# Patient Record
Sex: Female | Born: 1955
Health system: Southern US, Community
[De-identification: ages and names within clinical notes are randomized; demographics above are authoritative.]

## PROBLEM LIST (undated history)

## (undated) DIAGNOSIS — F329 Major depressive disorder, single episode, unspecified: Secondary | ICD-10-CM

## (undated) DIAGNOSIS — J189 Pneumonia, unspecified organism: Secondary | ICD-10-CM

## (undated) DIAGNOSIS — G629 Polyneuropathy, unspecified: Secondary | ICD-10-CM

## (undated) DIAGNOSIS — K219 Gastro-esophageal reflux disease without esophagitis: Secondary | ICD-10-CM

## (undated) DIAGNOSIS — I10 Essential (primary) hypertension: Secondary | ICD-10-CM

## (undated) DIAGNOSIS — K859 Acute pancreatitis without necrosis or infection, unspecified: Secondary | ICD-10-CM

## (undated) DIAGNOSIS — G35D Multiple sclerosis, unspecified: Secondary | ICD-10-CM

## (undated) DIAGNOSIS — J324 Chronic pansinusitis: Secondary | ICD-10-CM

## (undated) DIAGNOSIS — M5126 Other intervertebral disc displacement, lumbar region: Secondary | ICD-10-CM

## (undated) DIAGNOSIS — R51 Headache: Secondary | ICD-10-CM

## (undated) DIAGNOSIS — G709 Myoneural disorder, unspecified: Secondary | ICD-10-CM

## (undated) DIAGNOSIS — H539 Unspecified visual disturbance: Secondary | ICD-10-CM

## (undated) DIAGNOSIS — D649 Anemia, unspecified: Secondary | ICD-10-CM

## (undated) DIAGNOSIS — M1990 Unspecified osteoarthritis, unspecified site: Secondary | ICD-10-CM

## (undated) DIAGNOSIS — F32A Depression, unspecified: Secondary | ICD-10-CM

## (undated) DIAGNOSIS — F419 Anxiety disorder, unspecified: Secondary | ICD-10-CM

## (undated) DIAGNOSIS — K635 Polyp of colon: Secondary | ICD-10-CM

## (undated) DIAGNOSIS — Z01419 Encounter for gynecological examination (general) (routine) without abnormal findings: Secondary | ICD-10-CM

## (undated) DIAGNOSIS — N319 Neuromuscular dysfunction of bladder, unspecified: Secondary | ICD-10-CM

## (undated) DIAGNOSIS — E042 Nontoxic multinodular goiter: Secondary | ICD-10-CM

## (undated) DIAGNOSIS — K5120 Ulcerative (chronic) proctitis without complications: Secondary | ICD-10-CM

## (undated) DIAGNOSIS — N3281 Overactive bladder: Secondary | ICD-10-CM

## (undated) DIAGNOSIS — G35 Multiple sclerosis: Secondary | ICD-10-CM

## (undated) HISTORY — DX: Encounter for gynecological examination (general) (routine) without abnormal findings: Z01.419

## (undated) HISTORY — DX: Other intervertebral disc displacement, lumbar region: M51.26

## (undated) HISTORY — DX: Unspecified visual disturbance: H53.9

## (undated) HISTORY — DX: Major depressive disorder, single episode, unspecified: F32.9

## (undated) HISTORY — PX: SPINAL FUSION: SHX223

## (undated) HISTORY — DX: Polyp of colon: K63.5

## (undated) HISTORY — DX: Nontoxic multinodular goiter: E04.2

## (undated) HISTORY — DX: Depression, unspecified: F32.A

## (undated) HISTORY — PX: LAPAROSCOPY: SHX197

## (undated) HISTORY — PX: TONSILLECTOMY: SUR1361

## (undated) HISTORY — PX: NASAL TURBINATE REDUCTION: SHX2072

## (undated) HISTORY — DX: Ulcerative (chronic) proctitis without complications: K51.20

## (undated) HISTORY — PX: APPENDECTOMY: SHX54

## (undated) HISTORY — DX: Overactive bladder: N32.81

## (undated) HISTORY — DX: Multiple sclerosis, unspecified: G35.D

## (undated) HISTORY — DX: Neuromuscular dysfunction of bladder, unspecified: N31.9

## (undated) HISTORY — PX: CARPAL TUNNEL RELEASE: SHX101

## (undated) HISTORY — PX: OTHER SURGICAL HISTORY: SHX169

## (undated) HISTORY — PX: CHOLECYSTECTOMY: SHX55

## (undated) HISTORY — DX: Multiple sclerosis: G35

## (undated) HISTORY — DX: Headache: R51

## (undated) HISTORY — DX: Unspecified osteoarthritis, unspecified site: M19.90

## (undated) HISTORY — DX: Gastro-esophageal reflux disease without esophagitis: K21.9

## (undated) HISTORY — DX: Polyneuropathy, unspecified: G62.9

## (undated) HISTORY — DX: Acute pancreatitis without necrosis or infection, unspecified: K85.90

## (undated) HISTORY — PX: EYE SURGERY: SHX253

## (undated) HISTORY — DX: Essential (primary) hypertension: I10

## (undated) HISTORY — PX: DILATION AND CURETTAGE OF UTERUS: SHX78

---

## 1975-03-09 DIAGNOSIS — K859 Acute pancreatitis without necrosis or infection, unspecified: Secondary | ICD-10-CM

## 1975-03-09 HISTORY — DX: Acute pancreatitis without necrosis or infection, unspecified: K85.90

## 1976-03-08 HISTORY — PX: OTHER SURGICAL HISTORY: SHX169

## 1978-03-08 DIAGNOSIS — M51369 Other intervertebral disc degeneration, lumbar region without mention of lumbar back pain or lower extremity pain: Secondary | ICD-10-CM

## 1978-03-08 DIAGNOSIS — M5136 Other intervertebral disc degeneration, lumbar region: Secondary | ICD-10-CM

## 1978-03-08 DIAGNOSIS — M5126 Other intervertebral disc displacement, lumbar region: Secondary | ICD-10-CM

## 1978-03-08 HISTORY — DX: Other intervertebral disc degeneration, lumbar region: M51.36

## 1978-03-08 HISTORY — DX: Other intervertebral disc degeneration, lumbar region without mention of lumbar back pain or lower extremity pain: M51.369

## 1978-03-08 HISTORY — DX: Other intervertebral disc displacement, lumbar region: M51.26

## 1993-03-08 DIAGNOSIS — E042 Nontoxic multinodular goiter: Secondary | ICD-10-CM

## 1993-03-08 HISTORY — PX: VAGINAL HYSTERECTOMY: SUR661

## 1993-03-08 HISTORY — DX: Nontoxic multinodular goiter: E04.2

## 2000-05-07 ENCOUNTER — Encounter: Payer: Self-pay | Admitting: Psychiatry

## 2000-05-07 ENCOUNTER — Ambulatory Visit (HOSPITAL_COMMUNITY): Admission: RE | Admit: 2000-05-07 | Discharge: 2000-05-07 | Payer: Self-pay | Admitting: Psychiatry

## 2000-06-03 ENCOUNTER — Encounter: Admission: RE | Admit: 2000-06-03 | Discharge: 2000-06-28 | Payer: Self-pay | Admitting: Psychiatry

## 2000-10-22 ENCOUNTER — Ambulatory Visit (HOSPITAL_COMMUNITY): Admission: RE | Admit: 2000-10-22 | Discharge: 2000-10-22 | Payer: Self-pay | Admitting: Psychiatry

## 2000-10-22 ENCOUNTER — Encounter: Payer: Self-pay | Admitting: Psychiatry

## 2001-03-21 ENCOUNTER — Other Ambulatory Visit: Admission: RE | Admit: 2001-03-21 | Discharge: 2001-03-21 | Payer: Self-pay | Admitting: Family Medicine

## 2001-09-11 ENCOUNTER — Ambulatory Visit (HOSPITAL_COMMUNITY): Admission: RE | Admit: 2001-09-11 | Discharge: 2001-09-11 | Payer: Self-pay | Admitting: Psychiatry

## 2001-09-11 ENCOUNTER — Encounter: Payer: Self-pay | Admitting: Psychiatry

## 2001-10-13 ENCOUNTER — Encounter (INDEPENDENT_AMBULATORY_CARE_PROVIDER_SITE_OTHER): Payer: Self-pay | Admitting: Specialist

## 2001-10-13 ENCOUNTER — Ambulatory Visit (HOSPITAL_COMMUNITY): Admission: RE | Admit: 2001-10-13 | Discharge: 2001-10-13 | Payer: Self-pay | Admitting: *Deleted

## 2003-06-12 ENCOUNTER — Encounter: Admission: RE | Admit: 2003-06-12 | Discharge: 2003-07-25 | Payer: Self-pay | Admitting: Family Medicine

## 2003-10-07 HISTORY — PX: OTHER SURGICAL HISTORY: SHX169

## 2003-10-31 ENCOUNTER — Emergency Department (HOSPITAL_COMMUNITY): Admission: EM | Admit: 2003-10-31 | Discharge: 2003-10-31 | Payer: Self-pay | Admitting: Emergency Medicine

## 2003-10-31 ENCOUNTER — Ambulatory Visit (HOSPITAL_BASED_OUTPATIENT_CLINIC_OR_DEPARTMENT_OTHER): Admission: RE | Admit: 2003-10-31 | Discharge: 2003-10-31 | Payer: Self-pay | Admitting: Urology

## 2006-12-19 ENCOUNTER — Encounter: Admission: RE | Admit: 2006-12-19 | Discharge: 2006-12-19 | Payer: Self-pay | Admitting: Obstetrics and Gynecology

## 2006-12-19 IMAGING — MG MM SCREEN MAMMOGRAM BILATERAL
4 series · 4 of 4 positions shown · non-contrast
Comparison: none

DG SCREEN MAMMOGRAM BILATERAL
Bilateral CC and MLO view(s) were taken.

DIGITAL SCREENING MAMMOGRAM WITH CAD:
The breast tissue is heterogeneously dense.  No masses or malignant type calcifications are 
identified.  Compared with prior studies.

[R CC]
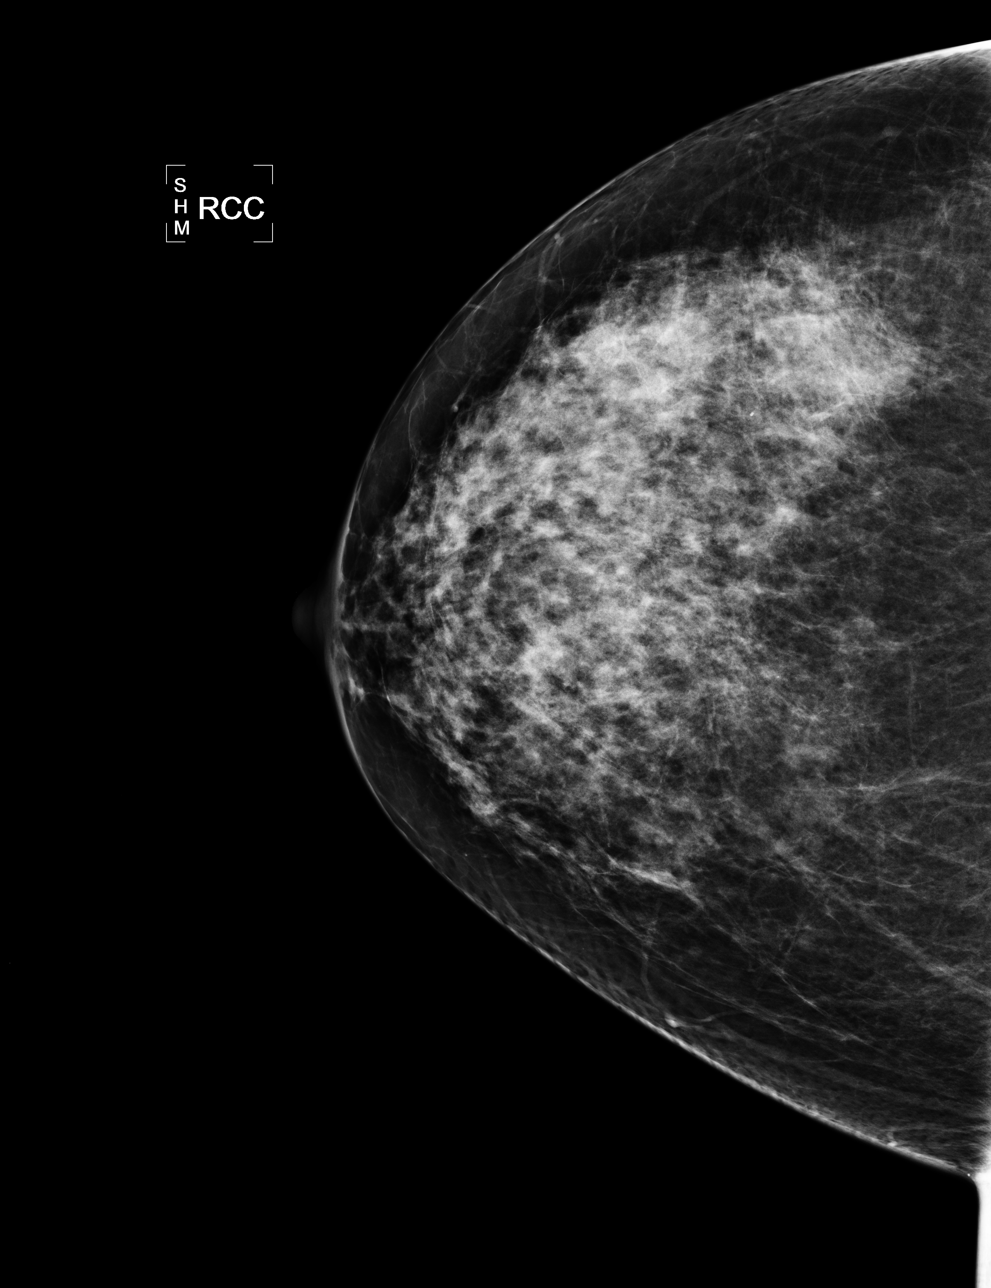

[L CC]
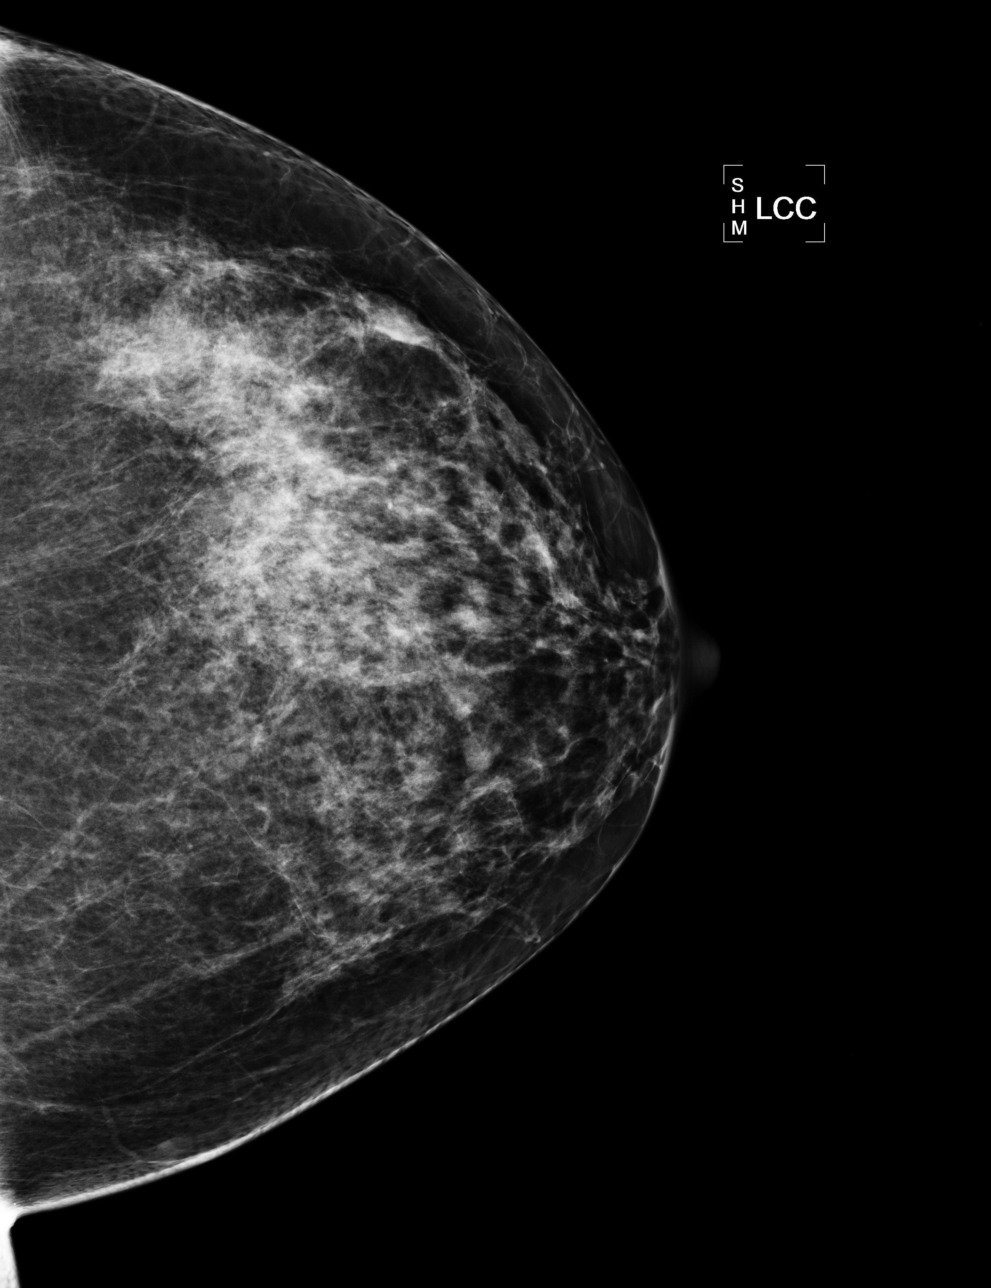

[L MLO]
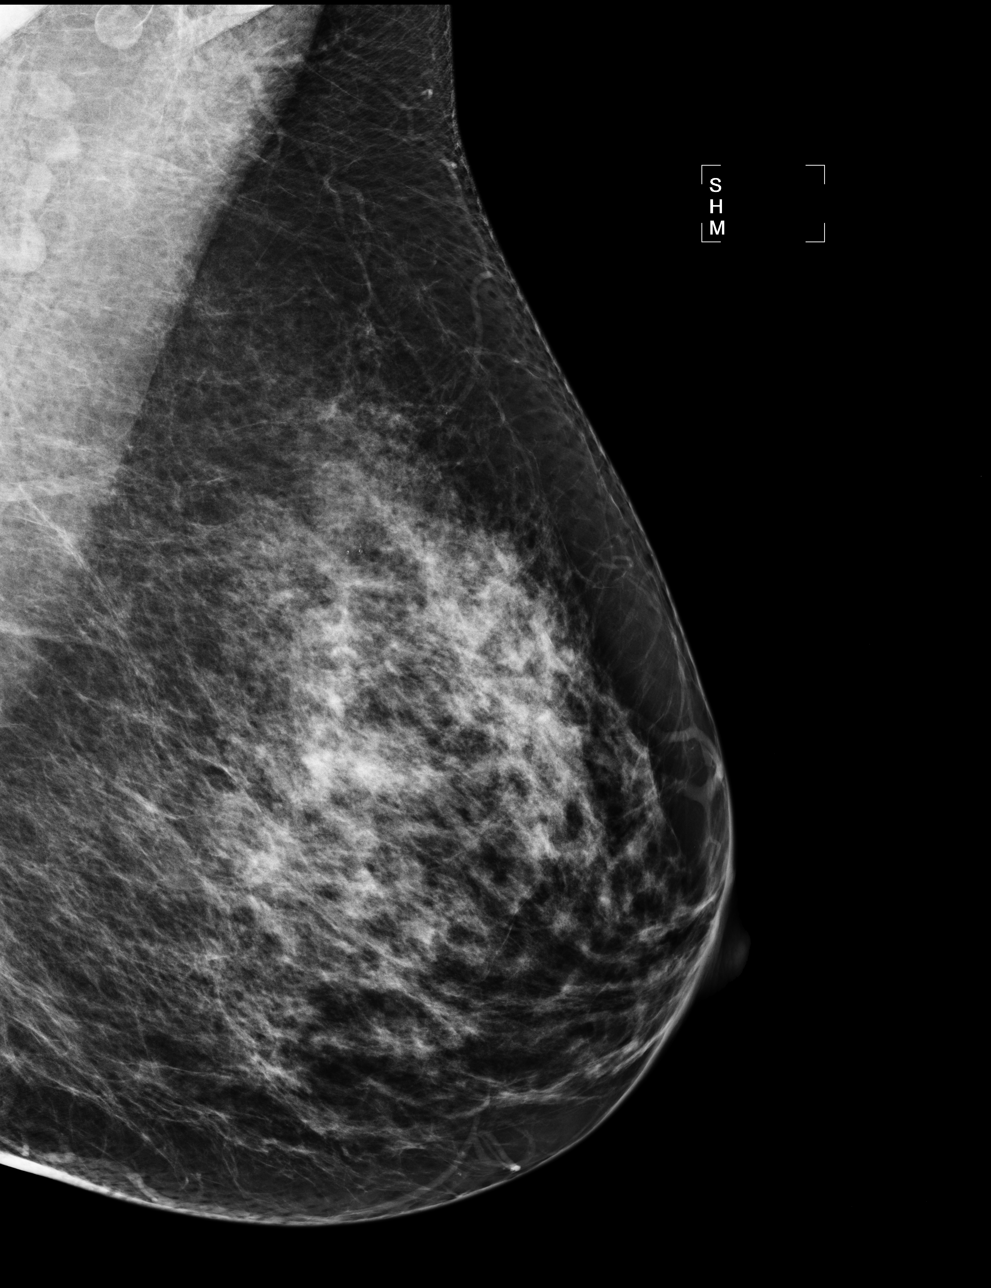

[R MLO]
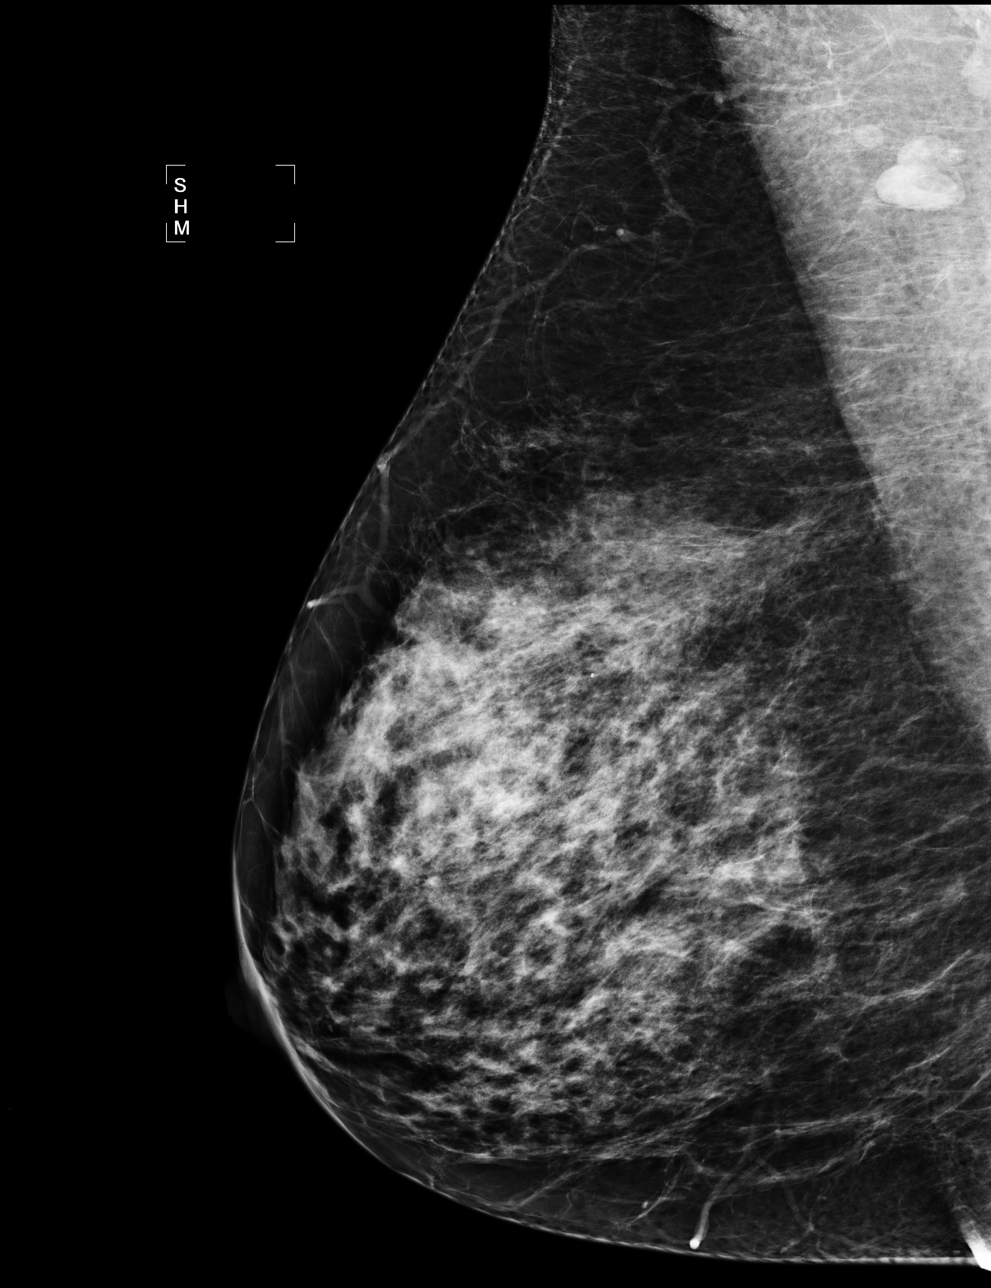

[4 of 4 positions shown; findings below may reference images not displayed]

IMPRESSION: No specific mammographic evidence of malignancy.  Next screening mammogram is recommended in one 
year.

ASSESSMENT: Negative - BI-RADS 1

Screening mammogram in 1 year.
ANALYZED BY COMPUTER AIDED DETECTION. , THIS PROCEDURE WAS A DIGITAL MAMMOGRAM.

## 2007-06-28 ENCOUNTER — Encounter: Payer: Self-pay | Admitting: Family Medicine

## 2007-07-04 ENCOUNTER — Ambulatory Visit: Payer: Self-pay | Admitting: Family Medicine

## 2007-07-04 DIAGNOSIS — Z860100 Personal history of colon polyps, unspecified: Secondary | ICD-10-CM | POA: Insufficient documentation

## 2007-07-04 DIAGNOSIS — M1990 Unspecified osteoarthritis, unspecified site: Secondary | ICD-10-CM | POA: Insufficient documentation

## 2007-07-04 DIAGNOSIS — R519 Headache, unspecified: Secondary | ICD-10-CM | POA: Insufficient documentation

## 2007-07-04 DIAGNOSIS — R51 Headache: Secondary | ICD-10-CM | POA: Insufficient documentation

## 2007-07-04 DIAGNOSIS — G35 Multiple sclerosis: Secondary | ICD-10-CM | POA: Insufficient documentation

## 2007-07-04 DIAGNOSIS — F418 Other specified anxiety disorders: Secondary | ICD-10-CM | POA: Insufficient documentation

## 2007-07-04 DIAGNOSIS — M179 Osteoarthritis of knee, unspecified: Secondary | ICD-10-CM | POA: Insufficient documentation

## 2007-07-06 LAB — CONVERTED CEMR LAB
ALT: 19 units/L (ref 0–35)
AST: 25 units/L (ref 0–37)
Albumin: 4 g/dL (ref 3.5–5.2)
Alkaline Phosphatase: 67 units/L (ref 39–117)
BUN: 8 mg/dL (ref 6–23)
Bilirubin, Direct: 0.1 mg/dL (ref 0.0–0.3)
CO2: 30 meq/L (ref 19–32)
Calcium: 9.3 mg/dL (ref 8.4–10.5)
Cholesterol: 195 mg/dL (ref 0–200)
Creatinine, Ser: 0.8 mg/dL (ref 0.4–1.2)
Eosinophils Relative: 3.4 % (ref 0.0–5.0)
GFR calc Af Amer: 96 mL/min
GFR calc non Af Amer: 80 mL/min
HDL: 36.6 mg/dL — ABNORMAL LOW (ref >39.0)
MCV: 96.9 fL (ref 78.0–100.0)
Monocytes Relative: 31.2 % — ABNORMAL HIGH (ref 3.0–12.0)
Neutro Abs: 0.9 10*3/uL — ABNORMAL LOW (ref 1.4–7.7)
RDW: 11.6 % (ref 11.5–14.6)
Total Bilirubin: 0.6 mg/dL (ref 0.3–1.2)
Total Protein: 7.4 g/dL (ref 6.0–8.3)
Triglycerides: 118 mg/dL (ref 0–149)
VLDL: 24 mg/dL (ref 0–40)

## 2007-07-20 ENCOUNTER — Encounter: Payer: Self-pay | Admitting: Family Medicine

## 2007-08-28 ENCOUNTER — Encounter: Payer: Self-pay | Admitting: Family Medicine

## 2007-08-28 ENCOUNTER — Ambulatory Visit: Payer: Self-pay | Admitting: Internal Medicine

## 2008-01-26 ENCOUNTER — Encounter: Admission: RE | Admit: 2008-01-26 | Discharge: 2008-01-26 | Payer: Self-pay | Admitting: Family Medicine

## 2008-01-26 IMAGING — MG MM SCREEN MAMMOGRAM BILATERAL
4 series · 4 of 4 positions shown · non-contrast
Comparison: none

DG SCREEN MAMMOGRAM BILATERAL
Bilateral CC and MLO view(s) were taken.
Technologist: ALEQSI

DIGITAL SCREENING MAMMOGRAM WITH CAD:
The breast tissue is heterogeneously dense.  A possible mass is noted in the left breast.  Spot 
compression views and possibly sonography are recommended for further evaluation.  In the right 
breast, no masses or malignant type calcifications are identified.  Compared with prior studies.

[R CC]
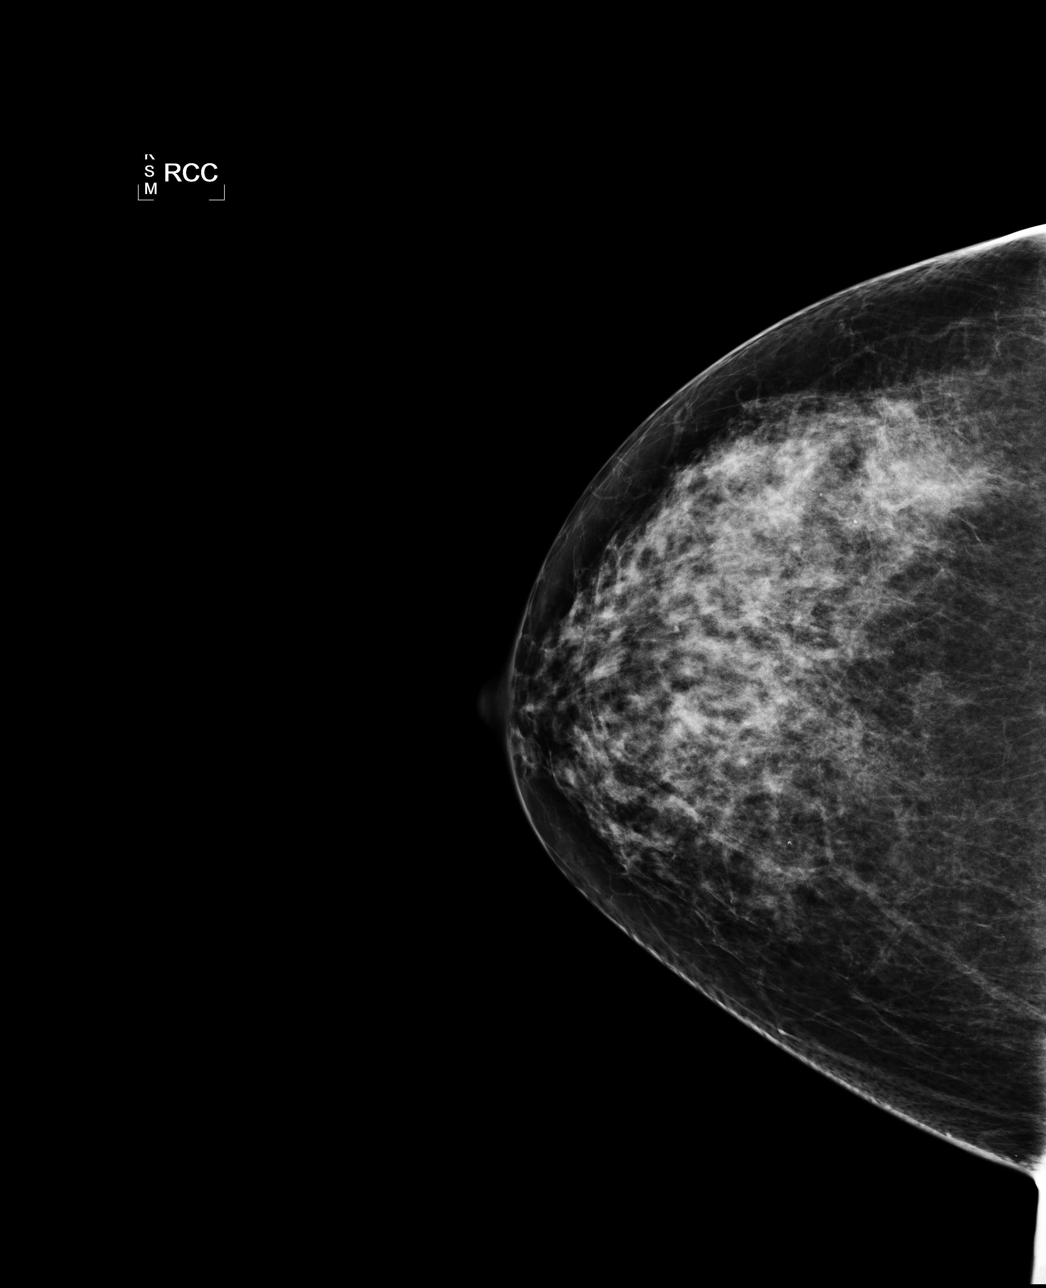

[L CC]
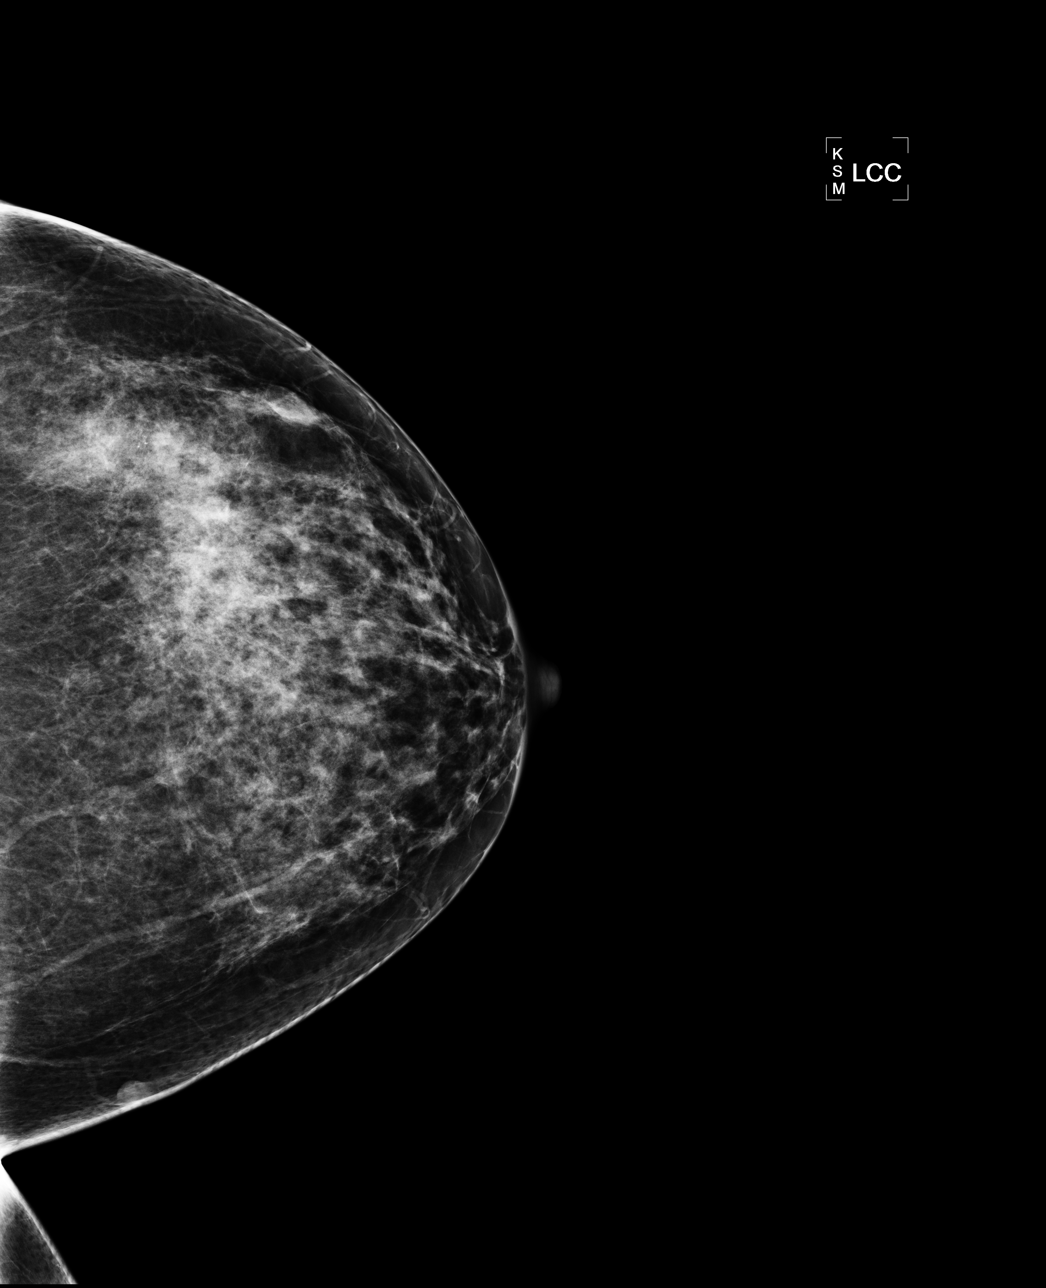

[L MLO]
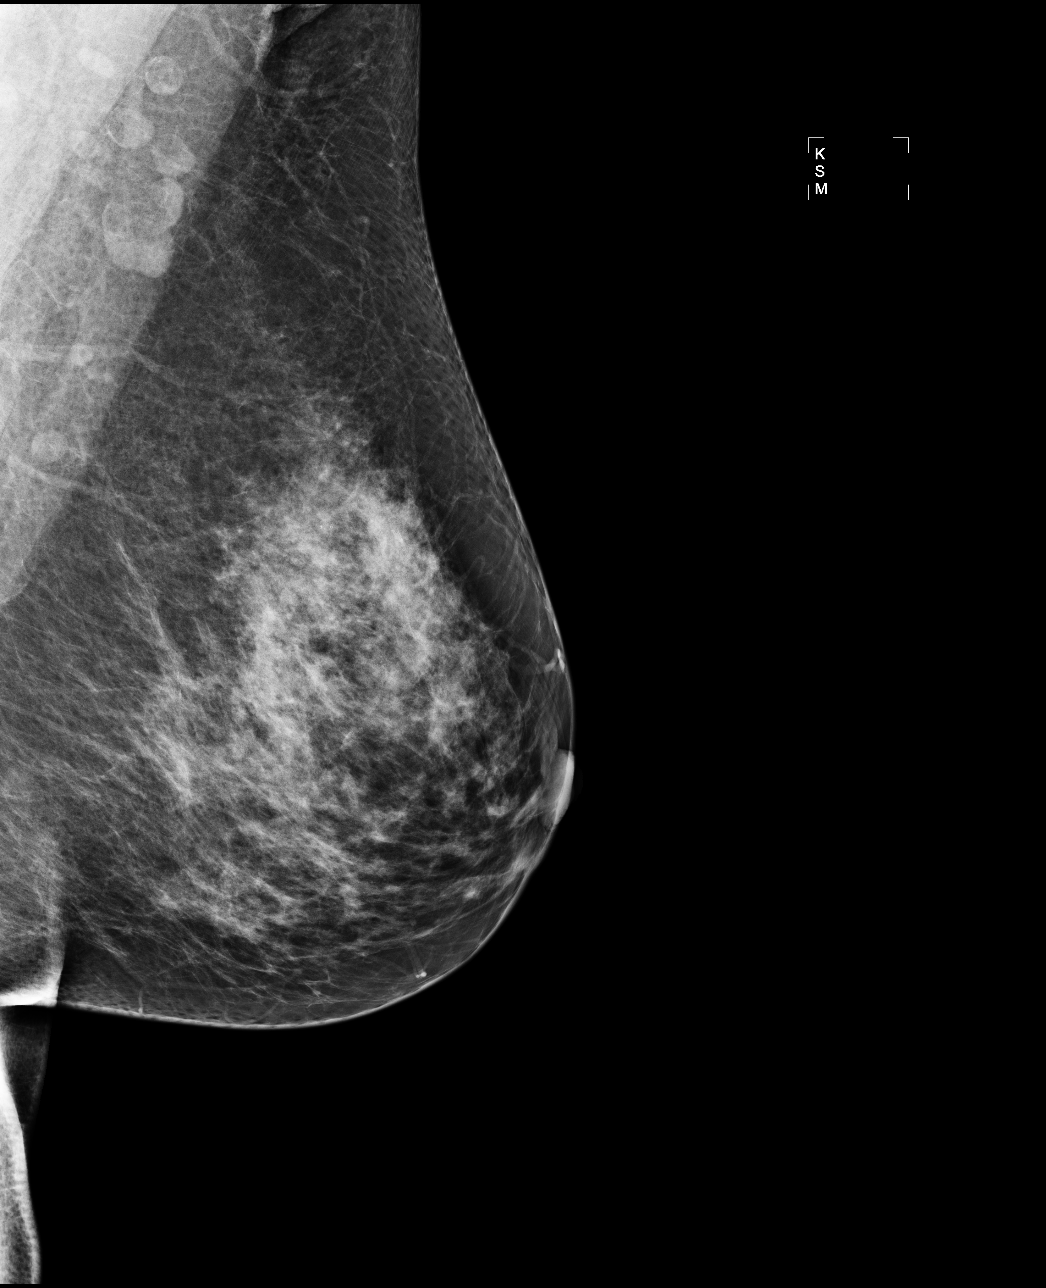

[R MLO]
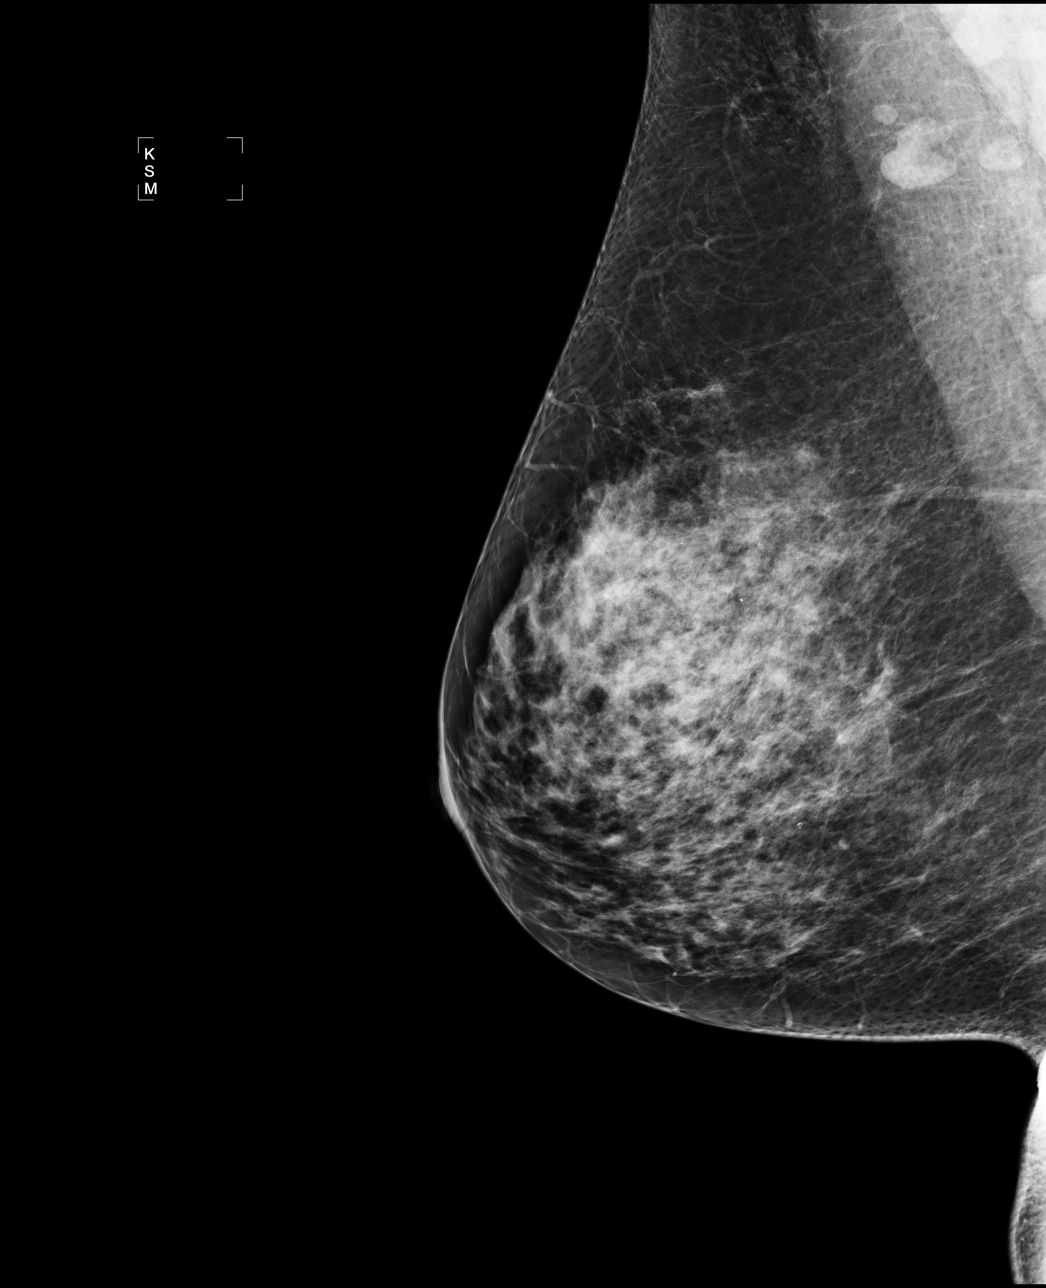

[4 of 4 positions shown; findings below may reference images not displayed]

IMPRESSION: Possible mass, left breast.  Additional evaluation is indicated.  The patient will be contacted for
additional studies and a supplementary report will follow.  No specific mammographic evidence of 
malignancy, right breast.

ASSESSMENT: Need additional imaging evaluation and/or prior mammograms for comparison - BI-RADS 0

Further imaging of the left breast.
ANALYZED BY COMPUTER AIDED DETECTION. , THIS PROCEDURE WAS A DIGITAL MAMMOGRAM.

## 2008-02-07 ENCOUNTER — Encounter: Admission: RE | Admit: 2008-02-07 | Discharge: 2008-02-07 | Payer: Self-pay | Admitting: Family Medicine

## 2008-02-07 IMAGING — MG MM DIGITAL DIAGNOSTIC LIMITED*L*
1 series · 1 of 1 positions shown · non-contrast
Comparison: Prior studies

CLINICAL DATA: Abnormal screening mammogram

DIGITAL DIAGNOSTIC LEFT BREAST MAMMOGRAM

[L TAN]
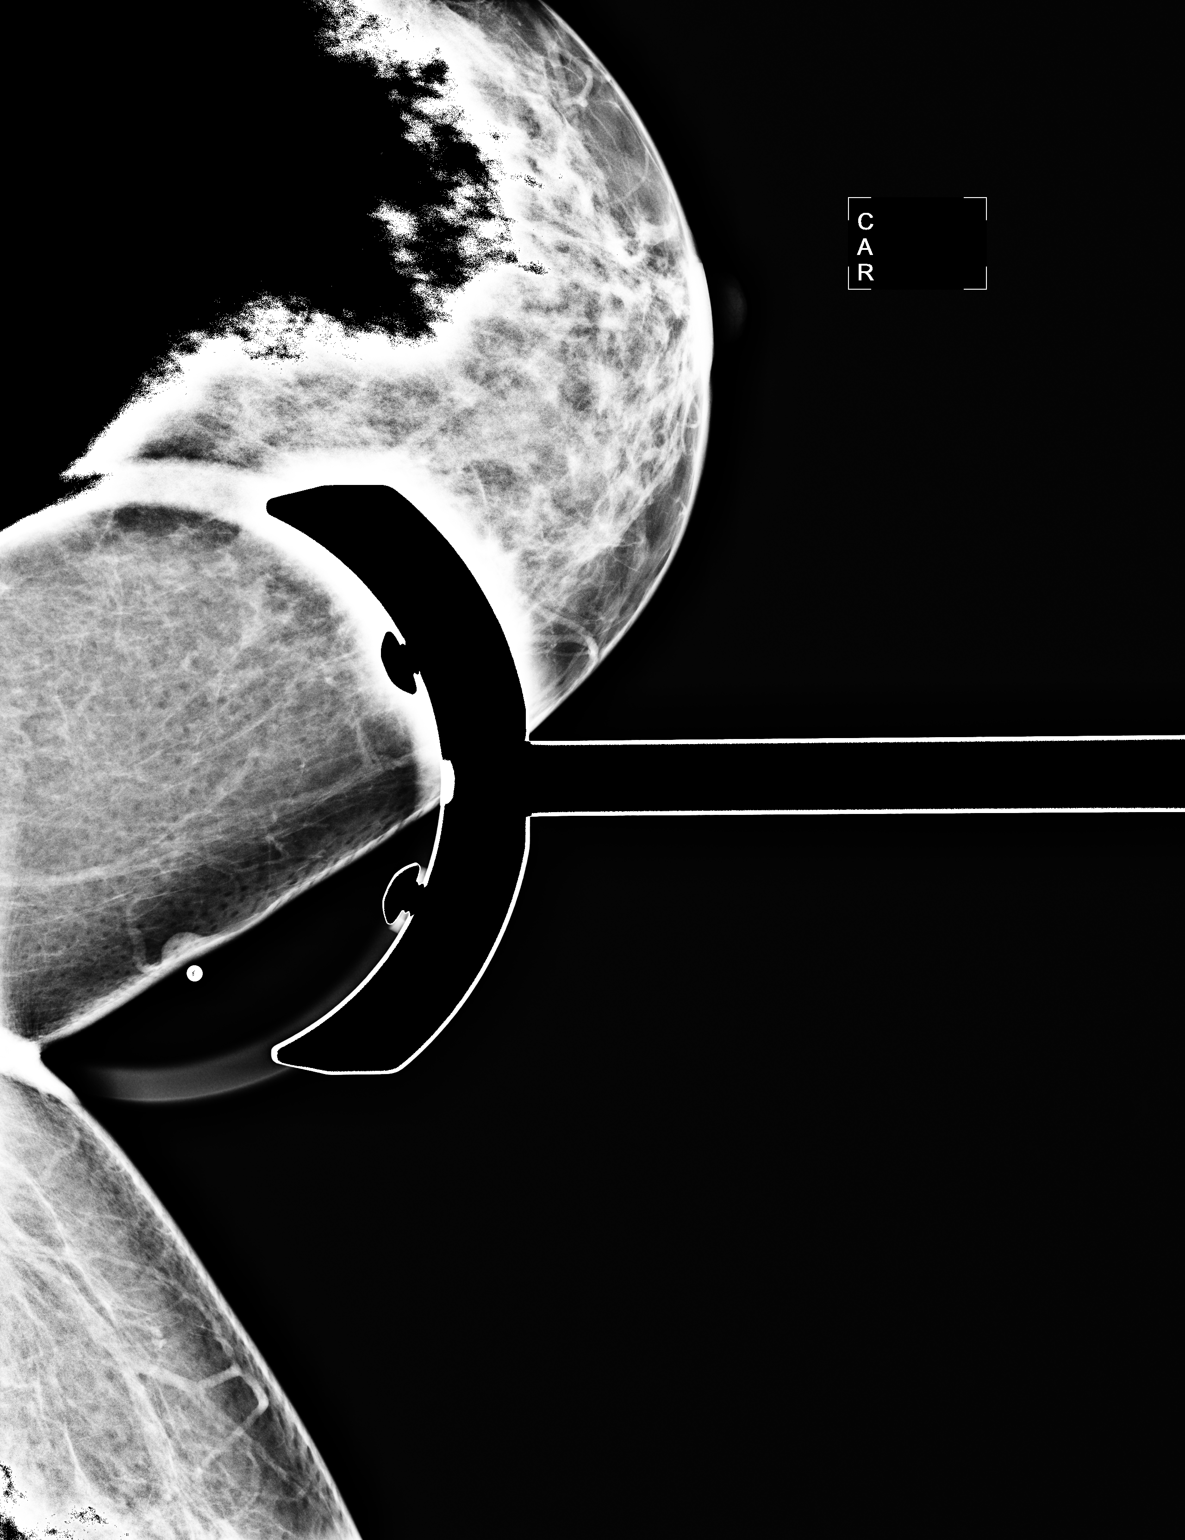

[1 of 1 positions shown; findings below may reference images not displayed]

FINDINGS: A tangential view of the medial left breast was obtained
with a "BB" marking the sebaceous cyst present on physical
examination.  This does demonstrate that the nodule noted medially
within the left breast on screening mammography corresponds to the
sebaceous cyst present on physical examination.  There are no
findings worrisome for malignancy.
IMPRESSION: The small nodular density located medially within the left breast
corresponds to a sebaceous cyst on physical examination.  No
findings worrisome for malignancy.  Recommend return to screening
mammography in 1 year.

BI-RADS CATEGORY 2:  Benign finding(s).

## 2008-02-19 ENCOUNTER — Telehealth: Payer: Self-pay | Admitting: Family Medicine

## 2008-12-19 ENCOUNTER — Ambulatory Visit: Payer: Self-pay | Admitting: Family Medicine

## 2009-01-10 ENCOUNTER — Encounter (INDEPENDENT_AMBULATORY_CARE_PROVIDER_SITE_OTHER): Payer: Self-pay | Admitting: *Deleted

## 2009-01-10 HISTORY — PX: COLONOSCOPY: SHX174

## 2009-02-07 ENCOUNTER — Encounter: Admission: RE | Admit: 2009-02-07 | Discharge: 2009-02-07 | Payer: Self-pay | Admitting: Family Medicine

## 2009-02-07 IMAGING — MG MM DIGITAL SCREENING BILAT W/ CAD
4 series · 4 of 4 positions shown · non-contrast
Comparison: Prior studies.

DG SCREEN MAMMOGRAM BILATERAL
Bilateral CC and MLO view(s) were taken.

DIGITAL SCREENING MAMMOGRAM WITH CAD:

[R CC]
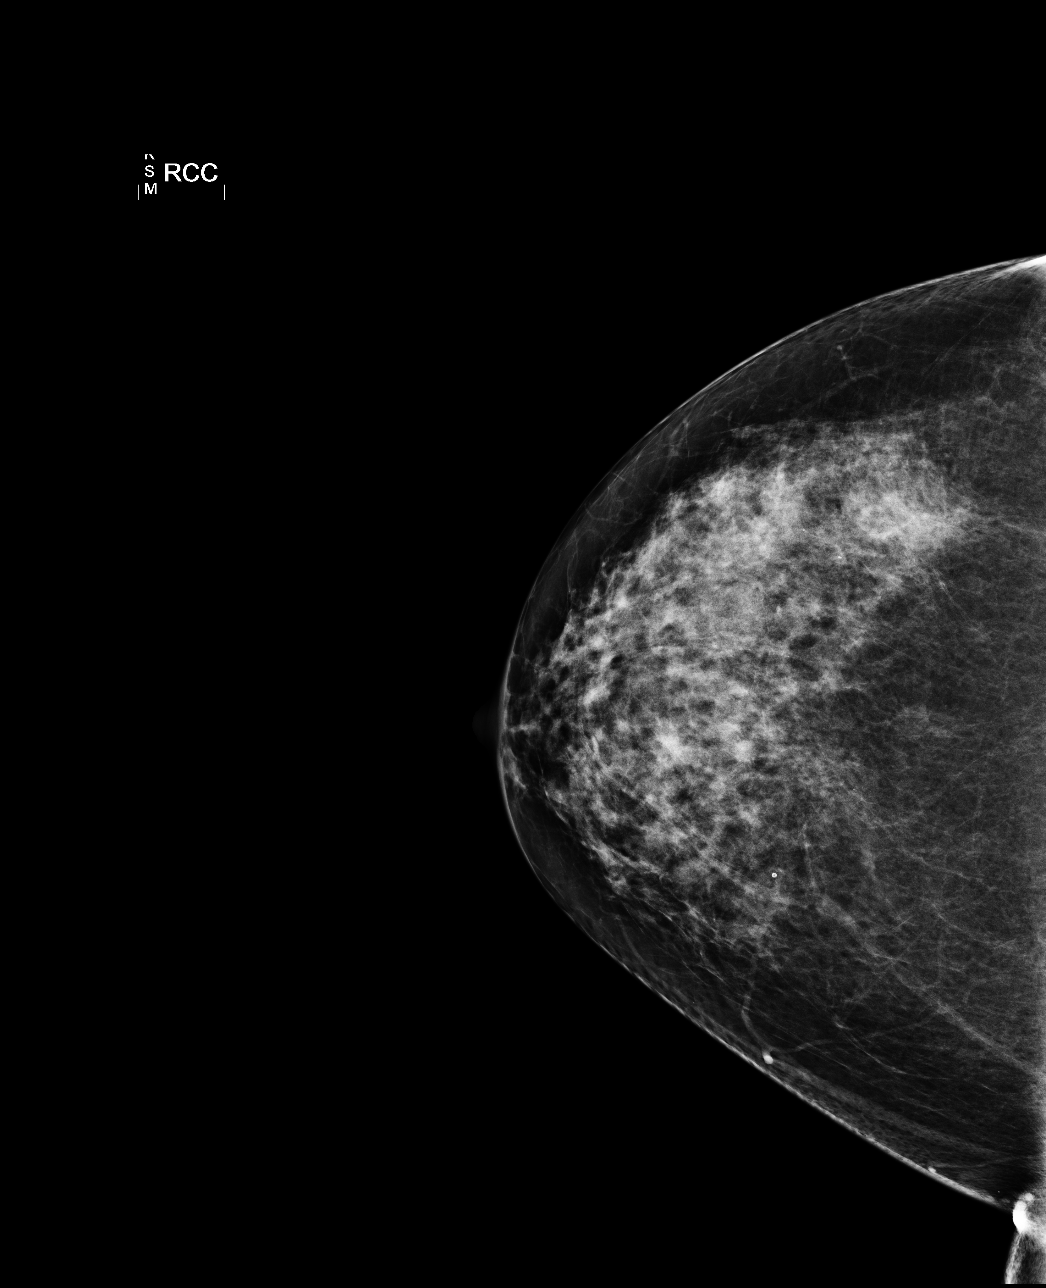

[L CC]
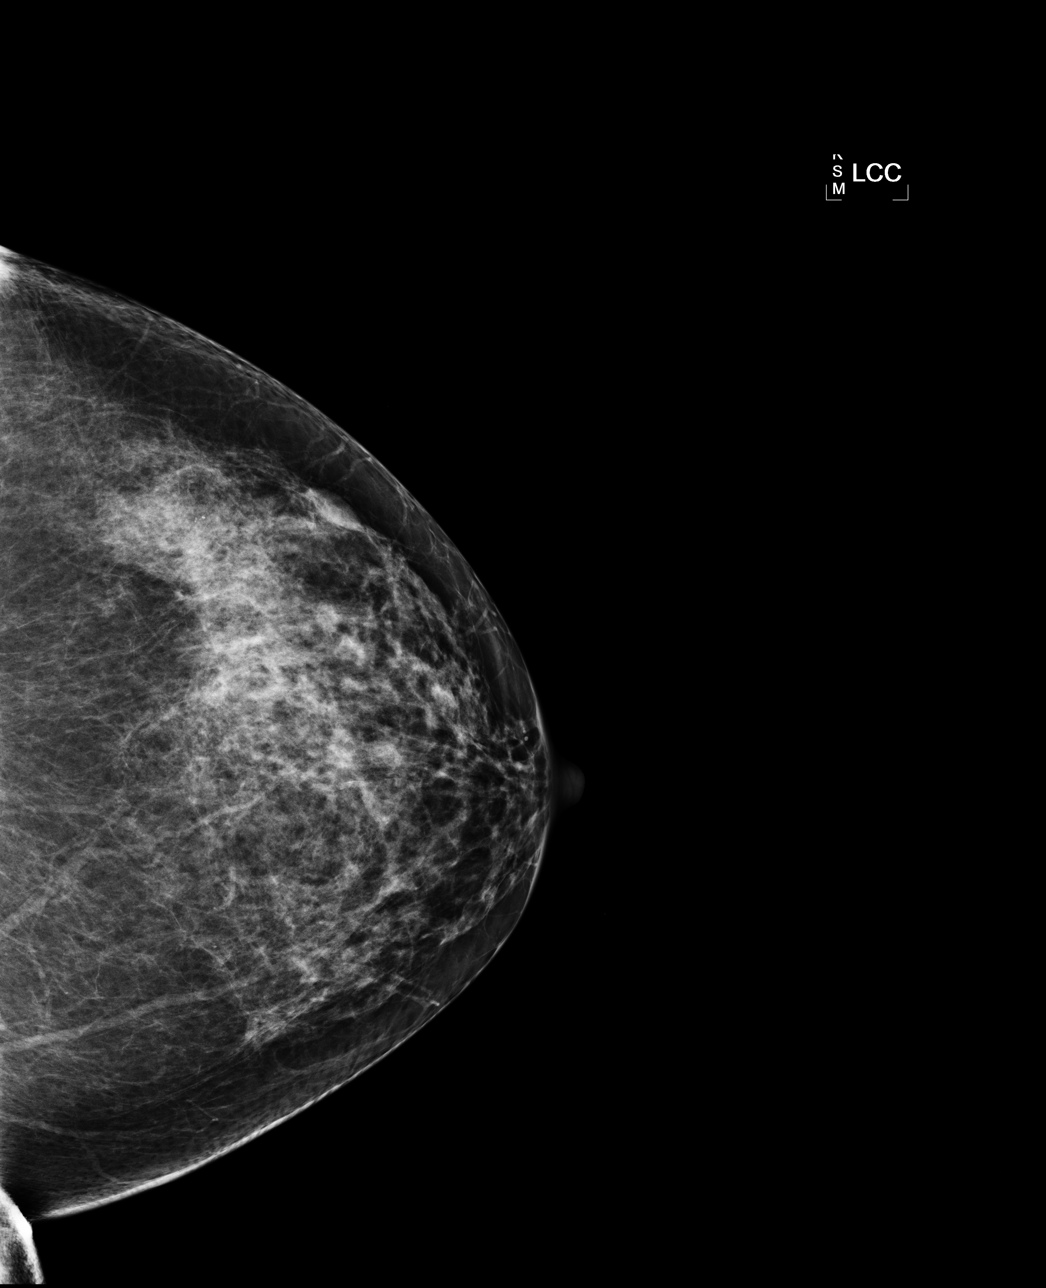

[L MLO]
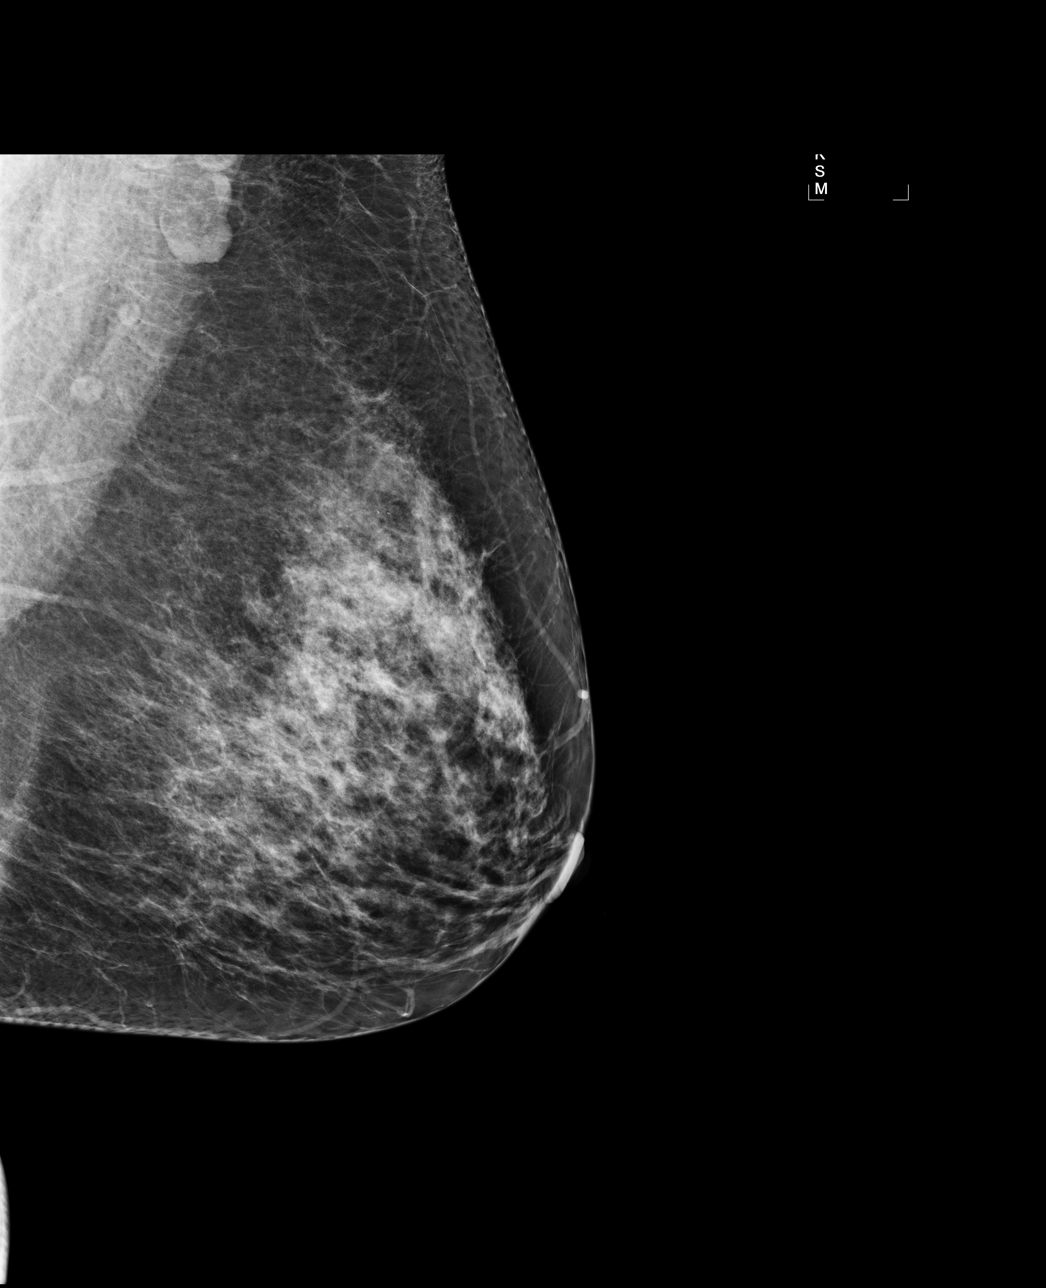

[R MLO]
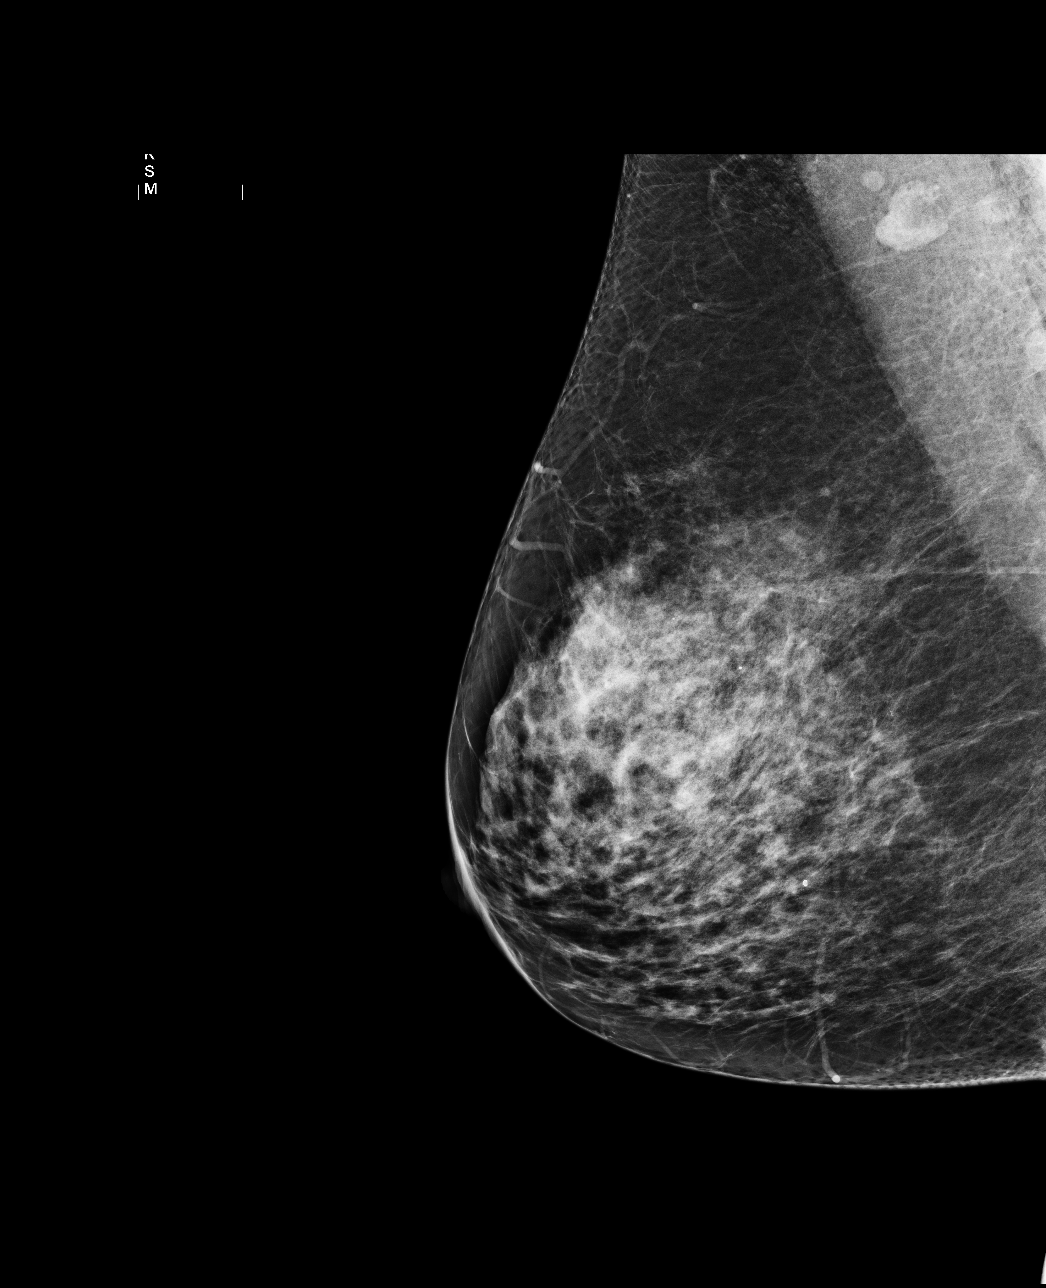

[4 of 4 positions shown; findings below may reference images not displayed]

The breast tissue is heterogeneously dense.  There is no dominant mass, architectural distortion or
calcification to suggest malignancy.

Images were processed with CAD.
IMPRESSION: No mammographic evidence of malignancy.  Suggest yearly screening mammography.

A result letter of this screening mammogram will be mailed directly to the patient.

ASSESSMENT: Negative - BI-RADS 1

Screening mammogram in 1 year.
,

## 2009-05-26 ENCOUNTER — Encounter: Payer: Self-pay | Admitting: Family Medicine

## 2009-08-01 ENCOUNTER — Ambulatory Visit: Payer: Self-pay | Admitting: Family Medicine

## 2009-08-01 DIAGNOSIS — M810 Age-related osteoporosis without current pathological fracture: Secondary | ICD-10-CM | POA: Insufficient documentation

## 2009-08-01 DIAGNOSIS — N318 Other neuromuscular dysfunction of bladder: Secondary | ICD-10-CM | POA: Insufficient documentation

## 2009-08-07 ENCOUNTER — Ambulatory Visit: Payer: Self-pay | Admitting: Family Medicine

## 2009-08-08 LAB — CONVERTED CEMR LAB
ALT: 15 units/L (ref 0–35)
AST: 23 units/L (ref 0–37)
Albumin: 4.1 g/dL (ref 3.5–5.2)
Basophils Absolute: 0 10*3/uL (ref 0.0–0.1)
Calcium: 9.3 mg/dL (ref 8.4–10.5)
Cholesterol: 221 mg/dL — ABNORMAL HIGH (ref 0–200)
Creatinine, Ser: 0.8 mg/dL (ref 0.4–1.2)
Eosinophils Absolute: 0.3 10*3/uL (ref 0.0–0.7)
Glucose, Urine, Semiquant: NEGATIVE
Lymphs Abs: 2.8 10*3/uL (ref 0.7–4.0)
Monocytes Absolute: 0.6 10*3/uL (ref 0.1–1.0)
Nitrite: POSITIVE
Potassium: 5 meq/L (ref 3.5–5.1)
RDW: 12.4 % (ref 11.5–14.6)
Specific Gravity, Urine: <1.005
Total Bilirubin: 0.6 mg/dL (ref 0.3–1.2)
Triglycerides: 159 mg/dL — ABNORMAL HIGH (ref 0.0–149.0)
Urobilinogen, UA: 0.2
WBC: 7.2 10*3/uL (ref 4.5–10.5)
pH: 6

## 2009-08-29 ENCOUNTER — Encounter: Admission: RE | Admit: 2009-08-29 | Discharge: 2009-08-29 | Payer: Self-pay | Admitting: Obstetrics and Gynecology

## 2009-12-06 DIAGNOSIS — N319 Neuromuscular dysfunction of bladder, unspecified: Secondary | ICD-10-CM

## 2009-12-06 HISTORY — DX: Neuromuscular dysfunction of bladder, unspecified: N31.9

## 2010-01-06 ENCOUNTER — Ambulatory Visit: Payer: Self-pay | Admitting: Family Medicine

## 2010-02-11 ENCOUNTER — Encounter
Admission: RE | Admit: 2010-02-11 | Discharge: 2010-02-11 | Payer: Self-pay | Source: Home / Self Care | Admitting: Family Medicine

## 2010-02-11 IMAGING — MG MM DIGITAL SCREENING
4 series · 4 of 4 positions shown · non-contrast
Comparison: none

DG SCREEN MAMMOGRAM BILATERAL
Bilateral CC and MLO view(s) were taken.

DIGITAL SCREENING MAMMOGRAM WITH CAD:
The breast tissue is heterogeneously dense.  No masses or malignant type calcifications are 
identified.  Compared with prior studies.
Images were processed with CAD.

[R CC]
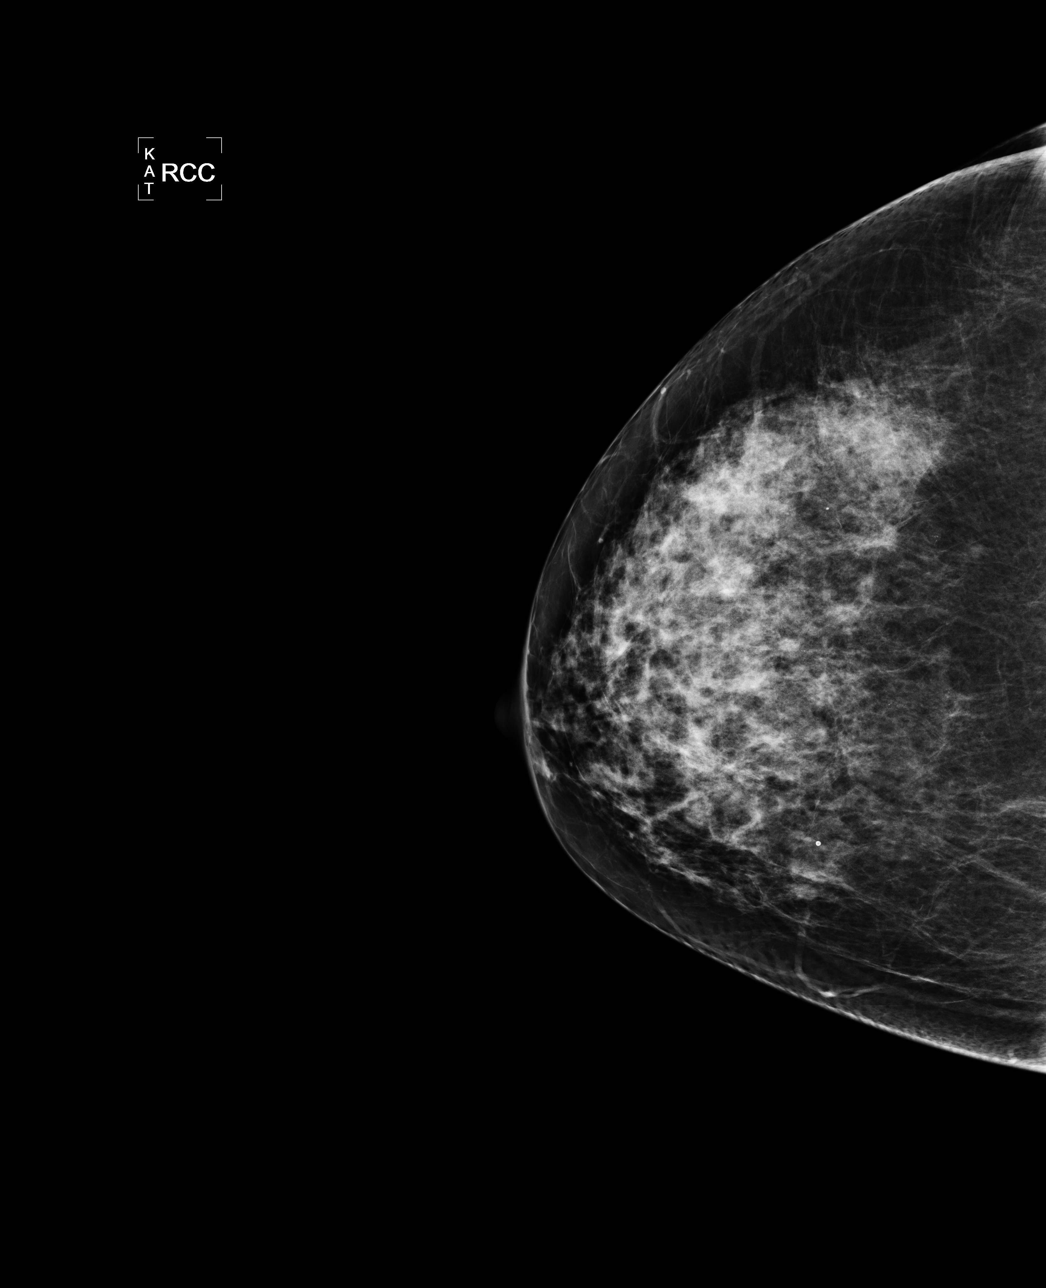

[L CC]
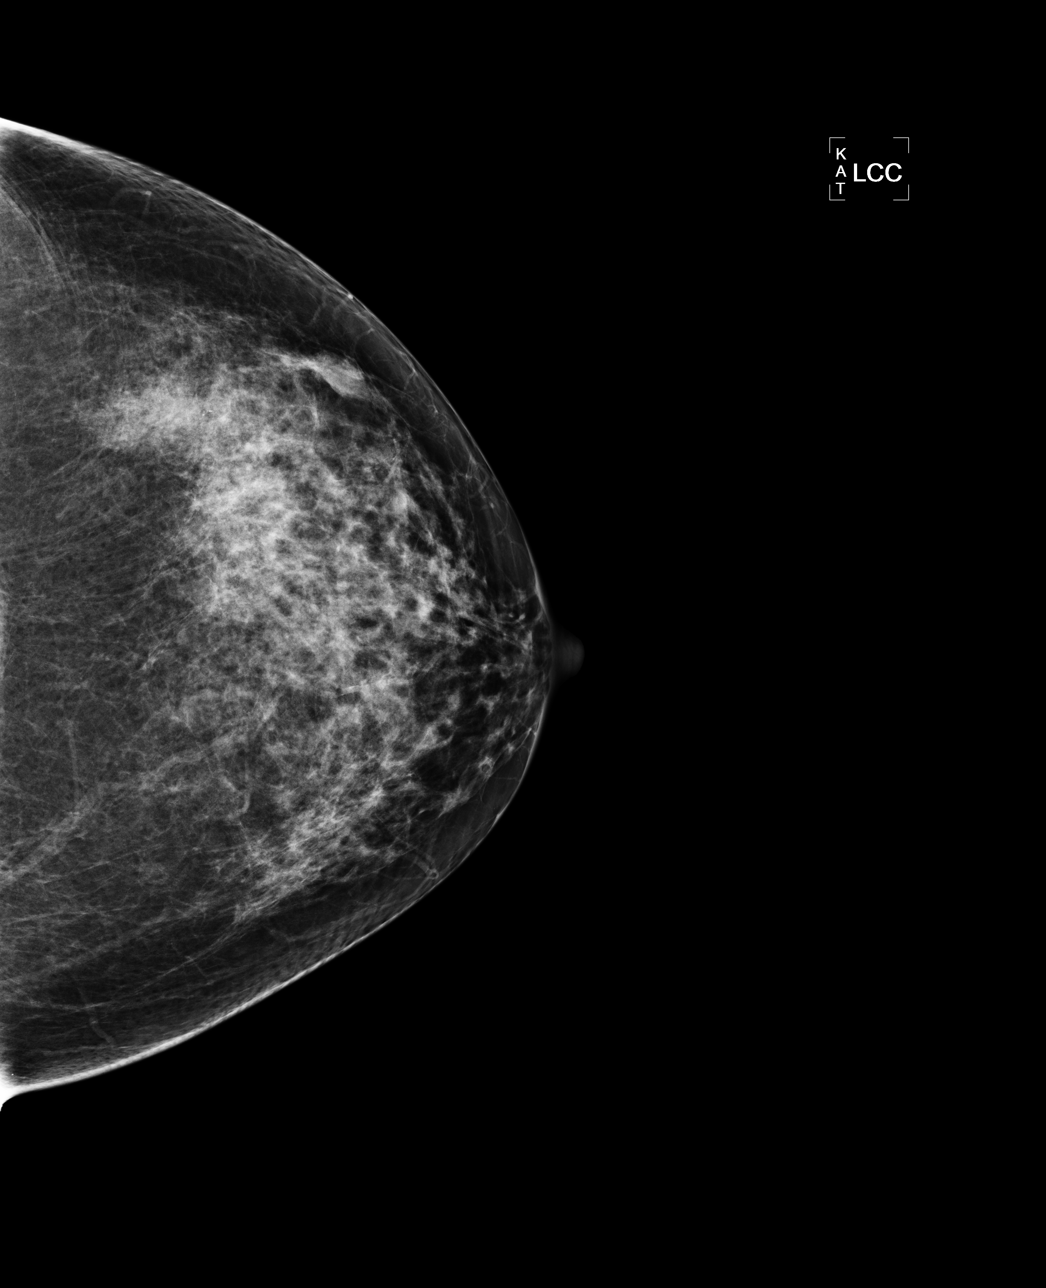

[L MLO]
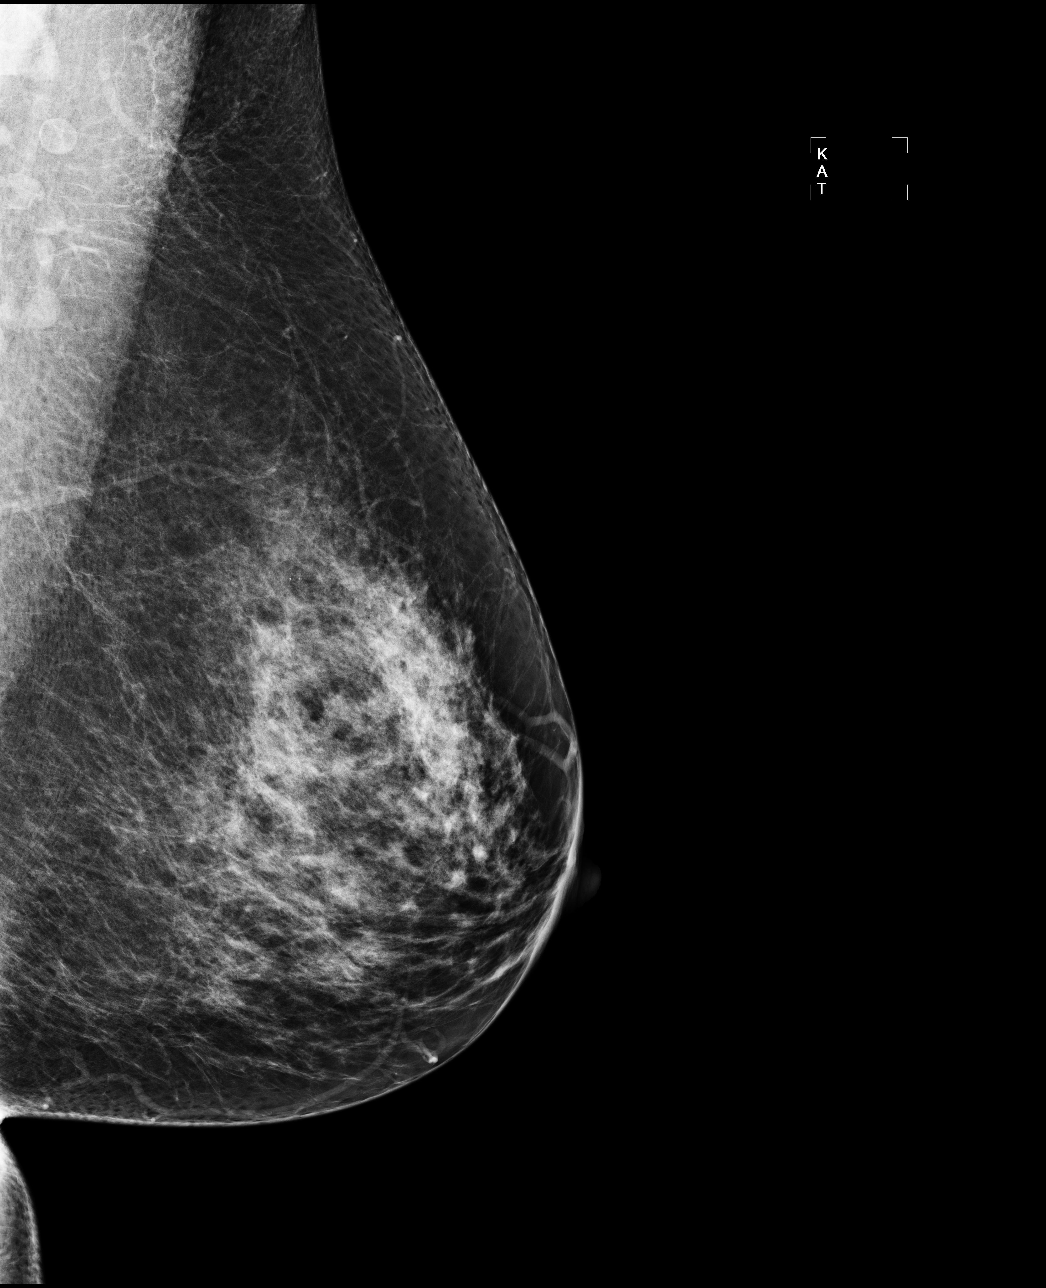

[R MLO]
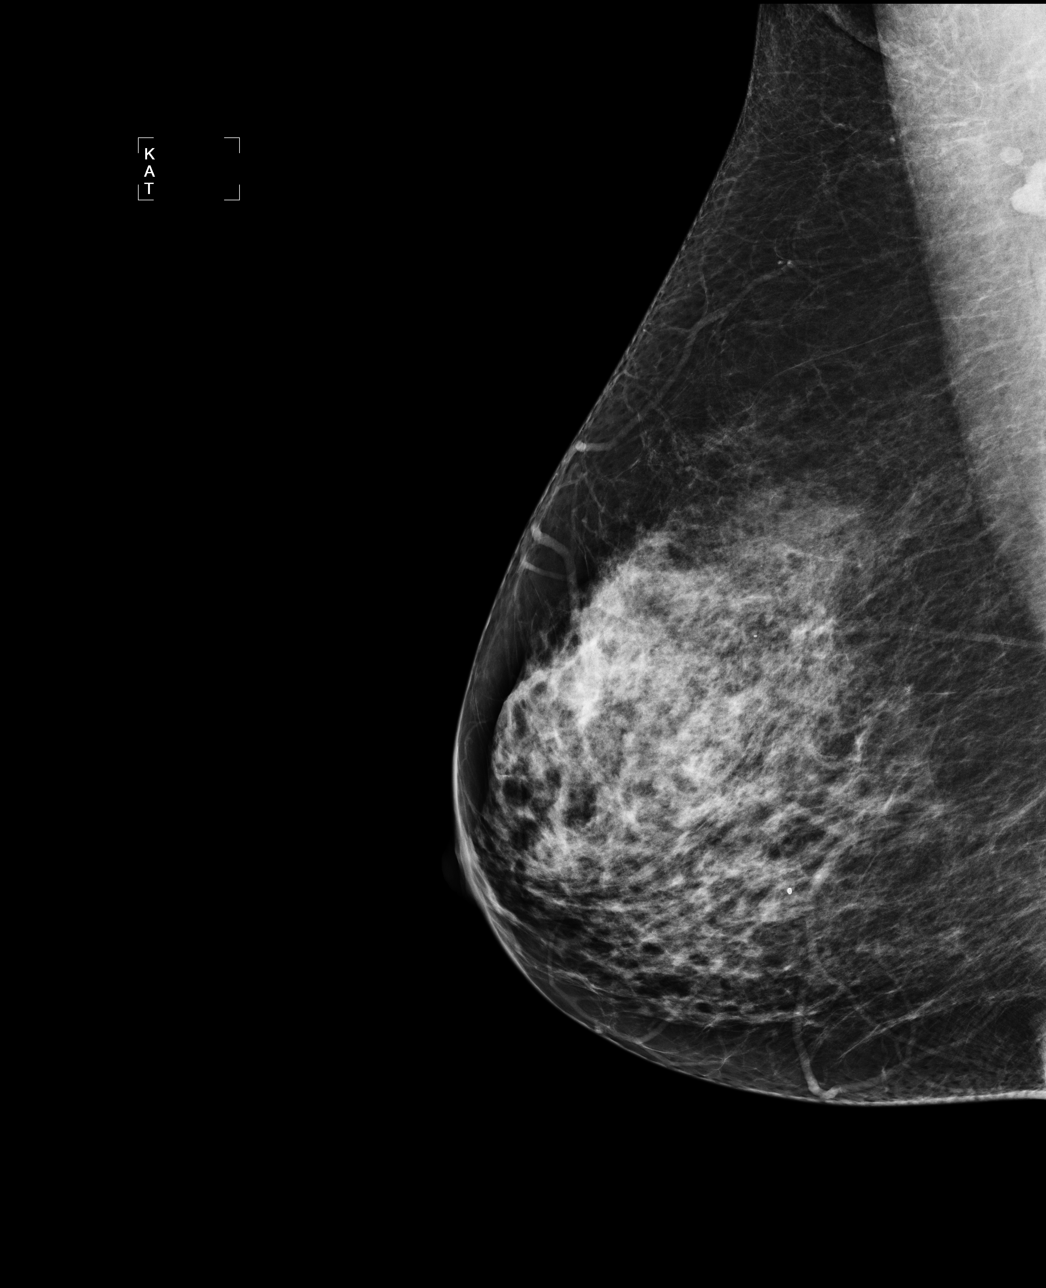

[4 of 4 positions shown; findings below may reference images not displayed]

IMPRESSION: No specific mammographic evidence of malignancy.  Next screening mammogram is recommended in one 
year.

A result letter of this screening mammogram will be mailed directly to the patient.

ASSESSMENT: Negative - BI-RADS 1

Screening mammogram in 1 year.
,

## 2010-03-28 ENCOUNTER — Encounter: Payer: Self-pay | Admitting: Neurology

## 2010-04-07 NOTE — Assessment & Plan Note (Signed)
Summary: flu shot//ccm/pt rsc/cjr---PT [facility] // RS  Nurse Visit   Review of Systems       Flu Vaccine Consent Questions     Do you have a history of severe allergic reactions to this vaccine? no    Any prior history of allergic reactions to egg and/or gelatin? no    Do you have a sensitivity to the preservative Thimersol? no    Do you have a past history of Guillan-Barre Syndrome? no    Do you currently have an acute febrile illness? no    Have you ever had a severe reaction to latex? no    Vaccine information given and explained to patient? yes    Are you currently pregnant? no    Lot Number:[id number]   Exp Date:09/05/2010   Site Given  Left Deltoid IM Pura Spice, RN  January 06, 2010 2:48 PM    Allergies: 1)  ! Sulfa 2)  ! Penicillin V Potassium (Penicillin V Potassium)  Orders Added: 1)  Admin 1st Vaccine [90471] 2)  Flu Vaccine 66yrs + [16109]

## 2010-04-07 NOTE — Assessment & Plan Note (Signed)
Summary: med check//ccm   Vital Signs:  Patient profile:   55 year old female Weight:      132 pounds BMI:     21.06 BP sitting:   94 / 72  (left arm) Cuff size:   regular  Vitals Entered By: Raechel Ache, RN (Aug 01, 2009 8:39 AM) CC: ROV.   History of Present Illness: Here to follow up on osteoporosis, among other issues. She feels good in  general, although she always has trouble tolerating the heat this time of year. No other changes. She still sees her neurologist regularly for the MS.  Allergies: No Known Drug Allergies  Past History:  Past Medical History: Colonic polyps, hx of Headache, migraines Osteoarthritis Anxiety multiple sclerosis since 1992, sees Dr. Despina Arias in [facility]  ulcerative proctitis, sees Dr. Charlott Rakes sees Dr. Meredeth Ide for gyn exams DEXA on 08-28-07  showed osteoporosis overactive bladder, sees Dr. Roselee Nova  Past Surgical History: Tonsillectomy Wisdom  teeth D&C Lt knee surgery to remove bone tumor 1978 Appendectomy Cholecystectomy Laparoscopy x3 Hysterectomy, vaginal Nasal reduction left breast ruptured ductal cyst anterior vaginal cystocele  repair colonoscopy 01-10-09  per Dr. Charlott Rakes, repeat in 5 yrs  Review of Systems  The patient denies anorexia, fever, weight loss, weight gain, vision loss, decreased hearing, hoarseness, chest pain, syncope, dyspnea on exertion, peripheral edema, prolonged cough, headaches, hemoptysis, abdominal pain, melena, hematochezia, severe indigestion/heartburn, hematuria, incontinence, genital sores, muscle weakness, suspicious skin lesions, transient blindness, difficulty walking, depression, unusual weight change, abnormal bleeding, enlarged lymph nodes, angioedema, breast masses, and testicular masses.    Physical Exam  General:  Well-developed,well-nourished,in no acute distress; alert,appropriate and cooperative throughout examination Neck:  No deformities,  masses, or tenderness noted. Lungs:  Normal respiratory effort, chest expands symmetrically. Lungs are clear to auscultation, no crackles or wheezes. Heart:  Normal rate and regular rhythm. S1 and S2 normal without gallop, murmur, click, rub or other extra sounds. Neurologic:  No cranial nerve deficits noted. Station and gait are normal. Plantar reflexes are down-going bilaterally. DTRs are symmetrical throughout. Sensory, motor and coordinative functions appear intact.   Impression & Recommendations:  Problem # 1:  OSTEOPOROSIS (ICD-733.00)  Her updated medication list for this problem includes:    Alendronate Sodium 35 Mg Tabs (Alendronate sodium) .Marland Kitchen... 1 by mouth q weekly    Vitamin D (ergocalciferol) 50000 Unit Caps (Ergocalciferol) .Marland Kitchen... 1 q week  Problem # 2:  OVERACTIVE BLADDER (ICD-596.51)  Problem # 3:  ANXIETY (ICD-300.00)  The following medications were removed from the medication list:    Budeprion Sr 150 Mg Tb12 (Bupropion hcl) .Marland Kitchen... 2 by mouth once daily    Klonopin 0.5 Mg Tabs (Clonazepam) .Marland Kitchen... At bedtime Her updated medication list for this problem includes:    Alprazolam 0.5 Mg Tabs (Alprazolam) .Marland Kitchen... 2 at bedtime    Imipramine Hcl 25 Mg Tabs (Imipramine hcl) .Marland Kitchen... 2 by mouth at bedtime    Klonopin 1 Mg Tabs (Clonazepam) .Marland Kitchen... 1 qid    Citalopram Hydrobromide 40 Mg Tabs (Citalopram hydrobromide) .Marland Kitchen... 1 once daily  Problem # 4:  MULTIPLE SCLEROSIS (ICD-340)  Problem # 5:  OSTEOARTHRITIS (ICD-715.90)  Her updated medication list for this problem includes:    Tramadol Hcl 50 Mg Tabs (Tramadol hcl) .Marland Kitchen... 1 by mouth qid    Hydrocodone-acetaminophen 7.5-650 Mg Tabs (Hydrocodone-acetaminophen) .Marland Kitchen... 1 q6h as needed  Complete Medication List: 1)  Tramadol Hcl 50 Mg Tabs (Tramadol hcl) .Marland Kitchen.. 1 by mouth qid  2)  Alprazolam 0.5 Mg Tabs (Alprazolam) .... 2 at bedtime 3)  Colace 100 Mg Caps (Docusate sodium) .... 2 at bedtime 4)  Imipramine Hcl 25 Mg Tabs (Imipramine hcl)  .... 2 by mouth at bedtime 5)  Maxalt-mlt 10 Mg Tbdp (Rizatriptan benzoate) .... As needed 6)  Alendronate Sodium 35 Mg Tabs (Alendronate sodium) .Marland Kitchen.. 1 by mouth q weekly 7)  Klonopin 1 Mg Tabs (Clonazepam) .Marland Kitchen.. 1 qid 8)  Etodolac 400mg   .... 1qid 9)  Adderall 10 Mg Tabs (Amphetamine-dextroamphetamine) .Marland Kitchen.. 1 two times a day 10)  Citalopram Hydrobromide 40 Mg Tabs (Citalopram hydrobromide) .Marland Kitchen.. 1 once daily 11)  Vitamin D (ergocalciferol) 50000 Unit Caps (Ergocalciferol) .Marland Kitchen.. 1 q week 12)  Hydrocodone-acetaminophen 7.5-650 Mg Tabs (Hydrocodone-acetaminophen) .Marland Kitchen.. 1 q6h as needed 13)  Toviaz 4 Mg Xr24h-tab (Fesoterodine fumarate) .Marland Kitchen.. 1 once daily  Patient Instructions: 1)  Will set up fasting labs soon. Prescriptions: ALENDRONATE SODIUM 35 MG TABS (ALENDRONATE SODIUM) 1 by mouth q weekly  #4 x 11   Entered and Authorized by:   Nelwyn Salisbury MD   Signed by:   Nelwyn Salisbury MD on 08/01/2009   Method used:   Electronically to        [facility]  [phone]* (retail)       [address] [place], [place]  [address]       Ph: [phone] or [phone]       Fax: [phone]   RxID:   [id number]

## 2010-06-18 ENCOUNTER — Ambulatory Visit: Payer: Medicare Other | Attending: Family Medicine

## 2010-06-18 DIAGNOSIS — IMO0001 Reserved for inherently not codable concepts without codable children: Secondary | ICD-10-CM | POA: Insufficient documentation

## 2010-06-18 DIAGNOSIS — M2560 Stiffness of unspecified joint, not elsewhere classified: Secondary | ICD-10-CM | POA: Insufficient documentation

## 2010-06-18 DIAGNOSIS — M2550 Pain in unspecified joint: Secondary | ICD-10-CM | POA: Insufficient documentation

## 2010-06-18 DIAGNOSIS — R5381 Other malaise: Secondary | ICD-10-CM | POA: Insufficient documentation

## 2010-06-22 ENCOUNTER — Ambulatory Visit: Payer: Medicare Other | Admitting: Rehabilitative and Restorative Service Providers"

## 2010-06-26 ENCOUNTER — Ambulatory Visit: Payer: Medicare Other | Admitting: Rehabilitative and Restorative Service Providers"

## 2010-06-30 ENCOUNTER — Encounter: Payer: Medicare Other | Admitting: Rehabilitative and Restorative Service Providers"

## 2010-07-02 ENCOUNTER — Encounter: Payer: Medicare Other | Admitting: Rehabilitative and Restorative Service Providers"

## 2010-07-09 ENCOUNTER — Encounter: Payer: Medicare Other | Admitting: Rehabilitative and Restorative Service Providers"

## 2010-07-24 NOTE — Op Note (Signed)
NAME:  Sheri Burns, Sheri Burns                       ACCOUNT NO.:  [id number]   MEDICAL RECORD NO.:  [id number]                   PATIENT TYPE:  AMB   LOCATION:  [facility]                                 FACILITY:  [facility]   PHYSICIAN:  Jamison Neighbor, M.D.               DATE OF BIRTH:  [birth year]   DATE OF PROCEDURE:  10/31/2003  DATE OF DISCHARGE:                                 OPERATIVE REPORT   PREOPERATIVE DIAGNOSES:  1. Cystocele.  2. Stress urinary incontinence.  3. Neurogenic bladder secondary to multiple sclerosis.   POSTOPERATIVE DIAGNOSES:  1. Cystocele.  2. Stress urinary incontinence.  3. Neurogenic bladder secondary to multiple sclerosis.   PROCEDURE:  1. Cystoscopy.  2. Suprapubic tube placement.  3. Anterior repair of cystocele including mesh.  4. Transobturator sling.   SURGEON:  Jamison Neighbor, M.D.   ANESTHESIA:  General.   COMPLICATIONS:  None.   DRAINS:  A Bonanno Cystocath.   BRIEF HISTORY:  This 55 year old female has a complicated urologic history.  She is known to have a cystocele which tends to cause her to have problems  getting her bladder to empty.  This is also complicated by the fact that she  does have known multiple sclerosis with some decrease in her bladder  contractility.  The patient underwent urodynamic testing which did  demonstrate that she could generate normal detrusor contractions and it was  noted that with the cystocele elevated, she got stress incontinence.  The  findings were discussed with the patient, and it was clear that she should  have a cystocele repair along with a stress incontinence repair, but she  clearly does run the risk of retention due to the MS.  The patient is aware  of the risks and benefits of the procedure and says that she is willing to  consider doing self-catheterization long-term if she could eliminate her  stress incontinence as well as the very heavy and uncomfortable sensation  she has with bladder  prolapse.  She gave full informed consent.   DESCRIPTION OF PROCEDURE:  After successful induction of general anesthesia,  the patient was placed in the dorsal lithotomy position, prepped with  Betadine, and draped in the usual sterile fashion.  Weighted vaginal  speculum was placed.  The anterior vaginal mucosa was infiltrated with local  anesthetic.  The patient clearly had a cystocele prolapsing out beyond the  introitus.  The anterior vaginal mucosa was then incised, and a very thick  flap of mucosa was elevated, extending back to but not through the  endopelvic fascia.  When this dissection had been developed all the way back  to the cardinal ligaments, it was clear that the patient needed to have the  cystocele reduced prior to the placement of the sling.  The patient had  three plication sutures performed in a mattress fashion with 2-0 Vicryl, and  this did nicely elevate the cystocele in  the midline.  The patient had stab  incisions made lateral to the clitoris at the top of the obturator fossa and  also at the bottom of the obturator fossa.  The appropriate needles were  then used to penetrate through the obturator fossa, coming out the vagina,  two at the top and two at the bottom.  The sling was then attached to the  needles, and the arms of the perigee device were pulled out through the  small stab incisions, placed in the mesh underneath the bladder.  The tail  of the mesh was then trimmed away somewhat in order to have it seat  appropriately.  This was tacked down to the cardinal ligaments.  The sling  was also tacked down up at the approximate level of the mid urethral  complex, thus covering over the entire repair.  In this way, the central  deficit as well as the lateral defect were repaired.  The device was  elevated so that the bladder was in a very neutral position.  Before the  suture material was removed, the bladder itself was inspected with the  cystoscope.  There  was no injury to the bladder.  It should also be noted  that there was no injury to the vaginal mucosa, and the sling had not been  advertently allowed to pierce through the lateral sulcus.  Attention was  then directed to the stress incontinence portion, and it was felt that the  patient had a very minimal cystocele.  The needles for the [facility] device  were then passed through the same small stab incisions, and this was then  positioned at the mid urethral complex.  This was set with tension, in  essence no tension on the sling, and there was space between the bottom of  the urethra and the sling itself.  The entire was irrigated.  Final  inspection with the cystoscope showed good placement, good support for the  urethra but no excessive angulation.  The cystoscope could easily be  inserted without having to angulate.  The bladder was filled, and a crede  maneuver did allow nice flow of fluid without spraying, and it was thought  that tension appeared to be normal.  The patient had a Bonanno Cystocath  inserted through a small stab incision, and this was sutured in place with 3-  0 nylon sutures.  This will be used to manage her bladder outflow during her  voiding trial phase.  The anterior vaginal mucosa was then trimmed up and  closed with a running suture of 2-0 Vicryl.  Final inspection showed the  urethra in normal position.  It did not appear to be over-corrected.  The  cystocele had been completely reduced.  There was no enterocele.  There was  a modest rectocele, but the patient is asymptomatic from that standpoint,  and that can be left alone for now.  The area was irrigated one more time  with antibiotic solution.  A vaginal pack was placed.  The patient tolerated  the procedure well and was taken to the recovery room in good condition.  She will be given a voiding trial later this afternoon and will be given the option of going home.  She will have the suprapubic tube and will  be taught  how to use that device.  Because of a past history of anesthetic issues;  however, we will consider 23 hour placement if not otherwise ready to go.  Jamison Neighbor, M.D.    RJE/MEDQ  D:  10/31/2003  T:  10/31/2003  Job:  [id number]

## 2010-09-06 ENCOUNTER — Other Ambulatory Visit: Payer: Self-pay | Admitting: Family Medicine

## 2011-01-13 ENCOUNTER — Other Ambulatory Visit: Payer: Self-pay | Admitting: Family Medicine

## 2011-01-13 DIAGNOSIS — Z1231 Encounter for screening mammogram for malignant neoplasm of breast: Secondary | ICD-10-CM

## 2011-02-15 ENCOUNTER — Ambulatory Visit
Admission: RE | Admit: 2011-02-15 | Discharge: 2011-02-15 | Disposition: A | Discharge status: Home / Self Care | Payer: Medicare Other | Source: Ambulatory Visit | Attending: Family Medicine | Admitting: Family Medicine

## 2011-02-15 DIAGNOSIS — Z1231 Encounter for screening mammogram for malignant neoplasm of breast: Secondary | ICD-10-CM

## 2011-02-15 IMAGING — MG MM DIGITAL SCREENING BILAT
5 series · 5 of 5 positions shown · non-contrast
Comparison: none

DG SCREEN MAMMOGRAM BILATERAL
Bilateral CC and MLO view(s) were taken.

DIGITAL SCREENING MAMMOGRAM WITH CAD:
The breast tissue is heterogeneously dense.  No masses or malignant type calcifications are 
identified.  Compared with prior studies.
Images were processed with CAD.

[R CC]
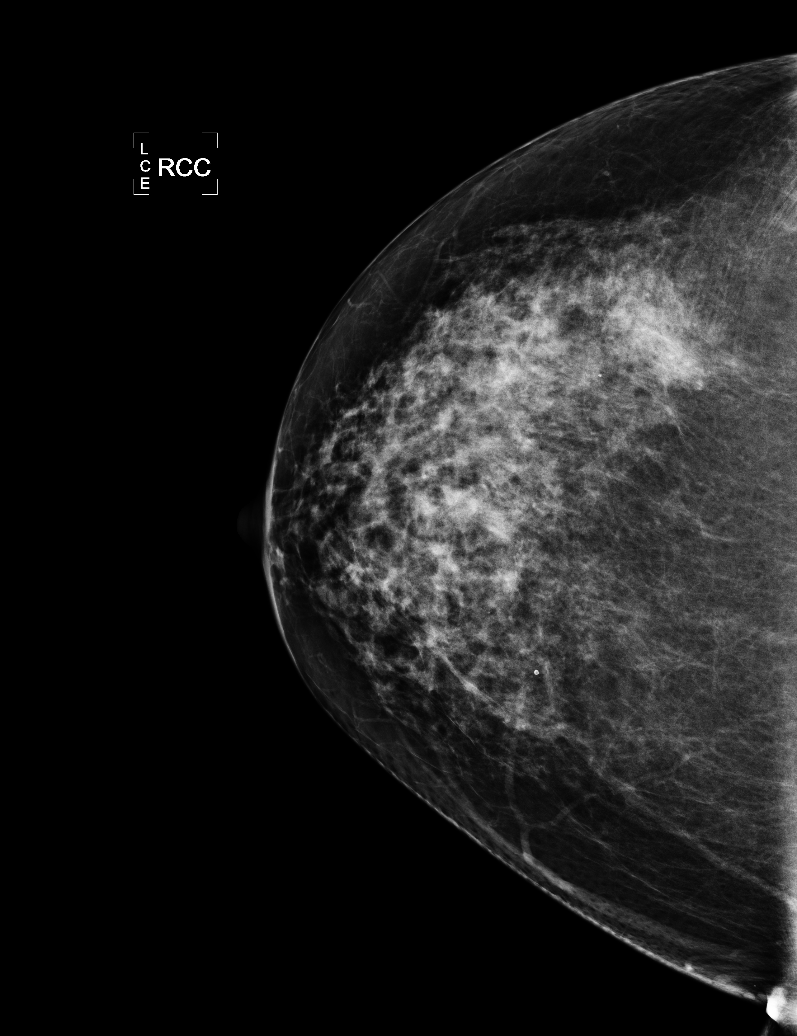

[L CC]
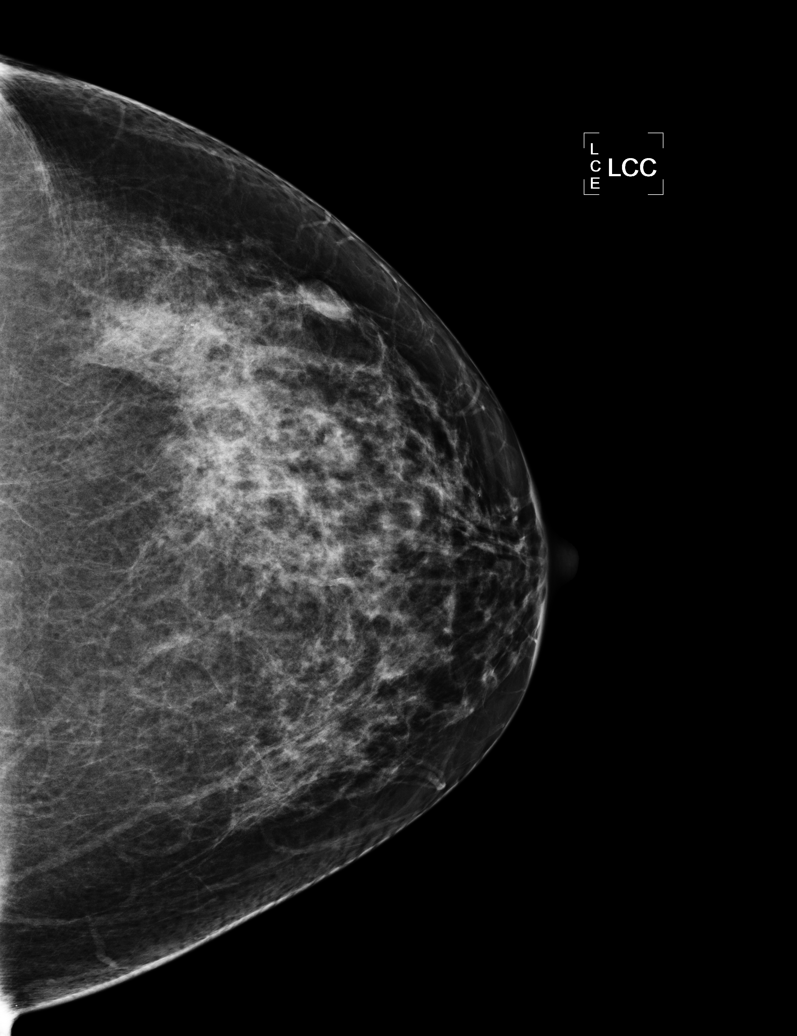

[L MLO]
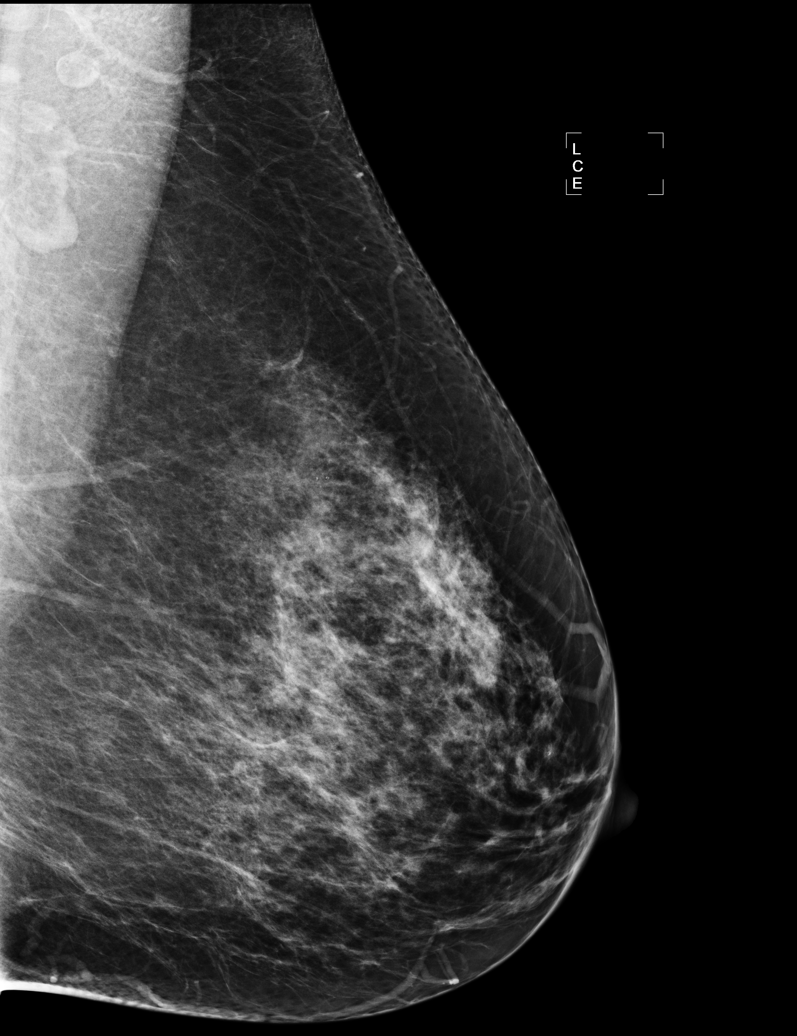

[R MLO (1 of 2)]
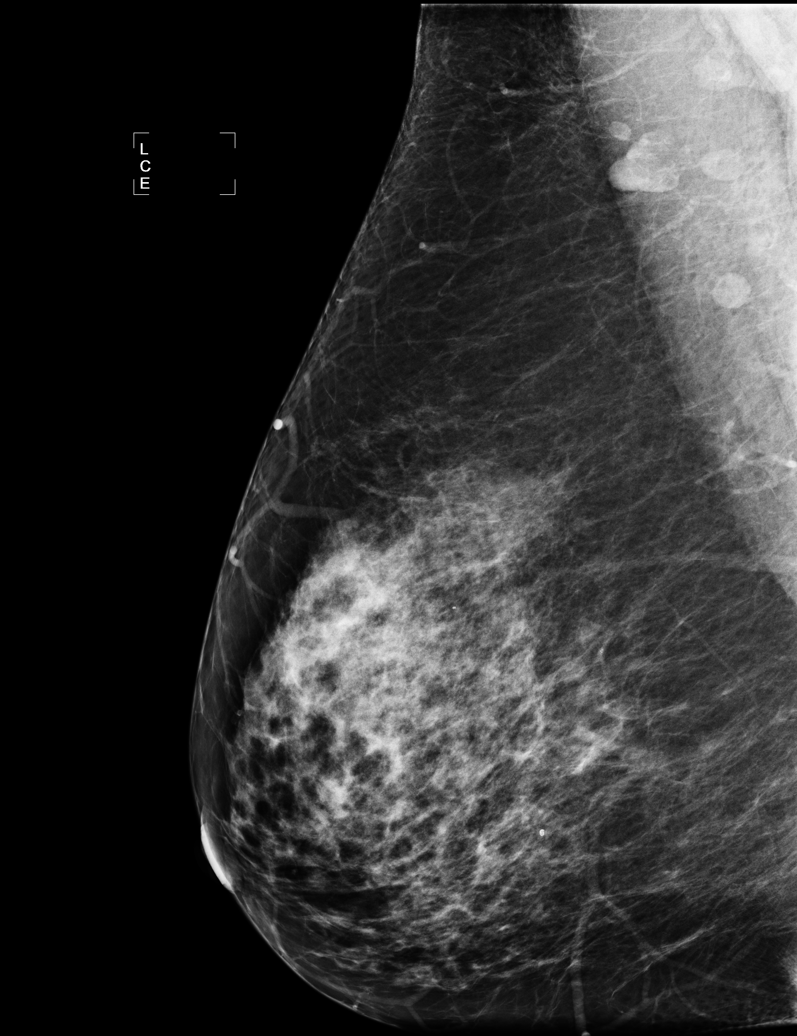

[R MLO (2 of 2)]
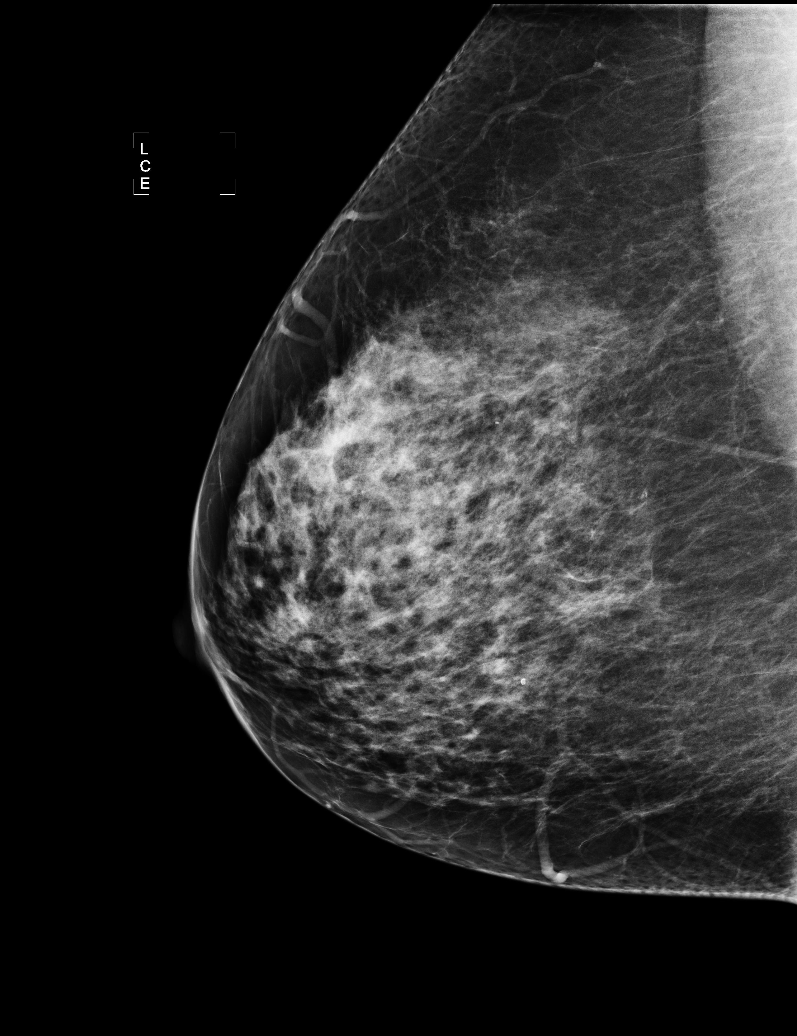

[5 of 5 positions shown; findings below may reference images not displayed]

IMPRESSION: No specific mammographic evidence of malignancy.  Next screening mammogram is recommended in one 
year.

A result letter of this screening mammogram will be mailed directly to the patient.

ASSESSMENT: Negative - BI-RADS 1

Screening mammogram in 1 year.
,

## 2011-08-03 ENCOUNTER — Encounter: Payer: Self-pay | Admitting: Family Medicine

## 2011-08-03 ENCOUNTER — Ambulatory Visit (INDEPENDENT_AMBULATORY_CARE_PROVIDER_SITE_OTHER): Payer: Medicare Other | Admitting: Family Medicine

## 2011-08-03 DIAGNOSIS — M810 Age-related osteoporosis without current pathological fracture: Secondary | ICD-10-CM

## 2011-08-03 DIAGNOSIS — M1990 Unspecified osteoarthritis, unspecified site: Secondary | ICD-10-CM

## 2011-08-03 NOTE — Progress Notes (Signed)
  Subjective:    Patient ID: Sheri Burns, female    DOB: Jun 03, 1955, 56 y.o.   MRN: [id number]  HPI Here to follow up on osteoporosis. Her last DEXA was in 2009. She takes 1200 mg a day of calcium, and she has been taking 50,000 units of vitamin D weekly for the past 4 years. This has been managed by Dr. Tresa Res during this time. Her vitamin D levels have been checked yearly since 2010 ( they have been 30, 44, 53, and then 41 last month). She has had trouble with osteoarthritis in the past 6 months, involving the left knee, both thumbs, and her left 3rd finger. She wears a spica splint on the left wrist. She has had steroid injection in the knee per Dr. Ollen Gross and in both thumbs per Dr. Bradly Bienenstock.    Review of Systems  Constitutional: Negative.   Respiratory: Negative.   Cardiovascular: Negative.   Musculoskeletal: Positive for arthralgias.       Objective:   Physical Exam  Constitutional: She appears well-developed and well-nourished.  Cardiovascular: Normal rate, regular rhythm, normal heart sounds and intact distal pulses.   Pulmonary/Chest: Effort normal and breath sounds normal.          Assessment & Plan:  We will set up another DEXA soon. I suggested she take 1500 mg of calcium daily.

## 2011-08-24 ENCOUNTER — Ambulatory Visit (INDEPENDENT_AMBULATORY_CARE_PROVIDER_SITE_OTHER)
Admission: RE | Admit: 2011-08-24 | Discharge: 2011-08-24 | Disposition: A | Discharge status: Home / Self Care | Payer: Medicare Other | Source: Ambulatory Visit | Attending: Family Medicine | Admitting: Family Medicine

## 2011-08-24 DIAGNOSIS — M810 Age-related osteoporosis without current pathological fracture: Secondary | ICD-10-CM

## 2011-09-14 ENCOUNTER — Telehealth: Payer: Self-pay | Admitting: Family Medicine

## 2011-09-14 NOTE — Telephone Encounter (Signed)
Pt called req to get Bone Density results. Pls call asap.

## 2011-09-14 NOTE — Telephone Encounter (Signed)
I spoke with pt and gave results. She would like for me to fax a copy to GYN office at [phone].

## 2011-09-14 NOTE — Progress Notes (Signed)
Quick Note:  I spoke with pt and she would like for [place] to fax a copy to GYN [phone]. ______

## 2011-09-15 NOTE — Telephone Encounter (Signed)
I printed a copy and faxed to GYN.

## 2011-10-17 ENCOUNTER — Other Ambulatory Visit: Payer: Self-pay | Admitting: Family Medicine

## 2012-03-06 ENCOUNTER — Other Ambulatory Visit: Payer: Self-pay | Admitting: Obstetrics and Gynecology

## 2012-03-06 DIAGNOSIS — Z1231 Encounter for screening mammogram for malignant neoplasm of breast: Secondary | ICD-10-CM

## 2012-04-04 ENCOUNTER — Ambulatory Visit
Admission: RE | Admit: 2012-04-04 | Discharge: 2012-04-04 | Disposition: A | Discharge status: Home / Self Care | Payer: Self-pay | Source: Ambulatory Visit | Attending: Obstetrics and Gynecology | Admitting: Obstetrics and Gynecology

## 2012-04-04 DIAGNOSIS — Z1231 Encounter for screening mammogram for malignant neoplasm of breast: Secondary | ICD-10-CM

## 2012-04-07 ENCOUNTER — Ambulatory Visit: Payer: Medicare Other

## 2012-05-04 ENCOUNTER — Encounter: Payer: Self-pay | Admitting: Nurse Practitioner

## 2012-06-08 ENCOUNTER — Ambulatory Visit (INDEPENDENT_AMBULATORY_CARE_PROVIDER_SITE_OTHER): Payer: MEDICARE | Admitting: Nurse Practitioner

## 2012-06-08 ENCOUNTER — Encounter: Payer: Self-pay | Admitting: Nurse Practitioner

## 2012-06-08 VITALS — BP 110/70 | HR 62 | Resp 16 | Ht 66.0 in | Wt 136.0 lb

## 2012-06-08 DIAGNOSIS — E559 Vitamin D deficiency, unspecified: Secondary | ICD-10-CM

## 2012-06-08 DIAGNOSIS — Z0000 Encounter for general adult medical examination without abnormal findings: Secondary | ICD-10-CM

## 2012-06-08 DIAGNOSIS — Z01419 Encounter for gynecological examination (general) (routine) without abnormal findings: Secondary | ICD-10-CM

## 2012-06-08 MED ORDER — VITAMIN D (ERGOCALCIFEROL) 1.25 MG (50000 UNIT) PO CAPS: 50000.0000 [IU] | ORAL_CAPSULE | ORAL | Status: DC

## 2012-06-08 NOTE — Progress Notes (Signed)
57 y.o. MarriedCaucasian female   G1P1000 here for annual exam.   No vaginal dryness. Not sexually active. X 5 years Husband is 22 yrs older than pt.  His has had some recent health issues. Not currently on RX Vit D but taking OTC vit D.    She would like to go off Fosamax since this is her 5th year on meds.  She plans another BMD with PCP this summer and will discuss "Drug Holiday" with him at that time.  Her last BMD shows left hip neck T Score at -2.4.  She does have FMH of osteo.  Sees Urologist regularly and urinary symptoms are much better on Toviaz.  No LMP recorded. Patient has had a hysterectomy.          Sexually active: no  The Whitaker method of family planning is none.    Exercising: yes  walking Last mammogram: 04/04/12 Last colonoscopy:12/2008 repeat in 5 years Last pap: none Last BMD: 08/14/11 Alcohol: no  Tobacco: former   Health Maintenance  Topic Date Due  . Pap Smear  02/14/1972  . Tetanus/tdap  02/13/1973  . Colonoscopy  02/14/2004  . Influenza Vaccine  11/07/2010  . Mammogram  04/04/2014    Family History  Problem Relation Age of Onset  . Arthritis    . Hypertension    . Mental illness    . Osteoporosis Mother     severe  . Cancer Mother 22    colon /Breast also m. niece at 57's  . Osteopenia    . Mitral valve prolapse Sister     Patient Active Problem List  Diagnosis  . ANXIETY  . MULTIPLE SCLEROSIS  . OVERACTIVE BLADDER  . OSTEOARTHRITIS  . OSTEOPOROSIS  . HEADACHE  . COLONIC POLYPS, HX OF    Past Medical History  Diagnosis Date  . Colon polyp   . Headache   . Osteoarthritis   . Multiple sclerosis since 1992    Dr. Despina Arias in [facility]  . Ulcerative proctitis   . Overactive bladder     sees Dr. Lorin Picket McDiarmid   . Osteoporosis     last DEXA 08-28-07  . Gynecological examination     sees Dr. Meredeth Ide   . Multiple thyroid nodules 1995    per pt. resolved on its own follwed by endo then released  . GERD (gastroesophageal  reflux disease)   . Depression   . Bulging lumbar disc 1980  . Pancreatitis 1977  . Neurogenic bladder disorder 12/2009    w/ urge & stress inc. dx. Dr. McDiarmid    Past Surgical History  Procedure Laterality Date  . Appendectomy    . Tonsillectomy    . Widom teeth removal    . Dilation and curettage of uterus    . Left knee surgery  1978    to remove bone tumor    . Cholecystectomy    . Laparoscopy      x 3  . Nasal turbinate reduction    . Breast ruptured  left    ductal cyst  . Vaginal cystocele repair      anterior  . Colonoscopy  01/10/09    repeat in 5 yrs Dr. Charlott Rakes  . Pubo vaginal sling  10/2003  . Vaginal hysterectomy  1995    AUB & Endometriosis    Allergies: Codeine; Demerol; Penicillins; Stadol; and Sulfonamide derivatives  [facility] Prescriptions  Medication Sig Dispense Refill  . alendronate (FOSAMAX) 35 MG tablet  TAKE 1 TABLET BY MOUTH EVERY WEEK  4 tablet  10  . ALPRAZolam (XANAX) 0.5 MG tablet Take 0.5 mg by mouth at bedtime as needed.      . citalopram (CELEXA) 40 MG tablet Take 40 mg by mouth daily.      . clonazePAM (KLONOPIN) 1 MG tablet Take 1 mg by mouth 4 (four) times daily as needed.      . docusate sodium (COLACE) 100 MG capsule Take 100 mg by mouth 2 (two) times daily.      . fesoterodine (TOVIAZ) 4 MG TB24 Take 4 mg by mouth daily.      Marland Kitchen gabapentin (NEURONTIN) 600 MG tablet Take 600 mg by mouth 4 (four) times daily.      . hydrocodone-acetaminophen (LORCET PLUS) 7.5-650 MG per tablet Take 1 tablet by mouth every 6 (six) hours as needed.      Marland Kitchen imipramine (TOFRANIL) 25 MG tablet Take 25 mg by mouth at bedtime.      . methylphenidate (RITALIN) 10 MG tablet Take 10 mg by mouth 2 (two) times daily. Take 1 qam and take 2 at noon      . rizatriptan (MAXALT) 10 MG tablet Take 10 mg by mouth as needed. May repeat in 2 hours if needed      . traMADol (ULTRAM) 50 MG tablet Take 50 mg by mouth every 6 (six) hours as needed.      .  Vitamin D, Ergocalciferol, (DRISDOL) 50000 UNITS CAPS Take 1 capsule (50,000 Units total) by mouth every 7 (seven) days.  30 capsule  3  . DULoxetine (CYMBALTA) 60 MG capsule        No [facility] facility-administered medications for this visit.    ROS: A comprehensive review of systems was negative.  Exam:    BP 110/70  Pulse 62  Resp 16  Ht 5\' 6"  (1.676 m)  Wt 136 lb (61.689 kg)  BMI 21.96 kg/m2 Weight change: @WEIGHTCHANGE @ Last 3 height recordings:  Ht Readings from Last 3 Encounters:  06/08/12 5\' 6"  (1.676 m)  07/04/07 5' 6.5" (1.689 m)   General appearance: alert, cooperative and appears stated age Head: Normocephalic, without obvious abnormality Neck: no adenopathy, supple, symmetrical, trachea midline and thyroid not enlarged, symmetric, no tenderness/mass/nodules Lungs: clear to auscultation bilaterally Breasts: normal appearance, no masses or tenderness, positive findings: fibrocystic changes Heart: regular rate and rhythm, S1, S2 normal, no murmur, click, rub or gallop Abdomen: normal findings: soft area with possible incisional hernia right of umbilicus. and no pain.  Some increase in size with raising up. Extremities: extremities normal, atraumatic, no cyanosis or edema Skin: Skin color, texture, turgor normal. No rashes or lesions Lymph nodes: Cervical, supraclavicular, and axillary nodes normal. no inguinal nodes palpated Neurologic: Alert and oriented X 3, normal strength and tone. Normal symmetric reflexes. Normal coordination and gait   Pelvic: External genitalia:  no lesions              Urethra: stenotic              Bartholins and Skenes: normal                 Vagina: atrophic              Cervix: absent              Pap taken: no        Bimanual Exam:  Uterus:  surgically absent, vaginal cuff well healed  Adnexa:    no masses                                      Rectovaginal: Confirms                                       Anus:  normal sphincter tone, no lesions  A:  GYN well woman exam  S/P hysterectomy secondary to endometriosis  Osteoporosis on fosamax  Vit D def.     P:  mammogram due 03/2013  Vit D level and follow, may not need RX Vit D  Return annually or prn      An After Visit Summary was printed and given to the patient.

## 2012-06-08 NOTE — Patient Instructions (Addendum)
Vitamin D Deficiency Vitamin D is an important vitamin that your body needs. Having too little of it in your body is called a deficiency. A very bad deficiency can make your bones soft and can cause a condition called rickets.  Vitamin D is important to your body for different reasons, such as:   It helps your body absorb 2 minerals called calcium and phosphorus.  It helps make your bones healthy.  It may prevent some diseases, such as diabetes and multiple sclerosis.  It helps your muscles and heart. You can get vitamin D in several ways. It is a natural part of some foods. The vitamin is also added to some dairy products and cereals. Some people take vitamin D supplements. Also, your body makes vitamin D when you are in the sun. It changes the sun's rays into a form of the vitamin that your body can use. CAUSES   Not eating enough foods that contain vitamin D.  Not getting enough sunlight.  Having certain digestive system diseases that make it hard to absorb vitamin D. These diseases include Crohn's disease, chronic pancreatitis, and cystic fibrosis.  Having a surgery in which part of the stomach or small intestine is removed.  Being obese. Fat cells pull vitamin D out of your blood. That means that obese people may not have enough vitamin D left in their blood and in other body tissues.  Having chronic kidney or liver disease. RISK FACTORS Risk factors are things that make you more likely to develop a vitamin D deficiency. They include:  Being older.  Not being able to get outside very much.  Living in a nursing home.  Having had broken bones.  Having weak or thin bones (osteoporosis).  Having a disease or condition that changes how your body absorbs vitamin D.  Having dark skin.  Some medicines such as seizure medicines or steroids.  Being overweight or obese. SYMPTOMS Mild cases of vitamin D deficiency may not have any symptoms. If you have a very bad case, symptoms  may include:  Bone pain.  Muscle pain.  Falling often.  Broken bones caused by a minor injury, due to osteoporosis. DIAGNOSIS A blood test is the best way to tell if you have a vitamin D deficiency. TREATMENT Vitamin D deficiency can be treated in different ways. Treatment for vitamin D deficiency depends on what is causing it. Options include:  Taking vitamin D supplements.  Taking a calcium supplement. Your caregiver will suggest what dose is best for you. HOME CARE INSTRUCTIONS  Take any supplements that your caregiver prescribes. Follow the directions carefully. Take only the suggested amount.  Have your blood tested 2 months after you start taking supplements.  Eat foods that contain vitamin D. Healthy choices include:  Fortified dairy products, cereals, or juices. Fortified means vitamin D has been added to the food. Check the label on the package to be sure.  Fatty fish like salmon or trout.  Eggs.  Oysters.  Spend some time in the sun. Most people should get out in the sun without sunblock for 10 to 15 minutes at a time. Do this 3 times a week. People who have had skin cancer should not do this. Ask your caregiver how long you should be in the sun. Do not use a tanning bed.  Keep your weight at a healthy level. Lose weight if you need to.  Keep all follow-up appointments. Your caregiver will need to perform blood tests to make sure your  vitamin D deficiency is going away. SEEK MEDICAL CARE IF:  You have any questions about your treatment.  You continue to have symptoms of vitamin D deficiency.  You have nausea or vomiting.  You are constipated.  You feel confused.  You have severe abdominal or back pain. MAKE SURE YOU:  Understand these instructions.  Will watch your condition.  Will get help right away if you are not doing well or get worse. Document Released: 05/17/2011 Document Reviewed: 05/17/2011 [facility] Patient Information 2013 [place],  [place].   Next pap due in 1 year Next Mammogram due: 03/2013 Examine your breast Monthly Take a Women's multivitamin Take 1200 mg. of calcium daily - prefer dietary If any concerns in interim to call back Vit D results

## 2012-06-09 ENCOUNTER — Telehealth: Payer: Self-pay | Admitting: Nurse Practitioner

## 2012-06-09 LAB — VITAMIN D 25 HYDROXY (VIT D DEFICIENCY, FRACTURES): Vit D, 25-Hydroxy: 37 ng/mL (ref 30–89)

## 2012-06-09 NOTE — Telephone Encounter (Signed)
pt returning Belinda's call re: results/[place]

## 2012-06-11 NOTE — Progress Notes (Signed)
Encounter reviewed by Dr. Brook Silva.  

## 2012-06-12 LAB — HEMOGLOBIN, FINGERSTICK: Hemoglobin, fingerstick: 13.7 g/dL (ref 12.0–16.0)

## 2013-01-11 ENCOUNTER — Other Ambulatory Visit: Payer: Self-pay

## 2013-05-08 ENCOUNTER — Other Ambulatory Visit: Payer: Self-pay

## 2013-05-08 DIAGNOSIS — Z1231 Encounter for screening mammogram for malignant neoplasm of breast: Secondary | ICD-10-CM

## 2013-05-23 ENCOUNTER — Ambulatory Visit
Admission: RE | Admit: 2013-05-23 | Discharge: 2013-05-23 | Disposition: A | Discharge status: Home / Self Care | Payer: Medicare Other | Source: Ambulatory Visit

## 2013-05-23 ENCOUNTER — Other Ambulatory Visit: Payer: Self-pay

## 2013-05-23 DIAGNOSIS — Z1231 Encounter for screening mammogram for malignant neoplasm of breast: Secondary | ICD-10-CM

## 2013-06-11 ENCOUNTER — Ambulatory Visit (INDEPENDENT_AMBULATORY_CARE_PROVIDER_SITE_OTHER): Payer: Medicare Other | Admitting: Nurse Practitioner

## 2013-06-11 ENCOUNTER — Encounter: Payer: Self-pay | Admitting: Nurse Practitioner

## 2013-06-11 VITALS — BP 104/70 | HR 76 | Ht 66.0 in | Wt 127.0 lb

## 2013-06-11 DIAGNOSIS — Z01419 Encounter for gynecological examination (general) (routine) without abnormal findings: Secondary | ICD-10-CM

## 2013-06-11 DIAGNOSIS — M810 Age-related osteoporosis without current pathological fracture: Secondary | ICD-10-CM

## 2013-06-11 DIAGNOSIS — N390 Urinary tract infection, site not specified: Secondary | ICD-10-CM

## 2013-06-11 DIAGNOSIS — Z0000 Encounter for general adult medical examination without abnormal findings: Secondary | ICD-10-CM

## 2013-06-11 LAB — POCT URINALYSIS DIPSTICK
Bilirubin, UA: NEGATIVE
GLUCOSE UA: NEGATIVE
Ketones, UA: NEGATIVE
Nitrite, UA: POSITIVE
PROTEIN UA: NEGATIVE
Urobilinogen, UA: NEGATIVE
pH, UA: 7

## 2013-06-11 LAB — HEMOGLOBIN, FINGERSTICK: Hemoglobin, fingerstick: 13.4 g/dL (ref 12.0–16.0)

## 2013-06-11 MED ORDER — NITROFURANTOIN MONOHYD MACRO 100 MG PO CAPS: 100.0000 mg | ORAL_CAPSULE | Freq: Two times a day (BID) | ORAL | Status: DC

## 2013-06-11 NOTE — Patient Instructions (Signed)

## 2013-06-11 NOTE — Progress Notes (Signed)
Patient ID: Sheri Burns, female   DOB: [birth year], 58 y.o.   MRN: [id number] 58 y.o. G47P1000 Married Caucasian Fe here for annual exam.  Off Fosamax since last May .  Will get BMD this year.  OTC Vit D 500 IU daily.  With her neurogenic bladder has urinary retention.  No  obvious signs of UTI today.  Husband will be 2 this year.  Has a lot of memory issues.  Needs a referral to different PCP.  No LMP recorded. Patient has had a hysterectomy.          Sexually active: no  The Rissmiller method of family planning is abstinence and status post hysterectomy.    Exercising: no  The patient does not participate in regular exercise at present. Smoker:  no  Health Maintenance: Pap:  TVH 1995 MMG:  05/23/13, Bi-Rads 1: negative Colonoscopy:  12/2008, repeat 5 years BMD:  08/24/11, -0.8/-2.3/-2.1 TDaP:  ? Labs:  HB:  13.4 Urine:  Trace RBC, trace leuk's, pos nitrites   reports that she quit smoking about 34 years ago. Her smoking use included Cigarettes. She has a 5 pack-year smoking history. She has never used smokeless tobacco. She reports that she does not drink alcohol or use illicit drugs.  Past Medical History  Diagnosis Date  . Colon polyp   . Headache(784.0)   . Osteoarthritis   . Multiple sclerosis since 1992    Dr. Arlice Colt in [facility]  . Ulcerative proctitis   . Overactive bladder     sees Dr. Nicki Reaper McDiarmid   . Osteoporosis     last DEXA 08-28-07  . Gynecological examination     sees Dr. Elyse Hsu   . Multiple thyroid nodules 1995    per pt. resolved on its own follwed by endo then released  . GERD (gastroesophageal reflux disease)   . Depression   . Bulging lumbar disc 1980  . Pancreatitis 1977  . Neurogenic bladder disorder 12/2009    w/ urge & stress inc. dx. Dr. McDiarmid    Past Surgical History  Procedure Laterality Date  . Appendectomy    . Tonsillectomy    . Widom teeth removal    . Dilation and curettage of uterus    . Left knee surgery  1978     to remove bone tumor    . Cholecystectomy    . Laparoscopy      x 3  . Nasal turbinate reduction    . Breast ruptured  left    ductal cyst  . Vaginal cystocele repair      anterior  . Colonoscopy  01/10/09    repeat in 5 yrs Dr. Wilford Corner  . Pubo vaginal sling  10/2003  . Vaginal hysterectomy  1995    AUB & Endometriosis    [facility] Prescriptions  Medication Sig Dispense Refill  . acetaminophen (TYLENOL) 500 MG tablet Take 500 mg by mouth 4 (four) times daily.      Marland Kitchen ALPRAZolam (XANAX) 0.5 MG tablet Take 0.5 mg by mouth 2 (two) times daily.       . AMITIZA 24 MCG capsule Take 1 capsule by mouth 2 (two) times daily.      . Calcium Carb-Cholecalciferol (CALCIUM 600 + D PO) Take 1 tablet by mouth daily.      . clonazePAM (KLONOPIN) 1 MG tablet Take 3 mg by mouth at bedtime.       . DULoxetine (CYMBALTA) 60 MG capsule Take 60 mg  by mouth daily.       . fesoterodine (TOVIAZ) 4 MG TB24 Take 4 mg by mouth daily.      Marland Kitchen gabapentin (NEURONTIN) 600 MG tablet Take 600 mg by mouth 4 (four) times daily.      . hydrocodone-acetaminophen (LORCET PLUS) 7.5-650 MG per tablet Take 1 tablet by mouth every 6 (six) hours as needed.      Marland Kitchen imipramine (TOFRANIL) 25 MG tablet Take 50 mg by mouth at bedtime.       . methylphenidate (RITALIN) 20 MG tablet Take 20 mg by mouth 2 (two) times daily.      . Multiple Vitamin (MULTIVITAMIN) tablet Take 1 tablet by mouth daily.      Marland Kitchen oxybutynin (DITROPAN-XL) 10 MG 24 hr tablet Take 1 tablet by mouth daily.      . rizatriptan (MAXALT) 10 MG tablet Take 10 mg by mouth as needed. May repeat in 2 hours if needed      . traMADol (ULTRAM) 50 MG tablet Take 50 mg by mouth every 6 (six) hours as needed.      . nitrofurantoin, macrocrystal-monohydrate, (MACROBID) 100 MG capsule Take 1 capsule (100 mg total) by mouth 2 (two) times daily.  14 capsule  0   No [facility] facility-administered medications for this visit.    Family History  Problem Relation Age  of Onset  . Osteoporosis Mother     severe  . Cancer Mother 60    colon /Breast also m. niece at 21's  . Osteopenia Sister     X 2    ROS:  Pertinent items are noted in HPI.  Otherwise, a comprehensive ROS was negative.  Exam:   BP 104/70  Pulse 76  Ht 5\' 6"  (1.676 m)  Wt 127 lb (57.607 kg)  BMI 20.51 kg/m2 Height: 5\' 6"  (167.6 cm)  Ht Readings from Last 3 Encounters:  06/11/13 5\' 6"  (1.676 m)  06/08/12 5\' 6"  (1.676 m)  07/04/07 5' 6.5" (1.689 m)    General appearance: alert, cooperative and appears stated age Head: Normocephalic, without obvious abnormality, atraumatic Neck: no adenopathy, supple, symmetrical, trachea midline and thyroid normal to inspection and palpation Lungs: clear to auscultation bilaterally Breasts: normal appearance, no masses or tenderness Heart: regular rate and rhythm Abdomen: soft, non-tender; no masses,  no organomegaly Extremities: extremities normal, atraumatic, no cyanosis or edema Skin: Skin color, texture, turgor normal. No rashes or lesions Lymph nodes: Cervical, supraclavicular, and axillary nodes normal. No abnormal inguinal nodes palpated Neurologic: Grossly normal   Pelvic: External genitalia:  no lesions              Urethra:  normal appearing urethra with no masses, tenderness or lesions              Bartholin's and Skene's: normal                 Vagina: atrophic appearing vagina with pale color and discharge, no lesions              Cervix: absent              Pap taken: no Bimanual Exam:  Uterus:  uterus absent              Adnexa: no mass, fullness, tenderness               Rectovaginal: Confirms               Anus:  normal sphincter tone,  no lesions  A:  Well Woman with normal exam  S/P TVH 1995 secondary to fibroids and endometriosis  Postmenopausal no HRT  History of MS with a neurogenic bladder, currently with asymptomatic bacteremia - R/O UTI  Osteoporosis off Fosamax since last May - to repeat BMD this year.  S/P  Pubo vaginal sling 10/2003  Bulging  disk L 4-5 with chronic pain  Update immunization - did not get TDaP  today     P:   Pap smear as per guidelines   Mammogram is due 05/2014  Will get referral to PCP to establish care  Order sent in for repeat BMD at [facility] to check stability  Start on Macrobid 100 mg BID and will follow with urine cultures  Counseled on breast self exam, mammography screening, adequate intake of calcium and vitamin D, diet and exercise, Kegel's exercises return annually or prn  An After Visit Summary was printed and given to the patient.   Patient left before TDaP was given - when she returns for labs will get TDaP then.

## 2013-06-12 LAB — URINE CULTURE
COLONY COUNT: NO GROWTH
Organism ID, Bacteria: NO GROWTH

## 2013-06-12 NOTE — Progress Notes (Signed)
Encounter reviewed by Dr. Brook Silva.  

## 2013-06-12 NOTE — Progress Notes (Signed)
Please have nurse finish vaccine administration so encounter can be closed.

## 2013-06-18 ENCOUNTER — Other Ambulatory Visit: Payer: Self-pay | Admitting: Nurse Practitioner

## 2013-06-18 DIAGNOSIS — N390 Urinary tract infection, site not specified: Secondary | ICD-10-CM

## 2013-07-03 ENCOUNTER — Ambulatory Visit (INDEPENDENT_AMBULATORY_CARE_PROVIDER_SITE_OTHER): Payer: Medicare Other | Admitting: *Deleted

## 2013-07-03 VITALS — BP 110/60 | HR 60 | Ht 66.0 in | Wt 127.0 lb

## 2013-07-03 DIAGNOSIS — Z23 Encounter for immunization: Secondary | ICD-10-CM

## 2013-07-03 DIAGNOSIS — N390 Urinary tract infection, site not specified: Secondary | ICD-10-CM

## 2013-07-03 NOTE — Progress Notes (Signed)
Patient ID: Sheri Burns, female   DOB: 1955/05/17, 58 y.o.   MRN: [id number] Pt presented for urine TOC and micro.  Pt has completed antibiotics.  Pt is not having symptoms. TDaP given per order as this was not given at [facility].  Pt tolerated well.

## 2013-07-03 NOTE — Addendum Note (Signed)
Addended by: Blanchard Kelch R on: 07/03/2013 01:46 PM   Modules accepted: Orders

## 2013-07-04 LAB — URINALYSIS, MICROSCOPIC ONLY
Bacteria, UA: NONE SEEN
CRYSTALS: NONE SEEN
Casts: NONE SEEN

## 2013-07-07 LAB — URINE CULTURE: Colony Count: >=100000

## 2014-03-12 ENCOUNTER — Other Ambulatory Visit: Payer: Self-pay | Admitting: Neurology

## 2014-03-12 MED ORDER — AMPHETAMINE-DEXTROAMPHET ER 30 MG PO CP24: 30.0000 mg | ORAL_CAPSULE | Freq: Every day | ORAL | Status: DC

## 2014-03-18 ENCOUNTER — Other Ambulatory Visit: Payer: Self-pay | Admitting: *Deleted

## 2014-03-18 ENCOUNTER — Telehealth: Payer: Self-pay | Admitting: Neurology

## 2014-03-18 ENCOUNTER — Telehealth: Payer: Self-pay | Admitting: *Deleted

## 2014-03-18 MED ORDER — DULOXETINE HCL 60 MG PO CPEP: 60.0000 mg | ORAL_CAPSULE | Freq: Every day | ORAL | Status: DC

## 2014-03-18 MED ORDER — GABAPENTIN 600 MG PO TABS: 600.0000 mg | ORAL_TABLET | Freq: Four times a day (QID) | ORAL | Status: DC

## 2014-03-18 NOTE — Telephone Encounter (Signed)
Dr. Felecia Shelling is not listed with [facility], he was listed at [facility] but not at [facility].  So she cannot get a referral.  It was suggested that she use another doctors name at [facility].  Please call and advise.

## 2014-03-18 NOTE — Telephone Encounter (Signed)
Sheri Burns requests r/f of Duloxetine and Gabapentin.  Gabapentin 600mg  #120 with one additional r/f and Duloxetine 60mg  #30 with one additional r/f called to [facility] on [address] in [place] per her request.  Phone # [phone]./fim

## 2014-03-29 ENCOUNTER — Ambulatory Visit: Payer: Self-pay | Admitting: Neurology

## 2014-04-02 ENCOUNTER — Ambulatory Visit (INDEPENDENT_AMBULATORY_CARE_PROVIDER_SITE_OTHER): Payer: Commercial Managed Care - HMO | Admitting: Neurology

## 2014-04-02 ENCOUNTER — Other Ambulatory Visit: Payer: Self-pay | Admitting: *Deleted

## 2014-04-02 ENCOUNTER — Encounter: Payer: Self-pay | Admitting: Neurology

## 2014-04-02 VITALS — BP 126/78 | HR 66 | Resp 14 | Ht 66.0 in | Wt 130.6 lb

## 2014-04-02 DIAGNOSIS — R1310 Dysphagia, unspecified: Secondary | ICD-10-CM

## 2014-04-02 DIAGNOSIS — G35 Multiple sclerosis: Secondary | ICD-10-CM

## 2014-04-02 DIAGNOSIS — N318 Other neuromuscular dysfunction of bladder: Secondary | ICD-10-CM

## 2014-04-02 DIAGNOSIS — R260 Ataxic gait: Secondary | ICD-10-CM | POA: Diagnosis not present

## 2014-04-02 DIAGNOSIS — R5382 Chronic fatigue, unspecified: Secondary | ICD-10-CM

## 2014-04-02 DIAGNOSIS — F418 Other specified anxiety disorders: Secondary | ICD-10-CM | POA: Diagnosis not present

## 2014-04-02 MED ORDER — AMPHETAMINE-DEXTROAMPHET ER 30 MG PO CP24: 30.0000 mg | ORAL_CAPSULE | Freq: Every day | ORAL | Status: DC

## 2014-04-02 MED ORDER — ALPRAZOLAM 0.5 MG PO TABS: 0.5000 mg | ORAL_TABLET | Freq: Two times a day (BID) | ORAL | Status: DC

## 2014-04-02 NOTE — Progress Notes (Signed)
[facility]  PATIENT: Sheri Burns DOB: 1955-11-03  REFERRING CLINICIAN: Thressa Sheller  HISTORY FROM: Patient REASON FOR VISIT: MS   HISTORICAL  CHIEF COMPLAINT:  Chief Complaint  Patient presents with  . Multiple Sclerosis    Denies new or worsening sx.  Sts. she doesn't feel she ever fully recovered from relapse in the fall of 2015./fim    HISTORY OF PRESENT ILLNESS:  Sheri Burns is a 59 year old woman who was diagnosed with multiple sclerosis more than 20 years ago.  In the past, she was treated with Betaseron and Copaxone but has not been on any disease modifying therapy for about 8 years. During this time, she has had occasional  Exacerbations with more fatigue and gait issues.  She also has noted mild cognitive dysfunction worsening over the past 5 or 6 years. Her MRI of the brain performed 08/31/2012 shows foci that are predominantly periventricular. They are consistent with the diagnosis of multiple sclerosis. When this MRI was compared to an MRI dated 10/12/2010, there was one additional small periventricular focus. There were no acute findings on either MRI.  In November, she had an exacerbation with severe fatigue, worsened gait and dysphagia.    Her legs were giving way and she fell several times.   We did two days of IV Solu-Medrol and felt better afterwards.   However, fatigue is still bad.     She has many symptoms related to her multiple sclerosis diagnosis. She reports fatigue, cognitive difficulty, bladder dysfunction, dysphagia,  mood disturbance, decreased gait and restless leg syndrome.   She reports that her left leg is worse than the right leg. She has some weakness and stiffness. Additionally her left knee is painful. These issues all combine to cause her to have some difficulty with her gait. Back in November she had several falls.  She has had gait issues x many years but she feels gait is worse the past few years.      She has  had dysphagia since last year.   She has the most trouble swallowing foods like bread and meat.   She has no trouble with liquids.    She notes that it takes her longer to chew and swallow and the food sometimes gets stuck.   She sometimes coughs after eating.     She has bladder dysfunction.    Multiple UA/Cx show colonization but she never has symptoms of UTI.   She notes some trouble initiating urination and does not feel she completely empties.    She has frequency.    On oxybutynin, frequency and incontinence improved but hesitancy worsened.   She tries to go every 3 hours to get routine as she does not always get urge.     She notes a lot of fatigue, physical and cognitive.    She has had more cognitive issues...specifically calculations and verbal fluency and short term memory.   Focus and attention are poor .  Cognition, fatigue and attention are all better with Adderall and it lasts longer than Ritalin did.     She has more depression over the last year and often feels overwhelmed.   She feels Xanax helps anxiety some but used to help more.  Cymbalta has not helped her much.   She feels mood is worse since last November.     She also has several problems that we have followed that are unrelated including knee pain, low back and radicular leg pain and hip pain.  In the past, she has received right trochanteric bursa injections with improvement of hip pain. An epidural steroid injection did not significantly help the back and leg pain.  The hip pain has resolved and she still has left knee pain.      REVIEW OF SYSTEMS:  Constitutional: No fevers, chills, sweats, or change in appetite Eyes: No visual changes, double vision, eye pain Ear, nose and throat: No hearing loss, ear pain, nasal congestion, sore throat Cardiovascular: No chest pain, palpitations Respiratory:  No shortness of breath at rest or with exertion.   No wheezes GastrointestinaI: No nausea, vomiting, diarrhea.  Has  constipation Genitourinary:  see above. Musculoskeletal:  reports pain in knees.   Reports neck pain and back pain Integumentary: No rash, pruritus, skin lesions Neurological: as above Psychiatric: Depression and  Anxiety noted Endocrine: No palpitations, diaphoresis, change in appetite, change in weigh or increased thirst Hematologic/Lymphatic:  No anemia, purpura, petechiae. Allergic/Immunologic: No itchy/runny eyes, nasal congestion, recent allergic reactions, rashes  ALLERGIES: Allergies  Allergen Reactions  . Codeine Nausea And Vomiting    vomiting  . Demerol [Meperidine] Nausea And Vomiting  . Penicillins   . Stadol [Butorphanol] Other (See Comments)    Per patient psychotic event  . Sulfonamide Derivatives Hives    HOME MEDICATIONS: Outpatient Prescriptions Prior to Visit  Medication Sig Dispense Refill  . acetaminophen (TYLENOL) 500 MG tablet Take 500 mg by mouth 4 (four) times daily.    Marland Kitchen ALPRAZolam (XANAX) 0.5 MG tablet Take 0.5 mg by mouth 2 (two) times daily.     Marland Kitchen amphetamine-dextroamphetamine (ADDERALL XR) 30 MG 24 hr capsule Take 1 capsule (30 mg total) by mouth daily. 30 capsule 0  . Calcium Carb-Cholecalciferol (CALCIUM 600 + D PO) Take 1 tablet by mouth daily.    . clonazePAM (KLONOPIN) 1 MG tablet Take 3 mg by mouth at bedtime.     . DULoxetine (CYMBALTA) 60 MG capsule Take 1 capsule (60 mg total) by mouth daily. 60 capsule 1  . gabapentin (NEURONTIN) 600 MG tablet Take 1 tablet (600 mg total) by mouth 4 (four) times daily. 120 tablet 1  . hydrocodone-acetaminophen (LORCET PLUS) 7.5-650 MG per tablet Take 1 tablet by mouth every 6 (six) hours as needed.    Marland Kitchen imipramine (TOFRANIL) 25 MG tablet Take 50 mg by mouth at bedtime.     . Multiple Vitamin (MULTIVITAMIN) tablet Take 1 tablet by mouth daily.    Marland Kitchen oxybutynin (DITROPAN-XL) 10 MG 24 hr tablet Take 1 tablet by mouth daily.    . traMADol (ULTRAM) 50 MG tablet Take 50 mg by mouth every 6 (six) hours as needed.     . AMITIZA 24 MCG capsule Take 1 capsule by mouth 2 (two) times daily.    . fesoterodine (TOVIAZ) 4 MG TB24 Take 4 mg by mouth daily.    . nitrofurantoin, macrocrystal-monohydrate, (MACROBID) 100 MG capsule Take 1 capsule (100 mg total) by mouth 2 (two) times daily. (Patient not taking: Reported on 04/02/2014) 14 capsule 0  . rizatriptan (MAXALT) 10 MG tablet Take 10 mg by mouth as needed. May repeat in 2 hours if needed     No facility-administered medications prior to visit.    PAST MEDICAL HISTORY: Past Medical History  Diagnosis Date  . Colon polyp   . Headache(784.0)   . Osteoarthritis   . Multiple sclerosis since 1992    Dr. Arlice Colt in [facility]  . Ulcerative proctitis   . Overactive bladder  sees Dr. Nicki Reaper McDiarmid   . Osteoporosis     last DEXA 08-28-07  . Gynecological examination     sees Dr. Elyse Hsu   . Multiple thyroid nodules 1995    per pt. resolved on its own follwed by endo then released  . GERD (gastroesophageal reflux disease)   . Depression   . Bulging lumbar disc 1980  . Pancreatitis 1977  . Neurogenic bladder disorder 12/2009    w/ urge & stress inc. dx. Dr. McDiarmid  . Neuropathy   . Vision abnormalities     PAST SURGICAL HISTORY: Past Surgical History  Procedure Laterality Date  . Appendectomy    . Tonsillectomy    . Widom teeth removal    . Dilation and curettage of uterus    . Left knee surgery  1978    to remove bone tumor    . Cholecystectomy    . Laparoscopy      x 3  . Nasal turbinate reduction    . Breast ruptured  left    ductal cyst  . Vaginal cystocele repair      anterior  . Colonoscopy  01/10/09    repeat in 5 yrs Dr. Wilford Corner  . Pubo vaginal sling  10/2003  . Vaginal hysterectomy  1995    AUB & Endometriosis    FAMILY HISTORY: Family History  Problem Relation Age of Onset  . Osteoporosis Mother     severe  . Cancer Mother 49    colon /Breast also m. niece at 16's  . Osteopenia Sister     X  2    SOCIAL HISTORY:  History   Social History  . Marital Status: Married    Spouse Name: Jori Moll    Number of Children: 1  . Years of Education: 16   Occupational History  . BS Nursing   . [employer] volunteer    Social History Main Topics  . Smoking status: Former Smoker -- 1.00 packs/day for 5 years    Types: Cigarettes    Quit date: 03/09/1979  . Smokeless tobacco: Never Used  . Alcohol Use: No  . Drug Use: No  . Sexual Activity: Not Currently    Birth Control/ Protection: Surgical   Other Topics Concern  . Not on file   Social History Narrative     PHYSICAL EXAM  Filed Vitals:   04/02/14 1454  BP: 126/78  Pulse: 66  Resp: 14  Height: 5\' 6"  (1.676 m)  Weight: 130 lb 9.6 oz (59.24 kg)    Body mass index is 21.09 kg/(m^2).   General: The patient is well-developed and well-nourished and in no acute distress  Eyes:  Funduscopic exam shows normal optic discs and retinal vessels.  Neck: The neck is supple, no carotid bruits are noted.  The neck is nontender.  Respiratory: The respiratory examination is clear.  Cardiovascular: The cardiovascular examination reveals a regular rate and rhythm, no murmurs, gallops or rubs are noted.  Skin: Extremities are without significant edema.  No rash  Musculoskeletal:  Tender over right subacromial bursa and left knee.    Neurologic Exam  Mental status: The patient is alert and oriented x 3 at the time of the examination. The patient has apparent normal recent and remote memory, with an apparently normal attention span and concentration ability.   Speech is normal.  Cranial nerves: Extraocular movements are full. Pupils are equal, round, and reactive to light and accomodation.  Visual fields are full.  Facial  symmetry is present. There is good facial sensation to soft touch bilaterally.Facial strength is normal.  Trapezius and sternocleidomastoid strength is normal. No dysarthria is noted.  The tongue is midline, and the  patient has symmetric elevation of the soft palate. No obvious hearing deficits are noted.  Motor:  Muscle bulk is normal and tone is increased in legs. Strength is  5 / 5 in all 4 extremities.   Sensory: Sensory testing is intact to pinprick, soft touch, vibration sensation, and position sense on all 4 extremities.  Coordination: Cerebellar testing reveals good finger-nose-finger and slightly reduced heel-to-shin bilaterally.  Gait and station: Station is stable and gait is mildly wide and arthritic. Tandem gait is wide. Romberg is negative.   Reflexes: Deep tendon reflexes are symmetric and increased in knees (spread) and ankles (2 beatrs clonus nonsustained).. Plantar responses are normal.    DIAGNOSTIC DATA (LABS, IMAGING, TESTING) - I reviewed patient records, labs, notes, testing and imaging myself where available.  Lab Results  Component Value Date   WBC 7.2 08/07/2009   HGB 13.1 08/07/2009   HCT 37.9 08/07/2009   MCV 96.2 08/07/2009   PLT 291.0 08/07/2009      Component Value Date/Time   NA 143 08/07/2009 1008   K 5.0 08/07/2009 1008   CL 105 08/07/2009 1008   CO2 32 08/07/2009 1008   GLUCOSE 76 08/07/2009 1008   BUN 12 08/07/2009 1008   CREATININE 0.8 08/07/2009 1008   CALCIUM 9.3 08/07/2009 1008   PROT 7.3 08/07/2009 1008   ALBUMIN 4.1 08/07/2009 1008   AST 23 08/07/2009 1008   ALT 15 08/07/2009 1008   ALKPHOS 45 08/07/2009 1008   BILITOT 0.6 08/07/2009 1008   GFRNONAA 80.17 08/07/2009 1008   GFRAA 96 07/04/2007 1111   Lab Results  Component Value Date   CHOL 221* 08/07/2009   HDL 38.00* 08/07/2009   LDLCALC 135* 07/04/2007   LDLDIRECT 144.6 08/07/2009   TRIG 159.0* 08/07/2009   CHOLHDL 6 08/07/2009   No results found for: HGBA1C No results found for: VITAMINB12 Lab Results  Component Value Date   TSH 2.04 08/07/2009        ASSESSMENT AND PLAN  Multiple sclerosis  OVERACTIVE BLADDER  Depression with anxiety  Chronic  fatigue  Dysphagia  Ataxic gait  In summary, Franny Selvage is a 59 year old woman with multiple sclerosis who has had much more difficulty with fatigue over the past year and also has had more mood disturbance gait issues and bladder dysfunction over the past year. Her fatigue responds little bit to stimulants and she also got a benefit from IV steroids. She is noting more stress and she seems to be having some issues with her husband which is likely adding to her depression. It is uncertain how much her mood is adding to her fatigue. We discussed trying to be more active as that may help her fatigue some as well. She will continue on the stimulant. I also refilled her Xanax.  She will return to see me in 4 or 5 months or sooner if she has new or worsening neurologic symptoms.   This was a 45 minute face-to-face visit with greater than half of the time spent counseling and coordinating care related to her multiple sclerosis, fatigue and mood issues.   Chayce Robbins A. Felecia Shelling, MD, PhD 10/30/537, 7:67 PM Certified in Neurology, Clinical Neurophysiology, Sleep Medicine, Pain Medicine and Neuroimaging  [facility] [address], [place], [place] [address] [phone]

## 2014-04-10 ENCOUNTER — Other Ambulatory Visit: Payer: Self-pay

## 2014-04-10 ENCOUNTER — Telehealth: Payer: Self-pay | Admitting: Neurology

## 2014-04-10 MED ORDER — GABAPENTIN 600 MG PO TABS: 600.0000 mg | ORAL_TABLET | Freq: Four times a day (QID) | ORAL | Status: DC

## 2014-04-10 NOTE — Telephone Encounter (Signed)
Patient's stated she still having problems with [employer] and the fax # given to her by Faith to fax over request form for Rx for GABAPENTIN.  Please call and advise.

## 2014-04-10 NOTE — Telephone Encounter (Signed)
[name] says her Tramadol was r/f, but her Gabapentin was not.  Should go to [facility], phone # [phone]

## 2014-04-10 NOTE — Telephone Encounter (Signed)
Rx has been sent to [facility].  I called the patient back.  She is aware.

## 2014-04-16 ENCOUNTER — Other Ambulatory Visit: Payer: Self-pay

## 2014-04-16 MED ORDER — TRAMADOL HCL 50 MG PO TABS: 50.0000 mg | ORAL_TABLET | Freq: Four times a day (QID) | ORAL | Status: DC | PRN

## 2014-04-16 NOTE — Telephone Encounter (Signed)
Patient stated she's still waiting on Rx refill for traMADol (ULTRAM) 50 MG tablet from [facility].  She received Rx Gabapentin on yesterday.  Please call and advise.

## 2014-04-17 NOTE — Telephone Encounter (Signed)
Faith said the patient's Rx for Tramadol was already sent.  I called [employer], they did have the Rx.  Spoke with Mardene Celeste. They will process order today.  They apparently had difficulty reading the original order, but have clarified it.  Ref # [id number].  I called the patient back to advise.  Got no answer.  Left message.

## 2014-04-23 ENCOUNTER — Other Ambulatory Visit: Payer: Self-pay | Admitting: Neurology

## 2014-04-24 ENCOUNTER — Telehealth: Payer: Self-pay | Admitting: *Deleted

## 2014-04-24 NOTE — Telephone Encounter (Signed)
Patient calling stating that she spoke to someone yesterday about Klonopin. The patient pharmacy is telling her they did not receive anything. Please resend.

## 2014-04-24 NOTE — Telephone Encounter (Signed)
I called the pharmacy and spoke with Clair Gulling.  He said they do have the Rx, but they cannot fill it until tomorrow.  They will proceed with order tomorrow and contact patient as soon as meds are ready for pick up.  I called the patient back.  Says she did call the pharmacy again, and is aware the Rx will be filled tomorrow.

## 2014-05-06 ENCOUNTER — Other Ambulatory Visit: Payer: Self-pay | Admitting: *Deleted

## 2014-05-06 ENCOUNTER — Telehealth: Payer: Self-pay | Admitting: Neurology

## 2014-05-06 MED ORDER — AMPHETAMINE-DEXTROAMPHET ER 30 MG PO CP24: 30.0000 mg | ORAL_CAPSULE | Freq: Every day | ORAL | Status: DC

## 2014-05-06 NOTE — Telephone Encounter (Signed)
Rx. printed, signed, in [place] office to go up front.  Sheri Burns is aware/fim

## 2014-05-06 NOTE — Telephone Encounter (Signed)
Patient requesting Rx refill for amphetamine-dextroamphetamine (ADDERALL XR) 30 MG 24 hr capsule.  Please call when ready for pick up.

## 2014-05-09 ENCOUNTER — Telehealth: Payer: Self-pay

## 2014-05-09 NOTE — Telephone Encounter (Signed)
Called patient and informed Rx ready for pick up at front desk. Patient verbalized understanding. She states she will have her spouse to pick it up for her.

## 2014-05-15 ENCOUNTER — Telehealth: Payer: Self-pay | Admitting: Neurology

## 2014-05-15 MED ORDER — DULOXETINE HCL 60 MG PO CPEP: 60.0000 mg | ORAL_CAPSULE | Freq: Every day | ORAL | Status: DC

## 2014-05-15 NOTE — Telephone Encounter (Signed)
Patient requesting Rx imipramine (TOFRANIL) 25 MG tablet sent to [facility] on [address].  Please call and advise.

## 2014-05-15 NOTE — Telephone Encounter (Signed)
I called the pharmacy.  Spoke with Robin.  She said the patient has never filled Imipramine, and she was requesting Cymbalta (Duloxetine).  This Rx has been sent.  I called the patient back, got no answer.  Left message.

## 2014-05-20 ENCOUNTER — Other Ambulatory Visit: Payer: Self-pay | Admitting: Neurology

## 2014-05-20 MED ORDER — IMIPRAMINE HCL 25 MG PO TABS: 50.0000 mg | ORAL_TABLET | Freq: Every day | ORAL | Status: DC

## 2014-05-20 NOTE — Telephone Encounter (Signed)
Patient is calling as she has changed her nsurance company to [facility] and she needs Rx Imipramine HCL 25 mg tablet 2 at bedtime transferred to [place].  Please call.

## 2014-05-20 NOTE — Telephone Encounter (Signed)
Pharmacy has no record of this Rx from Dr Felecia Shelling.  Okay to fill?  Please advise.  Thank you.

## 2014-06-11 ENCOUNTER — Telehealth: Payer: Self-pay | Admitting: Neurology

## 2014-06-11 MED ORDER — AMPHETAMINE-DEXTROAMPHET ER 30 MG PO CP24: 30.0000 mg | ORAL_CAPSULE | Freq: Every day | ORAL | Status: DC

## 2014-06-11 NOTE — Telephone Encounter (Signed)
Spoke with Sheri Burns and advised rx. will be ready this afternoon./fim

## 2014-06-11 NOTE — Telephone Encounter (Signed)
Patient requesting refill for Rx amphetamine-dextroamphetamine (ADDERALL XR) 30 MG 24 hr capsule.  Please call when ready for pick up.

## 2014-06-11 NOTE — Telephone Encounter (Signed)
Rx. printed, signed, up front [place] for Crisol to pick up/fim

## 2014-06-13 ENCOUNTER — Telehealth: Payer: Self-pay | Admitting: Neurology

## 2014-06-13 ENCOUNTER — Other Ambulatory Visit: Payer: Self-pay | Admitting: *Deleted

## 2014-06-13 MED ORDER — AMPHETAMINE-DEXTROAMPHET ER 30 MG PO CP24
30.0000 mg | ORAL_CAPSULE | Freq: Every day | ORAL | Status: DC
Start: 2014-06-13 — End: 2014-07-08

## 2014-06-13 NOTE — Telephone Encounter (Signed)
Pt is calling stating she did not get the Rx for amphetamine-dextroamphetamine (ADDERALL XR) 30 MG 24 hr capsule, the only Rx in the evnlope was for clonazePAM (KLONOPIN) 1 MG tablet.  Please call and advise.

## 2014-06-13 NOTE — Telephone Encounter (Signed)
Adderall and Clonazepam were printed 4-5--16.  Per pt. and pharmacy, only Clonazepam rx. was given.  Adderall re-printed, signed, up front [place] for Elsa to pick up/fim

## 2014-06-17 ENCOUNTER — Ambulatory Visit: Payer: Medicare Other | Admitting: Nurse Practitioner

## 2014-06-28 ENCOUNTER — Ambulatory Visit: Payer: Self-pay | Admitting: Neurology

## 2014-07-08 ENCOUNTER — Encounter: Payer: Self-pay | Admitting: Neurology

## 2014-07-08 ENCOUNTER — Ambulatory Visit (INDEPENDENT_AMBULATORY_CARE_PROVIDER_SITE_OTHER): Payer: Commercial Managed Care - HMO | Admitting: Neurology

## 2014-07-08 VITALS — BP 136/72 | HR 74 | Resp 14 | Ht 66.0 in | Wt 124.2 lb

## 2014-07-08 DIAGNOSIS — N318 Other neuromuscular dysfunction of bladder: Secondary | ICD-10-CM

## 2014-07-08 DIAGNOSIS — R5382 Chronic fatigue, unspecified: Secondary | ICD-10-CM

## 2014-07-08 DIAGNOSIS — R1310 Dysphagia, unspecified: Secondary | ICD-10-CM | POA: Diagnosis not present

## 2014-07-08 DIAGNOSIS — R260 Ataxic gait: Secondary | ICD-10-CM | POA: Diagnosis not present

## 2014-07-08 DIAGNOSIS — F418 Other specified anxiety disorders: Secondary | ICD-10-CM

## 2014-07-08 DIAGNOSIS — Z79899 Other long term (current) drug therapy: Secondary | ICD-10-CM | POA: Diagnosis not present

## 2014-07-08 DIAGNOSIS — G35 Multiple sclerosis: Secondary | ICD-10-CM

## 2014-07-08 MED ORDER — AMPHETAMINE-DEXTROAMPHET ER 30 MG PO CP24: 30.0000 mg | ORAL_CAPSULE | Freq: Every day | ORAL | Status: DC

## 2014-07-08 NOTE — Progress Notes (Signed)
[facility]  PATIENT: Sheri Burns DOB: [birth year]  REFERRING CLINICIAN: Thressa Sheller  HISTORY FROM: Patient REASON FOR VISIT: MS   HISTORICAL  CHIEF COMPLAINT:  Chief Complaint  Patient presents with  . Multiple Sclerosis    Sts. fatigue, leg numbness, and electric shock sensations in bottoms of feet are worse since September.  She feels she is declining cognitively--memory is worse--for example, she forgets where the drinking glasses are in her kitchen.  She would like to discuss starting an MS med/fim    HISTORY OF PRESENT ILLNESS:  Sheri Burns is a 59 year old woman with MS.   Since late 2015, she has been feeling worse with a lot more fatigue and left leg an bilateral foot pain/numbness /dysesthesia (burning).   We did 2-3 days IV SoluMedrol due to some gsit ataxia in November 2015.  She feels she is doing worse cognitively.     MS History:  who was diagnosed with multiple sclerosis more than 20 years ago.  In the past, she was treated with Betaseron and Copaxone but has not been on any disease modifying therapy for about 8 years. During this time, she has had occasional  Exacerbations with more fatigue and gait issues.  She also has noted mild cognitive dysfunction worsening over the past 5 or 6 years. Her MRI of the brain performed 08/31/2012 shows foci that are predominantly periventricular. They are consistent with the diagnosis of multiple sclerosis. When this MRI was compared to an MRI dated 10/12/2010, there was one additional small periventricular focus. There were no acute findings on either MRI.   In November 2015, she had an exacerbation with severe fatigue, worsened gait and dysphagia.     Gait/strength/sensation:    She has had gait issues x many years but she feels gait is worse the past few years.    No falls since steroids in November.  She reports that her left leg has pain/dysesthesia and both feet have burning dysesthesia.    She also  some weakness and stiffness. Additionally her left knee is painful.     Dysphagia:   She has had dysphagia since last year - unchanged over past 3-4 months.   She has the most trouble swallowing foods like bread and meat and does well with liquids.    She notes that it takes her longer to chew and swallow.  Food sometimes gets stuck and she occasionally coughs after eating.     Bladder:   She has bladder dysfunction, helped by oxybutynin.    No recent incontinence   On oxybutynin, frequency and incontinence improved but hesitancy worsened.     Fatigue/Sleep:   She notes a lot of fatigue, physical and cognitive, worse than last year.    She sleeps ok most nights but often wakes up when husband wakes up to use the bathroom.  She recently changed to her on bedroom and now sleeps better (up to 9 hours).   However, fatigue did not improve.     Cognitive:   She has had more cognitive issues...specifically calculations and verbal fluency and short term memory.   Focus and attention are poor .  Cognition, fatigue and attention are all better with Adderall.  Mood:  She feels mood is doing worse than last year, despite Cymbalta.  Has more stress with husband's issues (she feels he constantly talks about his health issues and it drives here crazy).    She feels Xanax helps anxiety some but used to help  more.  Cymbalta has not helped her much.      She also has several problems that we have followed that are unrelated including knee pain, low back and radicular leg pain and hip pain.   In the past, she has received right trochanteric bursa injections with improvement of hip pain. An epidural steroid injection did not significantly help the back and leg pain.  The hip pain has resolved and she still has left knee pain.      REVIEW OF SYSTEMS:  Constitutional: No fevers, chills, sweats, or change in appetite Eyes: No visual changes, double vision, eye pain Ear, nose and throat: No hearing loss, ear pain, nasal  congestion, sore throat Cardiovascular: No chest pain, palpitations Respiratory:  No shortness of breath at rest or with exertion.   No wheezes GastrointestinaI: No nausea, vomiting, diarrhea.  Has constipation Genitourinary:  see above. Musculoskeletal:  reports pain in knees.   Reports neck pain and back pain Integumentary: No rash, pruritus, skin lesions Neurological: as above Psychiatric: Depression and  Anxiety noted Endocrine: No palpitations, diaphoresis, change in appetite, change in weigh or increased thirst Hematologic/Lymphatic:  No anemia, purpura, petechiae. Allergic/Immunologic: No itchy/runny eyes, nasal congestion, recent allergic reactions, rashes  ALLERGIES: Allergies  Allergen Reactions  . Codeine Nausea And Vomiting    vomiting  . Demerol [Meperidine] Nausea And Vomiting  . Penicillins   . Stadol [Butorphanol] Other (See Comments)    Per patient psychotic event  . Sulfonamide Derivatives Hives    HOME MEDICATIONS: Outpatient Prescriptions Prior to Visit  Medication Sig Dispense Refill  . acetaminophen (TYLENOL) 500 MG tablet Take 500 mg by mouth 4 (four) times daily.    Marland Kitchen ALPRAZolam (XANAX) 0.5 MG tablet Take 1 tablet (0.5 mg total) by mouth 2 (two) times daily. 60 tablet 5  . AMITIZA 24 MCG capsule Take 1 capsule by mouth 2 (two) times daily.    Marland Kitchen amphetamine-dextroamphetamine (ADDERALL XR) 30 MG 24 hr capsule Take 1 capsule (30 mg total) by mouth daily. 30 capsule 0  . Calcium Carb-Cholecalciferol (CALCIUM 600 + D PO) Take 1 tablet by mouth daily.    . clonazePAM (KLONOPIN) 1 MG tablet TAKE 1 TABLET IN THE MORNING, 1 TABLET IN THE EVENING, AND 2 TABLETS AT BEDTIME (MAX 3 PER DAY) 360 tablet 1  . DULoxetine (CYMBALTA) 60 MG capsule Take 1 capsule (60 mg total) by mouth daily. 60 capsule 6  . fesoterodine (TOVIAZ) 4 MG TB24 Take 4 mg by mouth daily.    Marland Kitchen gabapentin (NEURONTIN) 600 MG tablet Take 1 tablet (600 mg total) by mouth 4 (four) times daily. 360 tablet  3  . hydrocodone-acetaminophen (LORCET PLUS) 7.5-650 MG per tablet Take 1 tablet by mouth every 6 (six) hours as needed.    Marland Kitchen imipramine (TOFRANIL) 25 MG tablet Take 2 tablets (50 mg total) by mouth at bedtime. 60 tablet 6  . Multiple Vitamin (MULTIVITAMIN) tablet Take 1 tablet by mouth daily.    . nitrofurantoin, macrocrystal-monohydrate, (MACROBID) 100 MG capsule Take 1 capsule (100 mg total) by mouth 2 (two) times daily. 14 capsule 0  . oxybutynin (DITROPAN-XL) 10 MG 24 hr tablet Take 1 tablet by mouth daily.    . rizatriptan (MAXALT) 10 MG tablet Take 10 mg by mouth as needed. May repeat in 2 hours if needed    . traMADol (ULTRAM) 50 MG tablet Take 1 tablet (50 mg total) by mouth every 6 (six) hours as needed. 120 tablet 5   No  facility-administered medications prior to visit.    PAST MEDICAL HISTORY: Past Medical History  Diagnosis Date  . Colon polyp   . Headache(784.0)   . Osteoarthritis   . Multiple sclerosis since 1992    Dr. Arlice Colt in [facility]  . Ulcerative proctitis   . Overactive bladder     sees Dr. Nicki Reaper McDiarmid   . Osteoporosis     last DEXA 08-28-07  . Gynecological examination     sees Dr. Elyse Hsu   . Multiple thyroid nodules 1995    per pt. resolved on its own follwed by endo then released  . GERD (gastroesophageal reflux disease)   . Depression   . Bulging lumbar disc 1980  . Pancreatitis 1977  . Neurogenic bladder disorder 12/2009    w/ [facility] dx. Dr. McDiarmid  . Neuropathy   . Vision abnormalities     PAST SURGICAL HISTORY: Past Surgical History  Procedure Laterality Date  . Appendectomy    . Tonsillectomy    . Widom teeth removal    . Dilation and curettage of uterus    . Left knee surgery  1978    to remove bone tumor    . Cholecystectomy    . Laparoscopy      x 3  . Nasal turbinate reduction    . Breast ruptured  left    ductal cyst  . Vaginal cystocele repair      anterior  . Colonoscopy  01/10/09    repeat  in 5 yrs Dr. Wilford Corner  . Pubo vaginal sling  10/2003  . Vaginal hysterectomy  1995    AUB & Endometriosis    FAMILY HISTORY: Family History  Problem Relation Age of Onset  . Osteoporosis Mother     severe  . Cancer Mother 39    colon /Breast also m. niece at 46's  . Osteopenia Sister     X 2    SOCIAL HISTORY:  History   Social History  . Marital Status: Married    Spouse Name: Jori Moll  . Number of Children: 1  . Years of Education: 16   Occupational History  . BS Nursing   . [employer] volunteer    Social History Main Topics  . Smoking status: Former Smoker -- 1.00 packs/day for 5 years    Types: Cigarettes    Quit date: 03/09/1979  . Smokeless tobacco: Never Used  . Alcohol Use: No  . Drug Use: No  . Sexual Activity: Not Currently    Birth Control/ Protection: Surgical   Other Topics Concern  . Not on file   Social History Narrative     PHYSICAL EXAM  Filed Vitals:   07/08/14 1106  BP: 136/72  Pulse: 74  Resp: 14  Height: 5\' 6"  (1.676 m)  Weight: 124 lb 3.2 oz (56.337 kg)    Body mass index is 20.06 kg/(m^2).   General: The patient is well-developed and well-nourished and in no acute distress  Neurologic Exam  Mental status: The patient is alert and oriented x 3 at the time of the examination. The patient has apparent normal recent and remote memory, with an apparently normal attention span and concentration ability.   Speech is normal.  Cranial nerves: Extraocular movements are full. Pupils are equal, round, and reactive to light and accomodation.   Facial symmetry is present. There is good facial sensation to soft touch bilaterally.Facial strength is normal.  Trapezius and sternocleidomastoid strength is normal. No  dysarthria is noted.  The tongue is midline, and the patient has symmetric elevation of the soft palate. No obvious hearing deficits are noted.  Motor:  Muscle bulk is normal and tone is increased in legs. Strength is  5 / 5 in  all 4 extremities.   Sensory: Sensory testing is intact to pinprick, soft touch, vibration sensation, and position sense on all 4 extremities.  Coordination: Cerebellar testing reveals good finger-nose-finger and slightly reduced heel-to-shin bilaterally.  Gait and station: Station is stable and gait is mildly wide and arthritic. Tandem gait is wide. Romberg is negative.   Reflexes: Deep tendon reflexes are symmetric and increased in knees (spread) and ankles (2 beatrs clonus nonsustained).Marland Kitchen     DIAGNOSTIC DATA (LABS, IMAGING, TESTING) - I reviewed patient records, labs, notes, testing and imaging myself where available.      ASSESSMENT AND PLAN  Multiple sclerosis - Plan: CBC with Differential, Hepatic function panel, Quantiferon tb gold assay  OVERACTIVE BLADDER  Depression with anxiety  Chronic fatigue  Ataxic gait  Dysphagia  High risk medication use - Plan: CBC with Differential, Hepatic function panel, Quantiferon tb gold assay  1.  Due to bladder hesitancy, stop imipramine 2.   Start Aubagio (filled out SRF , will send in if labs ok) 3.  Renew Adderall 4.   Stay active, exercise 5.   I discusssed psych referral but she does not want to at this point 6.  If tingling worsens, consider lamotrigine rtc 4 months, sooner if worse  Kearie Mennen A. Felecia Shelling, MD, PhD 06/07/7406, 14:48 AM Certified in Neurology, Clinical Neurophysiology, Sleep Medicine, Pain Medicine and Neuroimaging  [facility] [address], [place] [employer] [place], [place] [address] [phone]

## 2014-07-09 LAB — CBC WITH DIFFERENTIAL/PLATELET
Basophils Absolute: 0.1 10*3/uL (ref 0.0–0.2)
Basos: 1 %
EOS (ABSOLUTE): 0.7 10*3/uL — ABNORMAL HIGH (ref 0.0–0.4)
Eos: 7 %
Hematocrit: 40.3 % (ref 34.0–46.6)
Hemoglobin: 13.3 g/dL (ref 11.1–15.9)
IMMATURE GRANULOCYTES: 0 %
Immature Grans (Abs): 0 10*3/uL (ref 0.0–0.1)
LYMPHS: 32 %
Lymphocytes Absolute: 3.1 10*3/uL (ref 0.7–3.1)
MCH: 31.6 pg (ref 26.6–33.0)
MCHC: 33 g/dL (ref 31.5–35.7)
MCV: 96 fL (ref 79–97)
MONOCYTES: 8 %
Monocytes Absolute: 0.7 10*3/uL (ref 0.1–0.9)
NEUTROS PCT: 52 %
Neutrophils Absolute: 5.1 10*3/uL (ref 1.4–7.0)
Platelets: 322 10*3/uL (ref 150–379)
RBC: 4.21 x10E6/uL (ref 3.77–5.28)
RDW: 13.2 % (ref 12.3–15.4)
WBC: 9.8 10*3/uL (ref 3.4–10.8)

## 2014-07-09 LAB — HEPATIC FUNCTION PANEL
ALBUMIN: 4.3 g/dL (ref 3.6–4.8)
ALK PHOS: 73 IU/L (ref 39–117)
ALT: 12 IU/L (ref 0–32)
AST: 18 IU/L (ref 0–40)
BILIRUBIN TOTAL: 0.3 mg/dL (ref 0.0–1.2)
Bilirubin, Direct: 0.07 mg/dL (ref 0.00–0.40)
TOTAL PROTEIN: 7 g/dL (ref 6.0–8.5)

## 2014-07-10 LAB — QUANTIFERON TB GOLD ASSAY (BLOOD)

## 2014-07-10 LAB — QUANTIFERON IN TUBE
QFT TB AG MINUS NIL VALUE: <0 IU/mL
QUANTIFERON TB AG VALUE: 0.06 IU/mL
QUANTIFERON TB GOLD: NEGATIVE
Quantiferon Nil Value: 0.09 IU/mL

## 2014-07-11 NOTE — Telephone Encounter (Signed)
-----   Message from Britt Bottom, MD sent at 07/10/2014  5:44 PM EDT ----- Lab tests are fine so we can send in [facility]

## 2014-07-11 NOTE — Telephone Encounter (Signed)
I have spoken with Sheri Burns this am and per RAS, advsied that labwork was ok, so I have faxed Aubagio start form to [facility]/fim

## 2014-07-15 ENCOUNTER — Other Ambulatory Visit: Payer: Self-pay

## 2014-07-15 DIAGNOSIS — Z1231 Encounter for screening mammogram for malignant neoplasm of breast: Secondary | ICD-10-CM

## 2014-07-23 ENCOUNTER — Telehealth: Payer: Self-pay

## 2014-07-23 NOTE — Telephone Encounter (Signed)
[employer] has approved the request for coverage on Aubagio effective until 07/22/2016 Ref # [id number] Ref ID# [id number]

## 2014-07-26 ENCOUNTER — Ambulatory Visit
Admission: RE | Admit: 2014-07-26 | Discharge: 2014-07-26 | Disposition: A | Discharge status: Home / Self Care | Payer: Commercial Managed Care - HMO | Source: Ambulatory Visit

## 2014-07-26 DIAGNOSIS — Z1231 Encounter for screening mammogram for malignant neoplasm of breast: Secondary | ICD-10-CM

## 2014-08-12 ENCOUNTER — Other Ambulatory Visit: Payer: Self-pay | Admitting: Neurology

## 2014-08-12 MED ORDER — AMPHETAMINE-DEXTROAMPHET ER 30 MG PO CP24: 30.0000 mg | ORAL_CAPSULE | Freq: Every day | ORAL | Status: DC

## 2014-08-12 NOTE — Telephone Encounter (Signed)
Request entered, forwarded to provider for approval.  

## 2014-08-12 NOTE — Telephone Encounter (Signed)
Patient called and requested a refill on Rx. amphetamine-dextroamphetamine (ADDERALL XR) 30 MG 24 hr capsule. Informed her it would be ready for pick up within 24 hours unless notified otherwise by the nurse.

## 2014-08-19 ENCOUNTER — Telehealth: Payer: Self-pay | Admitting: Neurology

## 2014-08-19 MED ORDER — DICYCLOMINE HCL 10 MG PO CAPS: 10.0000 mg | ORAL_CAPSULE | Freq: Three times a day (TID) | ORAL | Status: DC

## 2014-08-19 NOTE — Telephone Encounter (Signed)
I have spoken with Sheri Burns this afternoon.  She c/o abd. pain, h/a, bilat leg pain after starting Aubagio.  She denies n/v/d.  She sts. she would like to try lower dose of Aubagio if appropriate.  Per RAS, ok to try 14mg  qod, or can cut tab in half and take 1/2 tab daily.  Also per RAS, ok to send in Bentyl 10mg  tid for abd. cramping.  [name] verbalized understanding of same, sts. will call me back if sx. persist.  Bentyl rx. escribed to [facility] per her request/fim

## 2014-08-19 NOTE — Telephone Encounter (Signed)
Patient called regarding having serious side effects to Aubagio. These include for the past 7 days severe stomach pain, pain in legs which is different than usual, headaches. The symptoms started on day 4. Stopped the medication on Saturday. Please call and advise.  Patient can be reached [phone].

## 2014-09-20 ENCOUNTER — Telehealth: Payer: Self-pay | Admitting: Neurology

## 2014-09-20 ENCOUNTER — Other Ambulatory Visit: Payer: Self-pay

## 2014-09-20 MED ORDER — AMPHETAMINE-DEXTROAMPHET ER 30 MG PO CP24: 30.0000 mg | ORAL_CAPSULE | Freq: Every day | ORAL | Status: DC

## 2014-09-20 NOTE — Telephone Encounter (Signed)
Patent would like a prescription written for Hydrocodone 10/325mg  with instructions of one tablet three times daily as needed.  Says it was last filled in April 2015.  Please advise.  Thank you.   Adderall Request has been entered separately.

## 2014-09-20 NOTE — Telephone Encounter (Signed)
Pt called would like Rx for generic Aderall. As well as Vicodine . May call pt [phone].

## 2014-09-23 ENCOUNTER — Encounter: Payer: Self-pay | Admitting: *Deleted

## 2014-09-23 ENCOUNTER — Other Ambulatory Visit: Payer: Self-pay

## 2014-09-23 MED ORDER — HYDROCODONE-ACETAMINOPHEN 10-325 MG PO TABS: 1.0000 | ORAL_TABLET | Freq: Three times a day (TID) | ORAL | Status: DC | PRN

## 2014-09-23 NOTE — Progress Notes (Signed)
Adderall rx. up front [facility]im 

## 2014-09-23 NOTE — Progress Notes (Unsigned)
Hydrocodone rx. up front [facility] 

## 2014-09-23 NOTE — Telephone Encounter (Signed)
Ok to fill that

## 2014-09-23 NOTE — Telephone Encounter (Signed)
Rx entered, per provider instruction.  Request forwarded to MD for signature.

## 2014-09-23 NOTE — Progress Notes (Unsigned)
Subjective:    Patient ID: Sheri Burns is a 59 y.o. female.  HPI {Common ambulatory SmartLinks:[id number]}  Review of Systems  Objective:  Neurologic Exam  Physical Exam  Assessment:   ***  Plan:   ***

## 2014-10-09 ENCOUNTER — Ambulatory Visit (INDEPENDENT_AMBULATORY_CARE_PROVIDER_SITE_OTHER): Payer: Commercial Managed Care - HMO | Admitting: Neurology

## 2014-10-09 ENCOUNTER — Encounter: Payer: Self-pay | Admitting: Neurology

## 2014-10-09 VITALS — BP 106/52 | HR 103 | Ht 66.0 in | Wt 122.8 lb

## 2014-10-09 DIAGNOSIS — F418 Other specified anxiety disorders: Secondary | ICD-10-CM | POA: Diagnosis not present

## 2014-10-09 DIAGNOSIS — R5382 Chronic fatigue, unspecified: Secondary | ICD-10-CM

## 2014-10-09 DIAGNOSIS — G35 Multiple sclerosis: Secondary | ICD-10-CM

## 2014-10-09 DIAGNOSIS — G35D Multiple sclerosis, unspecified: Secondary | ICD-10-CM

## 2014-10-09 DIAGNOSIS — N318 Other neuromuscular dysfunction of bladder: Secondary | ICD-10-CM | POA: Diagnosis not present

## 2014-10-09 DIAGNOSIS — R260 Ataxic gait: Secondary | ICD-10-CM

## 2014-10-09 DIAGNOSIS — Z79899 Other long term (current) drug therapy: Secondary | ICD-10-CM

## 2014-10-09 MED ORDER — ALPRAZOLAM 0.5 MG PO TABS: 0.5000 mg | ORAL_TABLET | Freq: Two times a day (BID) | ORAL | Status: DC

## 2014-10-09 MED ORDER — LAMOTRIGINE 100 MG PO TABS: 100.0000 mg | ORAL_TABLET | Freq: Every day | ORAL | Status: DC

## 2014-10-09 MED ORDER — LAMOTRIGINE 25 MG PO TABS: ORAL_TABLET | ORAL | Status: DC

## 2014-10-09 MED ORDER — AMPHETAMINE-DEXTROAMPHET ER 30 MG PO CP24: 30.0000 mg | ORAL_CAPSULE | Freq: Every day | ORAL | Status: DC

## 2014-10-09 NOTE — Patient Instructions (Signed)
The pharmacy has the prescription of lamotrigine 25 mg. Please take it as follows: For 1 week take 1 pill once a day. The second week, take 1 pill twice a day. The third week, take 1 pill in the morning and 2 at bedtime or evening. The fourth week take 2 pills twice a day.  I have also give you a paper prescription for lamotrigine 100 mg tablets after the first 4 weeks you should take 100 mg twice a day.  Most people tolerate lamotrigine very well. Some will get a rash. If you do get a significant rash stop the medicine immediately and let us know. 

## 2014-10-09 NOTE — Progress Notes (Signed)
[facility]  PATIENT: Sheri Burns DOB: 13-Jun-1955  REFERRING CLINICIAN: Thressa Sheller  HISTORY FROM: Patient REASON FOR VISIT: MS   HISTORICAL  CHIEF COMPLAINT:  Chief Complaint  Patient presents with  . Follow-up    Aubagio? Needs liver profile today     HISTORY OF PRESENT ILLNESS:  Sheri Burns is a 58 year old woman with MS.   The past few months she has felt much more exhausted and is more heat intolerant.  She has gotten out of the house less due to the heat.   Legs are hurting more.   She started Aubagio one month ago and notes no side effects,     Gait/strength/sensation:    She feels gait is stable.   She stumbles some but no recent fall.    .  She reports that her left leg has pain/dysesthesia and both feet have burning dysesthesia.  She takes gabapentin 600 mg po qid and is not sure she gets much benefit.    She has mild weakness and stiffness in her legs    Dysphagia:   She has had dysphagia since last year but it is unchanged.   She has noted that she notes less taste.      She has the most trouble swallowing foods like bread.    She notes that it takes her longer to chew and swallow.  Food sometimes gets stuck and she occasionally coughs after eating.     Bladder:   She has bladder dysfunction, helped by oxybutynin.    No recent incontinence   On oxybutynin, frequency and incontinence improved but hesitancy worsened.     Fatigue/Sleep:   She is having more fatigue and more heat intolerance.    She often feels drained by mid afternoon.    Adderall helps some.   She feels fatigue is both physical and cognitive.    She sleeps ok most nights but often wakes up when husband wakes up to use the bathroom.  She recently changed to her on bedroom and now sleeps better (up to 9 hours).       Cognitive:   She feels she is doing worse with cognition and is noting more difficulty coming up with the right words.  She also notes difficulties with  calculatoions and STM.    Focus and attention are poor .  Cognition, fatigue and attention are all better with Adderall.  Mood:  She feels mood is doing worse than last year, despite Cymbalta.  She reports more stress with husband's issues (she feels he constantly talks about his health issues and since he retired he is home 24/7...    She feels Xanax helps anxiety some but used to help more.  Cymbalta has not helped her much.      MS History:  She was diagnosed with multiple sclerosis more than 20 years ago.  In the past, she was treated with Betaseron and Copaxone but has not been on any disease modifying therapy for about 8 years. During this time, she has had occasional  Exacerbations with more fatigue and gait issues.  She also has noted mild cognitive dysfunction worsening over the past 5 or 6 years. Her MRI of the brain performed 08/31/2012 shows foci that are predominantly periventricular. They are consistent with the diagnosis of multiple sclerosis. When this MRI was compared to an MRI dated 10/12/2010, there was one additional small periventricular focus. There were no acute findings on either MRI.   In November  2015, she had an exacerbation with severe fatigue, worsened gait and dysphagia.    She started Aubagio 09/09/14.        REVIEW OF SYSTEMS:  Constitutional: No fevers, chills, sweats, or change in appetite.  She notes a lot of fatigue. She is sleeping well. Eyes: No visual changes, double vision, eye pain Ear, nose and throat: No hearing loss, ear pain, nasal congestion, sore throat Cardiovascular: No chest pain, palpitations Respiratory:  No shortness of breath at rest or with exertion.   No wheezes GastrointestinaI: No nausea, vomiting, diarrhea.  Has constipation Genitourinary:  see above. Musculoskeletal:  reports pain in knees.   Reports neck pain and back pain Integumentary: No rash, pruritus, skin lesions Neurological: as above Psychiatric: Depression and  Anxiety  noted Endocrine: No palpitations, diaphoresis, change in appetite, change in weigh or increased thirst Hematologic/Lymphatic:  No anemia, purpura, petechiae. Allergic/Immunologic: No itchy/runny eyes, nasal congestion, recent allergic reactions, rashes  ALLERGIES: Allergies  Allergen Reactions  . Codeine Nausea And Vomiting    vomiting  . Demerol [Meperidine] Nausea And Vomiting  . Penicillins   . Stadol [Butorphanol] Other (See Comments)    Per patient psychotic event  . Sulfonamide Derivatives Hives    HOME MEDICATIONS: Outpatient Prescriptions Prior to Visit  Medication Sig Dispense Refill  . acetaminophen (TYLENOL) 500 MG tablet Take 500 mg by mouth 4 (four) times daily.    Marland Kitchen ALPRAZolam (XANAX) 0.5 MG tablet Take 1 tablet (0.5 mg total) by mouth 2 (two) times daily. 60 tablet 5  . amphetamine-dextroamphetamine (ADDERALL XR) 30 MG 24 hr capsule Take 1 capsule (30 mg total) by mouth daily. 30 capsule 0  . Calcium Carb-Cholecalciferol (CALCIUM 600 + D PO) Take 1 tablet by mouth daily.    . clonazePAM (KLONOPIN) 1 MG tablet TAKE 1 TABLET IN THE MORNING, 1 TABLET IN THE EVENING, AND 2 TABLETS AT BEDTIME (MAX 3 PER DAY) (Patient taking differently: 3 tablets at bedtime) 360 tablet 1  . dicyclomine (BENTYL) 10 MG capsule Take 1 capsule (10 mg total) by mouth 4 (four) times daily -  before meals and at bedtime. 90 capsule 1  . DULoxetine (CYMBALTA) 60 MG capsule Take 1 capsule (60 mg total) by mouth daily. 60 capsule 6  . gabapentin (NEURONTIN) 600 MG tablet Take 1 tablet (600 mg total) by mouth 4 (four) times daily. 360 tablet 3  . HYDROcodone-acetaminophen (NORCO) 10-325 MG per tablet Take 1 tablet by mouth 3 (three) times daily as needed. 90 tablet 0  . Multiple Vitamin (MULTIVITAMIN) tablet Take 1 tablet by mouth daily.    Marland Kitchen oxybutynin (DITROPAN-XL) 10 MG 24 hr tablet Take 1 tablet by mouth daily.    . Teriflunomide 14 MG TABS Take 14 mg by mouth daily.    . traMADol (ULTRAM) 50 MG  tablet Take 1 tablet (50 mg total) by mouth every 6 (six) hours as needed. 120 tablet 5  . AMITIZA 24 MCG capsule Take 1 capsule by mouth 2 (two) times daily.    . fesoterodine (TOVIAZ) 4 MG TB24 Take 4 mg by mouth daily.    . nitrofurantoin, macrocrystal-monohydrate, (MACROBID) 100 MG capsule Take 1 capsule (100 mg total) by mouth 2 (two) times daily. (Patient not taking: Reported on 10/09/2014) 14 capsule 0  . rizatriptan (MAXALT) 10 MG tablet Take 10 mg by mouth as needed. May repeat in 2 hours if needed     No facility-administered medications prior to visit.    PAST MEDICAL HISTORY: Past  Medical History  Diagnosis Date  . Colon polyp   . Headache(784.0)   . Osteoarthritis   . Multiple sclerosis since 1992    Dr. Arlice Colt in [facility]  . Ulcerative proctitis   . Overactive bladder     sees Dr. Nicki Reaper McDiarmid   . Osteoporosis     last DEXA 08-28-07  . Gynecological examination     sees Dr. Elyse Hsu   . Multiple thyroid nodules 1995    per pt. resolved on its own follwed by endo then released  . GERD (gastroesophageal reflux disease)   . Depression   . Bulging lumbar disc 1980  . Pancreatitis 1977  . Neurogenic bladder disorder 12/2009    w/ urge & stress inc. dx. Dr. McDiarmid  . Neuropathy   . Vision abnormalities     PAST SURGICAL HISTORY: Past Surgical History  Procedure Laterality Date  . Appendectomy    . Tonsillectomy    . Widom teeth removal    . Dilation and curettage of uterus    . Left knee surgery  1978    to remove bone tumor    . Cholecystectomy    . Laparoscopy      x 3  . Nasal turbinate reduction    . Breast ruptured  left    ductal cyst  . Vaginal cystocele repair      anterior  . Colonoscopy  01/10/09    repeat in 5 yrs Dr. Wilford Corner  . Pubo vaginal sling  10/2003  . Vaginal hysterectomy  1995    AUB & Endometriosis    FAMILY HISTORY: Family History  Problem Relation Age of Onset  . Osteoporosis Mother     severe  .  Cancer Mother 47    colon /Breast also m. niece at 90's  . Osteopenia Sister     X 2    SOCIAL HISTORY:  History   Social History  . Marital Status: Married    Spouse Name: Jori Moll  . Number of Children: 1  . 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 Smoking status: Former Smoker -- 1.00 packs/day for 5 years    Types: Cigarettes    Quit date: 03/09/1979  . Smokeless tobacco: Never Used  . Alcohol Use: No  . Drug Use: No  . Sexual Activity: Not Currently    Birth Control/ Protection: Surgical   Other Topics Concern  . Not on file   Social History Narrative     PHYSICAL EXAM  Filed Vitals:   10/09/14 0957  BP: 106/52  Pulse: 103  Height: 5\' 6"  (1.676 m)  Weight: 122 lb 12.8 oz (55.702 kg)    Body mass index is 19.83 kg/(m^2).   General: The patient is well-developed and well-nourished and in no acute distress  Neurologic Exam  Mental status: The patient is alert and oriented x 3 at the time of the examination. The patient has apparent normal recent and remote memory, with an apparently normal attention span and concentration ability.   Speech is normal.  Cranial nerves: Extraocular movements are full.   Facial symmetry is present. There is good facial sensation to soft touch bilaterally.Facial strength is normal.  Trapezius and sternocleidomastoid strength is normal. No dysarthria is noted.   No obvious hearing deficits are noted.  Motor:  Muscle bulk is normal and tone is increased in legs. Strength  is  5 / 5 in all 4 extremities.   Sensory: Sensory testing is intact to pinprick, soft touch, vibration sensation, and position sense on all 4 extremities.  Coordination: Cerebellar testing reveals good finger-nose-finger and slightly reduced heel-to-shin bilaterally.  Gait and station: Station is stable and gait is mildly wide and arthritic. Tandem gait is wide. Romberg is negative.    Reflexes: Deep tendon reflexes are symmetric and increased in knees (spread) and ankles (2 beatrs clonus nonsustained).Marland Kitchen     DIAGNOSTIC DATA (LABS, IMAGING, TESTING) - I reviewed patient records, labs, notes, testing and imaging myself where available.      ASSESSMENT AND PLAN  Multiple sclerosis  Ataxic gait  Chronic fatigue  High risk medication use  Depression with anxiety  OVERACTIVE BLADDER  1.  continue Aubagio - check LFT's monthly x 6 months 2.  Switch gabapentin to lamotrigine over the next 6 weeks or so. I gave her a schedule on how to titrate the lamotrigine up and the gabapentin down.   If she felt she slept better with the gabapentin, she can continue to take the nighttime dose. 3.  Renew Adderall and Xanax 4.   Stay active, exercise 5.   rtc 4 months, sooner if worse  Richard A. Felecia Shelling, MD, PhD 09/10/2829, 51:76 AM Certified in Neurology, Clinical Neurophysiology, Sleep Medicine, Pain Medicine and Neuroimaging  [facility] [address], [place], [place] [address] [phone]

## 2014-10-10 ENCOUNTER — Telehealth: Payer: Self-pay

## 2014-10-10 LAB — HEPATIC FUNCTION PANEL
ALBUMIN: 4.1 g/dL (ref 3.6–4.8)
ALT: 21 IU/L (ref 0–32)
AST: 28 IU/L (ref 0–40)
Alkaline Phosphatase: 71 IU/L (ref 39–117)
BILIRUBIN, DIRECT: 0.08 mg/dL (ref 0.00–0.40)
Bilirubin Total: 0.3 mg/dL (ref 0.0–1.2)
TOTAL PROTEIN: 6.7 g/dL (ref 6.0–8.5)

## 2014-10-10 NOTE — Telephone Encounter (Signed)
I spoke to patient and she is aware of results.  

## 2014-10-10 NOTE — Telephone Encounter (Signed)
-----   Message from Britt Bottom, MD sent at 10/10/2014 10:00 AM EDT ----- Please let her know the liver tests looked good.

## 2014-10-21 ENCOUNTER — Telehealth: Payer: Self-pay | Admitting: Neurology

## 2014-10-21 MED ORDER — OXCARBAZEPINE 150 MG PO TABS: 150.0000 mg | ORAL_TABLET | Freq: Two times a day (BID) | ORAL | Status: DC

## 2014-10-21 MED ORDER — TRAMADOL HCL 50 MG PO TABS: 50.0000 mg | ORAL_TABLET | Freq: Four times a day (QID) | ORAL | Status: DC | PRN

## 2014-10-21 NOTE — Telephone Encounter (Signed)
Pt called and states she started lamoTRIgine (LAMICTAL) 25 MG tablet, on Aug 11th one pill twice a day. She is waking up several times a day, nightmares and is unable to take hydrocodone with medication. Please call and advise [phone]

## 2014-10-21 NOTE — Telephone Encounter (Signed)
I have spoken with Sheri Burns this afternoon and per RAS, advised she stay off of Lamictal, and start Oxcarbazepine 150mg  bid.  She verbalized understanding of same.  She requests r/f of Tramadol, which is due.  [facility]ribed [facility], and Tramadol rx. printed, signed, up front [facility] for her to pick up/fim

## 2014-10-21 NOTE — Telephone Encounter (Signed)
I have spoken with Sheri Burns this am.  She was in the process of titrating up on Lamictal for pain--sts. once she got to 25mg  bid, she began having nightmares, difficulty sleeping, "can't think straight."  So, she decreased back to one tab daily, then stopped.  She would like to know what else she can do for bilat neuropathic leg pain (left worse than right).  She is already on a high dose of Gabapentin (2400mg  daily), and is also on Hydrocodone, Tramadol, Cymbalta.  will discuss with RAS and call her back this afternoon/fim

## 2014-10-25 ENCOUNTER — Telehealth: Payer: Self-pay | Admitting: Neurology

## 2014-10-25 NOTE — Telephone Encounter (Signed)
Per previous encounter, RN placed this Rx at the front desk for pick up.  I called back.  Got no answer.  Left message.

## 2014-10-25 NOTE — Telephone Encounter (Signed)
Pt called about medication traMADol (ULTRAM) 50 MG tablet, asked for Rx to be sent to [facility] and they do not have it . She would like [facility] . Thank you

## 2014-10-28 ENCOUNTER — Telehealth: Payer: Self-pay | Admitting: Neurology

## 2014-10-28 DIAGNOSIS — R432 Parageusia: Secondary | ICD-10-CM

## 2014-10-28 DIAGNOSIS — G35 Multiple sclerosis: Secondary | ICD-10-CM

## 2014-10-28 DIAGNOSIS — R430 Anosmia: Secondary | ICD-10-CM

## 2014-10-28 MED ORDER — CLONAZEPAM 1 MG PO TABS: ORAL_TABLET | ORAL | Status: DC

## 2014-10-28 NOTE — Telephone Encounter (Signed)
I have spoken with Sheri Burns this am.  She reports now has total loss of taste.  per RAS, as loss of taste has been gradual, he will get mri brain, then if that doesn't show cause for taste loss, he will refer back to pcp/ent, as often loss of taste/smell is due to shedding of nasal lining.  Dyesha verbalized understanding of same.  MRI brain ordered.  She also requests r/f of Clonazepam, which is due.  Rx. printed, signed and [facility] per Cassidee's request/fim

## 2014-10-28 NOTE — Telephone Encounter (Signed)
Pt called and says her sense of taste is gone. Please call and advise [phone]

## 2014-10-31 ENCOUNTER — Ambulatory Visit
Admission: RE | Admit: 2014-10-31 | Discharge: 2014-10-31 | Disposition: A | Discharge status: Home / Self Care | Payer: Commercial Managed Care - HMO | Source: Ambulatory Visit | Attending: Neurology | Admitting: Neurology

## 2014-10-31 DIAGNOSIS — R432 Parageusia: Secondary | ICD-10-CM

## 2014-10-31 DIAGNOSIS — G35 Multiple sclerosis: Secondary | ICD-10-CM

## 2014-10-31 DIAGNOSIS — R430 Anosmia: Secondary | ICD-10-CM | POA: Diagnosis not present

## 2014-10-31 MED ORDER — GADOBENATE DIMEGLUMINE 529 MG/ML IV SOLN
10.0000 mL | Freq: Once | INTRAVENOUS | Status: AC | PRN
  Administered 2014-10-31: 10 mL via INTRAVENOUS

## 2014-11-01 ENCOUNTER — Telehealth: Payer: Self-pay | Admitting: *Deleted

## 2014-11-01 MED ORDER — AZITHROMYCIN 250 MG PO TABS: ORAL_TABLET | ORAL | Status: DC

## 2014-11-01 NOTE — Telephone Encounter (Signed)
I have spoken with Sheri Burns this afternoon and per RAS, advised that mri brain showed old mild ms changes, also acute and chronic sinusitis which may explain her loss of taste and smell.  She verbalized understanding of same, sts. pcp is already working on a referral to ENT.  I [facility] her [facility]/ifm

## 2014-11-25 ENCOUNTER — Other Ambulatory Visit (INDEPENDENT_AMBULATORY_CARE_PROVIDER_SITE_OTHER): Payer: Self-pay

## 2014-11-25 ENCOUNTER — Other Ambulatory Visit: Payer: Self-pay | Admitting: Neurology

## 2014-11-25 DIAGNOSIS — Z0289 Encounter for other administrative examinations: Secondary | ICD-10-CM

## 2014-11-25 DIAGNOSIS — G35 Multiple sclerosis: Secondary | ICD-10-CM

## 2014-11-26 ENCOUNTER — Telehealth: Payer: Self-pay | Admitting: *Deleted

## 2014-11-26 LAB — HEPATIC FUNCTION PANEL
ALT: 18 IU/L (ref 0–32)
AST: 21 IU/L (ref 0–40)
Albumin: 4.2 g/dL (ref 3.6–4.8)
Alkaline Phosphatase: 80 IU/L (ref 39–117)
Bilirubin Total: <0.2 mg/dL (ref 0.0–1.2)
Bilirubin, Direct: 0.06 mg/dL (ref 0.00–0.40)
TOTAL PROTEIN: 7 g/dL (ref 6.0–8.5)

## 2014-11-26 NOTE — Telephone Encounter (Signed)
I have spoken with Sheri Burns this afternoon and per RAS, advised that lft's look good.  She verbalized understanding of same/fim

## 2014-11-26 NOTE — Telephone Encounter (Signed)
-----   Message from Britt Bottom, MD sent at 11/26/2014  8:26 AM EDT ----- Please let her know the liver labs look good.

## 2014-12-02 ENCOUNTER — Telehealth: Payer: Self-pay | Admitting: Neurology

## 2014-12-02 MED ORDER — AMPHETAMINE-DEXTROAMPHET ER 30 MG PO CP24: 30.0000 mg | ORAL_CAPSULE | Freq: Every day | ORAL | Status: DC

## 2014-12-02 NOTE — Telephone Encounter (Signed)
Rx. up front [facility]/fim 

## 2014-12-02 NOTE — Telephone Encounter (Signed)
Rx. printed, on RAS ledge to be signed/fim

## 2014-12-02 NOTE — Telephone Encounter (Signed)
Patient is calling for a written Rx amphetamine-dextroamphetamine(adderall)30 mg.  Thanks!

## 2014-12-26 ENCOUNTER — Telehealth: Payer: Self-pay | Admitting: Neurology

## 2014-12-26 ENCOUNTER — Telehealth: Payer: Self-pay

## 2014-12-26 MED ORDER — IMIPRAMINE HCL 25 MG PO TABS: ORAL_TABLET | ORAL | Status: DC

## 2014-12-26 NOTE — Telephone Encounter (Signed)
She has decided to stay on Imipramine due to trouble leaking urine at night, until after her sinus surgery in a couple of weeks. Please all and advise, she would also like to [facility] [address]. Thank you

## 2014-12-26 NOTE — Telephone Encounter (Signed)
Last OV says: Due to bladder hesitancy, stop imipramine.  Patient has decided she wants to stay on Imipramine due to trouble leaking urine at night, until after her sinus surgery in a couple of weeks.  Okay to re-add and send Rx?  Thank you.

## 2014-12-26 NOTE — Telephone Encounter (Signed)
Ok to do so

## 2014-12-26 NOTE — Telephone Encounter (Signed)
Per RAS, ok to continue Imipramine.  I have spoken with Afra--she sts. she is taking 50mg  po qhs.  [facility] [address] [facility] her request/fim

## 2014-12-26 NOTE — Telephone Encounter (Signed)
Last OV note says: 1. Due to bladder hesitancy, stop imipramine

## 2015-01-03 ENCOUNTER — Other Ambulatory Visit: Payer: Self-pay | Admitting: Neurology

## 2015-01-03 ENCOUNTER — Telehealth: Payer: Self-pay | Admitting: *Deleted

## 2015-01-03 MED ORDER — AMPHETAMINE-DEXTROAMPHET ER 30 MG PO CP24: 30.0000 mg | ORAL_CAPSULE | Freq: Every day | ORAL | Status: DC

## 2015-01-03 NOTE — Telephone Encounter (Signed)
Rx for generic Adderall XR placed up front for pick up.  Called patient to let know it is ready (reminded her of our office hours).

## 2015-01-03 NOTE — Telephone Encounter (Signed)
Dr Felecia Shelling is out of the office.  Request entered, forwarded to [facility] for approval.

## 2015-01-03 NOTE — Telephone Encounter (Signed)
Pt called requesting refill for amphetamine-dextroamphetamine (ADDERALL XR) 30 MG 24 hr capsule . Pt advised RX will be ready within 24 hours unless otherwise informed by RN

## 2015-01-08 ENCOUNTER — Other Ambulatory Visit (INDEPENDENT_AMBULATORY_CARE_PROVIDER_SITE_OTHER): Payer: Self-pay

## 2015-01-08 ENCOUNTER — Encounter (HOSPITAL_BASED_OUTPATIENT_CLINIC_OR_DEPARTMENT_OTHER): Payer: Self-pay | Admitting: *Deleted

## 2015-01-08 DIAGNOSIS — Z0289 Encounter for other administrative examinations: Secondary | ICD-10-CM

## 2015-01-08 DIAGNOSIS — G35 Multiple sclerosis: Secondary | ICD-10-CM

## 2015-01-09 ENCOUNTER — Other Ambulatory Visit: Payer: Self-pay | Admitting: Neurology

## 2015-01-09 ENCOUNTER — Encounter: Payer: Self-pay | Admitting: *Deleted

## 2015-01-09 ENCOUNTER — Ambulatory Visit: Payer: Self-pay | Admitting: Otolaryngology

## 2015-01-09 LAB — HEPATIC FUNCTION PANEL
ALK PHOS: 75 IU/L (ref 39–117)
ALT: 27 IU/L (ref 0–32)
AST: 25 IU/L (ref 0–40)
Albumin: 4 g/dL (ref 3.6–4.8)
BILIRUBIN, DIRECT: 0.07 mg/dL (ref 0.00–0.40)
Bilirubin Total: <0.2 mg/dL (ref 0.0–1.2)
Total Protein: 6.8 g/dL (ref 6.0–8.5)

## 2015-01-09 MED ORDER — HYDROCODONE-ACETAMINOPHEN 10-325 MG PO TABS: 1.0000 | ORAL_TABLET | Freq: Three times a day (TID) | ORAL | Status: DC | PRN

## 2015-01-09 NOTE — Telephone Encounter (Signed)
Pt needs prior auth on HYDROcodone-acetaminophen (NORCO) 10-325 MG per tablet  . Thank you

## 2015-01-09 NOTE — H&P (Signed)
Assessment  Chronic pansinusitis (473.8) (J32.4). Anosmia (781.1) (R43.0). Discussed  She completed the Avelox with minimal GI side effect started to get worse towards the end. She feels some improvement in the pain and pressure in her face. However, if she bends over the pain and pressure are just as bad as they were before. Percent of smell has not returned. On exam, the nasal cavities are clear. The right middle turbinate and middle meatus are erythematous but there is no visible exudate or polyps.   We discussed different choices at this point, do nothing, additional antibiotics, surgical intervention. I think the best chance of return to normal function with the pain pressure and anosmia is surgical intervention. We specifically discussed that it is unlikely this will help with the ringing in her ears. We discussed that there is a chance that even with successful surgery the olfactory function may be permanently disabled and may not return.   Risks and benefits of the surgery were discussed in detail. All questions were answered. She would like to pursue surgical intervention. Reason For Visit  Sinus. Allergies  Codeine Derivatives; Vomiting Demerol TABS; Vomiting Penicillins Stadol NS SOLN Sulfa Drugs; Hives. [place] Meds  Acetaminophen 500 MG Oral Tablet;; RPT Dicyclomine HCl - 10 MG Oral Capsule;; RPT; Status: TEMPORARY DEFERRAL  Imipramine HCl - 25 MG Oral Tablet;TAKE 1 TO 2 TABLETS BY   MOUTH AT BEDTIME; Rx ClonazePAM 1 MG Oral Tablet;TAKE 1 TABLET BY MOUTH IN AM, 1 TABLET IN EVENING AND 2 TABLETS AT BEDTIME *PA*DENIED*MAX*3/DAY*; Rx * Plan covers #90/30 days., 15 May 2013 Zyrtec 10 MG TABS;; RPT Zyrtec TABS;; RPT Hydrocodone-Acetaminophen 10-325 MG Oral Tablet;TAKE ONE TABLET 3 times daily as needed for pain; Rx * Rx. printed today destroyed./fim, 02 Jan 2013 Cefuroxime Axetil 500 MG Oral Tablet;; RPT; Status: TEMPORARY DEFERRAL  TraMADol HCl - 50 MG Oral Tablet;TAKE 1 TABLET  FOUR TIMES A DAY; Rx * Rx printed on 03/27/14 destroyed-pt never picked up.//acj, 07 Jun 2014 Calcium + D TABS;; RPT Centrum TABS;; RPT Gabapentin 600 MG Oral Tablet;TAKE 1 TABLET 4 TIMES DAILY.; Rx Azithromycin 250 MG Oral Tablet;; RPT; Status: TEMPORARY DEFERRAL  Fluticasone Propionate 50 MCG/ACT Nasal Suspension;; RPT Amitiza 24 MCG Oral Capsule;TAKE ONE CAPSULE BY MOUTH TWICE A DAY; RPT; Status: TEMPORARY DEFERRAL  Amitiza 24 MCG Oral Capsule;TAKE ONE CAPSULE BY MOUTH TWICE A DAY; RPT; Status: TEMPORARY DEFERRAL  Amitiza 24 MCG Oral Capsule;TAKE ONE CAPSULE BY MOUTH TWICE A DAY; RPT; Status: TEMPORARY DEFERRAL  Amitiza 24 MCG Oral Capsule;TAKE ONE CAPSULE BY MOUTH TWICE A DAY; RPT; Status: TEMPORARY DEFERRAL  Oxybutynin Chloride ER 10 MG Oral Tablet Extended Release 24 Hour;; RPT OXcarbazepine 150 MG Oral Tablet;; RPT LamoTRIgine 25 MG Oral Tablet;; RPT; Status: TEMPORARY DEFERRAL  Amphetamine-Dextroamphet ER 30 MG Oral Capsule Extended Release 24 Hour (Adderall XR);TAKE ONE CAPSULE BY MOUTH ONCE DAILY.; Rx * Rx. printed today mailed per pt's request/fim, 13 Nov 2013 Vitamin D (Ergocalciferol) 50000 UNIT Oral Capsule;Take one capsule every other week.; RPT Aubagio 14 MG Oral Tablet;; RPT DULoxetine HCl - 60 MG Oral Capsule Delayed Release Particles;TAKE 1 TABLET DAILY.; Rx * 30ds RF granted / pt following RAS  /// esl, 14 May 2014. 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 Family Hx  No pertinent family history: Mother. Personal Hx  Former smoker [phone]). Signature  Electronically signed by : Izora Gala  M.D.; 12/23/2014 5:01 PM EST.

## 2015-01-09 NOTE — Telephone Encounter (Signed)
I contacted ins.  Spoke with Clair Gulling.  He said they do not show a prior auth is needed, appears patient is due for a refill auth.  Request entered, forwarded to provider for approval.

## 2015-01-09 NOTE — Telephone Encounter (Signed)
LMOM (identifed vm) that per RAS, labs are fine.  She does not need to return this call unless she has questions/fim

## 2015-01-09 NOTE — Telephone Encounter (Signed)
-----   Message from Britt Bottom, MD sent at 01/09/2015  8:40 AM EDT ----- Please let her know that the labs are fine.

## 2015-01-09 NOTE — Progress Notes (Signed)
Hydrocodone rx. up front [facility]/fim 

## 2015-01-13 ENCOUNTER — Ambulatory Visit (HOSPITAL_BASED_OUTPATIENT_CLINIC_OR_DEPARTMENT_OTHER): Payer: Commercial Managed Care - HMO | Admitting: Anesthesiology

## 2015-01-13 ENCOUNTER — Encounter (HOSPITAL_BASED_OUTPATIENT_CLINIC_OR_DEPARTMENT_OTHER): Payer: Self-pay | Admitting: *Deleted

## 2015-01-13 ENCOUNTER — Ambulatory Visit (HOSPITAL_BASED_OUTPATIENT_CLINIC_OR_DEPARTMENT_OTHER)
Admission: RE | Admit: 2015-01-13 | Discharge: 2015-01-13 | Disposition: A | Discharge status: Home / Self Care | Payer: Commercial Managed Care - HMO | Source: Ambulatory Visit | Attending: Otolaryngology | Admitting: Otolaryngology

## 2015-01-13 ENCOUNTER — Encounter (HOSPITAL_BASED_OUTPATIENT_CLINIC_OR_DEPARTMENT_OTHER)
Admission: RE | Disposition: A | Discharge status: Home / Self Care | Payer: Self-pay | Source: Ambulatory Visit | Attending: Otolaryngology

## 2015-01-13 DIAGNOSIS — K219 Gastro-esophageal reflux disease without esophagitis: Secondary | ICD-10-CM | POA: Insufficient documentation

## 2015-01-13 DIAGNOSIS — Z880 Allergy status to penicillin: Secondary | ICD-10-CM | POA: Insufficient documentation

## 2015-01-13 DIAGNOSIS — R430 Anosmia: Secondary | ICD-10-CM | POA: Diagnosis not present

## 2015-01-13 DIAGNOSIS — R270 Ataxia, unspecified: Secondary | ICD-10-CM | POA: Diagnosis not present

## 2015-01-13 DIAGNOSIS — M1990 Unspecified osteoarthritis, unspecified site: Secondary | ICD-10-CM | POA: Diagnosis not present

## 2015-01-13 DIAGNOSIS — J342 Deviated nasal septum: Secondary | ICD-10-CM | POA: Insufficient documentation

## 2015-01-13 DIAGNOSIS — Z888 Allergy status to other drugs, medicaments and biological substances status: Secondary | ICD-10-CM | POA: Insufficient documentation

## 2015-01-13 DIAGNOSIS — G4700 Insomnia, unspecified: Secondary | ICD-10-CM | POA: Diagnosis not present

## 2015-01-13 DIAGNOSIS — Z885 Allergy status to narcotic agent status: Secondary | ICD-10-CM | POA: Insufficient documentation

## 2015-01-13 DIAGNOSIS — J324 Chronic pansinusitis: Secondary | ICD-10-CM | POA: Insufficient documentation

## 2015-01-13 DIAGNOSIS — G35 Multiple sclerosis: Secondary | ICD-10-CM | POA: Insufficient documentation

## 2015-01-13 DIAGNOSIS — G5722 Lesion of femoral nerve, left lower limb: Secondary | ICD-10-CM | POA: Insufficient documentation

## 2015-01-13 DIAGNOSIS — F329 Major depressive disorder, single episode, unspecified: Secondary | ICD-10-CM | POA: Diagnosis not present

## 2015-01-13 DIAGNOSIS — G43909 Migraine, unspecified, not intractable, without status migrainosus: Secondary | ICD-10-CM | POA: Insufficient documentation

## 2015-01-13 DIAGNOSIS — R3911 Hesitancy of micturition: Secondary | ICD-10-CM | POA: Diagnosis not present

## 2015-01-13 DIAGNOSIS — F419 Anxiety disorder, unspecified: Secondary | ICD-10-CM | POA: Diagnosis not present

## 2015-01-13 DIAGNOSIS — G2581 Restless legs syndrome: Secondary | ICD-10-CM | POA: Insufficient documentation

## 2015-01-13 DIAGNOSIS — Z87891 Personal history of nicotine dependence: Secondary | ICD-10-CM | POA: Diagnosis not present

## 2015-01-13 DIAGNOSIS — Z882 Allergy status to sulfonamides status: Secondary | ICD-10-CM | POA: Insufficient documentation

## 2015-01-13 HISTORY — DX: Chronic pansinusitis: J32.4

## 2015-01-13 HISTORY — PX: NASAL SINUS SURGERY: SHX719

## 2015-01-13 HISTORY — DX: Anxiety disorder, unspecified: F41.9

## 2015-01-13 SURGERY — SINUS SURGERY, ENDOSCOPIC
Anesthesia: General | Site: Nose | Laterality: Bilateral

## 2015-01-13 MED ORDER — LIDOCAINE-EPINEPHRINE 1 %-1:100000 IJ SOLN
INTRAMUSCULAR | Status: DC | PRN
  Administered 2015-01-13: 6 mL

## 2015-01-13 MED ORDER — GLYCOPYRROLATE 0.2 MG/ML IJ SOLN
INTRAMUSCULAR | Status: AC
  Filled 2015-01-13: qty 1

## 2015-01-13 MED ORDER — SCOPOLAMINE 1 MG/3DAYS TD PT72
MEDICATED_PATCH | TRANSDERMAL | Status: AC
  Filled 2015-01-13: qty 1

## 2015-01-13 MED ORDER — FENTANYL CITRATE (PF) 100 MCG/2ML IJ SOLN
INTRAMUSCULAR | Status: AC
  Filled 2015-01-13: qty 2

## 2015-01-13 MED ORDER — DEXAMETHASONE SODIUM PHOSPHATE 10 MG/ML IJ SOLN
INTRAMUSCULAR | Status: AC
  Filled 2015-01-13: qty 1

## 2015-01-13 MED ORDER — LIDOCAINE HCL (CARDIAC) 20 MG/ML IV SOLN
INTRAVENOUS | Status: AC
  Filled 2015-01-13: qty 5

## 2015-01-13 MED ORDER — SCOPOLAMINE 1 MG/3DAYS TD PT72
MEDICATED_PATCH | TRANSDERMAL | Status: DC | PRN
  Administered 2015-01-13: 1 via TRANSDERMAL

## 2015-01-13 MED ORDER — FENTANYL CITRATE (PF) 100 MCG/2ML IJ SOLN
INTRAMUSCULAR | Status: AC
  Filled 2015-01-13: qty 4

## 2015-01-13 MED ORDER — MIDAZOLAM HCL 2 MG/2ML IJ SOLN
INTRAMUSCULAR | Status: AC
  Filled 2015-01-13: qty 4

## 2015-01-13 MED ORDER — BACIT-POLY-NEO HC 1 % EX OINT
TOPICAL_OINTMENT | CUTANEOUS | Status: AC
  Filled 2015-01-13: qty 15

## 2015-01-13 MED ORDER — GLYCOPYRROLATE 0.2 MG/ML IJ SOLN
INTRAMUSCULAR | Status: DC | PRN
  Administered 2015-01-13: 0.6 mg via INTRAVENOUS

## 2015-01-13 MED ORDER — OXYMETAZOLINE HCL 0.05 % NA SOLN
NASAL | Status: AC
  Filled 2015-01-13: qty 15

## 2015-01-13 MED ORDER — PROPOFOL 10 MG/ML IV BOLUS
INTRAVENOUS | Status: DC | PRN
  Administered 2015-01-13: 40 mg via INTRAVENOUS
  Administered 2015-01-13: 140 mg via INTRAVENOUS

## 2015-01-13 MED ORDER — FENTANYL CITRATE (PF) 100 MCG/2ML IJ SOLN
INTRAMUSCULAR | Status: DC | PRN
  Administered 2015-01-13 (×3): 50 ug via INTRAVENOUS

## 2015-01-13 MED ORDER — BACIT-POLY-NEO HC 1 % EX OINT
TOPICAL_OINTMENT | CUTANEOUS | Status: DC | PRN
  Administered 2015-01-13: 1

## 2015-01-13 MED ORDER — LIDOCAINE HCL (CARDIAC) 20 MG/ML IV SOLN
INTRAVENOUS | Status: DC | PRN
  Administered 2015-01-13: 60 mg via INTRAVENOUS

## 2015-01-13 MED ORDER — NALOXONE HCL 0.4 MG/ML IJ SOLN
INTRAMUSCULAR | Status: DC | PRN
  Administered 2015-01-13: 0.2 mg via INTRAVENOUS

## 2015-01-13 MED ORDER — ONDANSETRON HCL 4 MG/2ML IJ SOLN: 4.0000 mg | Freq: Once | INTRAMUSCULAR | Status: DC | PRN

## 2015-01-13 MED ORDER — NEOMYCIN-POLYMYXIN-GRAMICIDIN 1.75-10000-.025 OP SOLN
OPHTHALMIC | Status: AC
  Filled 2015-01-13: qty 10

## 2015-01-13 MED ORDER — FENTANYL CITRATE (PF) 100 MCG/2ML IJ SOLN
25.0000 ug | INTRAMUSCULAR | Status: DC | PRN
  Administered 2015-01-13 (×2): 50 ug via INTRAVENOUS

## 2015-01-13 MED ORDER — CLINDAMYCIN PHOSPHATE 900 MG/50ML IV SOLN
INTRAVENOUS | Status: AC
  Filled 2015-01-13: qty 50

## 2015-01-13 MED ORDER — HYDROCODONE-ACETAMINOPHEN 7.5-325 MG PO TABS: 1.0000 | ORAL_TABLET | Freq: Four times a day (QID) | ORAL | Status: DC | PRN

## 2015-01-13 MED ORDER — OXYMETAZOLINE HCL 0.05 % NA SOLN
2.0000 | NASAL | Status: DC
  Administered 2015-01-13: 2 via NASAL

## 2015-01-13 MED ORDER — PROPOFOL 10 MG/ML IV BOLUS
INTRAVENOUS | Status: AC
  Filled 2015-01-13: qty 20

## 2015-01-13 MED ORDER — ROCURONIUM BROMIDE 50 MG/5ML IV SOLN
INTRAVENOUS | Status: AC
  Filled 2015-01-13: qty 1

## 2015-01-13 MED ORDER — OXYMETAZOLINE HCL 0.05 % NA SOLN
NASAL | Status: DC | PRN
  Administered 2015-01-13: 1

## 2015-01-13 MED ORDER — ONDANSETRON HCL 4 MG/2ML IJ SOLN
INTRAMUSCULAR | Status: DC | PRN
  Administered 2015-01-13: 4 mg via INTRAVENOUS

## 2015-01-13 MED ORDER — CLINDAMYCIN PHOSPHATE 900 MG/50ML IV SOLN
900.0000 mg | INTRAVENOUS | Status: AC
  Administered 2015-01-13: 900 mg via INTRAVENOUS

## 2015-01-13 MED ORDER — NEOSTIGMINE METHYLSULFATE 10 MG/10ML IV SOLN
INTRAVENOUS | Status: AC
  Filled 2015-01-13: qty 1

## 2015-01-13 MED ORDER — CLINDAMYCIN HCL 300 MG PO CAPS: 300.0000 mg | ORAL_CAPSULE | Freq: Three times a day (TID) | ORAL | Status: DC

## 2015-01-13 MED ORDER — ONDANSETRON HCL 4 MG/2ML IJ SOLN
INTRAMUSCULAR | Status: AC
  Filled 2015-01-13: qty 2

## 2015-01-13 MED ORDER — PROMETHAZINE HCL 25 MG RE SUPP: 25.0000 mg | Freq: Four times a day (QID) | RECTAL | Status: DC | PRN

## 2015-01-13 MED ORDER — NEOSTIGMINE METHYLSULFATE 10 MG/10ML IV SOLN
INTRAVENOUS | Status: DC | PRN
  Administered 2015-01-13: 4 mg via INTRAVENOUS

## 2015-01-13 MED ORDER — LACTATED RINGERS IV SOLN
INTRAVENOUS | Status: DC
  Administered 2015-01-13: 08:00:00 via INTRAVENOUS
  Administered 2015-01-13: 10 mL/h via INTRAVENOUS

## 2015-01-13 MED ORDER — LIDOCAINE-EPINEPHRINE 1 %-1:100000 IJ SOLN
INTRAMUSCULAR | Status: AC
  Filled 2015-01-13: qty 1

## 2015-01-13 MED ORDER — ROCURONIUM BROMIDE 100 MG/10ML IV SOLN
INTRAVENOUS | Status: DC | PRN
  Administered 2015-01-13: 40 mg via INTRAVENOUS

## 2015-01-13 MED ORDER — DEXAMETHASONE SODIUM PHOSPHATE 4 MG/ML IJ SOLN
INTRAMUSCULAR | Status: DC | PRN
Start: 2015-01-13 — End: 2015-01-13
  Administered 2015-01-13: 10 mg via INTRAVENOUS

## 2015-01-13 SURGICAL SUPPLY — 29 items
ATTRACTOMAT 16X20 MAGNETIC DRP (DRAPES) ×2 IMPLANT
BLADE TRICUT ROTATE M4 4 5PK (BLADE) ×2 IMPLANT
CANISTER SUC SOCK COL 7IN (MISCELLANEOUS) ×2 IMPLANT
CANISTER SUCT 1200ML W/VALVE (MISCELLANEOUS) ×4 IMPLANT
DRESSING ADAPTIC 1/2  N-ADH (PACKING) IMPLANT
DRESSING NASAL KENNEDY 3.5X.9 (MISCELLANEOUS) IMPLANT
DRSG NASAL KENNEDY 3.5X.9 (MISCELLANEOUS)
DRSG NASAL KENNEDY LMNT 8CM (GAUZE/BANDAGES/DRESSINGS) IMPLANT
DRSG NASOPORE 8CM (GAUZE/BANDAGES/DRESSINGS) ×2 IMPLANT
DRSG TELFA 3X8 NADH (GAUZE/BANDAGES/DRESSINGS) ×2 IMPLANT
GLOVE BIOGEL PI IND STRL 7.0 (GLOVE) ×1 IMPLANT
GLOVE BIOGEL PI INDICATOR 7.0 (GLOVE) ×1
GLOVE ECLIPSE 6.5 STRL STRAW (GLOVE) ×2 IMPLANT
GLOVE ECLIPSE 7.5 STRL STRAW (GLOVE) ×2 IMPLANT
GOWN STRL REUS W/ TWL LRG LVL3 (GOWN DISPOSABLE) ×2 IMPLANT
GOWN STRL REUS W/TWL LRG LVL3 (GOWN DISPOSABLE) ×2
IV NS 500ML (IV SOLUTION) ×1
IV NS 500ML BAXH (IV SOLUTION) ×1 IMPLANT
NEEDLE PRECISIONGLIDE 27X1.5 (NEEDLE) ×2 IMPLANT
NS IRRIG 1000ML POUR BTL (IV SOLUTION) ×2 IMPLANT
PACK BASIN DAY SURGERY FS (CUSTOM PROCEDURE TRAY) ×2 IMPLANT
PACK ENT DAY SURGERY (CUSTOM PROCEDURE TRAY) ×2 IMPLANT
PATTIES SURGICAL .5 X3 (DISPOSABLE) ×2 IMPLANT
SLEEVE SCD COMPRESS KNEE MED (MISCELLANEOUS) ×2 IMPLANT
SOLUTION BUTLER CLEAR DIP (MISCELLANEOUS) ×2 IMPLANT
SPONGE GAUZE 2X2 8PLY STRL LF (GAUZE/BANDAGES/DRESSINGS) ×2 IMPLANT
TOWEL OR 17X24 6PK STRL BLUE (TOWEL DISPOSABLE) ×4 IMPLANT
TUBE CONNECTING 20X1/4 (TUBING) ×2 IMPLANT
YANKAUER SUCT BULB TIP NO VENT (SUCTIONS) ×2 IMPLANT

## 2015-01-13 NOTE — H&P (View-Only) (Signed)
Assessment  Chronic pansinusitis (473.8) (J32.4). Anosmia (781.1) (R43.0). Discussed  She completed the Avelox with minimal GI side effect started to get worse towards the end. She feels some improvement in the pain and pressure in her face. However, if she bends over the pain and pressure are just as bad as they were before. Percent of smell has not returned. On exam, the nasal cavities are clear. The right middle turbinate and middle meatus are erythematous but there is no visible exudate or polyps.   We discussed different choices at this point, do nothing, additional antibiotics, surgical intervention. I think the best chance of return to normal function with the pain pressure and anosmia is surgical intervention. We specifically discussed that it is unlikely this will help with the ringing in her ears. We discussed that there is a chance that even with successful surgery the olfactory function may be permanently disabled and may not return.   Risks and benefits of the surgery were discussed in detail. All questions were answered. She would like to pursue surgical intervention. Reason For Visit  Sinus. Allergies  Codeine Derivatives; Vomiting Demerol TABS; Vomiting Penicillins Stadol NS SOLN Sulfa Drugs; Hives. Fernholz Meds  Acetaminophen 500 MG Oral Tablet;; RPT Dicyclomine HCl - 10 MG Oral Capsule;; RPT; Status: TEMPORARY DEFERRAL  Imipramine HCl - 25 MG Oral Tablet;TAKE 1 TO 2 TABLETS BY   MOUTH AT BEDTIME; Rx ClonazePAM 1 MG Oral Tablet;TAKE 1 TABLET BY MOUTH IN AM, 1 TABLET IN EVENING AND 2 TABLETS AT BEDTIME *PA*DENIED*MAX*3/DAY*; Rx * Plan covers #90/30 days., 15 May 2013 Zyrtec 10 MG TABS;; RPT Zyrtec TABS;; RPT Hydrocodone-Acetaminophen 10-325 MG Oral Tablet;TAKE ONE TABLET 3 times daily as needed for pain; Rx * Rx. printed today destroyed./fim, 02 Jan 2013 Cefuroxime Axetil 500 MG Oral Tablet;; RPT; Status: TEMPORARY DEFERRAL  TraMADol HCl - 50 MG Oral Tablet;TAKE 1 TABLET  FOUR TIMES A DAY; Rx * Rx printed on 03/27/14 destroyed-pt never picked up.//acj, 07 Jun 2014 Calcium + D TABS;; RPT Centrum TABS;; RPT Gabapentin 600 MG Oral Tablet;TAKE 1 TABLET 4 TIMES DAILY.; Rx Azithromycin 250 MG Oral Tablet;; RPT; Status: TEMPORARY DEFERRAL  Fluticasone Propionate 50 MCG/ACT Nasal Suspension;; RPT Amitiza 24 MCG Oral Capsule;TAKE ONE CAPSULE BY MOUTH TWICE A DAY; RPT; Status: TEMPORARY DEFERRAL  Amitiza 24 MCG Oral Capsule;TAKE ONE CAPSULE BY MOUTH TWICE A DAY; RPT; Status: TEMPORARY DEFERRAL  Amitiza 24 MCG Oral Capsule;TAKE ONE CAPSULE BY MOUTH TWICE A DAY; RPT; Status: TEMPORARY DEFERRAL  Amitiza 24 MCG Oral Capsule;TAKE ONE CAPSULE BY MOUTH TWICE A DAY; RPT; Status: TEMPORARY DEFERRAL  Oxybutynin Chloride ER 10 MG Oral Tablet Extended Release 24 Hour;; RPT OXcarbazepine 150 MG Oral Tablet;; RPT LamoTRIgine 25 MG Oral Tablet;; RPT; Status: TEMPORARY DEFERRAL  Amphetamine-Dextroamphet ER 30 MG Oral Capsule Extended Release 24 Hour (Adderall XR);TAKE ONE CAPSULE BY MOUTH ONCE DAILY.; Rx * Rx. printed today mailed per pt's request/fim, 13 Nov 2013 Vitamin D (Ergocalciferol) 50000 UNIT Oral Capsule;Take one capsule every other week.; RPT Aubagio 14 MG Oral Tablet;; RPT DULoxetine HCl - 60 MG Oral Capsule Delayed Release Particles;TAKE 1 TABLET DAILY.; Rx * 30ds RF granted / pt following RAS  /// esl, 14 May 2014. 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 Family Hx  No pertinent family history: Mother. Personal Hx  Former smoker [phone]) [phone]). Signature  Electronically signed by : Izora Gala  M.D.; 12/23/2014 5:01 PM EST.

## 2015-01-13 NOTE — Anesthesia Postprocedure Evaluation (Signed)
  Anesthesia Post-op Note  Patient: Sheri Burns  Procedure(s) Performed: Procedure(s) (LRB): ENDOSCOPIC SINUS SURGERY (Bilateral)  Patient Location: PACU  Anesthesia Type: General  Level of Consciousness: awake and alert   Airway and Oxygen Therapy: Patient Spontanous Breathing  Post-op Pain: mild  Post-op Assessment: Post-op Vital signs reviewed, Patient's Cardiovascular Status Stable, Respiratory Function Stable, Patent Airway and No signs of Nausea or vomiting  Last Vitals:  Filed Vitals:   01/13/15 0900  BP: 170/91  Pulse: 89  Temp:   Resp: 16    Post-op Vital Signs: stable   Complications: No apparent anesthesia complications

## 2015-01-13 NOTE — Anesthesia Preprocedure Evaluation (Addendum)
Anesthesia Evaluation  Patient identified by MRN, date of birth, ID band Patient awake    Reviewed: Allergy & Precautions, NPO status , Patient's Chart, lab work & pertinent test results  History of Anesthesia Complications Negative for: history of anesthetic complications  Airway Mallampati: II  TM Distance: >3 FB Neck ROM: Full    Dental no notable dental hx. (+) Dental Advisory Given Crowns :   Pulmonary former smoker,    Pulmonary exam normal breath sounds clear to auscultation       Cardiovascular negative cardio ROS Normal cardiovascular exam Rhythm:Regular Rate:Normal     Neuro/Psych  Headaches, PSYCHIATRIC DISORDERS Anxiety Depression Hx of MS, symptoms stable, left leg has pain/dysesthesia and both feet have burning dysesthesia, gait abnormalities, neurogenic bladder, last neurology note reviewed  Neuromuscular disease    GI/Hepatic Neg liver ROS, GERD  Medicated and Controlled,  Endo/Other  negative endocrine ROS  Renal/GU negative Renal ROS  negative genitourinary   Musculoskeletal  (+) Arthritis ,   Abdominal   Peds negative pediatric ROS (+)  Hematology negative hematology ROS (+)   Anesthesia Other Findings   Reproductive/Obstetrics negative OB ROS                           Anesthesia Physical Anesthesia Plan  ASA: III  Anesthesia Plan: General   Post-op Pain Management:    Induction: Intravenous  Airway Management Planned: Oral ETT  Additional Equipment:   Intra-op Plan:   Post-operative Plan: Extubation in OR  Informed Consent: I have reviewed the patients History and Physical, chart, labs and discussed the procedure including the risks, benefits and alternatives for the proposed anesthesia with the patient or authorized representative who has indicated his/her understanding and acceptance.   Dental advisory given  Plan Discussed with: CRNA  Anesthesia  Plan Comments: (Reports that she has had morphine, dilaudid, and fentanyl with no problems in the past. )       Anesthesia Quick Evaluation

## 2015-01-13 NOTE — Op Note (Signed)
OPERATIVE REPORT  DATE OF SURGERY: 01/13/2015  PATIENT:  Sheri Burns,  58 y.o. female  PRE-OPERATIVE DIAGNOSIS:  CHRONIC PANSINUSITIS,ANOSMIA  POST-OPERATIVE DIAGNOSIS:  CHRONIC PANSINUSITIS,ANOSMIA  PROCEDURE:  Procedure(s): ENDOSCOPIC SINUS SURGERY-bilateral endoscopic total ethmoidectomy, bilateral endoscopic maxillary antrostomy with removal of tissue from maxillary antra  SURGEON:  Beckie Salts, MD  ASSISTANTS: None  ANESTHESIA:   General   EBL:  50 ml  DRAINS: None  LOCAL MEDICATIONS USED:  1% Xylocaine with epinephrine  SPECIMEN:  Bilateral nasal and sinus contents  COUNTS:  Correct  PROCEDURE DETAILS: The patient was taken to the operating room and placed on the operating table in the supine position. Following induction of general endotracheal anesthesia, the patient was draped in a standard fashion. Afrin spray was used preoperatively in the nasal cavities. Local anesthetic was infiltrated into the superior and posterior attachments of the middle turbinates bilaterally. The lateral nasal wall was infiltrated as well. There was a deviated septum towards the left but I was able to work around this and treatment of that was not necessary.  1. Bilateral endoscopic total ethmoidectomy. Using the 0 and 30 nasal endoscopes and a combination of endoscopic forceps and microdebrider a complete ethmoid dissection was accomplished bilaterally. The right side was dissected initially followed by the left. The procedure was the same on both sides. The bulla was taken down. The uncinate was removed with backbiting forceps. The ethmoid dissection was accomplished. There is diffuse polypoid tissue present throughout the entire ethmoid complex bilaterally. The lamina papyracea which kept intact bilaterally except for a small dehiscence on the right side with exposed orbital fat but no bleeding or herniation. The fovea was kept intact superiorly. The frontal recess was cleaned of all  polypoid tissue as well and thick mucoid purulent secretions were aspirated from bilateral frontal sinuses.  2. Bilateral endoscopic maxillary antrostomy with removal of tissue. After the initial part of the ethmoid dissection was accomplished a 30 scope and curved suction were used to enter into the natural ostium. This was opened anteriorly with backbiting forceps and posteriorly with Tru-Cut forceps. Both sides were filled with mucopus and polypoid mucosal tissue. This was completely cleaned out on both sides.  After the dissection was completed, nasal pore packing was placed bilaterally. The patient was then awakened extubated and transferred to recovery in stable condition.    PATIENT DISPOSITION:  To PACU, stable

## 2015-01-13 NOTE — Discharge Instructions (Signed)
Avoid nose blowing for 2 weeks. Use saline nasal spray every hour while awake. You may also use saline irrigations such as a Neti pot a couple of times each day if you desire.    Post Anesthesia Home Care Instructions  Activity: Get plenty of rest for the remainder of the day. A responsible adult should stay with you for 24 hours following the procedure.  For the next 24 hours, DO NOT: -Drive a car -Paediatric nurse -Drink alcoholic beverages -Take any medication unless instructed by your physician -Make any legal decisions or sign important papers.  Meals: Start with liquid foods such as gelatin or soup. Progress to regular foods as tolerated. Avoid greasy, spicy, heavy foods. If nausea and/or vomiting occur, drink only clear liquids until the nausea and/or vomiting subsides. Call your physician if vomiting continues.  Special Instructions/Symptoms: Your throat may feel dry or sore from the anesthesia or the breathing tube placed in your throat during surgery. If this causes discomfort, gargle with warm salt water. The discomfort should disappear within 24 hours.  If you had a scopolamine patch placed behind your ear for the management of post- operative nausea and/or vomiting:  1. The medication in the patch is effective for 72 hours, after which it should be removed.  Wrap patch in a tissue and discard in the trash. Wash hands thoroughly with soap and water. 2. You may remove the patch earlier than 72 hours if you experience unpleasant side effects which may include dry mouth, dizziness or visual disturbances. 3. Avoid touching the patch. Wash your hands with soap and water after contact with the patch.

## 2015-01-13 NOTE — Interval H&P Note (Signed)
History and Physical Interval Note:  01/13/2015 7:13 AM  Sheri Burns  has presented today for surgery, with the diagnosis of CHRONIC PANSINUSITIS,ANOSMIA  The various methods of treatment have been discussed with the patient and family. After consideration of risks, benefits and other options for treatment, the patient has consented to  Procedure(s): ENDOSCOPIC SINUS SURGERY (Bilateral) as a surgical intervention .  The patient's history has been reviewed, patient examined, no change in status, stable for surgery.  I have reviewed the patient's chart and labs.  Questions were answered to the patient's satisfaction.     Joaovictor Krone

## 2015-01-13 NOTE — Transfer of Care (Signed)
Immediate Anesthesia Transfer of Care Note  Patient: Sheri Burns  Procedure(s) Performed: Procedure(s): ENDOSCOPIC SINUS SURGERY (Bilateral)  Patient Location: PACU  Anesthesia Type:General  Level of Consciousness: sedated  Airway & Oxygen Therapy: Patient Spontanous Breathing and Patient connected to face mask oxygen  Post-op Assessment: Report given to RN and Post -op Vital signs reviewed and stable  Post vital signs: Reviewed and stable  Last Vitals:  Filed Vitals:   01/13/15 0643  BP: 145/59  Pulse: 93  Temp: 36.7 C  Resp: 18    Complications: No apparent anesthesia complications

## 2015-01-13 NOTE — Anesthesia Procedure Notes (Signed)
Procedure Name: Intubation Date/Time: 01/13/2015 7:36 AM Performed by: Marrianne Mood Pre-anesthesia Checklist: Patient identified, Emergency Drugs available, Suction available, Patient being monitored and Timeout performed Patient Re-evaluated:Patient Re-evaluated prior to inductionOxygen Delivery Method: Circle System Utilized Preoxygenation: Pre-oxygenation with 100% oxygen Intubation Type: IV induction Ventilation: Mask ventilation without difficulty Laryngoscope Size: Miller and 3 Grade View: Grade III Tube type: Oral Tube size: 7.0 mm Number of attempts: 1 Airway Equipment and Method: Stylet and Oral airway Placement Confirmation: ETT inserted through vocal cords under direct vision,  positive ETCO2 and breath sounds checked- equal and bilateral Secured at: 21 cm Tube secured with: Tape Dental Injury: Teeth and Oropharynx as per pre-operative assessment

## 2015-01-14 ENCOUNTER — Encounter (HOSPITAL_BASED_OUTPATIENT_CLINIC_OR_DEPARTMENT_OTHER): Payer: Self-pay | Admitting: Otolaryngology

## 2015-02-07 ENCOUNTER — Ambulatory Visit: Payer: Commercial Managed Care - HMO | Admitting: Neurology

## 2015-02-10 ENCOUNTER — Other Ambulatory Visit: Payer: Self-pay | Admitting: Neurology

## 2015-02-10 ENCOUNTER — Encounter: Payer: Self-pay | Admitting: *Deleted

## 2015-02-10 ENCOUNTER — Ambulatory Visit: Payer: Commercial Managed Care - HMO | Admitting: Neurology

## 2015-02-10 MED ORDER — AMPHETAMINE-DEXTROAMPHET ER 30 MG PO CP24: 30.0000 mg | ORAL_CAPSULE | Freq: Every day | ORAL | Status: DC

## 2015-02-10 NOTE — Progress Notes (Signed)
Adderall rx. up front [facility]im 

## 2015-02-10 NOTE — Telephone Encounter (Signed)
Patient is calling to orders a written Rx amphetamine-dextroamphetamine 30 mg 24 hr capsule.  Thanks!

## 2015-02-10 NOTE — Telephone Encounter (Signed)
Request entered, forwarded to provider for approval.  

## 2015-02-12 ENCOUNTER — Encounter: Payer: Self-pay | Admitting: Neurology

## 2015-02-12 ENCOUNTER — Ambulatory Visit (INDEPENDENT_AMBULATORY_CARE_PROVIDER_SITE_OTHER): Payer: Commercial Managed Care - HMO | Admitting: Neurology

## 2015-02-12 VITALS — BP 115/66 | HR 94 | Ht 66.0 in | Wt 124.6 lb

## 2015-02-12 DIAGNOSIS — R260 Ataxic gait: Secondary | ICD-10-CM

## 2015-02-12 DIAGNOSIS — F418 Other specified anxiety disorders: Secondary | ICD-10-CM | POA: Diagnosis not present

## 2015-02-12 DIAGNOSIS — G35 Multiple sclerosis: Secondary | ICD-10-CM

## 2015-02-12 DIAGNOSIS — Z79899 Other long term (current) drug therapy: Secondary | ICD-10-CM

## 2015-02-12 DIAGNOSIS — R5382 Chronic fatigue, unspecified: Secondary | ICD-10-CM

## 2015-02-12 MED ORDER — AMPHETAMINE-DEXTROAMPHETAMINE 20 MG PO TABS: 20.0000 mg | ORAL_TABLET | Freq: Every day | ORAL | Status: DC

## 2015-02-12 NOTE — Progress Notes (Signed)
[facility]  PATIENT: Sheri Burns DOB: 1956-01-15  REFERRING CLINICIAN: Thressa Sheller  HISTORY FROM: Patient REASON FOR VISIT: MS   HISTORICAL  CHIEF COMPLAINT:  Chief Complaint  Patient presents with  . Follow-up    Rm 5 by herself. F/u MS. Aubagio going well. No complaints at this time. Needs labs.    HISTORY OF PRESENT ILLNESS:  Sheri Burns is a 59 year old woman with MS.   She is on Aubagio and tolerates it well.   No MS exacerbations noted.    Fatigue remains a major problem.   Leg pain also bothers her, left > right.   She just had endoscopic sinus surgery and has less eye pain and sinus pain.       Gait/strength/sensation:    She feels gait is stable.   She stumbles some but no recent fall.    She reports that her left leg has pain/dysesthesia and both feet have burning dysesthesia.  She takes gabapentin 600 mg po qid and is not sure she gets much benefit.  Leg pain increases with standing and worsens as the day goes on.  She has mild weakness and stiffness in her legs    Dysphagia:   She has had dysphagia x 1 year that is unchanged since the last visit.   Endoscopy has been performed.   Her mouth is dry.    She notes that it takes her longer to chew and swallow.  She has the most trouble swallowing foods like bread.    Food sometimes gets stuck and she occasionally coughs after eating.     Bladder:   She has bladder dysfunction, helped by oxybutynin .    No recent incontinence   On oxybutynin, frequency and incontinence improved but hesitancy worsened.     Fatigue/Sleep:   She is having more fatigue.  She wakes up refreshed but feels more tired as the day goes on.   And she is often feels drained by mid afternoon.    Adderall helps some.    Hisband was up a lot at noght and that made sleep worse.    She changed to her on bedroom and now sleeps better (up to 9 hours).       Cognitive:   She feels she is worse with cognition and is noting  difficulty coming up with the right words and STM.    Focus and attention are poor.  Sometimes she processes very slowly (verbal and reading)  Cognition, fatigue and attention are all better with Adderall.  Mood: Mood is doing worse than last year, despite Cymbalta.  She reports more stress with husband's issues (she feels he constantly talks about his health issues and since he retired he is home 24/7)...    She feels Xanax helps anxiety some but used to help more.  Cymbalta has not helped her much.      MS History:  She was diagnosed with multiple sclerosis more than 20 years ago. Initially, she was treated with Betaseron then Copaxone but has not been on any disease modifying therapy for about 8 years. During this time, she has had occasional  Exacerbations with more fatigue and gait issues.  She also has noted mild cognitive dysfunction worsening over the past 5 or 6 years. Her MRI of the brain performed 08/31/2012 shows foci that are predominantly periventricular. They are consistent with the diagnosis of multiple sclerosis. When this MRI was compared to an MRI dated 10/12/2010, there was one  additional small periventricular focus. There were no acute findings on either MRI.   In November 2015, she had an exacerbation with severe fatigue, worsened gait and dysphagia.    She started Aubagio 09/09/14.        REVIEW OF SYSTEMS:  Constitutional: No fevers, chills, sweats, or change in appetite.  She notes a lot of fatigue. She is sleeping well. Eyes: No visual changes, double vision, eye pain Ear, nose and throat: No hearing loss, ear pain, nasal congestion, sore throat Cardiovascular: No chest pain, palpitations Respiratory:  No shortness of breath at rest or with exertion.   No wheezes GastrointestinaI: No nausea, vomiting, diarrhea.  Has constipation Genitourinary:  see above. Musculoskeletal:  reports pain in knees.   Reports neck pain and back pain Integumentary: No rash, pruritus, skin  lesions Neurological: as above Psychiatric: Depression and  Anxiety noted Endocrine: No palpitations, diaphoresis, change in appetite, change in weigh or increased thirst Hematologic/Lymphatic:  No anemia, purpura, petechiae. Allergic/Immunologic: No itchy/runny eyes, nasal congestion, recent allergic reactions, rashes  ALLERGIES: Allergies  Allergen Reactions  . Codeine Nausea And Vomiting    vomiting  . Demerol [Meperidine] Nausea And Vomiting  . Penicillins   . Stadol [Butorphanol] Other (See Comments)    Per patient psychotic event  . Sulfonamide Derivatives Hives    HOME MEDICATIONS: Outpatient Prescriptions Prior to Visit  Medication Sig Dispense Refill  . acetaminophen (TYLENOL) 500 MG tablet Take 500 mg by mouth 4 (four) times daily.    Marland Kitchen ALPRAZolam (XANAX) 0.5 MG tablet Take 1 tablet (0.5 mg total) by mouth 2 (two) times daily. 60 tablet 5  . amphetamine-dextroamphetamine (ADDERALL XR) 30 MG 24 hr capsule Take 1 capsule (30 mg total) by mouth daily. 30 capsule 0  . Calcium Carb-Cholecalciferol (CALCIUM 600 + D PO) Take 1 tablet by mouth daily.    . clonazePAM (KLONOPIN) 1 MG tablet TAKE 1 TABLET IN THE MORNING, 1 TABLET IN THE EVENING, AND 2 TABLETS AT BEDTIME (MAX 3 PER DAY) 360 tablet 1  . DULoxetine (CYMBALTA) 60 MG capsule Take 1 capsule (60 mg total) by mouth daily. 60 capsule 6  . gabapentin (NEURONTIN) 600 MG tablet Take 1 tablet (600 mg total) by mouth 4 (four) times daily. 360 tablet 3  . HYDROcodone-acetaminophen (NORCO) 10-325 MG tablet Take 1 tablet by mouth 3 (three) times daily as needed. 90 tablet 0  . HYDROcodone-acetaminophen (NORCO) 7.5-325 MG tablet Take 1 tablet by mouth every 6 (six) hours as needed for moderate pain. 30 tablet 0  . imipramine (TOFRANIL) 25 MG tablet Take 2 tablets at bedtime. 60 tablet 3  . Multiple Vitamin (MULTIVITAMIN) tablet Take 1 tablet by mouth daily.    Marland Kitchen oxybutynin (DITROPAN-XL) 10 MG 24 hr tablet Take 1 tablet by mouth daily.     . traMADol (ULTRAM) 50 MG tablet Take 1 tablet (50 mg total) by mouth every 6 (six) hours as needed. 120 tablet 5  . cetirizine (ZYRTEC) 10 MG tablet Take 10 mg by mouth daily.    . clindamycin (CLEOCIN) 300 MG capsule Take 1 capsule (300 mg total) by mouth 3 (three) times daily. 15 capsule 0  . promethazine (PHENERGAN) 25 MG suppository Place 1 suppository (25 mg total) rectally every 6 (six) hours as needed for nausea or vomiting. 12 suppository 1  . rizatriptan (MAXALT) 10 MG tablet Take 10 mg by mouth as needed. May repeat in 2 hours if needed    . Teriflunomide 14 MG TABS Take 14  mg by mouth daily.     No facility-administered medications prior to visit.    PAST MEDICAL HISTORY: Past Medical History  Diagnosis Date  . Colon polyp   . Headache(784.0)   . Osteoarthritis   . Multiple sclerosis ([place]) since 1992    Dr. Arlice Colt in [facility]  . Ulcerative proctitis ([place])   . Osteoporosis     last DEXA 08-28-07  . Gynecological examination     sees Dr. Elyse Hsu   . Multiple thyroid nodules 1995    per pt. resolved on its own follwed by endo then released  . GERD (gastroesophageal reflux disease)   . Depression   . Bulging lumbar disc 1980  . Pancreatitis 1977  . Neurogenic bladder disorder 12/2009    w/ urge & stress inc. dx. Dr. McDiarmid  . Neuropathy ([place])   . Vision abnormalities   . Anxiety   . Overactive bladder     sees Dr. Nicki Reaper McDiarmid   . Chronic pansinusitis     PAST SURGICAL HISTORY: Past Surgical History  Procedure Laterality Date  . Appendectomy    . Tonsillectomy    . [place] teeth removal    . Dilation and curettage of uterus    . Left knee surgery  1978    to remove bone tumor    . Cholecystectomy    . Laparoscopy      x 3  . Nasal turbinate reduction    . Breast ruptured  left    ductal cyst  . Vaginal cystocele repair      anterior  . Colonoscopy  01/10/09    repeat in 5 yrs Dr. Wilford Corner  . Pubo vaginal sling  10/2003  .  Vaginal hysterectomy  1995    AUB & Endometriosis  . Nasal sinus surgery Bilateral 01/13/2015    Procedure: ENDOSCOPIC SINUS SURGERY;  Surgeon: Izora Gala, MD;  Location: [facility];  Service: ENT;  Laterality: Bilateral;    FAMILY HISTORY: Family History  Problem Relation Age of Onset  . Osteoporosis Mother     severe  . Cancer Mother 71    colon /Breast also m. niece at 84's  . Osteopenia Sister     X 2    SOCIAL HISTORY:  Social History   Social History  . Marital Status: Married    Spouse Name: Jori Moll  . Number of Children: 1  . Years of Education: 16   Occupational History  . BS Nursing   . [employer] volunteer    Social History Main Topics  . Smoking status: Former Smoker -- 1.00 packs/day for 5 years    Types: Cigarettes    Quit date: 03/09/1979  . Smokeless tobacco: Never Used  . Alcohol Use: No  . Drug Use: No  . Sexual Activity: Not Currently    Birth Control/ Protection: Surgical   Other Topics Concern  . Not on file   Social History Narrative     PHYSICAL EXAM  Filed Vitals:   02/12/15 1306  BP: 115/66  Pulse: 94  Height: 5\' 6"  (1.676 m)  Weight: 124 lb 9.6 oz (56.518 kg)    Body mass index is 20.12 kg/(m^2).   General: The patient is well-developed and well-nourished and in no acute distress  Neurologic Exam  Mental status: The patient is alert and oriented x 3 at the time of the examination. The patient has apparent normal recent and remote memory, with an apparently normal attention span and  concentration ability.   Speech is normal.  Cranial nerves: Extraocular movements are full.   Facial symmetry is present. There is good facial sensation to soft touch bilaterally.Facial strength is normal.  Trapezius and sternocleidomastoid strength is normal. No dysarthria is noted.   No obvious hearing deficits are noted.  Motor:  Muscle bulk is normal and tone is increased in legs. Strength is  5 / 5 in all 4 extremities.    Sensory: Sensory testing is intact to pinprick, soft touch, vibration sensation in arms but decreased touch in left leg.    Coordination: Cerebellar testing reveals good finger-nose-finger and slightly reduced heel-to-shin bilaterally.  Gait and station: Station is stable and gait is mildly wide and arthritic. Tandem gait is wide. Romberg is negative.   Reflexes: Deep tendon reflexes are symmetric and increased in knees (spread) and ankles (2 beatrs clonus nonsustained).Marland Kitchen     DIAGNOSTIC DATA (LABS, IMAGING, TESTING) - I reviewed patient records, labs, notes, testing and imaging myself where available.      ASSESSMENT AND PLAN  MULTIPLE SCLEROSIS  Ataxic gait  Chronic fatigue  Depression with anxiety  High risk medication use  1.  continue Aubagio - check LFT  2.   Stop imipramine to see if dry mouth better 3.  Renew Adderall but change to IR instead of XR.  Continue xanax 4.   Stay active, exercise 5.   rtc 4 months, sooner if worse  Graysin Luczynski A. Felecia Shelling, MD, PhD [id number], [id number] PM Certified in Neurology, Clinical Neurophysiology, Sleep Medicine, Pain Medicine and Neuroimaging  [facility] [address], [place], [place] [address] [phone]

## 2015-02-13 ENCOUNTER — Telehealth: Payer: Self-pay | Admitting: *Deleted

## 2015-02-13 LAB — HEPATIC FUNCTION PANEL
ALT: 39 IU/L — ABNORMAL HIGH (ref 0–32)
AST: 34 IU/L (ref 0–40)
Albumin: 4 g/dL (ref 3.6–4.8)
Alkaline Phosphatase: 85 IU/L (ref 39–117)
Bilirubin Total: <0.2 mg/dL (ref 0.0–1.2)
Bilirubin, Direct: 0.07 mg/dL (ref 0.00–0.40)
TOTAL PROTEIN: 6.7 g/dL (ref 6.0–8.5)

## 2015-02-13 NOTE — Telephone Encounter (Signed)
-----   Message from Britt Bottom, MD sent at 02/13/2015 11:24 AM EST ----- Please at her know that the liver tests look good.

## 2015-02-13 NOTE — Telephone Encounter (Signed)
I have spoken with Enes and per RAS, advised that lft's look good.  She verbalized understanding of same.  LFT's have been normal for 5 mos. after starting Aubagio, so they will now be checked at reg. sched. ov's and prn/fim

## 2015-03-19 ENCOUNTER — Encounter: Payer: Self-pay | Admitting: *Deleted

## 2015-03-21 ENCOUNTER — Telehealth: Payer: Self-pay | Admitting: Neurology

## 2015-03-21 MED ORDER — TERIFLUNOMIDE 14 MG PO TABS: 14.0000 mg | ORAL_TABLET | Freq: Every day | ORAL | Status: DC

## 2015-03-21 NOTE — Telephone Encounter (Signed)
Pt needs Rx for Aubagio 14mg  daily. She would like it sent to [facility]. Pt has new insurance as well. May call pt [phone]

## 2015-03-21 NOTE — Telephone Encounter (Signed)
It appears there are several IT consultant listed in [place].  I called back to clarify which location she would like the Rx sent to.  She was unsure what location it was, but had a phone of [phone] Patient ID # [id number].  Rx has been sent.  Receipt confirmed by pharmacy.

## 2015-03-25 ENCOUNTER — Telehealth: Payer: Self-pay | Admitting: Neurology

## 2015-03-25 MED ORDER — IMIPRAMINE HCL 25 MG PO TABS: 25.0000 mg | ORAL_TABLET | Freq: Every day | ORAL | Status: DC

## 2015-03-25 MED ORDER — GABAPENTIN 600 MG PO TABS: 600.0000 mg | ORAL_TABLET | Freq: Four times a day (QID) | ORAL | Status: DC

## 2015-03-25 MED ORDER — DULOXETINE HCL 60 MG PO CPEP: 60.0000 mg | ORAL_CAPSULE | Freq: Every day | ORAL | Status: DC

## 2015-03-25 NOTE — Telephone Encounter (Signed)
Patient is calling to order the following medication:  Rx Gabapentin 600 mg. She would also like to continue taking Rx Imipramine 25 mg. As of 03/09/15 the patient has a new insurance coverage:  [employer].  [facility].  Phone: [phone].

## 2015-03-25 NOTE — Telephone Encounter (Signed)
I have spoken with Sheri Burns this afternoon.  Her insurance and mail order pharmacies have changed.  Cymbalta, Imipramine and Gabapentin rx's sent to [facility]

## 2015-03-28 ENCOUNTER — Telehealth: Payer: Self-pay

## 2015-03-28 NOTE — Telephone Encounter (Signed)
[employer] ([place] has approved the request for coverage on Aubagio effective until 03/07/2016

## 2015-04-01 ENCOUNTER — Encounter: Payer: Self-pay | Admitting: *Deleted

## 2015-04-02 ENCOUNTER — Other Ambulatory Visit: Payer: Self-pay | Admitting: Neurology

## 2015-04-02 ENCOUNTER — Telehealth: Payer: Self-pay | Admitting: Neurology

## 2015-04-02 MED ORDER — OXYCODONE-ACETAMINOPHEN 5-325 MG PO TABS: 1.0000 | ORAL_TABLET | Freq: Three times a day (TID) | ORAL | Status: DC | PRN

## 2015-04-02 NOTE — Telephone Encounter (Signed)
Patient is calling. She states the pain in her left leg has increased significantly and also the left lower quadrant of her body for over a week. HYDROcodone-acetaminophen (NORCO) 7.5-325 MG tablet is not helping.

## 2015-04-02 NOTE — Telephone Encounter (Signed)
Pt called back said she is taking HYDROcodone-acetaminophen (NORCO) 10-325 MG tablet not 7.5-325 and it's not helping

## 2015-04-02 NOTE — Telephone Encounter (Signed)
I have spoken with Sheri Burns this afternoon.  She c/o lbp, buttock, leg pain, left arm numbness.  Sts. she has had back problems before and this "feels different."  She would like an appt.--appt. given tomorrow 11am/fim

## 2015-04-03 ENCOUNTER — Ambulatory Visit: Payer: Self-pay | Admitting: Neurology

## 2015-04-03 NOTE — Telephone Encounter (Signed)
Pt called said she would like to try percocet instead of coming in for appt.

## 2015-04-03 NOTE — Telephone Encounter (Signed)
Received a message from Katharine Look in the phone room that Sheri Burns has decided she would like to wait to be seen, would like Percocet rx. that RAS offered yesterday.  Rx. up front [facility]/fim

## 2015-04-07 DIAGNOSIS — L0101 Non-bullous impetigo: Secondary | ICD-10-CM | POA: Diagnosis not present

## 2015-04-07 DIAGNOSIS — L03011 Cellulitis of right finger: Secondary | ICD-10-CM | POA: Diagnosis not present

## 2015-04-08 ENCOUNTER — Other Ambulatory Visit: Payer: Self-pay | Admitting: Neurology

## 2015-04-08 MED ORDER — AMPHETAMINE-DEXTROAMPHETAMINE 20 MG PO TABS: 20.0000 mg | ORAL_TABLET | Freq: Every day | ORAL | Status: DC

## 2015-04-08 NOTE — Telephone Encounter (Signed)
Pt called requesting refill for amphetamine-dextroamphetamine (ADDERALL) 20 MG tablet . Thank you

## 2015-04-09 ENCOUNTER — Encounter: Payer: Self-pay | Admitting: *Deleted

## 2015-04-09 NOTE — Progress Notes (Signed)
Adderall rx. up front [facility]im 

## 2015-04-14 ENCOUNTER — Other Ambulatory Visit: Payer: Self-pay | Admitting: Neurology

## 2015-04-14 ENCOUNTER — Telehealth: Payer: Self-pay | Admitting: Neurology

## 2015-04-14 MED ORDER — IMIPRAMINE HCL 25 MG PO TABS: ORAL_TABLET | ORAL | Status: DC

## 2015-04-14 NOTE — Telephone Encounter (Signed)
Pt called and had a rx sent to her new mailorder pharm. She noticed that the rx calls for one pill at bedtime, however she has always take 2 at bed time of imipramine (TOFRANIL) 25 MG tablet. She is requesting a new rx be sent to [facility] with the corrections. May call pt at [phone]

## 2015-04-14 NOTE — Telephone Encounter (Signed)
New rx. for Imipramine 25mg , 2 at hs sent to [facility].  I have spoken with pt. and made her aware/fim

## 2015-04-23 ENCOUNTER — Other Ambulatory Visit: Payer: Self-pay | Admitting: Neurology

## 2015-05-05 ENCOUNTER — Telehealth: Payer: Self-pay | Admitting: Neurology

## 2015-05-05 MED ORDER — TRAMADOL HCL 50 MG PO TABS: 50.0000 mg | ORAL_TABLET | Freq: Four times a day (QID) | ORAL | Status: DC | PRN

## 2015-05-05 NOTE — Telephone Encounter (Signed)
Rx. faxed to [facility] per pt's request/fim 

## 2015-05-05 NOTE — Telephone Encounter (Signed)
Pt called requesting refill for traMADol (ULTRAM) 50 MG tablet . Please send to [facility]

## 2015-06-05 DIAGNOSIS — F325 Major depressive disorder, single episode, in full remission: Secondary | ICD-10-CM | POA: Diagnosis not present

## 2015-06-05 DIAGNOSIS — M1712 Unilateral primary osteoarthritis, left knee: Secondary | ICD-10-CM | POA: Diagnosis not present

## 2015-06-05 DIAGNOSIS — N319 Neuromuscular dysfunction of bladder, unspecified: Secondary | ICD-10-CM | POA: Diagnosis not present

## 2015-06-05 DIAGNOSIS — Z8709 Personal history of other diseases of the respiratory system: Secondary | ICD-10-CM | POA: Diagnosis not present

## 2015-06-05 DIAGNOSIS — Z8719 Personal history of other diseases of the digestive system: Secondary | ICD-10-CM | POA: Diagnosis not present

## 2015-06-05 DIAGNOSIS — M19041 Primary osteoarthritis, right hand: Secondary | ICD-10-CM | POA: Diagnosis not present

## 2015-06-05 DIAGNOSIS — G2581 Restless legs syndrome: Secondary | ICD-10-CM | POA: Diagnosis not present

## 2015-06-05 DIAGNOSIS — M19042 Primary osteoarthritis, left hand: Secondary | ICD-10-CM | POA: Diagnosis not present

## 2015-06-05 DIAGNOSIS — G35 Multiple sclerosis: Secondary | ICD-10-CM | POA: Diagnosis not present

## 2015-06-05 DIAGNOSIS — D126 Benign neoplasm of colon, unspecified: Secondary | ICD-10-CM | POA: Diagnosis not present

## 2015-06-05 DIAGNOSIS — G43709 Chronic migraine without aura, not intractable, without status migrainosus: Secondary | ICD-10-CM | POA: Diagnosis not present

## 2015-06-06 ENCOUNTER — Telehealth: Payer: Self-pay | Admitting: Neurology

## 2015-06-06 MED ORDER — OXYCODONE-ACETAMINOPHEN 5-325 MG PO TABS: 1.0000 | ORAL_TABLET | Freq: Three times a day (TID) | ORAL | Status: DC | PRN

## 2015-06-06 NOTE — Telephone Encounter (Signed)
Rx. up front [facility].  Pt. aware/fim 

## 2015-06-06 NOTE — Telephone Encounter (Signed)
Pt requested refill for oxyCODONE-acetaminophen (ROXICET) 5-325 MG tablet. Sts she is having increased leg pain and the hydrocodone is ineffective. Pt is aware [facility] closes at 12 today

## 2015-06-06 NOTE — Telephone Encounter (Signed)
Rx. awaiting RAS sig/fim 

## 2015-06-11 ENCOUNTER — Telehealth: Payer: Self-pay | Admitting: Neurology

## 2015-06-11 MED ORDER — AMPHETAMINE-DEXTROAMPHETAMINE 20 MG PO TABS: 20.0000 mg | ORAL_TABLET | Freq: Every day | ORAL | Status: DC

## 2015-06-11 NOTE — Telephone Encounter (Signed)
Pt request refill for amphetamine-dextroamphetamine (ADDERALL) 20 MG tablet °

## 2015-06-11 NOTE — Telephone Encounter (Signed)
Rx. awaiting RAS sig/fim 

## 2015-06-11 NOTE — Telephone Encounter (Signed)
Adderall rx. up front [facility]/fim 

## 2015-06-15 ENCOUNTER — Other Ambulatory Visit: Payer: Self-pay | Admitting: Neurology

## 2015-06-19 ENCOUNTER — Other Ambulatory Visit: Payer: Self-pay | Admitting: *Deleted

## 2015-06-19 DIAGNOSIS — G35 Multiple sclerosis: Secondary | ICD-10-CM

## 2015-06-19 MED ORDER — CLONAZEPAM 1 MG PO TABS: ORAL_TABLET | ORAL | Status: DC

## 2015-06-24 ENCOUNTER — Other Ambulatory Visit: Payer: Self-pay

## 2015-06-24 DIAGNOSIS — Z1231 Encounter for screening mammogram for malignant neoplasm of breast: Secondary | ICD-10-CM

## 2015-06-26 DIAGNOSIS — J322 Chronic ethmoidal sinusitis: Secondary | ICD-10-CM | POA: Diagnosis not present

## 2015-07-16 ENCOUNTER — Encounter: Payer: Self-pay | Admitting: Neurology

## 2015-07-16 ENCOUNTER — Ambulatory Visit (INDEPENDENT_AMBULATORY_CARE_PROVIDER_SITE_OTHER): Payer: PPO | Admitting: Neurology

## 2015-07-16 VITALS — BP 130/86 | HR 74 | Resp 14 | Ht 66.0 in | Wt 126.0 lb

## 2015-07-16 DIAGNOSIS — M5432 Sciatica, left side: Secondary | ICD-10-CM | POA: Diagnosis not present

## 2015-07-16 DIAGNOSIS — F418 Other specified anxiety disorders: Secondary | ICD-10-CM | POA: Diagnosis not present

## 2015-07-16 DIAGNOSIS — R5382 Chronic fatigue, unspecified: Secondary | ICD-10-CM

## 2015-07-16 DIAGNOSIS — N318 Other neuromuscular dysfunction of bladder: Secondary | ICD-10-CM | POA: Diagnosis not present

## 2015-07-16 DIAGNOSIS — Z79899 Other long term (current) drug therapy: Secondary | ICD-10-CM

## 2015-07-16 DIAGNOSIS — R260 Ataxic gait: Secondary | ICD-10-CM | POA: Diagnosis not present

## 2015-07-16 DIAGNOSIS — G35D Multiple sclerosis, unspecified: Secondary | ICD-10-CM

## 2015-07-16 DIAGNOSIS — M1712 Unilateral primary osteoarthritis, left knee: Secondary | ICD-10-CM | POA: Diagnosis not present

## 2015-07-16 DIAGNOSIS — G35 Multiple sclerosis: Secondary | ICD-10-CM

## 2015-07-16 MED ORDER — FENTANYL 25 MCG/HR TD PT72: 25.0000 ug | MEDICATED_PATCH | TRANSDERMAL | Status: DC

## 2015-07-16 MED ORDER — AMPHETAMINE-DEXTROAMPHETAMINE 20 MG PO TABS: 20.0000 mg | ORAL_TABLET | Freq: Every day | ORAL | Status: DC

## 2015-07-16 NOTE — Progress Notes (Signed)
[facility]  PATIENT: Sheri Burns DOB: October 17, 1955  REFERRING CLINICIAN: Thressa Sheller  HISTORY FROM: Patient REASON FOR VISIT: MS   HISTORICAL  CHIEF COMPLAINT:  Chief Complaint  Patient presents with  . Multiple Sclerosis    Sts. she continues to tolerate Aubagio well.  Sts. left hip, left leg  pain is worse./fim    HISTORY OF PRESENT ILLNESS:  Sheri Burns is a 60 year old woman with MS.   She is on Aubagio and tolerates it well.   No MS exacerbations noted but she is noting much more left leg pain/hip pain.      Pain:   She reports pain from the left hip to the bottom of her foot    Pain worsens as the day goes on. No position makes pain better.   She feels oxycodone 5 mg has not helped.    Taking 1 1/2 also did not help.    She also notes weakness in that leg.    Gait/strength/sensation:    She feels gait is stable with occasional stumbling but no recent fall.    She reportsleft leg and right foot has pain/dysesthesia (burning).  She takes gabapentin 600 mg po qid and is not sure she gets much benefit.  Leg pain increases with standing and worsens as the day goes on.  She has mild weakness and stiffness in her legs  Lamotrigine did not help the dysesthetic pain.   Imipramine did not help  Dysphagia:   She has had dysphagia > 1 year that is unchanged since the last visit.   Endoscopy has been performed.   Her mouth is dry and dryness is better without imipramine.   Food sometimes gets stuck and she occasionally coughs after eating.     Bladder:   She has bladder dysfunction, helped by oxybutynin .    No recent incontinence   On oxybutynin, frequency and incontinence improved but hesitancy worsened.     Fatigue/Sleep:   She is having fatigue, worse s day goes on.  She is often feels drained by mid afternoon.    Adderall helps some (20 in am and 10 at noon).   She changed to her own bedroom and now sleeps better (up to 9 hours).     She wakes up with  pain.      Cognitive:   She feels she is worse with cognition and is noting difficulty coming up with the right words and STM.    Focus and attention are poor.  Sometimes she processes very slowly (verbal and reading)  Cognition, fatigue and attention are all better with Adderall.  Mood: Mood is doing ok but she strill notes some depression .    She is on  Cymbalta.  She reports more stress with husband's issues (she feels he constantly talks about his health issues and since he retired he is home 24/7)...    She feels Xanax helps anxiety some but used to help more.      MS History:  She was diagnosed with multiple sclerosis more than 20 years ago. Initially, she was treated with Betaseron then Copaxone but has not been on any disease modifying therapy for about 8 years. During this time, she has had occasional  Exacerbations with more fatigue and gait issues.  She also has noted mild cognitive dysfunction worsening over the past 5 or 6 years. Her MRI of the brain performed 08/31/2012 shows foci that are predominantly periventricular. They are consistent with the  diagnosis of multiple sclerosis. When this MRI was compared to an MRI dated 10/12/2010, there was one additional small periventricular focus. There were no acute findings on either MRI.   In November 2015, she had an exacerbation with severe fatigue, worsened gait and dysphagia.    She started Aubagio 09/09/14.        REVIEW OF SYSTEMS:  Constitutional: No fevers, chills, sweats, or change in appetite.  She notes a lot of fatigue. She is sleeping well. Eyes: No visual changes, double vision, eye pain Ear, nose and throat: No hearing loss, ear pain, nasal congestion, sore throat Cardiovascular: No chest pain, palpitations Respiratory:  No shortness of breath at rest or with exertion.   No wheezes GastrointestinaI: No nausea, vomiting, diarrhea.  Has constipation Genitourinary:  see above. Musculoskeletal:  reports pain in knees.   Reports  neck pain and back pain Integumentary: No rash, pruritus, skin lesions Neurological: as above Psychiatric: Depression and  Anxiety noted Endocrine: No palpitations, diaphoresis, change in appetite, change in weigh or increased thirst Hematologic/Lymphatic:  No anemia, purpura, petechiae. Allergic/Immunologic: No itchy/runny eyes, nasal congestion, recent allergic reactions, rashes  ALLERGIES: Allergies  Allergen Reactions  . Codeine Nausea And Vomiting    vomiting  . Demerol [Meperidine] Nausea And Vomiting  . Penicillins   . Stadol [Butorphanol] Other (See Comments)    Per patient psychotic event  . Sulfonamide Derivatives Hives    HOME MEDICATIONS: Outpatient Prescriptions Prior to Visit  Medication Sig Dispense Refill  . acetaminophen (TYLENOL) 500 MG tablet Take 500 mg by mouth 4 (four) times daily.    Marland Kitchen ALPRAZolam (XANAX) 0.5 MG tablet TAKE 1 TABLET BY MOUTH TWICE DAILY 60 tablet 5  . amphetamine-dextroamphetamine (ADDERALL) 20 MG tablet Take 1 tablet (20 mg total) by mouth daily. 60 tablet 0  . Calcium Carb-Cholecalciferol (CALCIUM 600 + D PO) Take 1 tablet by mouth daily.    . clonazePAM (KLONOPIN) 1 MG tablet TAKE 1 TABLET THREE TIMES DAILY AS NEEDED. 90 tablet 2  . DULoxetine (CYMBALTA) 60 MG capsule Take 1 capsule (60 mg total) by mouth daily. 90 capsule 3  . gabapentin (NEURONTIN) 600 MG tablet Take 1 tablet (600 mg total) by mouth 4 (four) times daily. 360 tablet 3  . Multiple Vitamin (MULTIVITAMIN) tablet Take 1 tablet by mouth daily.    Marland Kitchen oxyCODONE-acetaminophen (ROXICET) 5-325 MG tablet Take 1 tablet by mouth every 8 (eight) hours as needed for severe pain. 90 tablet 0  . Teriflunomide 14 MG TABS Take 14 mg by mouth daily. 30 tablet 6  . traMADol (ULTRAM) 50 MG tablet Take 1 tablet (50 mg total) by mouth every 6 (six) hours as needed. 120 tablet 5  . imipramine (TOFRANIL) 25 MG tablet Take 2 tablets at bedtime. (Patient not taking: Reported on 07/16/2015) 180 tablet 3    . oxybutynin (DITROPAN-XL) 10 MG 24 hr tablet Take 1 tablet by mouth daily. Reported on 07/16/2015     No facility-administered medications prior to visit.    PAST MEDICAL HISTORY: Past Medical History  Diagnosis Date  . Colon polyp   . Headache(784.0)   . Osteoarthritis   . Multiple sclerosis ([place]) since 1992    Dr. Arlice Colt in [facility]  . Ulcerative proctitis ([place])   . Osteoporosis     last DEXA 08-28-07  . Gynecological examination     sees Dr. Elyse Hsu   . Multiple thyroid nodules 1995    per pt. resolved on its own follwed  by endo then released  . GERD (gastroesophageal reflux disease)   . Depression   . Bulging lumbar disc 1980  . Pancreatitis 1977  . Neurogenic bladder disorder 12/2009    w/ urge & stress inc. dx. Dr. McDiarmid  . Neuropathy ([place])   . Vision abnormalities   . Anxiety   . Overactive bladder     sees Dr. Nicki Reaper McDiarmid   . Chronic pansinusitis     PAST SURGICAL HISTORY: Past Surgical History  Procedure Laterality Date  . Appendectomy    . Tonsillectomy    . [place] teeth removal    . Dilation and curettage of uterus    . Left knee surgery  1978    to remove bone tumor    . Cholecystectomy    . Laparoscopy      x 3  . Nasal turbinate reduction    . Breast ruptured  left    ductal cyst  . Vaginal cystocele repair      anterior  . Colonoscopy  01/10/09    repeat in 5 yrs Dr. Wilford Corner  . Pubo vaginal sling  10/2003  . Vaginal hysterectomy  1995    AUB & Endometriosis  . Nasal sinus surgery Bilateral 01/13/2015    Procedure: ENDOSCOPIC SINUS SURGERY;  Surgeon: Izora Gala, MD;  Location: [facility];  Service: ENT;  Laterality: Bilateral;    FAMILY HISTORY: Family History  Problem Relation Age of Onset  . Osteoporosis Mother     severe  . Cancer Mother 48    colon /Breast also m. niece at 36's  . Osteopenia Sister     X 2    SOCIAL HISTORY:  Social History   Social History  . Marital Status:  Married    Spouse Name: Jori Moll  . Number of Children: 1  . Years of Education: 16   Occupational History  . BS Nursing   . [employer] volunteer    Social History Main Topics  . Smoking status: Former Smoker -- 1.00 packs/day for 5 years    Types: Cigarettes    Quit date: 03/09/1979  . Smokeless tobacco: Never Used  . Alcohol Use: No  . Drug Use: No  . Sexual Activity: Not Currently    Birth Control/ Protection: Surgical   Other Topics Concern  . Not on file   Social History Narrative     PHYSICAL EXAM  Filed Vitals:   07/16/15 1103  BP: 130/86  Pulse: 74  Resp: 14  Height: 5\' 6"  (1.676 m)  Weight: 126 lb (57.153 kg)    Body mass index is 20.35 kg/(m^2).   General: The patient is well-developed and well-nourished and in no acute distress.    She has moderately severe tenderness over the left piriformis muscle below the SI joint on the left. She has mild tenderness at the SI joint on the left and the trochanteric bursa on the left. No Faber sign.  Neurologic Exam  Mental status: The patient is alert and oriented x 3 at the time of the examination. The patient has apparent normal recent and remote memory, with an apparently normal attention span and concentration ability.   Speech is normal.  Cranial nerves: Extraocular movements are full.   Facial symmetry is present. There is good facial sensation to soft touch bilaterally.Facial strength is normal.  Trapezius and sternocleidomastoid strength is normal. No dysarthria is noted.   No obvious hearing deficits are noted.  Motor:  Muscle bulk is normal  and tone is increased in legs. Strength is  5 / 5 in all 4 extremities.   Sensory: Sensory testing is intact to pinprick, soft touch, vibration sensation in arms but decreased touch/temp/vib in left leg.     Coordination: Cerebellar testing reveals good finger-nose-finger and slightly reduced heel-to-shin bilaterally.  Gait and station: Station is stable and gait is mildly  wide and arthritic. Tandem gait is wide. Romberg is negative.   Reflexes: Deep tendon reflexes are symmetric and increased in knees (spread) and ankles (2 beatrs clonus nonsustained).Marland Kitchen     DIAGNOSTIC DATA (LABS, IMAGING, TESTING) - I reviewed patient records, labs, notes, testing and imaging myself where available.      ASSESSMENT AND PLAN  MULTIPLE SCLEROSIS  Ataxic gait  Chronic fatigue  Depression with anxiety  High risk medication use  OVERACTIVE BLADDER  Sciatica of left side    1.   continue Aubagio.   2.   Left piriformis TPI with 80 mg Depo-Medrol in [place] using sterile technique.   She tolerated the procedure well. 3.   Renew Adderall  IR.  Change oxycodone to fentanyl. 4.   Stay active, exercise 5.   rtc 4 months, sooner if worse  Richard A. Felecia Shelling, MD, PhD [id number], 0000000 AM Certified in Neurology, Clinical Neurophysiology, Sleep Medicine, Pain Medicine and Neuroimaging  [facility] [address], [place], [place] [address] [phone]

## 2015-07-28 ENCOUNTER — Ambulatory Visit
Admission: RE | Admit: 2015-07-28 | Discharge: 2015-07-28 | Disposition: A | Discharge status: Home / Self Care | Payer: PPO | Source: Ambulatory Visit

## 2015-07-28 ENCOUNTER — Telehealth: Payer: Self-pay | Admitting: Neurology

## 2015-07-28 DIAGNOSIS — Z1231 Encounter for screening mammogram for malignant neoplasm of breast: Secondary | ICD-10-CM

## 2015-07-28 MED ORDER — AMPHETAMINE-DEXTROAMPHETAMINE 20 MG PO TABS: ORAL_TABLET | ORAL | Status: DC

## 2015-07-28 NOTE — Telephone Encounter (Signed)
Rx. given on 07-16-15 was for #60, but instructions were still for one daily, so ins. will not cover #60.  New rx. up front [facility]/fim

## 2015-07-28 NOTE — Telephone Encounter (Signed)
Patient is calling and states she was last given a written Rx amphetamine-dextroamphetamine 20 mg(Adderall) with instructions to take 1 tablet per day.  She discussed taking 1 1/2 tablets per day with Dr. Felecia Shelling.  She went to have her Rx filled today and they told her it was too soon.  She needs a new written Rx today to pick up today if possible as she is completely out now. Thanks!

## 2015-08-11 ENCOUNTER — Telehealth: Payer: Self-pay | Admitting: Neurology

## 2015-08-11 MED ORDER — FENTANYL 25 MCG/HR TD PT72: 25.0000 ug | MEDICATED_PATCH | TRANSDERMAL | Status: DC

## 2015-08-11 NOTE — Telephone Encounter (Signed)
Rx. awaiting RAS sig/fim 

## 2015-08-11 NOTE — Telephone Encounter (Signed)
Fentanyl rx. up front [facility]/fim 

## 2015-08-21 DIAGNOSIS — M1712 Unilateral primary osteoarthritis, left knee: Secondary | ICD-10-CM | POA: Diagnosis not present

## 2015-08-25 ENCOUNTER — Telehealth: Payer: Self-pay | Admitting: Neurology

## 2015-08-25 DIAGNOSIS — F32A Depression, unspecified: Secondary | ICD-10-CM

## 2015-08-25 DIAGNOSIS — G35D Multiple sclerosis, unspecified: Secondary | ICD-10-CM

## 2015-08-25 DIAGNOSIS — F329 Major depressive disorder, single episode, unspecified: Secondary | ICD-10-CM

## 2015-08-25 DIAGNOSIS — F411 Generalized anxiety disorder: Secondary | ICD-10-CM

## 2015-08-25 DIAGNOSIS — G35 Multiple sclerosis: Secondary | ICD-10-CM

## 2015-08-25 NOTE — Telephone Encounter (Signed)
Patient called requesting to speak to Faith or Dr. Felecia Shelling regarding names of Therapists that they would recommend for mental health.

## 2015-08-25 NOTE — Telephone Encounter (Signed)
LMTC./fim 

## 2015-08-25 NOTE — Telephone Encounter (Signed)
I have spoken with [name], offered referral, but advised that RAS and I do not have much personal knowledge of counselors in [place] yet.  She verbalized understanding of same, declined referral at this time./fim

## 2015-08-25 NOTE — Telephone Encounter (Signed)
Patient is returning your call and request call back [phone].  Thanks!

## 2015-08-28 NOTE — Addendum Note (Signed)
Addended by: France Ravens I on: 08/28/2015 01:40 PM   Modules accepted: Orders

## 2015-08-28 NOTE — Telephone Encounter (Signed)
Patient is calling back and states she has located a counselor and has been told this will require a PA.  The counselor is:  [facility], [address] [place]. [id number].  Please call patient back at [phone]. If no answer please call cell [phone].  Thanks!

## 2015-08-28 NOTE — Telephone Encounter (Signed)
I have spoken with Lysbeth Galas and referral for tx. of dep/anx. ordered as requested below/fim

## 2015-09-03 ENCOUNTER — Telehealth: Payer: Self-pay | Admitting: Neurology

## 2015-09-03 MED ORDER — AMPHETAMINE-DEXTROAMPHETAMINE 20 MG PO TABS: ORAL_TABLET | ORAL | Status: DC

## 2015-09-03 NOTE — Telephone Encounter (Signed)
Rx. awaiting RAS sig/fim 

## 2015-09-03 NOTE — Telephone Encounter (Signed)
Adderall rx. up front [facility]/fim 

## 2015-09-03 NOTE — Telephone Encounter (Signed)
Patient requesting refill of  amphetamine-dextroamphetamine (ADDERALL) 20 MG tablet. I advised the Rx will be ready in 24 hours unless the nurse advises otherwise.

## 2015-09-10 ENCOUNTER — Telehealth: Payer: Self-pay | Admitting: Neurology

## 2015-09-10 MED ORDER — FENTANYL 25 MCG/HR TD PT72: 25.0000 ug | MEDICATED_PATCH | TRANSDERMAL | Status: DC

## 2015-09-10 NOTE — Telephone Encounter (Signed)
Patient requesting refill of fentaNYL (DURAGESIC) 25 MCG/HR patch. ° ° °

## 2015-09-10 NOTE — Telephone Encounter (Signed)
Rx. awaiting YY sig, as RAS is ooo this week/fim 

## 2015-09-11 NOTE — Telephone Encounter (Signed)
Fentanyl rx. up front [facility]/fim 

## 2015-09-22 ENCOUNTER — Other Ambulatory Visit: Payer: Self-pay | Admitting: Neurology

## 2015-09-24 ENCOUNTER — Ambulatory Visit (INDEPENDENT_AMBULATORY_CARE_PROVIDER_SITE_OTHER): Payer: PPO | Admitting: Licensed Clinical Social Worker

## 2015-09-24 ENCOUNTER — Other Ambulatory Visit: Payer: Self-pay | Admitting: Neurology

## 2015-09-24 DIAGNOSIS — F4323 Adjustment disorder with mixed anxiety and depressed mood: Secondary | ICD-10-CM | POA: Diagnosis not present

## 2015-10-02 DIAGNOSIS — G35 Multiple sclerosis: Secondary | ICD-10-CM | POA: Diagnosis not present

## 2015-10-02 DIAGNOSIS — Z0100 Encounter for examination of eyes and vision without abnormal findings: Secondary | ICD-10-CM | POA: Diagnosis not present

## 2015-10-02 DIAGNOSIS — H2513 Age-related nuclear cataract, bilateral: Secondary | ICD-10-CM | POA: Diagnosis not present

## 2015-10-07 ENCOUNTER — Telehealth: Payer: Self-pay | Admitting: Neurology

## 2015-10-07 MED ORDER — FENTANYL 25 MCG/HR TD PT72: 25.0000 ug | MEDICATED_PATCH | TRANSDERMAL | 0 refills | Status: DC

## 2015-10-07 NOTE — Telephone Encounter (Signed)
Fentanyl RX is ready for pick up at the front desk. I spoke to pt and advised her of this information. Pt verbalized understanding.

## 2015-10-07 NOTE — Telephone Encounter (Signed)
Rx post dated, printed, and on desk waiting for signature.

## 2015-10-07 NOTE — Telephone Encounter (Signed)
Patient is calling to order a new written Rx fentaNYL 25 MCG/HR.  Thanks!

## 2015-10-09 DIAGNOSIS — J322 Chronic ethmoidal sinusitis: Secondary | ICD-10-CM | POA: Diagnosis not present

## 2015-10-15 ENCOUNTER — Telehealth: Payer: Self-pay | Admitting: Neurology

## 2015-10-15 ENCOUNTER — Other Ambulatory Visit: Payer: Self-pay | Admitting: Neurology

## 2015-10-15 MED ORDER — AMPHETAMINE-DEXTROAMPHETAMINE 20 MG PO TABS: ORAL_TABLET | ORAL | 0 refills | Status: DC

## 2015-10-15 NOTE — Telephone Encounter (Signed)
Xanax and Adderall rx's up front [facility]/fim

## 2015-10-15 NOTE — Telephone Encounter (Signed)
Rx. awaiting RAS sig/fim 

## 2015-10-15 NOTE — Telephone Encounter (Signed)
Patient requesting refill of amphetamine-dextroamphetamine (ADDERALL) 20 MG tablet. ° ° °

## 2015-10-30 ENCOUNTER — Ambulatory Visit (INDEPENDENT_AMBULATORY_CARE_PROVIDER_SITE_OTHER): Payer: PPO | Admitting: Neurology

## 2015-10-30 ENCOUNTER — Encounter: Payer: Self-pay | Admitting: Neurology

## 2015-10-30 VITALS — BP 122/64 | HR 66 | Ht 66.0 in | Wt 126.0 lb

## 2015-10-30 DIAGNOSIS — R260 Ataxic gait: Secondary | ICD-10-CM | POA: Diagnosis not present

## 2015-10-30 DIAGNOSIS — N318 Other neuromuscular dysfunction of bladder: Secondary | ICD-10-CM

## 2015-10-30 DIAGNOSIS — M5432 Sciatica, left side: Secondary | ICD-10-CM

## 2015-10-30 DIAGNOSIS — G35 Multiple sclerosis: Secondary | ICD-10-CM

## 2015-10-30 DIAGNOSIS — G5601 Carpal tunnel syndrome, right upper limb: Secondary | ICD-10-CM | POA: Diagnosis not present

## 2015-10-30 DIAGNOSIS — R208 Other disturbances of skin sensation: Secondary | ICD-10-CM | POA: Diagnosis not present

## 2015-10-30 DIAGNOSIS — F418 Other specified anxiety disorders: Secondary | ICD-10-CM | POA: Diagnosis not present

## 2015-10-30 MED ORDER — FENTANYL 25 MCG/HR TD PT72: 25.0000 ug | MEDICATED_PATCH | TRANSDERMAL | 0 refills | Status: DC

## 2015-10-30 MED ORDER — AMPHETAMINE-DEXTROAMPHETAMINE 20 MG PO TABS: ORAL_TABLET | ORAL | 0 refills | Status: DC

## 2015-10-30 NOTE — Progress Notes (Signed)
[facility]  PATIENT: Sheri Burns DOB: Mar 09, 1955  REFERRING CLINICIAN: Thressa Sheller  HISTORY FROM: Patient REASON FOR VISIT: MS   HISTORICAL  CHIEF COMPLAINT:  Chief Complaint  Patient presents with  . Multiple Sclerosis    Sts. she continues to tolerate Aubagio well.  Sts. pain is much improved since switching from Oxycodone to Fentanyl.  Today she has new c/o numbness in her arms, only at night, and only in the arm she is lying on./fim    HISTORY OF PRESENT ILLNESS:  Sheri Burns is a 60 year old woman with MS.   She is on Aubagio and tolerates it well.   No MS exacerbations noted but she is noting much more left leg pain/hip pain.      Pain:   She reports pain from the left > right hip/buttock region to the bottom of her foot .   LB is not too bad.    Pain worsens as the day goes on. No position makes pain better.   She is better on fentanyl patches compared to percocet pills.     Hand/wrist pain:  She has pain in her hands at the base of the thumbs/   Also wakes up with pain in the palms, right >> left and she feels mild hand weakness  Gait/strength/sensation:    She feels gait is stable with occasional stumbling but no recent fall.    She reportsleft leg and right foot has pain/dysesthesia (burning).  She gets some positional tingling in her arms.  She takes gabapentin 600 mg po qid and is not sure she gets much benefit.  Leg pain increases with standing and worsens as the day goes on.  She has mild weakness and stiffness in her legs  Lamotrigine and imipramine did not help the dysesthetic pain.    Dysphagia:   She has had dysphagia > 1 year that is unchanged since the last visit.   Endoscopy has been performed.   Her mouth is dry and dryness is better without imipramine.   Food sometimes gets stuck and she occasionally coughs after eating.     Bladder:   She has bladder dysfunction, helped by Toviaz or oxybutynin .   No recent incontinence      Fatigue/Sleep:   She is having physical and cognitive fatigue, worse as the day goes on.  She is often feels drained in mid-afternoon.    Adderall helps some (20 in am and 10 at noon).   She sleeps well most nights (up to 9 hours).     She wakes up with pain at times  Cognitive:   She feels she is worse with cognition and is noting difficulty coming up with the right words and STM.    Focus and attention are poor.  Sometimes she processes very slowly (verbal and reading)  Cognition, fatigue and attention are all better with Adderall.  Mood: Mood is doing ok but she strill notes some depression .    She is on  Cymbalta.  She reports more stress with husband's issues (she feels he constantly talks about his health issues and since he retired he is home 24/7)...    She feels Xanax helps anxiety some but used to help more.      MS History:  She was diagnosed with multiple sclerosis more than 20 years ago. Initially, she was treated with Betaseron then Copaxone but has not been on any disease modifying therapy for about 8 years. During this time,  she has had occasional  Exacerbations with more fatigue and gait issues.  She also has noted mild cognitive dysfunction worsening over the past 5 or 6 years. Her MRI of the brain performed 08/31/2012 shows foci that are predominantly periventricular. They are consistent with the diagnosis of multiple sclerosis. When this MRI was compared to an MRI dated 10/12/2010, there was one additional small periventricular focus. There were no acute findings on either MRI.   In November 2015, she had an exacerbation with severe fatigue, worsened gait and dysphagia.    She started Aubagio 09/09/14.        REVIEW OF SYSTEMS:  Constitutional: No fevers, chills, sweats, or change in appetite.  She notes a lot of fatigue. She is sleeping well. Eyes: No visual changes, double vision, eye pain Ear, nose and throat: No hearing loss, ear pain, nasal congestion, sore  throat Cardiovascular: No chest pain, palpitations Respiratory:  No shortness of breath at rest or with exertion.   No wheezes GastrointestinaI: No nausea, vomiting, diarrhea.  Has constipation Genitourinary:  see above. Musculoskeletal:  reports pain in knees.   Reports neck pain and back pain Integumentary: No rash, pruritus, skin lesions Neurological: as above Psychiatric: Depression and  Anxiety noted Endocrine: No palpitations, diaphoresis, change in appetite, change in weigh or increased thirst Hematologic/Lymphatic:  No anemia, purpura, petechiae. Allergic/Immunologic: No itchy/runny eyes, nasal congestion, recent allergic reactions, rashes  ALLERGIES: Allergies  Allergen Reactions  . Codeine Nausea And Vomiting    vomiting  . Demerol [Meperidine] Nausea And Vomiting  . Penicillins   . Stadol [Butorphanol] Other (See Comments)    Per patient psychotic event  . Sulfonamide Derivatives Hives    HOME MEDICATIONS: Outpatient Medications Prior to Visit  Medication Sig Dispense Refill  . acetaminophen (TYLENOL) 500 MG tablet Take 500 mg by mouth 4 (four) times daily.    Marland Kitchen ALPRAZolam (XANAX) 0.5 MG tablet TAKE 1 TABLET TWICE A DAY 60 tablet 5  . amphetamine-dextroamphetamine (ADDERALL) 20 MG tablet Take 1 tablet twice daily as needed. 60 tablet 0  . AUBAGIO 14 MG TABS TAKE 1 TABLET BY MOUTH DAILY. 28 tablet PRN  . Calcium Carb-Cholecalciferol (CALCIUM 600 + D PO) Take 1 tablet by mouth daily.    . clonazePAM (KLONOPIN) 1 MG tablet TAKE 1 TABLET BY MOUTH 3 TIMES A DAY AS NEEDED 90 tablet 2  . DULoxetine (CYMBALTA) 60 MG capsule Take 1 capsule (60 mg total) by mouth daily. 90 capsule 3  . fentaNYL (DURAGESIC) 25 MCG/HR patch Place 1 patch (25 mcg total) onto the skin every 3 (three) days. Do not fill before 10/09/2015 10 patch 0  . fesoterodine (TOVIAZ) 4 MG TB24 tablet Take 4 mg by mouth daily.    Marland Kitchen gabapentin (NEURONTIN) 600 MG tablet Take 1 tablet (600 mg total) by mouth 4 (four)  times daily. 360 tablet 3  . imipramine (TOFRANIL) 25 MG tablet Take 2 tablets at bedtime. (Patient not taking: Reported on 07/16/2015) 180 tablet 3  . Multiple Vitamin (MULTIVITAMIN) tablet Take 1 tablet by mouth daily.    Marland Kitchen oxybutynin (DITROPAN-XL) 10 MG 24 hr tablet Take 1 tablet by mouth daily. Reported on 07/16/2015    . traMADol (ULTRAM) 50 MG tablet Take 1 tablet (50 mg total) by mouth every 6 (six) hours as needed. 120 tablet 5   No facility-administered medications prior to visit.     PAST MEDICAL HISTORY: Past Medical History:  Diagnosis Date  . Anxiety   . Bulging lumbar  disc 1980  . Chronic pansinusitis   . Colon polyp   . Depression   . GERD (gastroesophageal reflux disease)   . Gynecological examination    sees Dr. Elyse Hsu   . Headache(784.0)   . Multiple sclerosis ([place]) since 1992   Dr. Arlice Colt in [facility]  . Multiple thyroid nodules 1995   per pt. resolved on its own follwed by endo then released  . Neurogenic bladder disorder 12/2009   w/ urge & stress inc. dx. Dr. McDiarmid  . Neuropathy ([place])   . Osteoarthritis   . Osteoporosis    last DEXA 08-28-07  . Overactive bladder    sees Dr. Nicki Reaper McDiarmid   . Pancreatitis 1977  . Ulcerative proctitis ([place])   . Vision abnormalities     PAST SURGICAL HISTORY: Past Surgical History:  Procedure Laterality Date  . APPENDECTOMY    . breast ruptured  left   ductal cyst  . CHOLECYSTECTOMY    . COLONOSCOPY  01/10/09   repeat in 5 yrs Dr. Wilford Corner  . DILATION AND CURETTAGE OF UTERUS    . LAPAROSCOPY     x 3  . left knee surgery  1978   to remove bone tumor    . NASAL SINUS SURGERY Bilateral 01/13/2015   Procedure: ENDOSCOPIC SINUS SURGERY;  Surgeon: Izora Gala, MD;  Location: [place];  Service: ENT;  Laterality: Bilateral;  . NASAL TURBINATE REDUCTION    . pubo vaginal sling  10/2003  . TONSILLECTOMY    . vaginal cystocele repair     anterior  . VAGINAL HYSTERECTOMY   1995   AUB & Endometriosis  . widom teeth removal      FAMILY HISTORY: Family History  Problem Relation Age of Onset  . Osteoporosis Mother     severe  . Cancer Mother 66    colon /Breast also m. niece at 52's  . Osteopenia Sister     X 2    SOCIAL HISTORY:  Social History   Social History  . Marital status: Married    Spouse name: Jori Moll  . Number of children: 1  . Years of education: 63   Occupational History  . BS Nursing Unemployed  . [employer] volunteer    Social History Main Topics  . Smoking status: Former Smoker    Packs/day: 1.00    Years: 5.00    Types: Cigarettes    Quit date: 03/09/1979  . Smokeless tobacco: Never Used  . Alcohol use No  . Drug use: No  . Sexual activity: Not Currently    Birth control/ protection: Surgical   Other Topics Concern  . Not on file   Social History Narrative  . No narrative on file     PHYSICAL EXAM  Vitals:   10/30/15 1417  BP: 122/64  Pulse: 66  Weight: 126 lb (57.2 kg)  Height: 5\' 6"  (1.676 m)    Body mass index is 20.34 kg/m.   General: The patient is well-developed and well-nourished and in no acute distress.      Musculoskeletal:  She has moderately severe tenderness over the left piriformis muscle below the SI joint on the left. She has mild tenderness at the SI joint on the left and the trochanteric bursa on the left. No Faber sign.   Tender over first Carpometacarpal joints bilateral.     Has Tinels' sign on right  Neurologic Exam  Mental status: The patient is alert and oriented x 3  at the time of the examination. The patient has apparent normal recent and remote memory, with an apparently normal attention span and concentration ability.   Speech is normal.  Cranial nerves: Extraocular movements are full.   Facial symmetry is present. There is good facial sensation to soft touch bilaterally.Facial strength is normal.  Trapezius and sternocleidomastoid strength is normal. No dysarthria is noted.    No obvious hearing deficits are noted.  Motor:  Muscle bulk is normal and tone is increased in legs. Strength is  5 / 5 in all 4 extremities.   Sensory: Sensory testing shows reduced sensation to touch/temp over right thenar eminence.   She has right Tine's sign.   She has decreased touch/temp/vib in left leg.        Coordination: Cerebellar testing reveals good finger-nose-finger and slightly reduced heel-to-shin bilaterally.  Gait and station: Station is stable and gait is mildly wide and arthritic. Tandem gait is wide. Romberg is negative.   Reflexes: Deep tendon reflexes are symmetric and increased in knees (spread) and ankles (2 beatrs clonus nonsustained).Marland Kitchen     DIAGNOSTIC DATA (LABS, IMAGING, TESTING) - I reviewed patient records, labs, notes, testing and imaging myself where available.      ASSESSMENT AND PLAN  MULTIPLE SCLEROSIS  Sciatica of left side  Ataxic gait  OVERACTIVE BLADDER  Depression with anxiety  Dysesthesia  Carpal tunnel syndrome on right     1.   continue Aubagio.   2.   Left piriformis TPI with 70 mg Depo-Medrol in [place] using sterile technique.   She tolerated the procedure well. 3.   Right carpal tunnel injection with 10 mg Depo-Medrol in 0.8 mL lidocaine. Using sterile technique. She tolerated the procedure well.  4.    Renew Adderall  IR.  Renew Fentanyl patch 4.   rtc 4 months, sooner if worse  Ilianna Bown A. Felecia Shelling, MD, PhD [id number], [id number] PM Certified in Neurology, Clinical Neurophysiology, Sleep Medicine, Pain Medicine and Neuroimaging  [facility] [address] [place], [place] [address] [phone]

## 2015-11-07 ENCOUNTER — Telehealth: Payer: Self-pay | Admitting: *Deleted

## 2015-11-07 MED ORDER — TRAMADOL HCL 50 MG PO TABS: 50.0000 mg | ORAL_TABLET | Freq: Four times a day (QID) | ORAL | 5 refills | Status: DC | PRN

## 2015-11-07 NOTE — Telephone Encounter (Signed)
[facility]ed to [facility] fax# [phone]/fim

## 2015-12-08 ENCOUNTER — Other Ambulatory Visit: Payer: Self-pay | Admitting: *Deleted

## 2015-12-08 ENCOUNTER — Telehealth: Payer: Self-pay | Admitting: Neurology

## 2015-12-08 DIAGNOSIS — G894 Chronic pain syndrome: Secondary | ICD-10-CM

## 2015-12-08 MED ORDER — FENTANYL 25 MCG/HR TD PT72: 25.0000 ug | MEDICATED_PATCH | TRANSDERMAL | 0 refills | Status: DC

## 2015-12-08 NOTE — Telephone Encounter (Signed)
Fentanyl rx. up front [facility]/fim 

## 2015-12-08 NOTE — Telephone Encounter (Signed)
Rx. awaiting RAS sig/fim 

## 2015-12-08 NOTE — Telephone Encounter (Signed)
Patient called to request refill of fentaNYL (DURAGESIC) 25 MCG/HR patch

## 2015-12-09 DIAGNOSIS — F325 Major depressive disorder, single episode, in full remission: Secondary | ICD-10-CM | POA: Diagnosis not present

## 2015-12-09 DIAGNOSIS — G35 Multiple sclerosis: Secondary | ICD-10-CM | POA: Diagnosis not present

## 2015-12-09 DIAGNOSIS — Z23 Encounter for immunization: Secondary | ICD-10-CM | POA: Diagnosis not present

## 2015-12-09 DIAGNOSIS — Z1389 Encounter for screening for other disorder: Secondary | ICD-10-CM | POA: Diagnosis not present

## 2015-12-09 DIAGNOSIS — Z0000 Encounter for general adult medical examination without abnormal findings: Secondary | ICD-10-CM | POA: Diagnosis not present

## 2015-12-10 ENCOUNTER — Other Ambulatory Visit (INDEPENDENT_AMBULATORY_CARE_PROVIDER_SITE_OTHER): Payer: Self-pay

## 2015-12-10 DIAGNOSIS — G894 Chronic pain syndrome: Secondary | ICD-10-CM | POA: Diagnosis not present

## 2015-12-10 DIAGNOSIS — Z0289 Encounter for other administrative examinations: Secondary | ICD-10-CM

## 2015-12-12 DIAGNOSIS — N10 Acute tubulo-interstitial nephritis: Secondary | ICD-10-CM | POA: Diagnosis not present

## 2015-12-12 DIAGNOSIS — R509 Fever, unspecified: Secondary | ICD-10-CM | POA: Diagnosis not present

## 2015-12-12 DIAGNOSIS — L03114 Cellulitis of left upper limb: Secondary | ICD-10-CM | POA: Diagnosis not present

## 2015-12-15 ENCOUNTER — Other Ambulatory Visit: Payer: Self-pay | Admitting: Internal Medicine

## 2015-12-15 ENCOUNTER — Ambulatory Visit
Admission: RE | Admit: 2015-12-15 | Discharge: 2015-12-15 | Disposition: A | Discharge status: Home / Self Care | Payer: PPO | Source: Ambulatory Visit | Attending: Internal Medicine | Admitting: Internal Medicine

## 2015-12-15 DIAGNOSIS — J320 Chronic maxillary sinusitis: Secondary | ICD-10-CM

## 2015-12-15 DIAGNOSIS — R05 Cough: Secondary | ICD-10-CM | POA: Diagnosis not present

## 2015-12-15 DIAGNOSIS — R5081 Fever presenting with conditions classified elsewhere: Secondary | ICD-10-CM | POA: Diagnosis not present

## 2015-12-15 DIAGNOSIS — D72825 Bandemia: Secondary | ICD-10-CM | POA: Diagnosis not present

## 2015-12-15 IMAGING — CR DG CHEST 2V
2 series · 2 of 2 positions shown · non-contrast
Comparison: None impacts

CLINICAL DATA: Cough, chest congestion, fever. , former smoker,
history of chronic ethmoid sinusitis.

EXAM:
CHEST  2 VIEW

[view not recorded (1 of 2)]
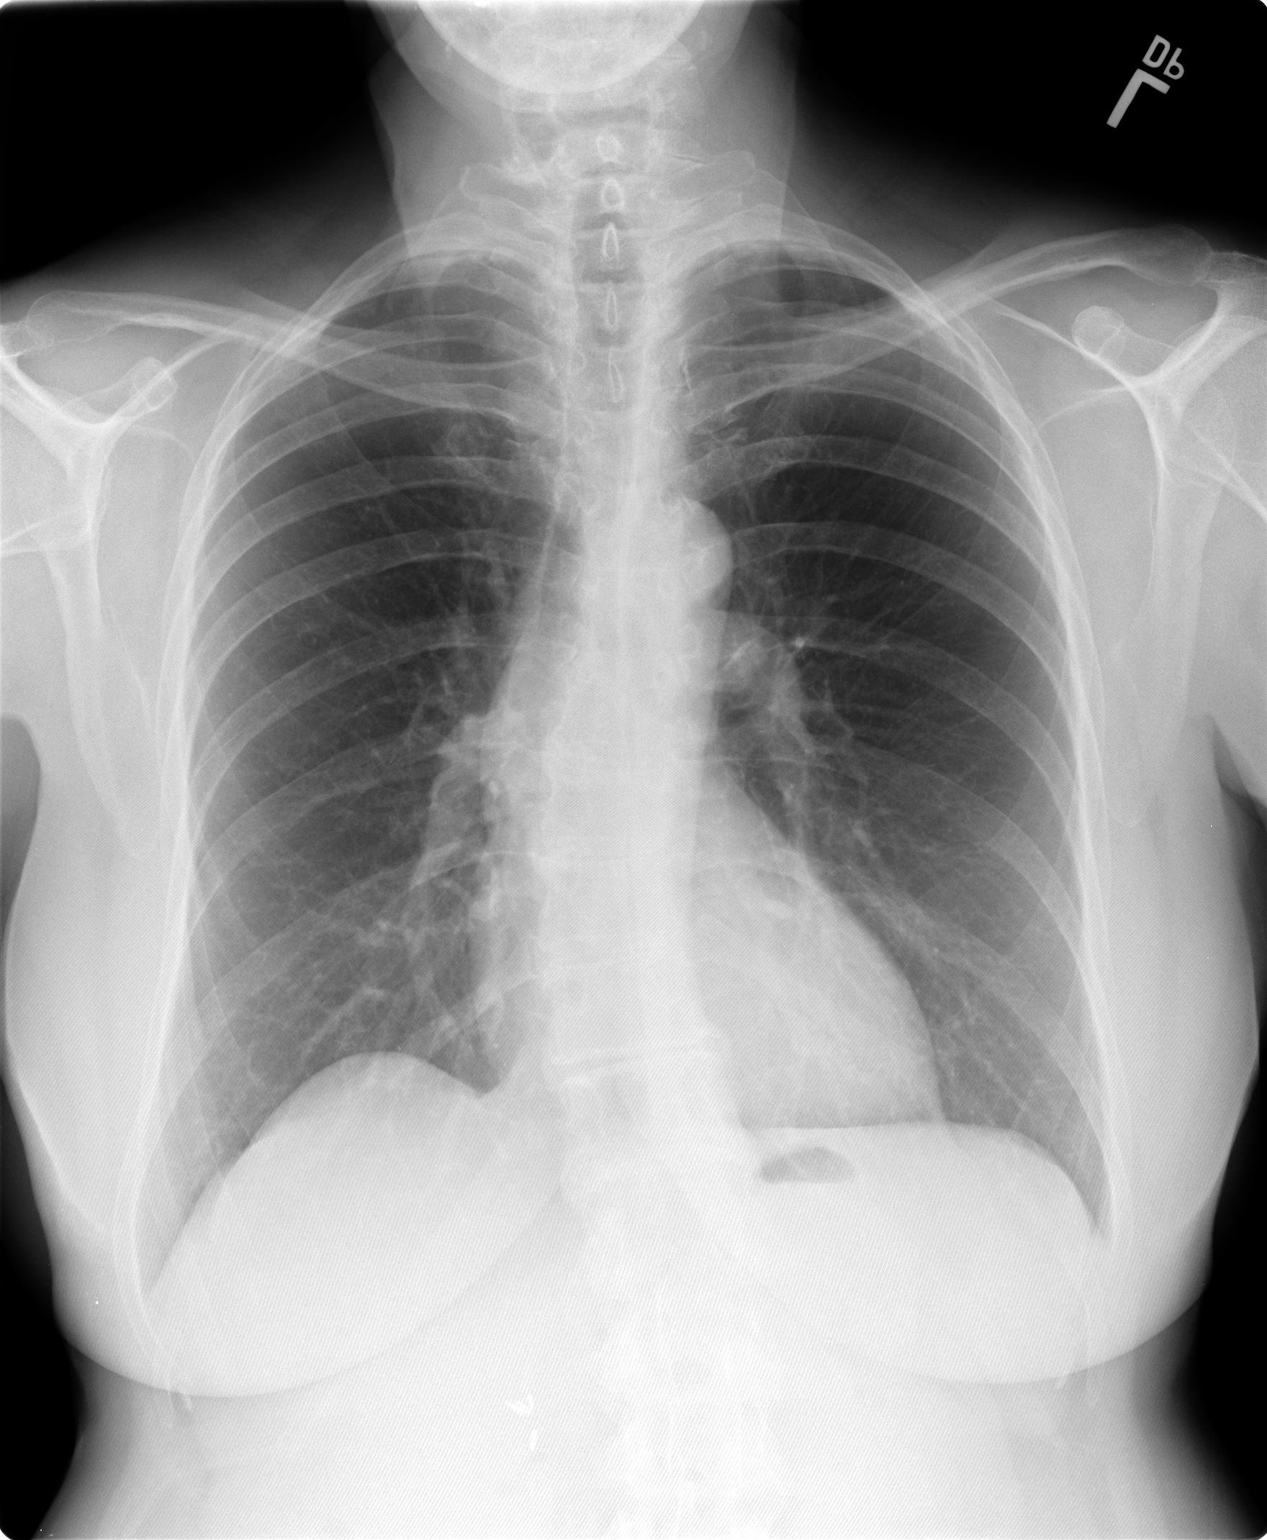

[view not recorded (2 of 2)]
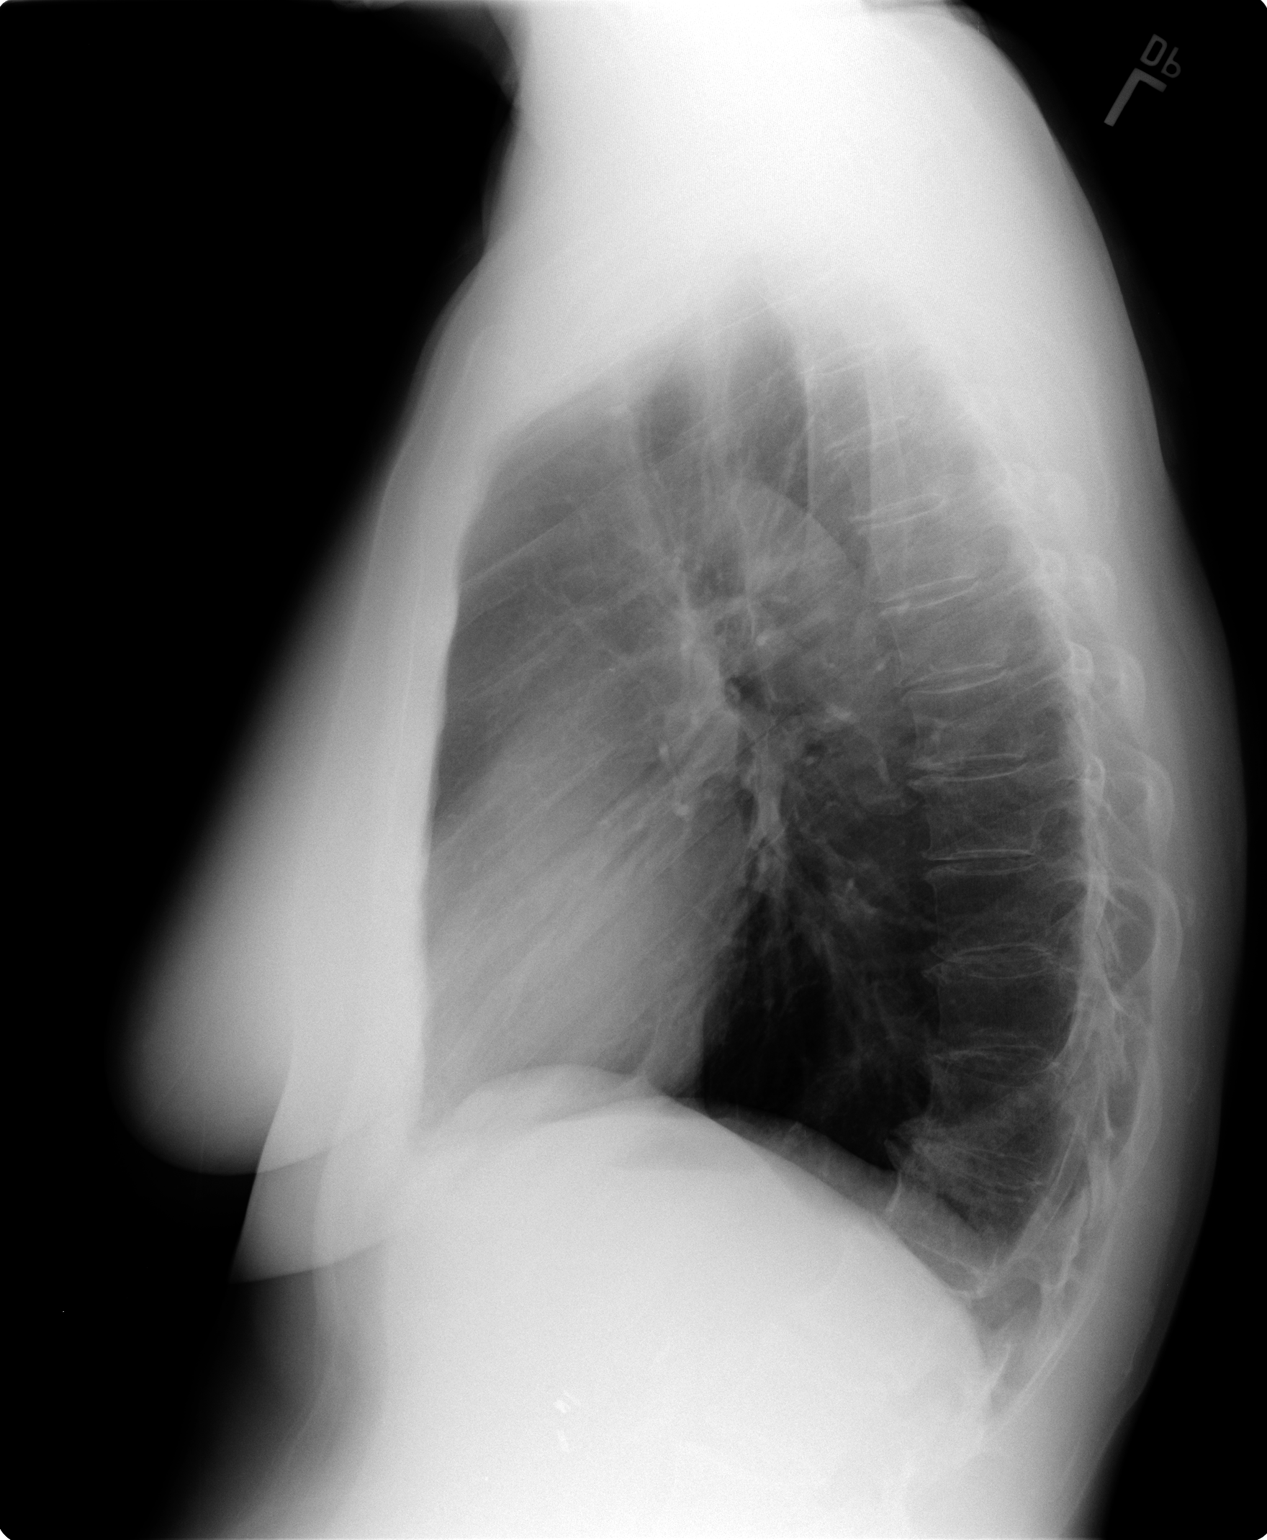

[2 of 2 positions shown; findings below may reference images not displayed]

FINDINGS: The lungs are well-expanded and clear. The heart and pulmonary
vascularity are normal. The mediastinum is normal in width. There is
no pleural effusion. There is calcification in the wall of the
thoracic aorta. There is moderate dextrocurvature centered in the
mid to lower thoracic spine.
IMPRESSION: Mild hyperinflation may be voluntary or may reflect air trapping
secondary to bronchitis. There is no evidence of pneumonia nor CHF.

Aortic atherosclerosis.

## 2015-12-16 ENCOUNTER — Other Ambulatory Visit: Payer: Self-pay | Admitting: Internal Medicine

## 2015-12-16 ENCOUNTER — Ambulatory Visit
Admission: RE | Admit: 2015-12-16 | Discharge: 2015-12-16 | Disposition: A | Discharge status: Home / Self Care | Payer: PPO | Source: Ambulatory Visit | Attending: Internal Medicine | Admitting: Internal Medicine

## 2015-12-16 DIAGNOSIS — R509 Fever, unspecified: Secondary | ICD-10-CM | POA: Diagnosis not present

## 2015-12-16 DIAGNOSIS — R51 Headache: Secondary | ICD-10-CM | POA: Diagnosis not present

## 2015-12-16 DIAGNOSIS — J329 Chronic sinusitis, unspecified: Secondary | ICD-10-CM | POA: Diagnosis not present

## 2015-12-16 DIAGNOSIS — J320 Chronic maxillary sinusitis: Secondary | ICD-10-CM

## 2015-12-16 LAB — DRUG SCREEN, UR (12+OXYCODONE+CRT)
AMPHETAMINE SCREEN URINE: POSITIVE — AB
BARBITURATE SCREEN URINE: NEGATIVE ng/mL
BENZODIAZEPINE SCREEN, URINE: POSITIVE ng/mL — AB
CANNABINOIDS UR QL SCN: NEGATIVE ng/mL
Cocaine (Metab) Scrn, Ur: NEGATIVE ng/mL
Creatinine(Crt), U: 84.4 mg/dL (ref 20.0–300.0)
Fentanyl, Urine: POSITIVE — AB
Meperidine Screen, Urine: NEGATIVE ng/mL
Methadone Screen, Urine: NEGATIVE ng/mL
OXYCODONE+OXYMORPHONE UR QL SCN: NEGATIVE ng/mL
Opiate Scrn, Ur: NEGATIVE ng/mL
PH UR, DRUG SCRN: 7.2 (ref 4.5–8.9)
PHENCYCLIDINE QUANTITATIVE URINE: NEGATIVE ng/mL
Propoxyphene Scrn, Ur: NEGATIVE ng/mL
SPECIFIC GRAVITY: 1.013
TRAMADOL SCREEN, URINE: POSITIVE — AB

## 2015-12-16 IMAGING — CT CT MAXILLOFACIAL W/O CM
1 series · 15 of 30 positions shown, 19 images · non-contrast
Comparison: None.

CLINICAL DATA: Left maxillary pain, light sensitivity and frontal
headaches

EXAM:
CT MAXILLOFACIAL WITHOUT CONTRAST
TECHNIQUE: Multidetector CT imaging of the maxillofacial structures was
performed. Multiplanar CT image reconstructions were also generated.
A small metallic BB was placed on the right temple in order to
reliably differentiate right from left.

[Series 4: maxofacial soft · axial · 0.29mm/px · z∈[-120,+108]mm · 15 of 82 slices shown, 19 images]
[im 3/82  brain]
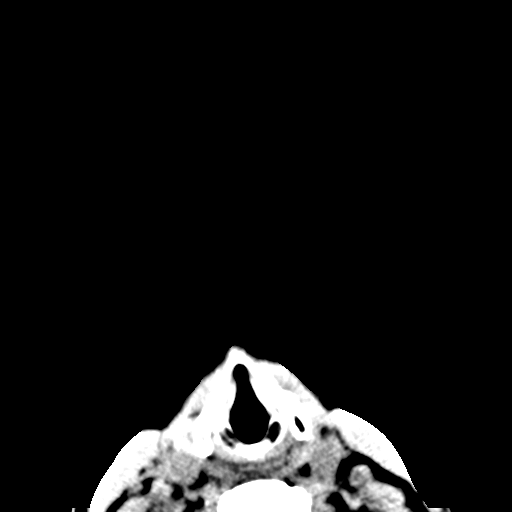
[im 3/82  bone]
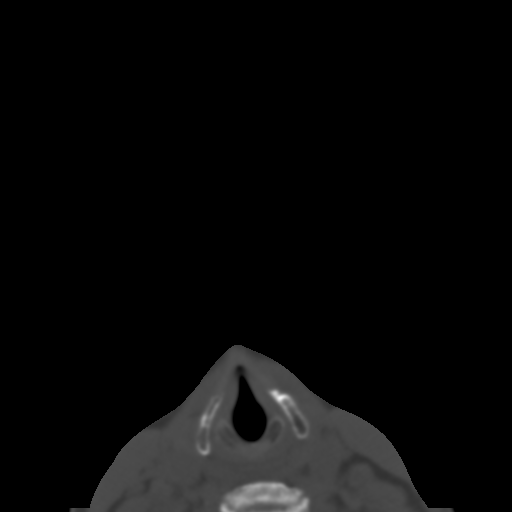
[im 9/82  bone]
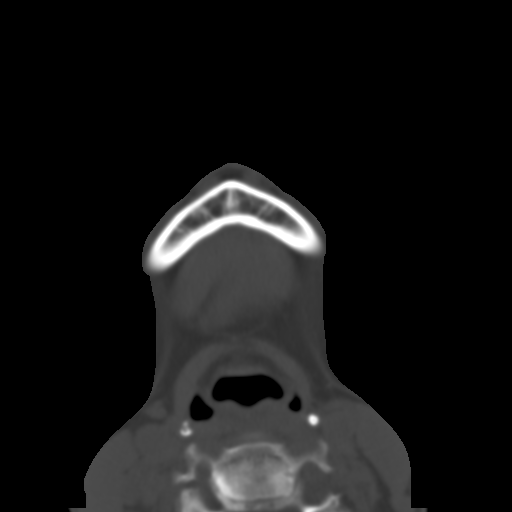
[im 14/82  bone]
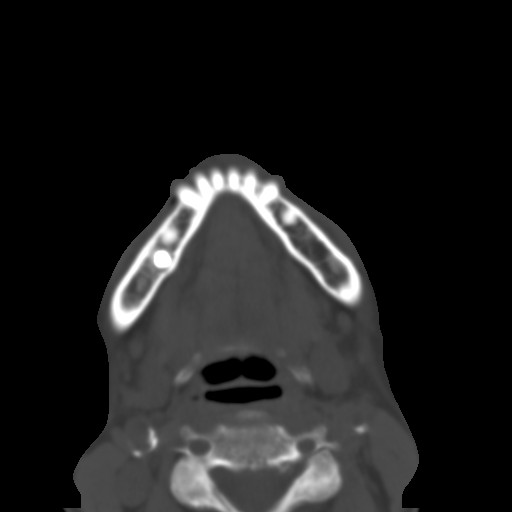
[im 20/82  bone]
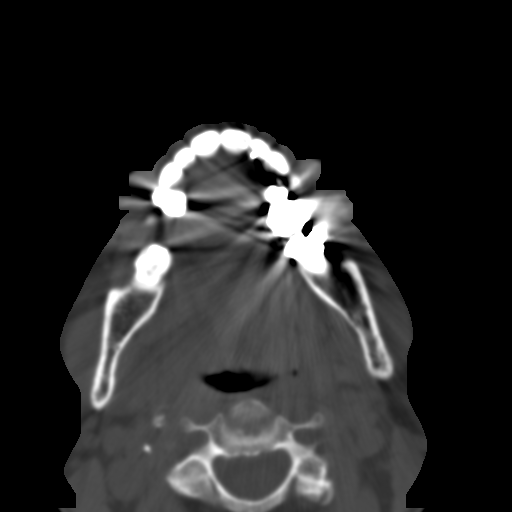
[im 26/82  brain]
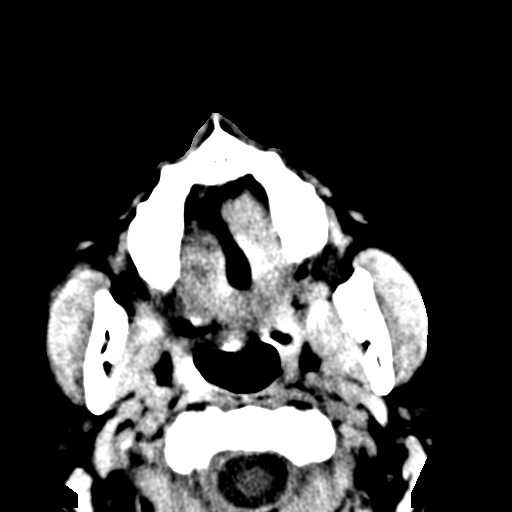
[im 26/82  bone]
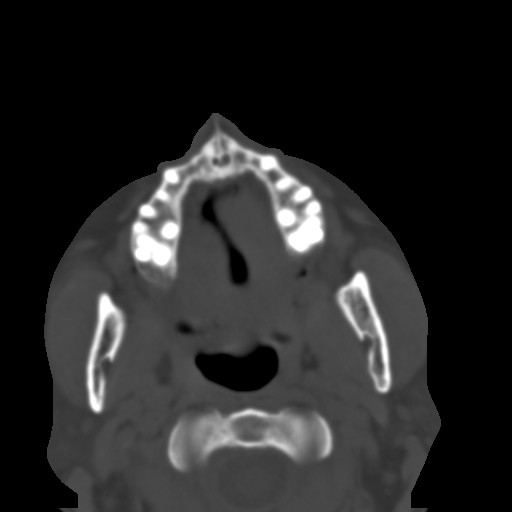
[im 31/82  bone]
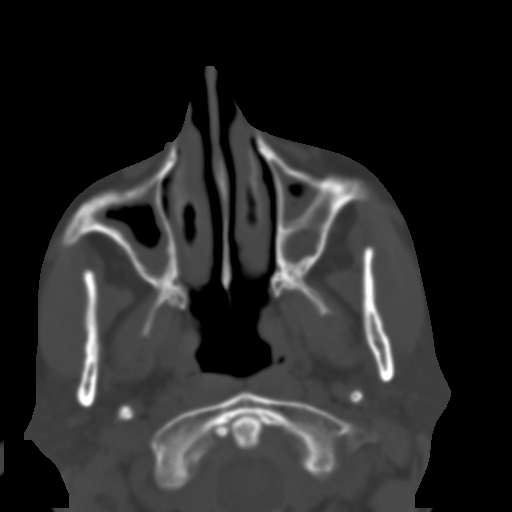
[im 37/82  bone]
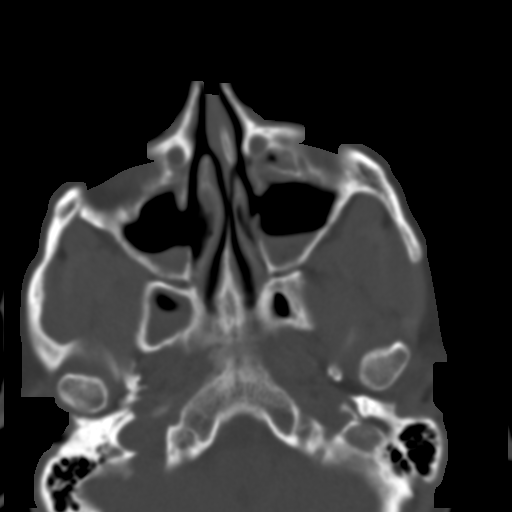
[im 42/82  bone]
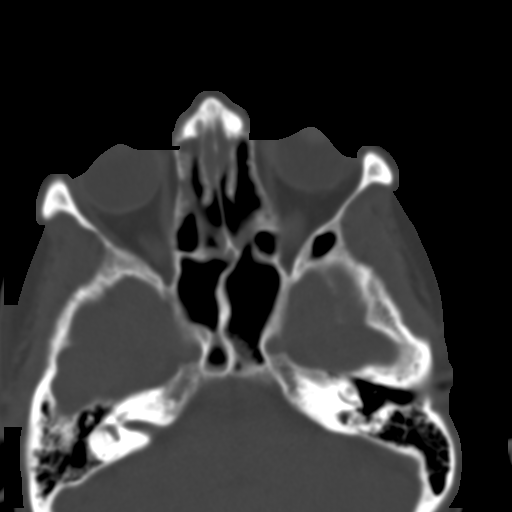
[im 45/82  brain]
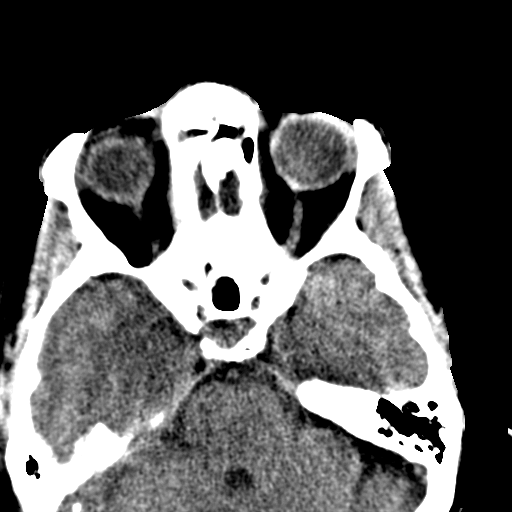
[im 45/82  bone]
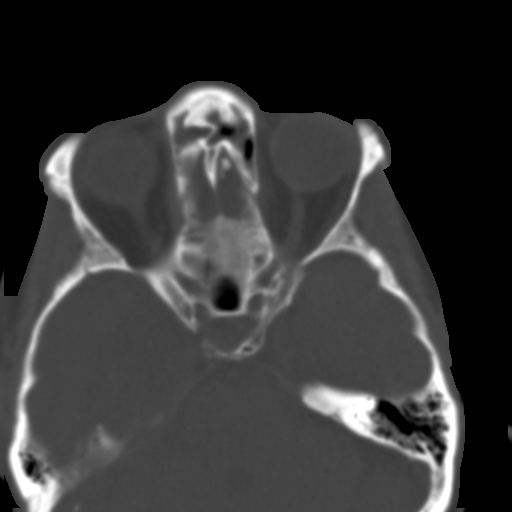
[im 51/82  bone]
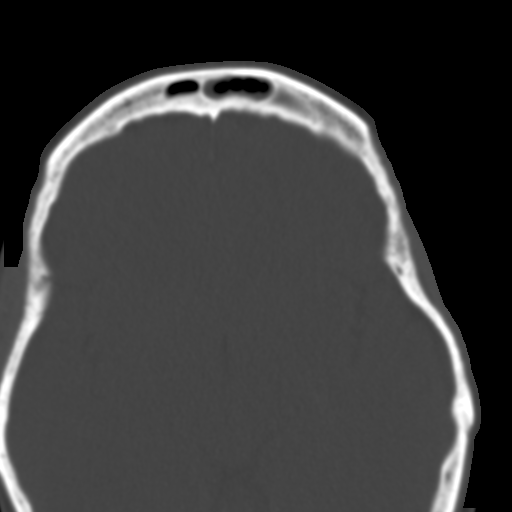
[im 56/82  bone]
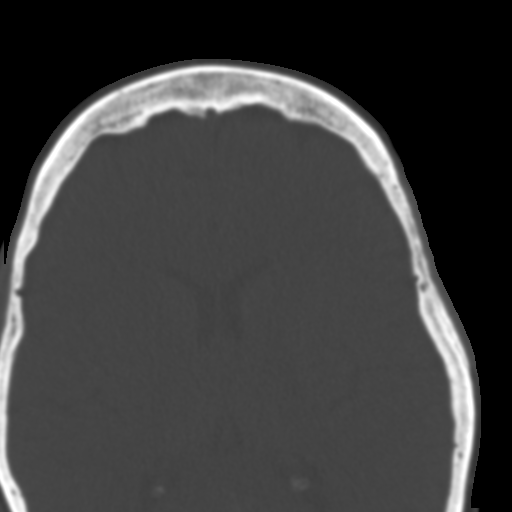
[im 62/82  bone]
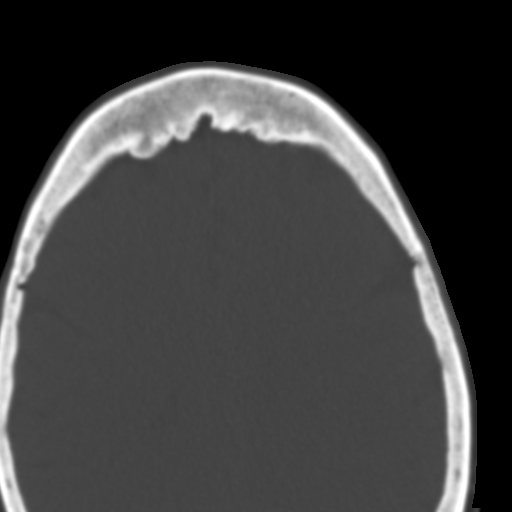
[im 68/82  brain]
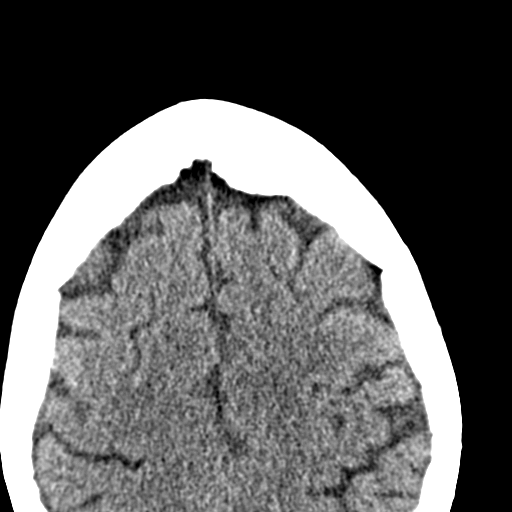
[im 68/82  bone]
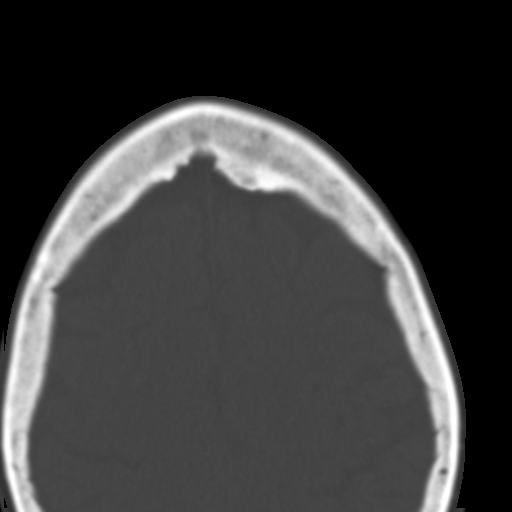
[im 73/82  bone]
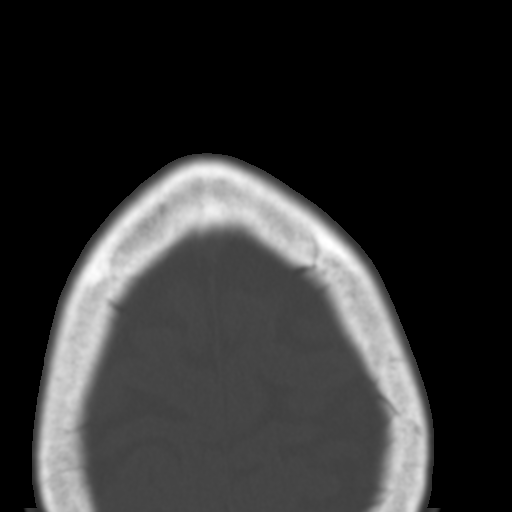
[im 79/82  bone]
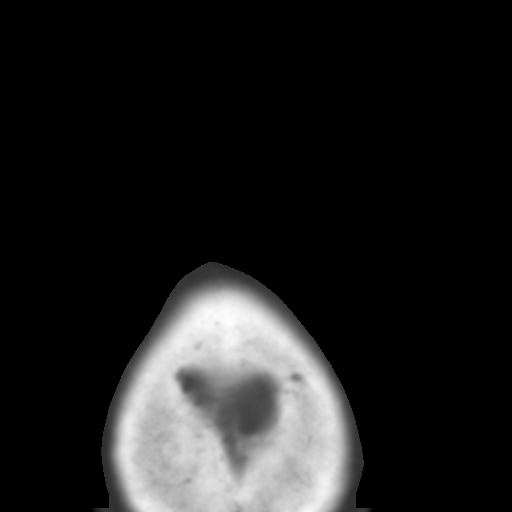

[15 of 30 positions shown; findings below may reference images not displayed]

FINDINGS: Sinuses

--Frontal: Near-total opacification with high density secretions.
Both frontal recesses are occluded.

--Ethmoid: Postsurgical changes of prior ethmoidectomy with partial
opacification secondary to moderate mucosal thickening.

--Sphenoid: Partial opacification with frothy secretions
bilaterally, right greater than left. The right sphenoid ostium is
occluded. The left sphenoid ethmoidal recess and ostium are patent

--Maxillary: Status post bilateral maxillary antrostomies, which are
patent. There is bilateral maxillary mucosal thickening with
layering secretions, greater on the left.

Nasal cavity: 4 mm of leftward septal deviation.

Nasopharynx: Clear.

Mastoid air cells: No effusion.

Orbits: Normal.

Skull base and visualized extracranial soft tissues: Normal.

Mandible and temporomandibular joints: Normal.

Visualized intracranial: Normal.
IMPRESSION: 1. Postsurgical changes of prior functional endoscopic sinus
surgery.
2. Partial opacification of all paranasal sinuses, worst at
bilateral frontal and left maxillary sinuses.
3. Conclusion of both frontal recesses and the right sphenoid
ostium. Bilateral maxillary antrostomies are patent.

## 2015-12-17 DIAGNOSIS — J321 Chronic frontal sinusitis: Secondary | ICD-10-CM | POA: Diagnosis not present

## 2015-12-18 ENCOUNTER — Telehealth: Payer: Self-pay | Admitting: Neurology

## 2015-12-18 DIAGNOSIS — J0110 Acute frontal sinusitis, unspecified: Secondary | ICD-10-CM | POA: Diagnosis not present

## 2015-12-18 DIAGNOSIS — R51 Headache: Secondary | ICD-10-CM | POA: Diagnosis not present

## 2015-12-18 NOTE — Telephone Encounter (Signed)
I have spoken with Sheri Burns this afternoon and per RAS, advised that she may continue either the Xanax or Clonazepam, but should not continue both.  Sts. she gets the most benefit from Clonazepam, so would like to continue it.  She has already picked Xanax rx. up this week--will take it this month, and will call in 4 weeks when it is time for Clonazepam r/f.  At that time, I will also take Xanax off of her med list/fim

## 2015-12-18 NOTE — Telephone Encounter (Signed)
Patient called requesting to speak to Faith regarding, "how to taper off and stop fentaNYL (DURAGESIC) 25 MCG/HR patch, has 7 left". Also states [facility] to advise [facility]ill request for clonazePAM (KLONOPIN) 1 MG tablet was denied and wants to know why? Please call [phone] to advise.

## 2015-12-18 NOTE — Telephone Encounter (Signed)
I have also explained, per RAS, that Fentanyl is a low dose, she can just stop it.  She sts. she went several hrs. without a fentanyl patch (it fell off; poor adhesive) and she had increased shakiness during this time.  She will place wax paper under 1/2 of the patch for several days, to decrease amt. of med absorbed, then will stop it/fim

## 2015-12-19 MED ORDER — CLONAZEPAM 1 MG PO TABS: 1.0000 mg | ORAL_TABLET | Freq: Three times a day (TID) | ORAL | 2 refills | Status: DC | PRN

## 2015-12-19 NOTE — Telephone Encounter (Signed)
I have spoken with Sheri Burns this morning.  She would like to discuss if she should stop Xanax vs Clonazepam.  She has been taking Xanax for anxiety and Clonazepam for leg spasms/spasticity.  Per RAS, Clonazepam will last longer and help both the spasms/spasticity and anxiety.  Sheri Burns verbalized understanding of same.  She will taper off of Xanax.  Currently she takes one po bid.  she will take 1/2 po bid for a week, then decrease to 1/2 once daily for a week, then stop.  [facility]/fim

## 2015-12-19 NOTE — Addendum Note (Signed)
Addended by: France Ravens I on: 12/19/2015 11:38 AM   Modules accepted: Orders

## 2015-12-19 NOTE — Telephone Encounter (Signed)
Patient called requesting to speak to Faith about discontinuing ALPRAZolam (XANAX) 0.5 MG tablet. Please call [phone].

## 2015-12-24 DIAGNOSIS — J0110 Acute frontal sinusitis, unspecified: Secondary | ICD-10-CM | POA: Diagnosis not present

## 2015-12-30 DIAGNOSIS — J321 Chronic frontal sinusitis: Secondary | ICD-10-CM | POA: Diagnosis not present

## 2016-01-02 DIAGNOSIS — M1712 Unilateral primary osteoarthritis, left knee: Secondary | ICD-10-CM | POA: Diagnosis not present

## 2016-01-06 ENCOUNTER — Telehealth: Payer: Self-pay | Admitting: Neurology

## 2016-01-06 MED ORDER — FENTANYL 25 MCG/HR TD PT72: 25.0000 ug | MEDICATED_PATCH | TRANSDERMAL | 0 refills | Status: DC

## 2016-01-06 NOTE — Telephone Encounter (Signed)
Fentanyl rx. up front [facility]/fim 

## 2016-01-06 NOTE — Telephone Encounter (Signed)
Patient requesting refill of fentaNYL (DURAGESIC) 25 MCG/HR patch  Pharmacy: pick up

## 2016-01-06 NOTE — Telephone Encounter (Signed)
Rx. awaiting RAS sig/fim 

## 2016-01-09 DIAGNOSIS — N319 Neuromuscular dysfunction of bladder, unspecified: Secondary | ICD-10-CM | POA: Diagnosis not present

## 2016-01-09 DIAGNOSIS — N3946 Mixed incontinence: Secondary | ICD-10-CM | POA: Diagnosis not present

## 2016-01-26 ENCOUNTER — Telehealth: Payer: Self-pay | Admitting: Neurology

## 2016-01-26 MED ORDER — AMPHETAMINE-DEXTROAMPHETAMINE 20 MG PO TABS: ORAL_TABLET | ORAL | 0 refills | Status: DC

## 2016-01-26 NOTE — Telephone Encounter (Signed)
Patient requesting refill of amphetamine-dextroamphetamine (ADDERALL) 20 MG tablet Pharmacy: pickup

## 2016-01-26 NOTE — Telephone Encounter (Signed)
Rx. awaiting RAS sig/fim 

## 2016-01-27 NOTE — Telephone Encounter (Signed)
Adderall rx. up front [facility]/fim 

## 2016-02-02 ENCOUNTER — Telehealth: Payer: Self-pay | Admitting: Neurology

## 2016-02-02 MED ORDER — FENTANYL 25 MCG/HR TD PT72: 25.0000 ug | MEDICATED_PATCH | TRANSDERMAL | 0 refills | Status: DC

## 2016-02-02 NOTE — Telephone Encounter (Signed)
Rx. up front [facility]/fim 

## 2016-02-02 NOTE — Telephone Encounter (Signed)
Rx. awaiting RAS sig/fm

## 2016-02-02 NOTE — Telephone Encounter (Signed)
Patient requesting refill of fentaNYL (DURAGESIC) 25 MCG/HR patch. I advised the Rx will be ready in 24 hours unless the nurse advises otherwise.

## 2016-02-02 NOTE — Addendum Note (Signed)
Addended by: France Ravens I on: 02/02/2016 08:54 AM   Modules accepted: Orders

## 2016-02-10 ENCOUNTER — Telehealth: Payer: Self-pay | Admitting: Neurology

## 2016-02-10 NOTE — Telephone Encounter (Signed)
[facility] called says pt is requesting early refill for fentaNYL (DURAGESIC) 25 MCG/HR patch .she said pt last got it refilled 01/14/16, she can fill it 01/12/16 but pt said she is going out of town and can not wait until tomorrow.

## 2016-02-10 NOTE — Telephone Encounter (Signed)
I have spoken with Sheri Burns and advised ok for pt. to take rx with her and have it filled tomorrow; this way it can still be filled when due/fim

## 2016-02-13 DIAGNOSIS — M1712 Unilateral primary osteoarthritis, left knee: Secondary | ICD-10-CM | POA: Diagnosis not present

## 2016-02-19 DIAGNOSIS — M1712 Unilateral primary osteoarthritis, left knee: Secondary | ICD-10-CM | POA: Diagnosis not present

## 2016-02-27 DIAGNOSIS — M1712 Unilateral primary osteoarthritis, left knee: Secondary | ICD-10-CM | POA: Diagnosis not present

## 2016-03-10 ENCOUNTER — Ambulatory Visit: Payer: PPO | Admitting: Diagnostic Neuroimaging

## 2016-03-10 ENCOUNTER — Other Ambulatory Visit: Payer: Self-pay | Admitting: Neurology

## 2016-03-10 MED ORDER — FENTANYL 25 MCG/HR TD PT72: 25.0000 ug | MEDICATED_PATCH | TRANSDERMAL | 0 refills | Status: DC

## 2016-03-10 NOTE — Telephone Encounter (Signed)
Pt request refill for fentaNYL (DURAGESIC) 25 MCG/HR patch ° ° °

## 2016-03-13 ENCOUNTER — Other Ambulatory Visit: Payer: Self-pay | Admitting: Neurology

## 2016-03-29 ENCOUNTER — Telehealth: Payer: Self-pay | Admitting: Neurology

## 2016-03-29 MED ORDER — AMPHETAMINE-DEXTROAMPHETAMINE 20 MG PO TABS: ORAL_TABLET | ORAL | 0 refills | Status: DC

## 2016-03-29 NOTE — Telephone Encounter (Signed)
Adderall rx. up front [facility]/fim 

## 2016-03-29 NOTE — Telephone Encounter (Signed)
Patient requesting refill for amphetamine-dextroamphetamine (ADDERALL) 20 MG tablet.   DULoxetine (CYMBALTA) 60 MG capsule. [facility] mail   gabapentin (NEURONTIN) 600 MG tablet. Pharmacy  [facility]

## 2016-03-29 NOTE — Addendum Note (Signed)
Addended by: France Ravens I on: 03/29/2016 03:48 PM   Modules accepted: Orders

## 2016-03-29 NOTE — Telephone Encounter (Signed)
Adderall rx. awaiting RAS sig.  Cymbalta and Gabapentin have been escribed to [facility]

## 2016-04-01 ENCOUNTER — Telehealth: Payer: Self-pay | Admitting: Neurology

## 2016-04-01 MED ORDER — DULOXETINE HCL 60 MG PO CPEP: 60.0000 mg | ORAL_CAPSULE | Freq: Every day | ORAL | 3 refills | Status: DC

## 2016-04-01 MED ORDER — GABAPENTIN 600 MG PO TABS: 600.0000 mg | ORAL_TABLET | Freq: Four times a day (QID) | ORAL | 3 refills | Status: DC

## 2016-04-01 MED ORDER — FENTANYL 25 MCG/HR TD PT72: 25.0000 ug | MEDICATED_PATCH | TRANSDERMAL | 0 refills | Status: DC

## 2016-04-01 NOTE — Telephone Encounter (Signed)
error 

## 2016-04-01 NOTE — Telephone Encounter (Signed)
[facility]ymbalta and [facility] per pt's request.  Fentanyl rx. awaiting RAS sig/fim

## 2016-04-01 NOTE — Telephone Encounter (Signed)
Pt called request [facility]ymbalta and [facility]. She is also wanting refill for fentaNYL (DURAGESIC) 25 MCG/HR patch . Pt is going out of town 2/8 for grandson's surgery and wants to take med with her. She has 4 patches left.

## 2016-04-01 NOTE — Addendum Note (Signed)
Addended by: France Ravens I on: 04/01/2016 11:07 AM   Modules accepted: Orders

## 2016-04-05 ENCOUNTER — Ambulatory Visit: Payer: PPO | Admitting: Neurology

## 2016-04-05 NOTE — Telephone Encounter (Signed)
Fentanyl rx. up front [facility]/fim 

## 2016-04-07 ENCOUNTER — Ambulatory Visit: Payer: PPO | Admitting: Neurology

## 2016-04-09 ENCOUNTER — Encounter: Payer: Self-pay | Admitting: Neurology

## 2016-04-09 ENCOUNTER — Ambulatory Visit (INDEPENDENT_AMBULATORY_CARE_PROVIDER_SITE_OTHER): Payer: PPO | Admitting: Neurology

## 2016-04-09 VITALS — BP 131/74 | HR 89 | Resp 16 | Ht 66.0 in | Wt 135.0 lb

## 2016-04-09 DIAGNOSIS — R208 Other disturbances of skin sensation: Secondary | ICD-10-CM

## 2016-04-09 DIAGNOSIS — G35 Multiple sclerosis: Secondary | ICD-10-CM

## 2016-04-09 DIAGNOSIS — Z79899 Other long term (current) drug therapy: Secondary | ICD-10-CM

## 2016-04-09 DIAGNOSIS — E559 Vitamin D deficiency, unspecified: Secondary | ICD-10-CM | POA: Diagnosis not present

## 2016-04-09 DIAGNOSIS — R260 Ataxic gait: Secondary | ICD-10-CM | POA: Diagnosis not present

## 2016-04-09 DIAGNOSIS — R5382 Chronic fatigue, unspecified: Secondary | ICD-10-CM | POA: Diagnosis not present

## 2016-04-09 MED ORDER — FENTANYL 25 MCG/HR TD PT72: 25.0000 ug | MEDICATED_PATCH | TRANSDERMAL | 0 refills | Status: DC

## 2016-04-09 MED ORDER — CYCLOBENZAPRINE HCL 5 MG PO TABS: 5.0000 mg | ORAL_TABLET | Freq: Every day | ORAL | 5 refills | Status: DC

## 2016-04-09 MED ORDER — AMPHETAMINE-DEXTROAMPHETAMINE 20 MG PO TABS: ORAL_TABLET | ORAL | 0 refills | Status: DC

## 2016-04-09 NOTE — Progress Notes (Signed)
[facility]  PATIENT: Sheri Burns DOB: Dec 21, 1955  REFERRING CLINICIAN: Thressa Sheller  HISTORY FROM: Patient REASON FOR VISIT: MS   HISTORICAL  CHIEF COMPLAINT:  Chief Complaint  Patient presents with  . Multiple Sclerosis    Sts. has been off of Aubagio for about 6 weeks, due to no copay assistance.  Denies new or worsening sx. since stopping med/fim    HISTORY OF PRESENT ILLNESS:  Sheri Burns is a 61 year old woman with MS.     MS:   She has been off Aubagio x 2 months due to insurance having trouble helping her to cover her copay.  She tolerates it well.   She has not had any MS exacerbations.   Pain:   Her knee pain is much better since seeing ortho and having an injection.    Gait/strength/sensation:    She feels gait is about the same with occasional stumbling but no recent fall.  She has left leg and right foot pain/dysesthesia (burning).  She gets some positional tingling in her arms.  She takes gabapentin 600 mg po qid and is not sure she gets much benefit.  Leg pain increases with standing and worsens as the day goes on.  She has mild weakness and stiffness in her legs  Lamotrigine and imipramine did not help the dysesthetic pain.    Dysphagia:  This is stable over the past year.    Endoscopy has been performed in the past.   Her mouth is dry and dryness is better without imipramine.   Food sometimes gets stuck and she occasionally coughs after eating.     Bladder:   Her bladder is better on oxybutynin .   She has no recent incontinence.     Fatigue/Sleep:   She has fatigue, worse as the day goes on.  She is often feels drained in mid-afternoon.    Adderall helps some (20 in am and 10 at noon).   She has been sleeping worse off xanax waking up more at night.     She wakes up with pain at times  Cognitive:   She notes difficulty coming up with the right words and STM.  Her focus and attention are poor.  Sometimes hr thought processes seem  slow.   Cognition, fatigue and attention are all better with Adderall.  Mood:  Mood is doing ok but she still notes some depression .    She is on  Cymbalta with benefit.  She rhas stress with husband's issues (she feels he constantly talks about his health issues and since he retired he is home 24/7).    She is on clonazepam for both anxiety and spasms.     MS History:  She was diagnosed with multiple sclerosis more than 20 years ago. Initially, she was treated with Betaseron then Copaxone but has not been on any disease modifying therapy for about 8 years. During this time, she has had occasional  Exacerbations with more fatigue and gait issues.  She also has noted mild cognitive dysfunction worsening over the past 5 or 6 years. Her MRI of the brain performed 08/31/2012 shows foci that are predominantly periventricular. They are consistent with the diagnosis of multiple sclerosis. When this MRI was compared to an MRI dated 10/12/2010, there was one additional small periventricular focus. There were no acute findings on either MRI.   In November 2015, she had an exacerbation with severe fatigue, worsened gait and dysphagia.    She started [employer]  09/09/14.        REVIEW OF SYSTEMS:  Constitutional: No fevers, chills, sweats, or change in appetite.  She notes a lot of fatigue. She is sleeping well. Eyes: No visual changes, double vision, eye pain Ear, nose and throat: No hearing loss, ear pain, nasal congestion, sore throat Cardiovascular: No chest pain, palpitations Respiratory:  No shortness of breath at rest or with exertion.   No wheezes GastrointestinaI: No nausea, vomiting, diarrhea.  Has constipation Genitourinary:  see above. Musculoskeletal:  reports pain in knees.   Reports neck pain and back pain Integumentary: No rash, pruritus, skin lesions Neurological: as above Psychiatric: Depression and  Anxiety noted Endocrine: No palpitations, diaphoresis, change in appetite, change in weigh or  increased thirst Hematologic/Lymphatic:  No anemia, purpura, petechiae. Allergic/Immunologic: No itchy/runny eyes, nasal congestion, recent allergic reactions, rashes  ALLERGIES: Allergies  Allergen Reactions  . Codeine Nausea And Vomiting    vomiting  . Demerol [Meperidine] Nausea And Vomiting  . Penicillins   . Stadol [Butorphanol] Other (See Comments)    Per patient psychotic event  . Sulfonamide Derivatives Hives    HOME MEDICATIONS: Outpatient Medications Prior to Visit  Medication Sig Dispense Refill  . acetaminophen (TYLENOL) 500 MG tablet Take 500 mg by mouth 4 (four) times daily.    Marland Kitchen amphetamine-dextroamphetamine (ADDERALL) 20 MG tablet Take 1 tablet twice daily as needed. 60 tablet 0  . AUBAGIO 14 MG TABS TAKE 1 TABLET BY MOUTH DAILY. 28 tablet PRN  . Calcium Carb-Cholecalciferol (CALCIUM 600 + D PO) Take 1 tablet by mouth daily.    . clonazePAM (KLONOPIN) 1 MG tablet TAKE 1 TABLET BY MOUTH 3 TIMES A DAY AS NEEDED 90 tablet 2  . DULoxetine (CYMBALTA) 60 MG capsule Take 1 capsule (60 mg total) by mouth daily. 90 capsule 3  . fentaNYL (DURAGESIC) 25 MCG/HR patch Place 1 patch (25 mcg total) onto the skin every 3 (three) days. 10 patch 0  . fesoterodine (TOVIAZ) 4 MG TB24 tablet Take 4 mg by mouth daily.    Marland Kitchen gabapentin (NEURONTIN) 600 MG tablet Take 1 tablet (600 mg total) by mouth 4 (four) times daily. 360 tablet 3  . imipramine (TOFRANIL) 25 MG tablet Take 2 tablets at bedtime. 180 tablet 3  . Multiple Vitamin (MULTIVITAMIN) tablet Take 1 tablet by mouth daily.    Marland Kitchen oxybutynin (DITROPAN-XL) 10 MG 24 hr tablet Take 1 tablet by mouth daily. Reported on 07/16/2015    . traMADol (ULTRAM) 50 MG tablet Take 1 tablet (50 mg total) by mouth every 6 (six) hours as needed. 120 tablet 5   No facility-administered medications prior to visit.     PAST MEDICAL HISTORY: Past Medical History:  Diagnosis Date  . Anxiety   . Bulging lumbar disc 1980  . Chronic pansinusitis   . Colon  polyp   . Depression   . GERD (gastroesophageal reflux disease)   . Gynecological examination    sees Dr. Elyse Hsu   . Headache(784.0)   . Multiple sclerosis ([place]) since 1992   Dr. Arlice Colt in [facility]  . Multiple thyroid nodules 1995   per pt. resolved on its own follwed by endo then released  . Neurogenic bladder disorder 12/2009   w/ urge & stress inc. dx. Dr. McDiarmid  . Neuropathy ([place])   . Osteoarthritis   . Osteoporosis    last DEXA 08-28-07  . Overactive bladder    sees Dr. Nicki Reaper McDiarmid   . Pancreatitis 1977  .  Ulcerative proctitis ([place])   . Vision abnormalities     PAST SURGICAL HISTORY: Past Surgical History:  Procedure Laterality Date  . APPENDECTOMY    . breast ruptured  left   ductal cyst  . CHOLECYSTECTOMY    . COLONOSCOPY  01/10/09   repeat in 5 yrs Dr. Wilford Corner  . DILATION AND CURETTAGE OF UTERUS    . LAPAROSCOPY     x 3  . left knee surgery  1978   to remove bone tumor    . NASAL SINUS SURGERY Bilateral 01/13/2015   Procedure: ENDOSCOPIC SINUS SURGERY;  Surgeon: Izora Gala, MD;  Location: [facility];  Service: ENT;  Laterality: Bilateral;  . NASAL TURBINATE REDUCTION    . pubo vaginal sling  10/2003  . TONSILLECTOMY    . vaginal cystocele repair     anterior  . VAGINAL HYSTERECTOMY  1995   AUB & Endometriosis  . widom teeth removal      FAMILY HISTORY: Family History  Problem Relation Age of Onset  . Osteoporosis Mother     severe  . Cancer Mother 19    colon /Breast also m. niece at 71's  . Osteopenia Sister     X 2    SOCIAL HISTORY:  Social History   Social History  . Marital status: Married    Spouse name: Jori Moll  . Number of children: 1  . Years of education: 70   Occupational History  . BS Nursing Unemployed  . [employer] volunteer    Social History Main Topics  . Smoking status: Former Smoker    Packs/day: 1.00    Years: 5.00    Types: Cigarettes    Quit date: 03/09/1979  .  Smokeless tobacco: Never Used  . Alcohol use No  . Drug use: No  . Sexual activity: Not Currently    Birth control/ protection: Surgical   Other Topics Concern  . Not on file   Social History Narrative  . No narrative on file     PHYSICAL EXAM  Vitals:   04/09/16 0955  BP: 131/74  Pulse: 89  Resp: 16  Weight: 135 lb (61.2 kg)  Height: 5\' 6"  (1.676 m)    Body mass index is 21.79 kg/m.   General: The patient is well-developed and well-nourished and in no acute distress.      Mild :  She has mild tenderness over the left piriformis muscle and the left trochanteric bursa.     Has Tinels' sign on right  Neurologic Exam  Mental status: The patient is alert and oriented x 3 at the time of the examination. The patient has apparent normal recent and remote memory, with an apparently normal attention span and concentration ability.   Speech is normal.  Cranial nerves: Extraocular movements are full.   Facial symmetry is present. There is good facial sensation to soft touch bilaterally.Facial strength is normal.  Trapezius and sternocleidomastoid strength is normal. No dysarthria is noted.   No obvious hearing deficits are noted.  Motor:  Muscle bulk is normal and tone is increased in legs. Strength is  5 / 5 in all 4 extremities.   Sensory: Sensory testing shows reduced sensation to touch/temp over right thenar eminence.   She has right Tine's sign.   She has decreased touch/temp/vib in left leg.        Coordination: Cerebellar testing reveals good finger-nose-finger and slightly reduced heel-to-shin bilaterally.  Gait and station: Station is stable and gait  is mildly wide and arthritic. Tandem gait is wide. Romberg is negative.   Reflexes: Deep tendon reflexes are symmetric and increased at the knees with spread and ankles with nonsustained clonus.Marland Kitchen     DIAGNOSTIC DATA (LABS, IMAGING, TESTING) - I reviewed patient records, labs, notes, testing and imaging myself where  available.      ASSESSMENT AND PLAN  Multiple sclerosis ([place]) - Plan: Hepatic function panel, Hepatic function panel  Ataxic gait  High risk medication use  Dysesthesia  Chronic fatigue  Vitamin D deficiency   1.   She cannot get co-pay assistance right now.  For the time being we will change from Aubagio to Leflunomide as the active metabolite is the same.   Check LFT next month.   Hopefully, the cost will be minimal for her.     2.   Renew Adderall  IR.  Renew Fentanyl patch 3.   Continue OTC vitamin D. Exercise as tolerated. 4.   rtc 4 months, sooner if worse  Richard A. Felecia Shelling, MD, PhD [id number], [id number] AM Certified in Neurology, Clinical Neurophysiology, Sleep Medicine, Pain Medicine and Neuroimaging  [facility] [address] [place], [place] [address] [phone]

## 2016-04-12 ENCOUNTER — Telehealth: Payer: Self-pay | Admitting: Neurology

## 2016-04-12 MED ORDER — LEFLUNOMIDE 20 MG PO TABS
ORAL_TABLET | ORAL | 3 refills | Status: DC
Start: 2016-04-12 — End: 2017-04-06

## 2016-04-12 NOTE — Telephone Encounter (Signed)
At patient's visit on 04-09-16 Dr. Felecia Shelling changed medication from Aubagio to Leflunomide but [facility] [address] [facility] not [facility].

## 2016-04-12 NOTE — Addendum Note (Signed)
Addended by: France Ravens I on: 04/12/2016 10:16 AM   Modules accepted: Orders

## 2016-04-12 NOTE — Telephone Encounter (Signed)
I have spoken with Sheri Burns this morning and advised that [name] rx. [facility].  Reminded her to take 1/2 tab daily for 2 weeks, then increase to 1 tab daily.  She verbalized understanding of same/fim

## 2016-05-05 ENCOUNTER — Telehealth: Payer: Self-pay | Admitting: Neurology

## 2016-05-05 MED ORDER — TRAMADOL HCL 50 MG PO TABS: 50.0000 mg | ORAL_TABLET | Freq: Four times a day (QID) | ORAL | 5 refills | Status: DC | PRN

## 2016-05-05 NOTE — Telephone Encounter (Signed)
Rx. faxed to [facility] [address]/fim

## 2016-05-05 NOTE — Telephone Encounter (Signed)
Rx. awaiting RAS sig/fim 

## 2016-05-05 NOTE — Telephone Encounter (Signed)
Pt request refill for traMADol (ULTRAM) 50 MG tablet [facility]. She called it in on Monday, [facility] advised [facility] today [facility] have tried to get in touch with the clinic but have been unsuccessful. I told her we have not rec'd any request electronically.

## 2016-05-24 ENCOUNTER — Encounter: Payer: Self-pay | Admitting: Nurse Practitioner

## 2016-05-24 ENCOUNTER — Ambulatory Visit (INDEPENDENT_AMBULATORY_CARE_PROVIDER_SITE_OTHER): Payer: PPO | Admitting: Nurse Practitioner

## 2016-05-24 VITALS — BP 124/78 | HR 88 | Resp 20 | Ht 65.75 in | Wt 130.8 lb

## 2016-05-24 DIAGNOSIS — E2839 Other primary ovarian failure: Secondary | ICD-10-CM

## 2016-05-24 DIAGNOSIS — Z01411 Encounter for gynecological examination (general) (routine) with abnormal findings: Secondary | ICD-10-CM | POA: Diagnosis not present

## 2016-05-24 DIAGNOSIS — E559 Vitamin D deficiency, unspecified: Secondary | ICD-10-CM

## 2016-05-24 NOTE — Patient Instructions (Signed)

## 2016-05-24 NOTE — Progress Notes (Signed)
Patient ID: Sheri Burns, female   DOB: 02/16/1956, 61 y.o.   MRN: [id number]  61 y.o. [id number] Married  Caucasian Fe here for annual exam.  No new health problems other than MS.problems and ongoing Neurogenic bladder.  Patient's last menstrual period was 07/06/1993.          Sexually active: No.  The [place]xt[place] method of family planning is status post hysterectomy.    Exercising: No.  The patient does not participate in regular exercise at present. Smoker:  no  Health Maintenance: Pap: TVH 1995 MMG: 07/28/15, Bi-Rads 1: negative Colonoscopy: 12/2013, repeat 5 years BMD: 08/24/11, -0.8 Spine / -2.3 Right Femur Neck / -2.1 Left Femur Neck TDaP: 01/2016 Shingles: No Hep C and HIV: No Labs: PCP   reports that she quit smoking about 37 years ago. Her smoking use included Cigarettes. She has a 5.00 pack-year smoking history. She has never used smokeless tobacco. She reports that she does not drink alcohol or use drugs.  Past Medical History:  Diagnosis Date  . Anxiety   . Bulging lumbar disc 1980  . Chronic pansinusitis   . Colon polyp   . Depression   . GERD (gastroesophageal reflux disease)   . Gynecological examination    sees Dr. Elyse Hsu   . Headache(784.0)   . Multiple sclerosis ([place]) since 1992   Dr. Arlice Colt in [facility]  . Multiple thyroid nodules 1995   per pt. resolved on its own follwed by endo then released  . Neurogenic bladder disorder 12/2009   w/ urge & stress inc. dx. Dr. McDiarmid  . Neuropathy ([place])   . Osteoarthritis   . Osteoporosis    last DEXA 08-28-07  . Overactive bladder    sees Dr. Nicki Reaper McDiarmid   . Pancreatitis 1977  . Ulcerative proctitis ([place])   . Vision abnormalities     Past Surgical History:  Procedure Laterality Date  . APPENDECTOMY    . breast ruptured  left   ductal cyst  . CHOLECYSTECTOMY    . COLONOSCOPY  01/10/09   repeat in 5 yrs Dr. Wilford Corner  . DILATION AND CURETTAGE OF UTERUS    . LAPAROSCOPY     x 3   . left knee surgery  1978   to remove bone tumor    . NASAL SINUS SURGERY Bilateral 01/13/2015   Procedure: ENDOSCOPIC SINUS SURGERY;  Surgeon: Izora Gala, MD;  Location: [facility];  Service: ENT;  Laterality: Bilateral;  . NASAL TURBINATE REDUCTION    . pubo vaginal sling  10/2003  . TONSILLECTOMY    . vaginal cystocele repair     anterior  . VAGINAL HYSTERECTOMY  1995   AUB & Endometriosis  . widom teeth removal      [facility] Prescriptions  Medication Sig Dispense Refill  . acetaminophen (TYLENOL) 500 MG tablet Take 500 mg by mouth 4 (four) times daily.    Marland Kitchen ALPRAZolam (XANAX) 0.5 MG tablet     . amphetamine-dextroamphetamine (ADDERALL) 20 MG tablet Take 1 tablet twice daily as needed. 60 tablet 0  . AUBAGIO 14 MG TABS TAKE 1 TABLET BY MOUTH DAILY. 28 tablet PRN  . Calcium Carb-Cholecalciferol (CALCIUM 600 + D PO) Take 1 tablet by mouth daily.    . cetirizine (ZYRTEC) 10 MG tablet Take 10 mg by mouth.    . clonazePAM (KLONOPIN) 1 MG tablet TAKE 1 TABLET BY MOUTH 3 TIMES A DAY AS NEEDED 90 tablet 2  . cyclobenzaprine (  FLEXERIL) 5 MG tablet Take 1 tablet (5 mg total) by mouth at bedtime. 30 tablet 5  . DULoxetine (CYMBALTA) 60 MG capsule Take 1 capsule (60 mg total) by mouth daily. 90 capsule 3  . fentaNYL (DURAGESIC) 25 MCG/HR patch Place 1 patch (25 mcg total) onto the skin every 3 (three) days. 10 patch 0  . fesoterodine (TOVIAZ) 4 MG TB24 tablet Take 4 mg by mouth daily.    Marland Kitchen gabapentin (NEURONTIN) 600 MG tablet Take 1 tablet (600 mg total) by mouth 4 (four) times daily. 360 tablet 3  . imipramine (TOFRANIL) 25 MG tablet Take 2 tablets at bedtime. 180 tablet 3  . leflunomide (ARAVA) 20 MG tablet Take one-half tablet daily for 2 weeks, then increase to one whole tablet daily. 90 tablet 3  . Multiple Vitamin (MULTIVITAMIN) tablet Take 1 tablet by mouth daily.    Marland Kitchen oxybutynin (DITROPAN-XL) 10 MG 24 hr tablet Take 1 tablet by mouth daily. Reported on  07/16/2015    . traMADol (ULTRAM) 50 MG tablet Take 1 tablet (50 mg total) by mouth every 6 (six) hours as needed. 120 tablet 5  . Vitamin D, Ergocalciferol, (DRISDOL) 50000 units CAPS capsule Take by mouth.     No Payes facility-administered medications for this visit.     Family History  Problem Relation Age of Onset  . Osteoporosis Mother     severe  . Cancer Mother 58    colon /Breast also m. niece at 70's  . Osteopenia Sister     X 2    ROS:  Pertinent items are noted in HPI.  Otherwise, a comprehensive ROS was negative.  Exam:   BP 124/78 (BP Location: Right Arm, Patient Position: Sitting, Cuff Size: Normal)   Pulse 88   Resp 20   Ht 5' 5.75" (1.67 m)   Wt 130 lb 12.8 oz (59.3 kg)   LMP 07/06/1993   BMI 21.27 kg/m  Height: 5' 5.75" (167 cm) Ht Readings from Last 3 Encounters:  05/24/16 5' 5.75" (1.67 m)  04/09/16 5\' 6"  (1.676 m)  10/30/15 5\' 6"  (1.676 m)    General appearance: alert, cooperative and appears stated age Head: Normocephalic, without obvious abnormality, atraumatic Neck: no adenopathy, supple, symmetrical, trachea midline and thyroid normal to inspection and palpation Lungs: clear to auscultation bilaterally Breasts: normal appearance, no masses or tenderness Heart: regular rate and rhythm Abdomen: soft, non-tender; no masses,  no organomegaly Extremities: extremities normal, atraumatic, no cyanosis or edema Skin: Skin color, texture, turgor normal. No rashes or lesions Lymph nodes: Cervical, supraclavicular, and axillary nodes normal. No abnormal inguinal nodes palpated Neurologic: Grossly normal   Pelvic: External genitalia:  no lesions              Urethra:  normal appearing urethra with no masses, tenderness or lesions              Bartholin's and Skene's: normal                 Vagina: normal appearing vagina with normal color and discharge, no lesions              Cervix: absent              Pap taken: No. Bimanual Exam:  Uterus:  uterus  absent              Adnexa: no mass, fullness, tenderness               Rectovaginal:  Confirms               Anus:  normal sphincter tone, no lesions  Chaperone present: no  A:  Well Woman with normal exam      S/P TVH 07/1993 secondary to fibroids and endometriosis             Postmenopausal no HRT             History of MS with a neurogenic bladder, currently with asymptomatic bacteremia              Osteoporosis off Fosamax since last May - to repeat BMD this year - did not get last year - [facility]             S/P Pubo vaginal sling 10/2003             Bulging  disk L 4-5 with chronic pain           P:   Reviewed health and wellness pertinent to exam  Pap smear not done  Mammogram is due 5/18 and will gt BMD at same time  Counseled on breast self exam, mammography screening, adequate intake of calcium and vitamin D, diet and exercise, Kegel's exercises return annually or prn  An After Visit Summary was printed and given to the patient.

## 2016-05-27 NOTE — Progress Notes (Signed)
Encounter reviewed by Dr. Willam Munford Amundson C. Silva.  

## 2016-06-02 ENCOUNTER — Telehealth: Payer: Self-pay | Admitting: Neurology

## 2016-06-02 MED ORDER — FENTANYL 25 MCG/HR TD PT72: 25.0000 ug | MEDICATED_PATCH | TRANSDERMAL | 0 refills | Status: DC

## 2016-06-02 NOTE — Telephone Encounter (Signed)
Rx. awaiting RAS sig/fim 

## 2016-06-02 NOTE — Telephone Encounter (Signed)
Pt request refill for fentaNYL (DURAGESIC) 25 MCG/HR patch ° ° °

## 2016-06-02 NOTE — Telephone Encounter (Signed)
Rx. up front [facility]/fim 

## 2016-06-14 ENCOUNTER — Other Ambulatory Visit: Payer: Self-pay | Admitting: Neurology

## 2016-06-17 ENCOUNTER — Other Ambulatory Visit: Payer: Self-pay | Admitting: *Deleted

## 2016-06-17 ENCOUNTER — Ambulatory Visit (INDEPENDENT_AMBULATORY_CARE_PROVIDER_SITE_OTHER): Payer: PPO | Admitting: Neurology

## 2016-06-17 ENCOUNTER — Telehealth: Payer: Self-pay | Admitting: Neurology

## 2016-06-17 ENCOUNTER — Encounter: Payer: Self-pay | Admitting: Neurology

## 2016-06-17 DIAGNOSIS — Z79899 Other long term (current) drug therapy: Secondary | ICD-10-CM

## 2016-06-17 DIAGNOSIS — E559 Vitamin D deficiency, unspecified: Secondary | ICD-10-CM

## 2016-06-17 DIAGNOSIS — G35 Multiple sclerosis: Secondary | ICD-10-CM | POA: Diagnosis not present

## 2016-06-17 DIAGNOSIS — M5431 Sciatica, right side: Secondary | ICD-10-CM | POA: Diagnosis not present

## 2016-06-17 MED ORDER — AMPHETAMINE-DEXTROAMPHETAMINE 20 MG PO TABS: ORAL_TABLET | ORAL | 0 refills | Status: DC

## 2016-06-17 MED ORDER — FENTANYL 25 MCG/HR TD PT72: 25.0000 ug | MEDICATED_PATCH | TRANSDERMAL | 0 refills | Status: DC

## 2016-06-17 NOTE — Progress Notes (Signed)
[facility]  PATIENT: Sheri Burns DOB: February 27, 1956  REFERRING CLINICIAN: Thressa Sheller  HISTORY FROM: Patient REASON FOR VISIT: MS   HISTORICAL  CHIEF COMPLAINT:  Chief Complaint  Patient presents with  . Multiple Sclerosis    Sts. she is tolerating Leflunamide well.  Today she c/o right sided lbp radiating down front of right leg to knee.  Requesting tpi today if appropriate/fim  . Back Pain    HISTORY OF PRESENT ILLNESS:  Sheri Burns is a 61 year old woman with MS.     MS:   She has been unable to afford Aubagio and her co-pay was very high. Therefore, we switched to leflunomide. She has tolerated it well. She has not had any MS exacerbations.   Pain:   Last week her LBP worsened while she was traveling.  She was on a plane trip.   She reports right-sided low back pain that radiates down the front of the right leg to the knee.  Pain is worse with walking. She denies any change in her bladder.  No change in gait.    MS History:  She was diagnosed with multiple sclerosis more than 20 years ago. Initially, she was treated with Betaseron then Copaxone but has not been on any disease modifying therapy for about 8 years. During this time, she has had occasional  Exacerbations with more fatigue and gait issues.  She also has noted mild cognitive dysfunction worsening over the past 5 or 6 years. Her MRI of the brain performed 08/31/2012 shows foci that are predominantly periventricular. They are consistent with the diagnosis of multiple sclerosis. When this MRI was compared to an MRI dated 10/12/2010, there was one additional small periventricular focus. There were no acute findings on either MRI.   In November 2015, she had an exacerbation with severe fatigue, worsened gait and dysphagia.    She started Aubagio 09/09/14.        REVIEW OF SYSTEMS:  Constitutional: No fevers, chills, sweats, or change in appetite.  She notes a lot of fatigue. She is sleeping  well. Eyes: No visual changes, double vision, eye pain Ear, nose and throat: No hearing loss, ear pain, nasal congestion, sore throat Cardiovascular: No chest pain, palpitations Respiratory:  No shortness of breath at rest or with exertion.   No wheezes GastrointestinaI: No nausea, vomiting, diarrhea.  Has constipation Genitourinary:  see above. Musculoskeletal:  reports pain in knees.   Reports neck pain and back pain Integumentary: No rash, pruritus, skin lesions Neurological: as above Psychiatric: Depression and  Anxiety noted Endocrine: No palpitations, diaphoresis, change in appetite, change in weigh or increased thirst Hematologic/Lymphatic:  No anemia, purpura, petechiae. Allergic/Immunologic: No itchy/runny eyes, nasal congestion, recent allergic reactions, rashes  ALLERGIES: Allergies  Allergen Reactions  . Codeine Nausea And Vomiting    vomiting  . Demerol [Meperidine] Nausea And Vomiting  . Penicillins   . Stadol [Butorphanol] Other (See Comments)    Per patient psychotic event  . Sulfonamide Derivatives Hives    HOME MEDICATIONS: Outpatient Medications Prior to Visit  Medication Sig Dispense Refill  . acetaminophen (TYLENOL) 500 MG tablet Take 500 mg by mouth 4 (four) times daily.    . AUBAGIO 14 MG TABS TAKE 1 TABLET BY MOUTH DAILY. 28 tablet PRN  . Calcium Carb-Cholecalciferol (CALCIUM 600 + D PO) Take 1 tablet by mouth daily.    . cetirizine (ZYRTEC) 10 MG tablet Take 10 mg by mouth.    Derrill Memo ON 06/18/2016] clonazePAM (  KLONOPIN) 1 MG tablet TAKE 1 TABLET 3 TIMES A DAY AS NEEDED 90 tablet 2  . cyclobenzaprine (FLEXERIL) 5 MG tablet Take 1 tablet (5 mg total) by mouth at bedtime. 30 tablet 5  . DULoxetine (CYMBALTA) 60 MG capsule Take 1 capsule (60 mg total) by mouth daily. 90 capsule 3  . fesoterodine (TOVIAZ) 4 MG TB24 tablet Take 4 mg by mouth daily.    Marland Kitchen gabapentin (NEURONTIN) 600 MG tablet Take 1 tablet (600 mg total) by mouth 4 (four) times daily. 360 tablet  3  . imipramine (TOFRANIL) 25 MG tablet Take 2 tablets at bedtime. 180 tablet 3  . leflunomide (ARAVA) 20 MG tablet Take one-half tablet daily for 2 weeks, then increase to one whole tablet daily. 90 tablet 3  . Multiple Vitamin (MULTIVITAMIN) tablet Take 1 tablet by mouth daily.    Marland Kitchen oxybutynin (DITROPAN-XL) 10 MG 24 hr tablet Take 1 tablet by mouth daily. Reported on 07/16/2015    . traMADol (ULTRAM) 50 MG tablet Take 1 tablet (50 mg total) by mouth every 6 (six) hours as needed. 120 tablet 5  . amphetamine-dextroamphetamine (ADDERALL) 20 MG tablet Take 1 tablet twice daily as needed. 60 tablet 0  . fentaNYL (DURAGESIC) 25 MCG/HR patch Place 1 patch (25 mcg total) onto the skin every 3 (three) days. 10 patch 0   No facility-administered medications prior to visit.     PAST MEDICAL HISTORY: Past Medical History:  Diagnosis Date  . Anxiety   . Bulging lumbar disc 1980  . Chronic pansinusitis   . Colon polyp   . Depression   . GERD (gastroesophageal reflux disease)   . Gynecological examination    sees Dr. Elyse Hsu   . Headache(784.0)   . Multiple sclerosis ([place]) since 1992   Dr. Arlice Colt in [facility]  . Multiple thyroid nodules 1995   per pt. resolved on its own follwed by endo then released  . Neurogenic bladder disorder 12/2009   w/ urge & stress inc. dx. Dr. McDiarmid  . Neuropathy ([place])   . Osteoarthritis   . Osteoporosis    last DEXA 08-28-07  . Overactive bladder    sees Dr. Nicki Reaper McDiarmid   . Pancreatitis 1977  . Ulcerative proctitis ([place])   . Vision abnormalities     PAST SURGICAL HISTORY: Past Surgical History:  Procedure Laterality Date  . APPENDECTOMY    . breast ruptured  left   ductal cyst  . CHOLECYSTECTOMY    . COLONOSCOPY  01/10/09   repeat in 5 yrs Dr. Wilford Corner  . DILATION AND CURETTAGE OF UTERUS    . LAPAROSCOPY     x 3  . left knee surgery  1978   to remove bone tumor    . NASAL SINUS SURGERY Bilateral 01/13/2015   Procedure:  ENDOSCOPIC SINUS SURGERY;  Surgeon: Izora Gala, MD;  Location: [facility];  Service: ENT;  Laterality: Bilateral;  . NASAL TURBINATE REDUCTION    . pubo vaginal sling  10/2003  . TONSILLECTOMY    . vaginal cystocele repair     anterior  . VAGINAL HYSTERECTOMY  1995   AUB & Endometriosis  . widom teeth removal      FAMILY HISTORY: Family History  Problem Relation Age of Onset  . Osteoporosis Mother     severe  . Cancer Mother 46    colon /Breast also m. niece at 66's  . Osteopenia Sister     X 2  SOCIAL HISTORY:  Social History   Social History  . Marital status: Married    Spouse name: Jori Moll  . Number of children: 1  . Years of education: 48   Occupational History  . 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 Smoking status: Former Smoker    Packs/day: 1.00    Years: 5.00    Types: Cigarettes    Quit date: 03/09/1979  . Smokeless tobacco: Never Used  . Alcohol use No  . Drug use: No  . Sexual activity: Not Currently    Partners: Male    Birth control/ protection: Surgical   Other Topics Concern  . Not on file   Social History Narrative  . No narrative on file     PHYSICAL EXAM  Vitals:   06/17/16 1629  BP: 122/69  Pulse: 78  Resp: 16  Weight: 129 lb (58.5 kg)    Body mass index is 20.98 kg/m.   General: The patient is well-developed and well-nourished and in no acute distress.      musculoskeletal:  She has tenderness over the right lower lumbar paraspinal muscles and the right piriformis muscle  Neurologic Exam  Mental status: The patient is alert and oriented x 3 at the time of the examination. The patient has apparent normal recent and remote memory, with an apparently normal attention span and concentration ability.   Speech is normal.  Cranial nerves: Extraocular movements are full.   Facial symmetry is present. There is good facial sensation to soft touch bilaterally.Facial strength  is normal.  Trapezius and sternocleidomastoid strength is normal. No dysarthria is noted.   No obvious hearing deficits are noted.  Motor:  Muscle bulk is normal and tone is increased in legs. Strength is  5 / 5 in all 4 extremities.   Sensory: Sensory testing shows decreased touch/temp/vib in left leg.        Coordination: Cerebellar testing reveals slightly reduced heel-to-shin bilaterally.  Gait and station: Station is stable and gait is mildly wide and arthritic. Tandem gait is wide. Romberg is negative.   Reflexes: Deep tendon reflexes are symmetric and increased at the knees with spread and ankles with nonsustained clonus.Marland Kitchen     DIAGNOSTIC DATA (LABS, IMAGING, TESTING) - I reviewed patient records, labs, notes, testing and imaging myself where available.      ASSESSMENT AND PLAN  Multiple sclerosis ([place]) - Plan: Hepatic Function Panel, CBC with Differential/Platelet, Vitamin D, 25-hydroxy  High risk medication use - Plan: Hepatic Function Panel, CBC with Differential/Platelet, Vitamin D, 25-hydroxy  Vitamin D deficiency - Plan: Hepatic Function Panel, CBC with Differential/Platelet, Vitamin D, 25-hydroxy  Sciatica, right side   1.   Continue Leflunomide     Check LFT and CBC     2.   Renew Adderall  IR.  Renew Fentanyl patch 3.  Trigger point injection of the right piriformis and the right L4 L5 S1 paraspinal muscles with 80 mg Depo-Medrol in 4 mL Marcaine. She tolerated the procedure well and there were no complications.  4.   rtc 4 months, sooner if worse  Kilan Banfill A. Felecia Shelling, MD, PhD 5/94/5859, 2:92 PM Certified in Neurology, Clinical Neurophysiology, Sleep Medicine, Pain Medicine and Neuroimaging  [facility] [address] [place], [place] [address] [phone]

## 2016-06-17 NOTE — Telephone Encounter (Signed)
Pt is having really bad back pain since Saturday and is requesting a call from [facility]

## 2016-06-17 NOTE — Telephone Encounter (Signed)
I have spoken with Sheri Burns this afternoon.  She c/o right sided lbp with right sided sciatica.  Requesting tpi today if appropriate.  W/I appt. given with RAS, to arrive at 3:45pm today.  Pt. is agreeable and aware this is a w/I appt/ will likely be a slight wait/fim

## 2016-06-18 LAB — CBC WITH DIFFERENTIAL/PLATELET
Basophils Absolute: 0.1 10*3/uL (ref 0.0–0.2)
Basos: 1 %
EOS (ABSOLUTE): 0.5 10*3/uL — AB (ref 0.0–0.4)
EOS: 7 %
HEMATOCRIT: 39.3 % (ref 34.0–46.6)
Hemoglobin: 12.6 g/dL (ref 11.1–15.9)
Immature Grans (Abs): 0 10*3/uL (ref 0.0–0.1)
Immature Granulocytes: 0 %
LYMPHS ABS: 3.2 10*3/uL — AB (ref 0.7–3.1)
Lymphs: 38 %
MCH: 29.5 pg (ref 26.6–33.0)
MCHC: 32.1 g/dL (ref 31.5–35.7)
MCV: 92 fL (ref 79–97)
MONOS ABS: 0.9 10*3/uL (ref 0.1–0.9)
Monocytes: 11 %
NEUTROS PCT: 43 %
Neutrophils Absolute: 3.5 10*3/uL (ref 1.4–7.0)
PLATELETS: 323 10*3/uL (ref 150–379)
RBC: 4.27 x10E6/uL (ref 3.77–5.28)
RDW: 13.2 % (ref 12.3–15.4)
WBC: 8.2 10*3/uL (ref 3.4–10.8)

## 2016-06-18 LAB — HEPATIC FUNCTION PANEL
ALK PHOS: 85 IU/L (ref 39–117)
ALT: 22 IU/L (ref 0–32)
AST: 28 IU/L (ref 0–40)
Albumin: 4.4 g/dL (ref 3.6–4.8)
BILIRUBIN, DIRECT: 0.07 mg/dL (ref 0.00–0.40)
Bilirubin Total: <0.2 mg/dL (ref 0.0–1.2)
TOTAL PROTEIN: 7.2 g/dL (ref 6.0–8.5)

## 2016-06-18 LAB — VITAMIN D 25 HYDROXY (VIT D DEFICIENCY, FRACTURES): Vit D, 25-Hydroxy: 30.6 ng/mL (ref 30.0–100.0)

## 2016-06-21 ENCOUNTER — Ambulatory Visit: Payer: Self-pay | Admitting: Neurology

## 2016-06-21 ENCOUNTER — Ambulatory Visit: Payer: PPO | Admitting: Neurology

## 2016-06-22 ENCOUNTER — Telehealth: Payer: Self-pay | Admitting: *Deleted

## 2016-06-22 DIAGNOSIS — J322 Chronic ethmoidal sinusitis: Secondary | ICD-10-CM | POA: Diagnosis not present

## 2016-06-22 DIAGNOSIS — J321 Chronic frontal sinusitis: Secondary | ICD-10-CM | POA: Diagnosis not present

## 2016-06-22 MED ORDER — VITAMIN D (ERGOCALCIFEROL) 1.25 MG (50000 UNIT) PO CAPS: 50000.0000 [IU] | ORAL_CAPSULE | ORAL | 0 refills | Status: DC

## 2016-06-22 NOTE — Telephone Encounter (Signed)
I have spoken with Sheri Burns this afternoon and per RAS, reviewed lab results as below.  She verbalized understanding of same, is agreeable to taking rx. vit. d, then otc vit. d.  Rx. escribed to [facility]/fim

## 2016-06-22 NOTE — Telephone Encounter (Signed)
-----   Message from Britt Bottom, MD sent at 06/18/2016  1:07 PM EDT ----- Vitamin D is low normal. Lets have her take 50,000 units weekly 8 weeks and then have her take 5000 units daily OTC.

## 2016-06-24 ENCOUNTER — Telehealth: Payer: Self-pay | Admitting: Nurse Practitioner

## 2016-06-24 NOTE — Telephone Encounter (Signed)
Patient wants to speak with the nurse no information given. °

## 2016-06-24 NOTE — Telephone Encounter (Signed)
Spoke with patient. Patient states that she had her Vitamin D checked at her recent Neurology appointment and was started on 50,000 units weekly 8 weeks and then will take 5000 units daily OTC. Will have her Vitamin D rechecked in 6 months with Neurology.   Routing to provider for final review. Patient agreeable to disposition. Will close encounter.

## 2016-07-20 ENCOUNTER — Telehealth: Payer: Self-pay | Admitting: *Deleted

## 2016-07-20 ENCOUNTER — Other Ambulatory Visit: Payer: Self-pay | Admitting: *Deleted

## 2016-07-20 ENCOUNTER — Other Ambulatory Visit: Payer: Self-pay | Admitting: Nurse Practitioner

## 2016-07-20 DIAGNOSIS — Z1231 Encounter for screening mammogram for malignant neoplasm of breast: Secondary | ICD-10-CM

## 2016-07-20 NOTE — Telephone Encounter (Signed)
Laticia calling from [facility]. Was advised patient can not schedule BMD at [facility], must go back to previous Imaging location in order to compare images. Jonita Albee has advised patient.  Last BMD 08/24/11 at [place].   Kem Boroughs, NP -routing FYI.

## 2016-08-09 ENCOUNTER — Telehealth: Payer: Self-pay | Admitting: Neurology

## 2016-08-09 ENCOUNTER — Ambulatory Visit
Admission: RE | Admit: 2016-08-09 | Discharge: 2016-08-09 | Disposition: A | Discharge status: Home / Self Care | Payer: PPO | Source: Ambulatory Visit | Attending: Nurse Practitioner | Admitting: Nurse Practitioner

## 2016-08-09 DIAGNOSIS — Z1231 Encounter for screening mammogram for malignant neoplasm of breast: Secondary | ICD-10-CM | POA: Diagnosis not present

## 2016-08-09 NOTE — Telephone Encounter (Signed)
Patient calling stating having severe pain in lower back down left leg x 1 week. Please call and discuss.

## 2016-08-09 NOTE — Telephone Encounter (Signed)
I have spoken with Sheri Burns.  She has new c/o lbp radiating into right leg.  No known injury.  Appt. 08/12/16/fim

## 2016-08-12 ENCOUNTER — Ambulatory Visit (INDEPENDENT_AMBULATORY_CARE_PROVIDER_SITE_OTHER): Payer: PPO | Admitting: Neurology

## 2016-08-12 ENCOUNTER — Encounter: Payer: Self-pay | Admitting: Neurology

## 2016-08-12 VITALS — BP 131/70 | HR 93 | Resp 16 | Ht 65.75 in | Wt 132.0 lb

## 2016-08-12 DIAGNOSIS — R260 Ataxic gait: Secondary | ICD-10-CM

## 2016-08-12 DIAGNOSIS — R5382 Chronic fatigue, unspecified: Secondary | ICD-10-CM | POA: Diagnosis not present

## 2016-08-12 DIAGNOSIS — M5432 Sciatica, left side: Secondary | ICD-10-CM | POA: Diagnosis not present

## 2016-08-12 DIAGNOSIS — Z79899 Other long term (current) drug therapy: Secondary | ICD-10-CM

## 2016-08-12 DIAGNOSIS — G35 Multiple sclerosis: Secondary | ICD-10-CM | POA: Diagnosis not present

## 2016-08-12 MED ORDER — AMPHETAMINE-DEXTROAMPHETAMINE 20 MG PO TABS: ORAL_TABLET | ORAL | 0 refills | Status: DC

## 2016-08-12 MED ORDER — METHOCARBAMOL 500 MG PO TABS: 500.0000 mg | ORAL_TABLET | Freq: Three times a day (TID) | ORAL | 5 refills | Status: DC | PRN

## 2016-08-12 MED ORDER — FENTANYL 25 MCG/HR TD PT72: 25.0000 ug | MEDICATED_PATCH | TRANSDERMAL | 0 refills | Status: DC

## 2016-08-12 NOTE — Progress Notes (Signed)
[facility]  PATIENT: Sheri Burns DOB: 10-14-1955  REFERRING CLINICIAN: Thressa Sheller  HISTORY FROM: Patient REASON FOR VISIT: MS   HISTORICAL  CHIEF COMPLAINT:  Chief Complaint  Patient presents with  . Multiple Sclerosis    Sts. she continues to tolerate Arava well.  Today c/o left sided lbp radiating down front of left leg to knee.  Onset about 2 wks ago with no known injury/fim  . Back Pain    HISTORY OF PRESENT ILLNESS:  Sheri Burns is a 61 year old woman with MS.     Pain:   She reports new pain in the left buttock that radiates down the leg. She also has numbness in the left anterolateral thigh and some pain in the lateral groin region.   In the past, she had similar pain in the right buttock that improved after trigger point injection.  MS:   Because her Aubagio co-pay was very high, I placed her on leflunomide. She is tolerating it well and has had no MS exacerbation.  Her last MRI of the brain was some are 2016 and we discussed getting another one to determine if she is having any subclinical activity that would make [place] consider a different medication.  Gait/strength/sensation: She reports that her gait is stable. She still notes mild weakness in her legs and mild problems with her balance. She notes numbness and sometimes painful tingling in the left leg.  Bladder:   She denies any change in her bladder.   Fatigue: She continues to report difficulties with physical and cognitive fatigue. This is improved with Adderall  MS History:  She was diagnosed with multiple sclerosis more than 20 years ago. Initially, she was treated with Betaseron then Copaxone but has not been on any disease modifying therapy for about 8 years. During this time, she has had occasional  Exacerbations with more fatigue and gait issues.  She also has noted mild cognitive dysfunction worsening over the past 5 or 6 years. Her MRI of the brain performed 08/31/2012 shows  foci that are predominantly periventricular. They are consistent with the diagnosis of multiple sclerosis. When this MRI was compared to an MRI dated 10/12/2010, there was one additional small periventricular focus. There were no acute findings on either MRI.   In November 2015, she had an exacerbation with severe fatigue, worsened gait and dysphagia.    She started Aubagio 09/09/14.        REVIEW OF SYSTEMS:  Constitutional: No fevers, chills, sweats, or change in appetite.  She notes a lot of fatigue. She is sleeping well. Eyes: No visual changes, double vision, eye pain Ear, nose and throat: No hearing loss, ear pain, nasal congestion, sore throat Cardiovascular: No chest pain, palpitations Respiratory:  No shortness of breath at rest or with exertion.   No wheezes GastrointestinaI: No nausea, vomiting, diarrhea.  Has constipation Genitourinary:  see above. Musculoskeletal:  reports pain in knees.   Reports neck pain and back pain Integumentary: No rash, pruritus, skin lesions Neurological: as above Psychiatric: Depression and  Anxiety noted Endocrine: No palpitations, diaphoresis, change in appetite, change in weigh or increased thirst Hematologic/Lymphatic:  No anemia, purpura, petechiae. Allergic/Immunologic: No itchy/runny eyes, nasal congestion, recent allergic reactions, rashes  ALLERGIES: Allergies  Allergen Reactions  . Codeine Nausea And Vomiting    vomiting  . Demerol [Meperidine] Nausea And Vomiting  . Penicillins   . Stadol [Butorphanol] Other (See Comments)    Per patient psychotic event  . Sulfonamide Derivatives  Hives    HOME MEDICATIONS: Outpatient Medications Prior to Visit  Medication Sig Dispense Refill  . acetaminophen (TYLENOL) 500 MG tablet Take 500 mg by mouth 4 (four) times daily.    . Calcium Carb-Cholecalciferol (CALCIUM 600 + D PO) Take 1 tablet by mouth daily.    . cetirizine (ZYRTEC) 10 MG tablet Take 10 mg by mouth.    . clonazePAM (KLONOPIN) 1 MG  tablet TAKE 1 TABLET 3 TIMES A DAY AS NEEDED 90 tablet 2  . cyclobenzaprine (FLEXERIL) 5 MG tablet Take 1 tablet (5 mg total) by mouth at bedtime. 30 tablet 5  . DULoxetine (CYMBALTA) 60 MG capsule Take 1 capsule (60 mg total) by mouth daily. 90 capsule 3  . fesoterodine (TOVIAZ) 4 MG TB24 tablet Take 4 mg by mouth daily.    Marland Kitchen gabapentin (NEURONTIN) 600 MG tablet Take 1 tablet (600 mg total) by mouth 4 (four) times daily. 360 tablet 3  . imipramine (TOFRANIL) 25 MG tablet Take 2 tablets at bedtime. 180 tablet 3  . leflunomide (ARAVA) 20 MG tablet Take one-half tablet daily for 2 weeks, then increase to one whole tablet daily. 90 tablet 3  . Multiple Vitamin (MULTIVITAMIN) tablet Take 1 tablet by mouth daily.    Marland Kitchen oxybutynin (DITROPAN-XL) 10 MG 24 hr tablet Take 1 tablet by mouth daily. Reported on 07/16/2015    . traMADol (ULTRAM) 50 MG tablet Take 1 tablet (50 mg total) by mouth every 6 (six) hours as needed. 120 tablet 5  . Vitamin D, Ergocalciferol, (DRISDOL) 50000 units CAPS capsule Take 1 capsule (50,000 Units total) by mouth every 7 (seven) days. Once done with prescription, take otc Vitamin D 5,000u once daily. 8 capsule 0  . amphetamine-dextroamphetamine (ADDERALL) 20 MG tablet Take 1 tablet twice daily as needed. 60 tablet 0  . AUBAGIO 14 MG TABS TAKE 1 TABLET BY MOUTH DAILY. 28 tablet PRN  . fentaNYL (DURAGESIC) 25 MCG/HR patch Place 1 patch (25 mcg total) onto the skin every 3 (three) days. 10 patch 0   No facility-administered medications prior to visit.     PAST MEDICAL HISTORY: Past Medical History:  Diagnosis Date  . Anxiety   . Bulging lumbar disc 1980  . Chronic pansinusitis   . Colon polyp   . Depression   . GERD (gastroesophageal reflux disease)   . Gynecological examination    sees Dr. Elyse Hsu   . Headache(784.0)   . Multiple sclerosis ([place]) since 1992   Dr. Arlice Colt in [facility]  . Multiple thyroid nodules 1995   per pt. resolved on its own follwed by  endo then released  . Neurogenic bladder disorder 12/2009   w/ urge & stress inc. dx. Dr. McDiarmid  . Neuropathy   . Osteoarthritis   . Osteoporosis    last DEXA 08-28-07  . Overactive bladder    sees Dr. Nicki Reaper McDiarmid   . Pancreatitis 1977  . Ulcerative proctitis ([place])   . Vision abnormalities     PAST SURGICAL HISTORY: Past Surgical History:  Procedure Laterality Date  . APPENDECTOMY    . breast ruptured  left   ductal cyst  . CHOLECYSTECTOMY    . COLONOSCOPY  01/10/09   repeat in 5 yrs Dr. Wilford Corner  . DILATION AND CURETTAGE OF UTERUS    . LAPAROSCOPY     x 3  . left knee surgery  1978   to remove bone tumor    . NASAL SINUS SURGERY Bilateral 01/13/2015  Procedure: ENDOSCOPIC SINUS SURGERY;  Surgeon: Izora Gala, MD;  Location: [facility];  Service: ENT;  Laterality: Bilateral;  . NASAL TURBINATE REDUCTION    . pubo vaginal sling  10/2003  . TONSILLECTOMY    . vaginal cystocele repair     anterior  . VAGINAL HYSTERECTOMY  1995   AUB & Endometriosis  . widom teeth removal      FAMILY HISTORY: Family History  Problem Relation Age of Onset  . Osteoporosis Mother        severe  . Cancer Mother 97       colon /Breast also m. niece at 59's  . Breast cancer Mother   . Osteopenia Sister        X 2  . Breast cancer Other     SOCIAL HISTORY:  Social History   Social History  . Marital status: Married    Spouse name: Jori Moll  . Number of children: 1  . 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 Smoking status: Former Smoker    Packs/day: 1.00    Years: 5.00    Types: Cigarettes    Quit date: 03/09/1979  . Smokeless tobacco: Never Used  . Alcohol use No  . Drug use: No  . Sexual activity: Not Currently    Partners: Male    Birth control/ protection: Surgical   Other Topics Concern  . Not on file   Social History Narrative  . No narrative on  file     PHYSICAL EXAM  Vitals:   08/12/16 0905  BP: 131/70  Pulse: 93  Resp: 16  Weight: 132 lb (59.9 kg)  Height: 5' 5.75" (1.67 m)    Body mass index is 21.47 kg/m.   General: The patient is well-developed and well-nourished and in no acute distress.      musculoskeletal:  She has tenderness over the right lower lumbar paraspinal muscles and the right piriformis muscle  Neurologic Exam  Mental status: The patient is alert and oriented x 3 at the time of the examination. The patient has apparent normal recent and remote memory, with an apparently normal attention span and concentration ability.   Speech is normal.  Cranial nerves: Extraocular movements are full.   Facial strength and sensation is normal. Trapezius and sternocleidomastoid strength is normal. No dysarthria is noted.   No obvious hearing deficits are noted.  Motor:  Muscle bulk is normal and tone is increased in legs. Strength is  5 / 5 in all 4 extremities.   Sensory: Sensory testing shows decreased sensation to touch, temperature and vibration in the left foot. Additionally there is decreased sensation to touch in the distribution of the left lateral femoral cutaneous nerve.  Coordination: Cerebellar testing reveals slightly reduced heel-to-shin bilaterally.  Gait and station: Station is stable. The gait is mildly wide and the tandem gait is moderately wide.   Romberg sign is negative.  Reflexes: Deep tendon reflexes are symmetric and fairly normal in the arms. However, she has increased reflexes in the legs with spread at the knees and a couple beats of nonsustained clonus in the ankles...     DIAGNOSTIC DATA (LABS, IMAGING, TESTING) - I reviewed patient records, labs, notes, testing and imaging myself where available.      ASSESSMENT AND PLAN  MULTIPLE SCLEROSIS - Plan: Comprehensive metabolic panel, MR BRAIN W WO CONTRAST  Sciatica  of left side  Ataxic gait - Plan: MR BRAIN W WO  CONTRAST  Chronic fatigue  High risk medication use - Plan: Comprehensive metabolic panel  1.   Continue Leflunomide     Check LFT and CBC .   We will check an MRI of the brain to make sure that there has not been subclinical progression of her MS. If this is occurring we need to consider a different agent.    2.   Renew Adderall  IR.  Renew Fentanyl patch 3.  Trigger point injection of the left piriformis and the left L4 L5 S1 paraspinal muscles with 80 mg Depo-Medrol in 5 mL Marcaine. She tolerated the procedure well and there were no complications.  4.   rtc 4 months, sooner if worse  Dareth Andrew A. Felecia Shelling, MD, PhD 08/10/3835, 7:93 AM Certified in Neurology, Clinical Neurophysiology, Sleep Medicine, Pain Medicine and Neuroimaging  [facility] [address] [place], [place] [address] [phone]

## 2016-08-13 LAB — COMPREHENSIVE METABOLIC PANEL
ALK PHOS: 80 IU/L (ref 39–117)
ALT: 23 IU/L (ref 0–32)
AST: 29 IU/L (ref 0–40)
Albumin/Globulin Ratio: 1.5 (ref 1.2–2.2)
Albumin: 4.2 g/dL (ref 3.6–4.8)
BUN / CREAT RATIO: 15 (ref 12–28)
BUN: 11 mg/dL (ref 8–27)
CHLORIDE: 101 mmol/L (ref 96–106)
CO2: 26 mmol/L (ref 18–29)
CREATININE: 0.71 mg/dL (ref 0.57–1.00)
Calcium: 9.6 mg/dL (ref 8.7–10.3)
GFR calc Af Amer: 106 mL/min/{1.73_m2} (ref >59)
GFR calc non Af Amer: 92 mL/min/{1.73_m2} (ref >59)
GLOBULIN, TOTAL: 2.8 g/dL (ref 1.5–4.5)
GLUCOSE: 85 mg/dL (ref 65–99)
Potassium: 4.8 mmol/L (ref 3.5–5.2)
SODIUM: 139 mmol/L (ref 134–144)
Total Protein: 7 g/dL (ref 6.0–8.5)

## 2016-08-16 ENCOUNTER — Telehealth: Payer: Self-pay | Admitting: *Deleted

## 2016-08-16 NOTE — Telephone Encounter (Signed)
I have spoken with Sheri Burns this morning and per RAS, advised lab work done in our office looks ok.  She verbalized understanding of same/fim

## 2016-08-16 NOTE — Telephone Encounter (Signed)
-----   Message from Britt Bottom, MD sent at 08/13/2016  1:16 PM EDT ----- Please let the patient know that the lab work is fine.

## 2016-08-17 ENCOUNTER — Telehealth: Payer: Self-pay | Admitting: Nurse Practitioner

## 2016-08-17 DIAGNOSIS — M810 Age-related osteoporosis without current pathological fracture: Secondary | ICD-10-CM

## 2016-08-17 NOTE — Telephone Encounter (Signed)
Spoke with patient and let her know that we sent a new order for Sheri Burns for her DEXA. Patient voiced understanding -eh

## 2016-08-17 NOTE — Telephone Encounter (Signed)
We can put order in for BMD at [facility].

## 2016-08-17 NOTE — Telephone Encounter (Signed)
Patient had previus BMD done at [facility] and not [facility]. Patient wanted to make Edman Circle, FNP aware of this and no need to return her call.

## 2016-08-31 ENCOUNTER — Ambulatory Visit
Admission: RE | Admit: 2016-08-31 | Discharge: 2016-08-31 | Disposition: A | Discharge status: Home / Self Care | Payer: PPO | Source: Ambulatory Visit | Attending: Neurology | Admitting: Neurology

## 2016-08-31 DIAGNOSIS — R260 Ataxic gait: Secondary | ICD-10-CM

## 2016-08-31 DIAGNOSIS — G35 Multiple sclerosis: Secondary | ICD-10-CM

## 2016-08-31 MED ORDER — DIPHENHYDRAMINE HCL 50 MG PO CAPS
50.0000 mg | ORAL_CAPSULE | Freq: Once | ORAL | Status: AC
  Administered 2016-08-31: 50 mg via ORAL

## 2016-08-31 MED ORDER — GADOBENATE DIMEGLUMINE 529 MG/ML IV SOLN: 11.0000 mL | Freq: Once | INTRAVENOUS | Status: DC | PRN

## 2016-09-03 ENCOUNTER — Ambulatory Visit
Admission: RE | Admit: 2016-09-03 | Discharge: 2016-09-03 | Disposition: A | Discharge status: Home / Self Care | Payer: PPO | Source: Ambulatory Visit | Attending: Nurse Practitioner | Admitting: Nurse Practitioner

## 2016-09-03 DIAGNOSIS — M810 Age-related osteoporosis without current pathological fracture: Secondary | ICD-10-CM | POA: Diagnosis not present

## 2016-09-06 ENCOUNTER — Telehealth: Payer: Self-pay | Admitting: *Deleted

## 2016-09-06 ENCOUNTER — Telehealth: Payer: Self-pay | Admitting: Neurology

## 2016-09-06 MED ORDER — FENTANYL 25 MCG/HR TD PT72: 25.0000 ug | MEDICATED_PATCH | TRANSDERMAL | 0 refills | Status: DC

## 2016-09-06 NOTE — Telephone Encounter (Signed)
-----   Message from Britt Bottom, MD sent at 09/04/2016  4:02 PM EDT ----- Please let her know that the MRI of the brain does not show any new MS lesions.

## 2016-09-06 NOTE — Telephone Encounter (Signed)
Pt request refill for fentaNYL (DURAGESIC) 25 MCG/HR patch ° ° °

## 2016-09-06 NOTE — Telephone Encounter (Signed)
Rx. up front [facility]/fim 

## 2016-09-06 NOTE — Telephone Encounter (Signed)
RAS sig/fim 

## 2016-09-06 NOTE — Telephone Encounter (Signed)
I have spoken with Sheri Burns this morning and per RAS, advised MRI brain shows no new MS changes.  She verbalized understanding of same/fim

## 2016-09-09 ENCOUNTER — Other Ambulatory Visit: Payer: Self-pay | Admitting: Neurology

## 2016-09-13 ENCOUNTER — Ambulatory Visit: Payer: PPO | Admitting: Obstetrics and Gynecology

## 2016-09-14 ENCOUNTER — Encounter: Payer: Self-pay | Admitting: *Deleted

## 2016-09-15 ENCOUNTER — Ambulatory Visit (INDEPENDENT_AMBULATORY_CARE_PROVIDER_SITE_OTHER): Payer: PPO | Admitting: Obstetrics and Gynecology

## 2016-09-15 ENCOUNTER — Encounter: Payer: Self-pay | Admitting: Obstetrics and Gynecology

## 2016-09-15 VITALS — BP 118/60 | HR 80 | Resp 16 | Wt 131.0 lb

## 2016-09-15 DIAGNOSIS — M810 Age-related osteoporosis without current pathological fracture: Secondary | ICD-10-CM

## 2016-09-15 NOTE — Patient Instructions (Signed)
Osteoporosis Osteoporosis is the thinning and loss of density in the bones. Osteoporosis makes the bones more brittle, fragile, and likely to break (fracture). Over time, osteoporosis can cause the bones to become so weak that they fracture after a simple fall. The bones most likely to fracture are the bones in the hip, wrist, and spine. What are the causes? The exact cause is not known. What increases the risk? Anyone can develop osteoporosis. You may be at greater risk if you have a family history of the condition or have poor nutrition. You may also have a higher risk if you are:  Female.  43 years old or older.  A smoker.  Not physically active.  White or Asian.  Slender.  What are the signs or symptoms? A fracture might be the first sign of the disease, especially if it results from a fall or injury that would not usually cause a bone to break. Other signs and symptoms include:  Low back and neck pain.  Stooped posture.  Height loss.  How is this diagnosed? To make a diagnosis, your health care provider may:  Take a medical history.  Perform a physical exam.  Order tests, such as: ? A bone mineral density test. ? A dual-energy X-ray absorptiometry test.  How is this treated? The goal of osteoporosis treatment is to strengthen your bones to reduce your risk of a fracture. Treatment may involve:  Making lifestyle changes, such as: ? Eating a diet rich in calcium. ? Doing weight-bearing and muscle-strengthening exercises. ? Stopping tobacco use. ? Limiting alcohol intake.  Taking medicine to slow the process of bone loss or to increase bone density.  Monitoring your levels of calcium and vitamin D.  Follow these instructions at home:  Include calcium and vitamin D in your diet. Calcium is important for bone health, and vitamin D helps the body absorb calcium.  Perform weight-bearing and muscle-strengthening exercises as directed by your health care  provider.  Do not use any tobacco products, including cigarettes, chewing tobacco, and electronic cigarettes. If you need help quitting, ask your health care provider.  Limit your alcohol intake.  Take medicines only as directed by your health care provider.  Keep all follow-up visits as directed by your health care provider. This is important.  Take precautions at home to lower your risk of falling, such as: ? Keeping rooms well lit and clutter free. ? Installing safety rails on stairs. ? Using rubber mats in the bathroom and other areas that are often wet or slippery. Get help right away if: You fall or injure yourself. This information is not intended to replace advice given to you by your health care provider. Make sure you discuss any questions you have with your health care provider. Document Released: 12/02/2004 Document Revised: 07/28/2015 Document Reviewed: 08/02/2013 Elsevier Interactive Patient Education  2017 [employer].  Alendronate tablets What is this medicine? ALENDRONATE (a LEN droe nate) slows calcium loss from bones. It helps to make normal healthy bone and to slow bone loss in people with Paget's disease and osteoporosis. It may be used in others at risk for bone loss. This medicine may be used for other purposes; ask your health care provider or pharmacist if you have questions. COMMON BRAND NAME(S): Fosamax What should I tell my health care provider before I take this medicine? They need to know if you have any of these conditions: -dental disease -esophagus, stomach, or intestine problems, like acid reflux or GERD -kidney disease -low  blood calcium -low vitamin D -problems sitting or standing 30 minutes -trouble swallowing -an unusual or allergic reaction to alendronate, other medicines, foods, dyes, or preservatives -pregnant or trying to get pregnant -breast-feeding How should I use this medicine? You must take this medicine exactly as directed or you  will lower the amount of the medicine you absorb into your body or you may cause yourself harm. Take this medicine by mouth first thing in the morning, after you are up for the day. Do not eat or drink anything before you take your medicine. Swallow the tablet with a full glass (6 to 8 fluid ounces) of plain water. Do not take this medicine with any other drink. Do not chew or crush the tablet. After taking this medicine, do not eat breakfast, drink, or take any medicines or vitamins for at least 30 minutes. Sit or stand up for at least 30 minutes after you take this medicine; do not lie down. Do not take your medicine more often than directed. Talk to your pediatrician regarding the use of this medicine in children. Special care may be needed. Overdosage: If you think you have taken too much of this medicine contact a poison control center or emergency room at once. NOTE: This medicine is only for you. Do not share this medicine with others. What if I miss a dose? If you miss a dose, do not take it later in the day. Continue your normal schedule starting the next morning. Do not take double or extra doses. What may interact with this medicine? -aluminum hydroxide -antacids -aspirin -calcium supplements -drugs for inflammation like ibuprofen, naproxen, and others -iron supplements -magnesium supplements -vitamins with minerals This list may not describe all possible interactions. Give your health care provider a list of all the medicines, herbs, non-prescription drugs, or dietary supplements you use. Also tell them if you smoke, drink alcohol, or use illegal drugs. Some items may interact with your medicine. What should I watch for while using this medicine? Visit your doctor or health care professional for regular checks ups. It may be some time before you see benefit from this medicine. Do not stop taking your medicine except on your doctor's advice. Your doctor or health care professional may  order blood tests and other tests to see how you are doing. You should make sure you get enough calcium and vitamin D while you are taking this medicine, unless your doctor tells you not to. Discuss the foods you eat and the vitamins you take with your health care professional. Some people who take this medicine have severe bone, joint, and/or muscle pain. This medicine may also increase your risk for a broken thigh bone. Tell your doctor right away if you have pain in your upper leg or groin. Tell your doctor if you have any pain that does not go away or that gets worse. This medicine can make you more sensitive to the sun. If you get a rash while taking this medicine, sunlight may cause the rash to get worse. Keep out of the sun. If you cannot avoid being in the sun, wear protective clothing and use sunscreen. Do not use sun lamps or tanning beds/booths. What side effects may I notice from receiving this medicine? Side effects that you should report to your doctor or health care professional as soon as possible: -allergic reactions like skin rash, itching or hives, swelling of the face, lips, or tongue -black or tarry stools -bone, muscle or joint pain -changes in  vision -chest pain -heartburn or stomach pain -jaw pain, especially after dental work -pain or trouble when swallowing -redness, blistering, peeling or loosening of the skin, including inside the mouth Side effects that usually do not require medical attention (report to your doctor or health care professional if they continue or are bothersome): -changes in taste -diarrhea or constipation -eye pain or itching -headache -nausea or vomiting -stomach gas or fullness This list may not describe all possible side effects. Call your doctor for medical advice about side effects. You may report side effects to FDA at 1-800-FDA-1088. Where should I keep my medicine? Keep out of the reach of children. Store at room temperature of 15 and 30  degrees C (59 and 86 degrees F). Throw away any unused medicine after the expiration date. NOTE: This sheet is a summary. It may not cover all possible information. If you have questions about this medicine, talk to your doctor, pharmacist, or health care provider.  2018 Elsevier/Gold Standard (2010-08-21 08:56:09)

## 2016-09-15 NOTE — Progress Notes (Signed)
GYNECOLOGY  VISIT   HPI: 61 y.o.   Married  Caucasian  female   [place] with Patient's last menstrual period was 07/06/1993.   here for BMD consult  -- Bone Density report in EPIC   BMD 09/03/16 at [facility]: T score of right hip -2.5. Spine not evaluated. On chart review, it appears she has taken Fosamax in the past, but not since May 2014.  She had taken it for about 5 years and stopped for a drug holiday.  No chronic diarrhea.  Has constipation.   Not a smoking.  Not using ETOH.   Has MS.  Doing liver function testing due to new medication for MS. Can fall from her MS.   One serving of dairy daily.  Supplements with calcium and vit D off and on.   Has problems with dry mouth.  Had a dental implant in the past.   FH of osteoporosis of both parents.   GYNECOLOGIC HISTORY: Patient's last menstrual period was 07/06/1993. Contraception:  Hysterectomy Menopausal hormone therapy:  none Last mammogram:  08/09/16 BIRADS 1 negative/density c Last pap smear:   TVH 1995        OB History    Gravida Para Term Preterm AB Living   1 1 1  0 0 1   SAB TAB Ectopic Multiple Live Births   0 0 0 0 1         Patient Active Problem List   Diagnosis Date Noted  . Sciatica, right side 06/17/2016  . Dysesthesia 10/30/2015  . Carpal tunnel syndrome on right 10/30/2015  . Sciatica of left side 07/16/2015  . Chronic ethmoidal sinusitis 06/26/2015  . High risk medication use 07/08/2014  . Chronic fatigue 04/02/2014  . Dysphagia 04/02/2014  . Ataxic gait 04/02/2014  . Vitamin D deficiency 06/08/2012  . OVERACTIVE BLADDER 08/01/2009  . OSTEOPOROSIS 08/01/2009  . Depression with anxiety 07/04/2007  . MULTIPLE SCLEROSIS 07/04/2007  . OSTEOARTHRITIS 07/04/2007  . HEADACHE 07/04/2007  . COLONIC POLYPS, HX OF 07/04/2007    Past Medical History:  Diagnosis Date  . Anxiety   . Bulging lumbar disc 1980  . Chronic pansinusitis   . Colon polyp   . Depression   . GERD  (gastroesophageal reflux disease)   . Gynecological examination    sees Dr. Elyse Hsu   . Headache(784.0)   . Multiple sclerosis ([place]) since 1992   [facility]. Arlice Colt [facility]  . Multiple thyroid nodules 1995   per pt. resolved on its own follwed by endo then released  . Neurogenic bladder disorder 12/2009   w/ [facility] stress [facility]. dx. Dr. McDiarmid  . Neuropathy   . Osteoarthritis   . Osteoporosis    last DEXA 08-28-07  . Overactive bladder    sees Dr. Nicki Reaper McDiarmid   . Pancreatitis 1977  . Ulcerative proctitis ([place])   . Vision abnormalities     Past Surgical History:  Procedure Laterality Date  . APPENDECTOMY    . breast ruptured  left   ductal cyst  . CHOLECYSTECTOMY    . COLONOSCOPY  01/10/09   repeat in 5 yrs Dr. Wilford Corner  . DILATION AND CURETTAGE OF UTERUS    . LAPAROSCOPY     x 3  . left knee surgery  1978   to remove bone tumor    . NASAL SINUS SURGERY Bilateral 01/13/2015   Procedure: ENDOSCOPIC SINUS SURGERY;  Surgeon: Izora Gala, MD;  Location: [facility];  Service:  ENT;  Laterality: Bilateral;  . NASAL TURBINATE REDUCTION    . pubo vaginal sling  10/2003  . TONSILLECTOMY    . vaginal cystocele repair     anterior  . VAGINAL HYSTERECTOMY  1995   AUB & Endometriosis  . widom teeth removal      [facility] Prescriptions  Medication Sig Dispense Refill  . acetaminophen (TYLENOL) 500 MG tablet Take 500 mg by mouth 4 (four) times daily.    Marland Kitchen amphetamine-dextroamphetamine (ADDERALL) 20 MG tablet Take 1 tablet twice daily as needed. 60 tablet 0  . Calcium Carb-Cholecalciferol (CALCIUM 600 + D PO) Take 1 tablet by mouth daily.    . cetirizine (ZYRTEC) 10 MG tablet Take 10 mg by mouth.    . clonazePAM (KLONOPIN) 1 MG tablet TAKE 1 TABLET 3 TIMES A DAY AS NEEDED 90 tablet 2  . DULoxetine (CYMBALTA) 60 MG capsule Take 1 capsule (60 mg total) by mouth daily. 90 capsule 3  . fentaNYL (DURAGESIC) 25 MCG/HR patch Place 1 patch (25  mcg total) onto the skin every 3 (three) days. 10 patch 0  . fesoterodine (TOVIAZ) 4 MG TB24 tablet Take 4 mg by mouth daily.    Marland Kitchen gabapentin (NEURONTIN) 600 MG tablet Take 1 tablet (600 mg total) by mouth 4 (four) times daily. 360 tablet 3  . imipramine (TOFRANIL) 25 MG tablet Take 2 tablets at bedtime. 180 tablet 3  . leflunomide (ARAVA) 20 MG tablet Take one-half tablet daily for 2 weeks, then increase to one whole tablet daily. 90 tablet 3  . methocarbamol (ROBAXIN) 500 MG tablet Take 1 tablet (500 mg total) by mouth every 8 (eight) hours as needed for muscle spasms. 90 tablet 5  . Multiple Vitamin (MULTIVITAMIN) tablet Take 1 tablet by mouth daily.    . traMADol (ULTRAM) 50 MG tablet Take 1 tablet (50 mg total) by mouth every 6 (six) hours as needed. 120 tablet 5   No [facility] facility-administered medications for this visit.      ALLERGIES: Codeine; Demerol [meperidine]; Ivp dye [iodinated diagnostic agents]; Penicillins; Stadol [butorphanol]; Sulfonamide derivatives; and Gadolinium derivatives  Family History  Problem Relation Age of Onset  . Osteoporosis Mother        severe  . Cancer Mother 64       colon /Breast also m. niece at 20's  . Breast cancer Mother   . Osteopenia Sister        X 2  . Breast cancer Other     Social History   Social History  . Marital status: Married    Spouse name: Jori Moll  . Number of children: 1  . Years of education: 71   Occupational History  . 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 Smoking status: Former Smoker    Packs/day: 1.00    Years: 5.00    Types: Cigarettes    Quit date: 03/09/1979  . Smokeless tobacco: Never Used  . Alcohol use No  . Drug use: No  . Sexual activity: Not Currently    Partners: Male    Birth control/ protection: Surgical   Other Topics Concern  . Not on file   Social History Narrative  . No narrative on file    ROS:  Pertinent items are noted in HPI.  PHYSICAL  EXAMINATION:    BP 118/60 (BP Location: Right Arm, Patient Position: Sitting, Cuff Size: Normal)   Pulse 80   Resp 16  Wt 131 lb (59.4 kg)   LMP 07/06/1993   BMI 21.31 kg/m     General appearance: alert, cooperative and appears stated age  ASSESSMENT  Osteoporosis. FH breast cancer.  Risk of fall due to MS.  PLAN  We discussed osteoporosis - risk factors, diagnosis, and treatment options.  We did acknowledge that the machine used to measure her BMD this year is different from the study done in 2013.  We reviewed bisphosphonates, Evista, Prolia, and Forteo. We discussed fall risk reduction.  Will check PTH, Calcium, and Vit D. Discussed importance of Ca 1200 mg daily and vit D 800 IU daily. Will likely plan for monthly Actonel after labs back.  Patient takes a lot of medication, and it will help if she does not have to alter her medication schedule significantly.  Questions invited and answered.   An After Visit Summary was printed and given to the patient.  _25_____ minutes face to face time of which over 50% was spent in counseling.

## 2016-09-16 ENCOUNTER — Telehealth: Payer: Self-pay

## 2016-09-16 ENCOUNTER — Encounter: Payer: Self-pay | Admitting: Obstetrics and Gynecology

## 2016-09-16 LAB — PTH, INTACT AND CALCIUM
Calcium: 10.1 mg/dL (ref 8.7–10.3)
PTH: 16 pg/mL (ref 15–65)

## 2016-09-16 LAB — VITAMIN D 25 HYDROXY (VIT D DEFICIENCY, FRACTURES): Vit D, 25-Hydroxy: 38.4 ng/mL (ref 30.0–100.0)

## 2016-09-16 MED ORDER — RISEDRONATE SODIUM 150 MG PO TABS: 150.0000 mg | ORAL_TABLET | ORAL | 2 refills | Status: DC

## 2016-09-16 NOTE — Telephone Encounter (Signed)
Spoke with patient. Advised of results as seen below from [facility]. Patient verbalizes understanding. Rx for Actonel 150 mg Take 1 tablet (150 mg total) by mouth every 30 (thirty) days. with water on empty stomach, nothing by mouth or lie down for next 30 minutes #3 2RF sent to pharmacy on file. Aex scheduled for 05/26/2017 at 2 pm with Dr.Silva. Patient is agreeable to date and time.  Will close encounter.

## 2016-09-16 NOTE — Telephone Encounter (Signed)
-----   Message from Nunzio Cobbs, MD sent at 09/16/2016 12:38 PM EDT ----- Please inform patient of her normal vitamin D, calcium, and PTH level.  She can start the Actonel 150 mg orally once per month.  She will need to take this in the morning on an empty stomach with a full glass of water.  She will need to remain upright for 30 minutes after taking the medication.  Ok to give refills until March 2019 when she is due for an annual exam.  (She took Fosamax in the past and did well, so I assume she will do well with this.)  I would have her go ahead and schedule this annual exam appointment.  I told her yesterday that Patty retired.  I am happy to see her back for her annual exam appointment.

## 2016-10-04 ENCOUNTER — Telehealth: Payer: Self-pay | Admitting: Neurology

## 2016-10-04 DIAGNOSIS — H5213 Myopia, bilateral: Secondary | ICD-10-CM | POA: Diagnosis not present

## 2016-10-04 DIAGNOSIS — H2513 Age-related nuclear cataract, bilateral: Secondary | ICD-10-CM | POA: Diagnosis not present

## 2016-10-04 MED ORDER — FENTANYL 25 MCG/HR TD PT72: 25.0000 ug | MEDICATED_PATCH | TRANSDERMAL | 0 refills | Status: DC

## 2016-10-04 NOTE — Telephone Encounter (Signed)
Rx. up front [facility]/fim 

## 2016-10-04 NOTE — Telephone Encounter (Signed)
Rx. awaiting RAS sig/fim 

## 2016-10-04 NOTE — Addendum Note (Signed)
Addended by: France Ravens I on: 10/04/2016 09:20 AM   Modules accepted: Orders

## 2016-10-04 NOTE — Telephone Encounter (Signed)
Patient requesting refill of fentaNYL (DURAGESIC) 25 MCG/HR patch.

## 2016-10-07 ENCOUNTER — Ambulatory Visit: Payer: PPO | Admitting: Neurology

## 2016-11-01 ENCOUNTER — Other Ambulatory Visit: Payer: Self-pay | Admitting: Neurology

## 2016-11-09 ENCOUNTER — Telehealth: Payer: Self-pay | Admitting: Neurology

## 2016-11-09 ENCOUNTER — Other Ambulatory Visit: Payer: Self-pay | Admitting: *Deleted

## 2016-11-09 DIAGNOSIS — G894 Chronic pain syndrome: Secondary | ICD-10-CM

## 2016-11-09 DIAGNOSIS — Z79899 Other long term (current) drug therapy: Secondary | ICD-10-CM

## 2016-11-09 MED ORDER — AMPHETAMINE-DEXTROAMPHETAMINE 20 MG PO TABS: ORAL_TABLET | ORAL | 0 refills | Status: DC

## 2016-11-09 MED ORDER — FENTANYL 25 MCG/HR TD PT72: 25.0000 ug | MEDICATED_PATCH | TRANSDERMAL | 0 refills | Status: DC

## 2016-11-09 NOTE — Telephone Encounter (Signed)
Rx's awaiting RAS sig/fim 

## 2016-11-09 NOTE — Telephone Encounter (Signed)
Rx's up front [facility]/fim 

## 2016-11-09 NOTE — Telephone Encounter (Signed)
Pt request refill for amphetamine-dextroamphetamine (ADDERALL) 20 MG tablet and fentaNYL (DURAGESIC) 25 MCG/HR patch

## 2016-11-12 ENCOUNTER — Other Ambulatory Visit (INDEPENDENT_AMBULATORY_CARE_PROVIDER_SITE_OTHER): Payer: Self-pay

## 2016-11-12 DIAGNOSIS — Z79899 Other long term (current) drug therapy: Secondary | ICD-10-CM

## 2016-11-12 DIAGNOSIS — Z0289 Encounter for other administrative examinations: Secondary | ICD-10-CM

## 2016-11-12 DIAGNOSIS — G894 Chronic pain syndrome: Secondary | ICD-10-CM

## 2016-11-13 LAB — MED LIST OPTION NOT SELECTED

## 2016-11-17 LAB — DRUG SCREEN, UR (12+OXYCODONE+CRT)
Amphetamine Scrn, Ur: POSITIVE — AB
BARBITURATE SCREEN URINE: NEGATIVE ng/mL
BENZODIAZEPINE SCREEN, URINE: NEGATIVE ng/mL
CANNABINOIDS UR QL SCN: NEGATIVE ng/mL
Cocaine (Metab) Scrn, Ur: NEGATIVE ng/mL
Creatinine(Crt), U: 124.4 mg/dL (ref 20.0–300.0)
FENTANYL, URINE: POSITIVE — AB
Meperidine Screen, Urine: NEGATIVE ng/mL
Methadone Screen, Urine: NEGATIVE ng/mL
OXYCODONE+OXYMORPHONE UR QL SCN: NEGATIVE ng/mL
Opiate Scrn, Ur: NEGATIVE ng/mL
PH UR, DRUG SCRN: 6.3 (ref 4.5–8.9)
PROPOXYPHENE SCREEN URINE: NEGATIVE ng/mL
Phencyclidine Qn, Ur: NEGATIVE ng/mL
SPECIFIC GRAVITY: 1.014
Tramadol Screen, Urine: POSITIVE — AB

## 2016-11-26 DIAGNOSIS — M25562 Pain in left knee: Secondary | ICD-10-CM | POA: Diagnosis not present

## 2016-11-26 DIAGNOSIS — M1712 Unilateral primary osteoarthritis, left knee: Secondary | ICD-10-CM | POA: Diagnosis not present

## 2016-12-07 ENCOUNTER — Telehealth: Payer: Self-pay | Admitting: *Deleted

## 2016-12-07 ENCOUNTER — Other Ambulatory Visit: Payer: Self-pay | Admitting: Neurology

## 2016-12-07 ENCOUNTER — Telehealth: Payer: Self-pay | Admitting: Neurology

## 2016-12-07 MED ORDER — FENTANYL 25 MCG/HR TD PT72: 25.0000 ug | MEDICATED_PATCH | TRANSDERMAL | 0 refills | Status: DC

## 2016-12-07 NOTE — Addendum Note (Signed)
Addended by: France Ravens I on: 12/07/2016 09:16 AM   Modules accepted: Orders

## 2016-12-07 NOTE — Telephone Encounter (Signed)
Fentanyl rx. up front [facility]/fim 

## 2016-12-07 NOTE — Telephone Encounter (Signed)
Pt calling for refill of fentaNYL (DURAGESIC) 25 MCG/HR patch

## 2016-12-07 NOTE — Telephone Encounter (Signed)
Rx. up front [facility]/fim 

## 2016-12-07 NOTE — Telephone Encounter (Signed)
Rx. awaiting RAS sig/fim 

## 2016-12-08 ENCOUNTER — Telehealth: Payer: Self-pay | Admitting: Obstetrics and Gynecology

## 2016-12-08 DIAGNOSIS — Z1389 Encounter for screening for other disorder: Secondary | ICD-10-CM | POA: Diagnosis not present

## 2016-12-08 DIAGNOSIS — M1712 Unilateral primary osteoarthritis, left knee: Secondary | ICD-10-CM | POA: Diagnosis not present

## 2016-12-08 DIAGNOSIS — F325 Major depressive disorder, single episode, in full remission: Secondary | ICD-10-CM | POA: Diagnosis not present

## 2016-12-08 DIAGNOSIS — Z0000 Encounter for general adult medical examination without abnormal findings: Secondary | ICD-10-CM | POA: Diagnosis not present

## 2016-12-08 DIAGNOSIS — N319 Neuromuscular dysfunction of bladder, unspecified: Secondary | ICD-10-CM | POA: Diagnosis not present

## 2016-12-08 DIAGNOSIS — G2581 Restless legs syndrome: Secondary | ICD-10-CM | POA: Diagnosis not present

## 2016-12-08 DIAGNOSIS — G35 Multiple sclerosis: Secondary | ICD-10-CM | POA: Diagnosis not present

## 2016-12-08 DIAGNOSIS — Z23 Encounter for immunization: Secondary | ICD-10-CM | POA: Diagnosis not present

## 2016-12-08 NOTE — Telephone Encounter (Signed)
Spoke with patient. Reports taking Actonel monthly, last dose 11/22/16.   Reports "burning in the back of throat and esophagus", last for up to 3 weeks.   Reviewed admin instructions with patient, states she has been taking with water on empty stomach and nothing by mouth or lying down for at least 30 min after.   Patient states she believes she has taken Actonel in the past and this is why she stopped. Are there oral alternatives?   Advised patient would review with Dr. Quincy Simmonds and return call with recommendations. Patient is agreeable.   Dr. Quincy Simmonds -please advise on oral alternative?silv

## 2016-12-08 NOTE — Telephone Encounter (Signed)
Patient would like to speak to a nurse regarding a medication giving her heartburn.

## 2016-12-09 MED ORDER — DENOSUMAB 60 MG/ML ~~LOC~~ SOLN: 60.0000 mg | Freq: Once | SUBCUTANEOUS | 1 refills | Status: AC

## 2016-12-09 NOTE — Telephone Encounter (Signed)
Ok to [employer].  No additional labs needed.

## 2016-12-09 NOTE — Telephone Encounter (Signed)
Options are:  - Foxamax 70 mg weekly.  She told me at her office visit that this is what she took in the past. This may also cause reflux symptoms.   - Prolia injection every 6 months.  This is an immune modulator that works better then Actonel and Fosamax.  Reflux is not a common symptom.  She may be at increased risk for infection, like UTIs, with this medication.

## 2016-12-09 NOTE — Telephone Encounter (Signed)
Order placed for Prolia 60mg /ml inject into the skin once in upper arm, thigh or abdomen. #1/1RF to specialty pharmacy [facility].   Routing to benefits department for precert.  Cc: Dr. Quincy Simmonds

## 2016-12-09 NOTE — Telephone Encounter (Signed)
Spoke with patient, advised as seen below per Dr. Quincy Simmonds. Patient would like to proceed with Prolia.   Advised patient Prolia has to be precerted through insurance, will be notified of coverage, if agreeable, can them proceed with scheduling shipment and injection.  Patient verbalizes understanding and is agreeable.  Dr. Quincy Simmonds -ok to proceed with Prolia precert?  Last Vit D, calcium and PTH on 09/15/16, normal. Any additional labs?

## 2016-12-09 NOTE — Telephone Encounter (Signed)
Order signed by me

## 2016-12-15 ENCOUNTER — Ambulatory Visit: Payer: PPO | Admitting: Neurology

## 2016-12-16 ENCOUNTER — Telehealth: Payer: Self-pay | Admitting: Neurology

## 2016-12-16 ENCOUNTER — Encounter: Payer: Self-pay | Admitting: Neurology

## 2016-12-16 NOTE — Telephone Encounter (Signed)
Pt called manufacturer has a shortage with methocarbamol (ROBAXIN) 500 MG tablet . She is wanting to try something different since this has happened before. Please call to discuss

## 2016-12-20 MED ORDER — TIZANIDINE HCL 2 MG PO TABS: 2.0000 mg | ORAL_TABLET | Freq: Three times a day (TID) | ORAL | 3 refills | Status: DC

## 2016-12-20 NOTE — Telephone Encounter (Signed)
LMOM (identified vm) that per RAS, ok for Tizanidine 2mg  po tid, in place of Robaxin.  Rx. escribed to [facility].  Pt. to call if she has questions/fim

## 2016-12-20 NOTE — Telephone Encounter (Signed)
I have spoken with Sheri Burns this afternoon and per RAS, explained it is ok to take Prolia for osteoporosis/fim

## 2016-12-20 NOTE — Telephone Encounter (Signed)
Spoke with patient, calling to notify of approval of Prolia received from [facility].   Patient states she has been contacted by specialty pharmacy.  Patient has concerns about prolia that she would like to discuss with neurologist before making final decision, is awaiting return call.   Advised patient to update [facility] of decision, will update Dr. Quincy Simmonds. Return call to office at [phone] with any additional questions. Patient is agreeable.   Letter from [facility] to scan.  Routing to provider for final review. Patient is agreeable to disposition. Will close encounter.

## 2016-12-20 NOTE — Telephone Encounter (Signed)
Patient returned your call and I made her aware of the message regarding the Tizanidine. She said that she forgot to ask you about the Prolia shot and if Dr. Felecia Shelling is ok with her starting on that for osteoporosis?

## 2016-12-28 ENCOUNTER — Telehealth: Payer: Self-pay | Admitting: Obstetrics and Gynecology

## 2016-12-28 NOTE — Telephone Encounter (Signed)
Pamala Hurry with [facility] calling to speak with nurse about patient's prolia. Patient requesting delivery to her home so her daughter who is a Designer, jewellery can give to her. Pharmacy phone number [phone].

## 2016-12-29 DIAGNOSIS — M1712 Unilateral primary osteoarthritis, left knee: Secondary | ICD-10-CM | POA: Diagnosis not present

## 2016-12-30 NOTE — Telephone Encounter (Signed)
Ovid Curd at [facility] calling asking if Prolia could be mailed to the patient's home?

## 2016-12-30 NOTE — Telephone Encounter (Signed)
Routing to Lorella Nimrod, Therapist, sports.

## 2016-12-31 NOTE — Telephone Encounter (Signed)
Spoke with patient, advised reviewed request to receive prolia in home by daughter who is a Designer, jewellery with Dr. Quincy Simmonds and Clinical nursing supervisor. [facility] RX for prolia ordered by Dr. Quincy Simmonds, would need to be given in our office. Prolia would need to be given in office to allow for appropriate monitoring after recieveing injection and documentation. Reviewed options of patient receiving prolia in daughters office, prolia would need to be ordered by a provider at that location.   Patient states Prolia has already been shipped and scheduled to be delivered to her home on 10/27. Patient states she wanted the injection given at home to avoid OV copay.   Advised patient would need to update nursing supervisor and Dr. Quincy Simmonds and return call, patient verbalizes understanding.  Call placed to [facility], spoke with pharmacist Ovid Curd. Was advised Prolia has been shipped to patients home.   Routing to Lamont Snowball, RN

## 2016-12-31 NOTE — Telephone Encounter (Signed)
Call to patient. Reviewed recommendation for medication to be administered in office.  Medication prescribed by Dr Quincy Simmonds needs to be administered and documented here and given with MD available.  Patient states she has received so many self administered medications for MS she did not expect this to be an issue. Agreeable to come into office. Appointment scheduled for 01-06-17 at [facility] to provider for final review. Patient agreeable to disposition. Will close encounter.

## 2017-01-05 ENCOUNTER — Telehealth: Payer: Self-pay | Admitting: Neurology

## 2017-01-05 DIAGNOSIS — M1712 Unilateral primary osteoarthritis, left knee: Secondary | ICD-10-CM | POA: Diagnosis not present

## 2017-01-05 MED ORDER — FENTANYL 25 MCG/HR TD PT72: 25.0000 ug | MEDICATED_PATCH | TRANSDERMAL | 0 refills | Status: DC

## 2017-01-05 MED ORDER — AMPHETAMINE-DEXTROAMPHETAMINE 20 MG PO TABS: ORAL_TABLET | ORAL | 0 refills | Status: DC

## 2017-01-05 NOTE — Telephone Encounter (Signed)
Patient requesting refill of amphetamine-dextroamphetamine (ADDERALL) 20 MG tablet and fentaNYL (DURAGESIC) 25 MCG/HR patch.

## 2017-01-05 NOTE — Telephone Encounter (Signed)
Rx's awaiting RAS sig/fim 

## 2017-01-05 NOTE — Addendum Note (Signed)
Addended by: France Ravens I on: 01/05/2017 11:56 AM   Modules accepted: Orders

## 2017-01-05 NOTE — Telephone Encounter (Signed)
Rx's up front [facility]/fim 

## 2017-01-06 ENCOUNTER — Ambulatory Visit (INDEPENDENT_AMBULATORY_CARE_PROVIDER_SITE_OTHER): Payer: PPO | Admitting: *Deleted

## 2017-01-06 VITALS — BP 120/60 | HR 92 | Ht 65.75 in | Wt 131.0 lb

## 2017-01-06 DIAGNOSIS — M810 Age-related osteoporosis without current pathological fracture: Secondary | ICD-10-CM

## 2017-01-06 MED ORDER — DENOSUMAB 60 MG/ML ~~LOC~~ SOLN
60.0000 mg | Freq: Once | SUBCUTANEOUS | Status: AC
  Administered 2017-01-06: 60 mg via SUBCUTANEOUS

## 2017-01-06 NOTE — Progress Notes (Signed)
Patient in today for first prolia injection. Patient tolerated shot well. Waited in the office for over 10 minutes and had no reaction.  Advised to call in 6 months to set up for next injection. Patient verbalized understanding.

## 2017-01-12 DIAGNOSIS — M1712 Unilateral primary osteoarthritis, left knee: Secondary | ICD-10-CM | POA: Diagnosis not present

## 2017-02-07 ENCOUNTER — Other Ambulatory Visit: Payer: Self-pay

## 2017-02-07 ENCOUNTER — Ambulatory Visit: Payer: PPO | Admitting: Neurology

## 2017-02-07 ENCOUNTER — Encounter: Payer: Self-pay | Admitting: Neurology

## 2017-02-07 VITALS — BP 136/66 | HR 101 | Resp 16 | Ht 65.75 in | Wt 131.0 lb

## 2017-02-07 DIAGNOSIS — R260 Ataxic gait: Secondary | ICD-10-CM

## 2017-02-07 DIAGNOSIS — G35 Multiple sclerosis: Secondary | ICD-10-CM | POA: Diagnosis not present

## 2017-02-07 DIAGNOSIS — R5382 Chronic fatigue, unspecified: Secondary | ICD-10-CM

## 2017-02-07 DIAGNOSIS — N318 Other neuromuscular dysfunction of bladder: Secondary | ICD-10-CM | POA: Diagnosis not present

## 2017-02-07 DIAGNOSIS — F418 Other specified anxiety disorders: Secondary | ICD-10-CM

## 2017-02-07 DIAGNOSIS — Z79899 Other long term (current) drug therapy: Secondary | ICD-10-CM | POA: Diagnosis not present

## 2017-02-07 DIAGNOSIS — M5432 Sciatica, left side: Secondary | ICD-10-CM | POA: Diagnosis not present

## 2017-02-07 MED ORDER — AMPHETAMINE-DEXTROAMPHETAMINE 20 MG PO TABS: ORAL_TABLET | ORAL | 0 refills | Status: DC

## 2017-02-07 MED ORDER — FENTANYL 25 MCG/HR TD PT72: 25.0000 ug | MEDICATED_PATCH | TRANSDERMAL | 0 refills | Status: DC

## 2017-02-07 NOTE — Progress Notes (Addendum)
[facility]  PATIENT: Sheri Burns DOB: 03/13/1955  REFERRING CLINICIAN: Thressa Sheller  HISTORY FROM: Patient REASON FOR VISIT: MS   HISTORICAL  CHIEF COMPLAINT:  Chief Complaint  Patient presents with  . Multiple Sclerosis    Sts. she continues to tolerate Arava well.  Actonel was switched to Prolia.    HISTORY OF PRESENT ILLNESS:  Sheri Burns is a 61 year old woman with MS.     Update 02/07/2017:    Her MS is stable.   She tolerates the Leflunomide well.   She has not had any exacerbation on it.      She feels her gait is stable. At times her legs are tired and balance can be slightly off but she has not had any falls. There will be some numbness and tingling in the left leg  She is still having left leg pain.   It radiates from the buttock into the left leg to the foot (top > bottom).   She denies weakness.  No bladder changes.    Years ago, she had a couple ESI's with benefit.   She got 2 month benefit from a piriformis muscle injection at the last visit.  Fentanyl helps the pain.   Mood is stable. She notes fatigue and attentional deficits, both help with Adderall.   From 08/12/2016: Pain:   She reports new pain in the left buttock that radiates down the leg. She also has numbness in the left anterolateral thigh and some pain in the lateral groin region.   In the past, she had similar pain in the right buttock that improved after trigger point injection.  MS:   Because her Aubagio co-pay was very high, I placed her on leflunomide. She is tolerating it well and has had no MS exacerbation.  Her last MRI of the brain was some are 2016 and we discussed getting another one to determine if she is having any subclinical activity that would make [place] consider a different medication.  Gait/strength/sensation: She reports that her gait is stable. She still notes mild weakness in her legs and mild problems with her balance. She notes numbness and sometimes  painful tingling in the left leg.  Bladder:   She denies any change in her bladder.   Fatigue: She continues to report difficulties with physical and cognitive fatigue. This is improved with Adderall  MS History:  She was diagnosed with multiple sclerosis more than 20 years ago. Initially, she was treated with Betaseron then Copaxone but has not been on any disease modifying therapy for about 8 years. During this time, she has had occasional  Exacerbations with more fatigue and gait issues.  She also has noted mild cognitive dysfunction worsening over the past 5 or 6 years. Her MRI of the brain performed 08/31/2012 shows foci that are predominantly periventricular. They are consistent with the diagnosis of multiple sclerosis. When this MRI was compared to an MRI dated 10/12/2010, there was one additional small periventricular focus. There were no acute findings on either MRI.   In November 2015, she had an exacerbation with severe fatigue, worsened gait and dysphagia.    She started Aubagio 09/09/14.        REVIEW OF SYSTEMS:  Constitutional: No fevers, chills, sweats, or change in appetite.  She notes a lot of fatigue. She is sleeping well. Eyes: No visual changes, double vision, eye pain Ear, nose and throat: No hearing loss, ear pain, nasal congestion, sore throat Cardiovascular: No chest pain,  palpitations Respiratory:  No shortness of breath at rest or with exertion.   No wheezes GastrointestinaI: No nausea, vomiting, diarrhea.  Has constipation Genitourinary:  see above. Musculoskeletal:  reports pain in knees.   Reports neck pain and back pain Integumentary: No rash, pruritus, skin lesions Neurological: as above Psychiatric: Depression and  Anxiety noted Endocrine: No palpitations, diaphoresis, change in appetite, change in weigh or increased thirst Hematologic/Lymphatic:  No anemia, purpura, petechiae. Allergic/Immunologic: No itchy/runny eyes, nasal congestion, recent allergic  reactions, rashes  ALLERGIES: Allergies  Allergen Reactions  . Codeine Nausea And Vomiting    vomiting  . Demerol [Meperidine] Nausea And Vomiting  . Ivp Dye [Iodinated Diagnostic Agents]   . Penicillins   . Stadol [Butorphanol] Other (See Comments)    Per patient psychotic event  . Sulfonamide Derivatives Hives  . Gadolinium Derivatives Hives    HOME MEDICATIONS: Outpatient Medications Prior to Visit  Medication Sig Dispense Refill  . acetaminophen (TYLENOL) 500 MG tablet Take 500 mg by mouth 4 (four) times daily.    . Calcium Carb-Cholecalciferol (CALCIUM 600 + D PO) Take 1 tablet by mouth daily.    . cetirizine (ZYRTEC) 10 MG tablet Take 10 mg by mouth.    . clonazePAM (KLONOPIN) 1 MG tablet TAKE 1 TABLET 3 TIMES A DAY AS NEEDED 90 tablet 2  . DULoxetine (CYMBALTA) 60 MG capsule Take 1 capsule (60 mg total) by mouth daily. 90 capsule 3  . fesoterodine (TOVIAZ) 4 MG TB24 tablet Take 4 mg by mouth daily.    Marland Kitchen gabapentin (NEURONTIN) 600 MG tablet Take 1 tablet (600 mg total) by mouth 4 (four) times daily. 360 tablet 3  . imipramine (TOFRANIL) 25 MG tablet Take 2 tablets at bedtime. 180 tablet 3  . leflunomide (ARAVA) 20 MG tablet Take one-half tablet daily for 2 weeks, then increase to one whole tablet daily. 90 tablet 3  . Multiple Vitamin (MULTIVITAMIN) tablet Take 1 tablet by mouth daily.    Marland Kitchen tiZANidine (ZANAFLEX) 2 MG tablet Take 1 tablet (2 mg total) by mouth 3 (three) times daily. 90 tablet 3  . traMADol (ULTRAM) 50 MG tablet TAKE 1 TABLET BY MOUTH EVERY 6 HOURS AS NEEDED 120 tablet 5  . amphetamine-dextroamphetamine (ADDERALL) 20 MG tablet Take 1 tablet twice daily as needed. 60 tablet 0  . fentaNYL (DURAGESIC) 25 MCG/HR patch Place 1 patch (25 mcg total) onto the skin every 3 (three) days. 10 patch 0  . risedronate (ACTONEL) 150 MG tablet Take 1 tablet (150 mg total) by mouth every 30 (thirty) days. with water on empty stomach, nothing by mouth or lie down for next 30  minutes. (Patient not taking: Reported on 02/07/2017) 3 tablet 2   No facility-administered medications prior to visit.     PAST MEDICAL HISTORY: Past Medical History:  Diagnosis Date  . Anxiety   . Bulging lumbar disc 1980  . Chronic pansinusitis   . Colon polyp   . Depression   . GERD (gastroesophageal reflux disease)   . Gynecological examination    sees Dr. Elyse Hsu   . Headache(784.0)   . Multiple sclerosis ([place]) since 1992   Dr. Arlice Colt in [facility]  . Multiple thyroid nodules 1995   per pt. resolved on its own follwed by endo then released  . Neurogenic bladder disorder 12/2009   w/ [facility] dx. Dr. McDiarmid  . Neuropathy   . Osteoarthritis   . Osteoporosis    last DEXA 2018  .  Overactive bladder    sees Dr. Nicki Reaper McDiarmid   . Pancreatitis 1977  . Ulcerative proctitis ([place])   . Vision abnormalities     PAST SURGICAL HISTORY: Past Surgical History:  Procedure Laterality Date  . APPENDECTOMY    . breast ruptured  left   ductal cyst  . CHOLECYSTECTOMY    . COLONOSCOPY  01/10/09   repeat in 5 yrs Dr. Wilford Corner  . DILATION AND CURETTAGE OF UTERUS    . LAPAROSCOPY     x 3  . left knee surgery  1978   to remove bone tumor    . NASAL SINUS SURGERY Bilateral 01/13/2015   Procedure: ENDOSCOPIC SINUS SURGERY;  Surgeon: Izora Gala, MD;  Location: [facility];  Service: ENT;  Laterality: Bilateral;  . NASAL TURBINATE REDUCTION    . pubo vaginal sling  10/2003  . TONSILLECTOMY    . vaginal cystocele repair     anterior  . VAGINAL HYSTERECTOMY  1995   AUB & Endometriosis  . widom teeth removal      FAMILY HISTORY: Family History  Problem Relation Age of Onset  . Osteoporosis Mother        severe  . Cancer Mother 16       colon /Breast also m. niece at 72's  . Breast cancer Mother   . Osteopenia Sister        X 2  . Breast cancer Other     SOCIAL HISTORY:  Social History   Socioeconomic History  . Marital  status: Married    Spouse name: Jori Moll  . Number of children: 1  . Years of education: 54  . Highest education level: Not on file  Social Needs  . Financial resource strain: Not on file  . Food insecurity - worry: Not on file  . Food insecurity - inability: Not on file  . Transportation needs - medical: Not on file  . Transportation needs - non-medical: Not on file  Occupational History  . Occupation: Nurse, learning disability: UNEMPLOYED  . Occupation: [employer], occupational  Tobacco Use  . Smoking status: Former Smoker    Packs/day: 1.00    Years: 5.00    Pack years: 5.00    Types: Cigarettes    Last attempt to quit: 03/09/1979    Years since quitting: 37.9  . Smokeless tobacco: Never Used  Substance and Sexual Activity  . Alcohol use: No  . Drug use: No  . Sexual activity: Not Currently    Partners: Male    Birth control/protection: Surgical  Other Topics Concern  . Not on file  Social History Narrative  . Not on file     PHYSICAL EXAM  Vitals:   02/07/17 1105  BP: 136/66  Pulse: (!) 101  Resp: 16  Weight: 131 lb (59.4 kg)  Height: 5' 5.75" (1.67 m)    Body mass index is 21.31 kg/m.   General: The patient is well-developed and well-nourished and in no acute distress.      musculoskeletal:  She has tenderness over the left greater than right lower lumbar paraspinal muscles on the left piriformis muscle.   Neurologic Exam  Mental status: The patient is alert and oriented x 3 at the time of the examination. The patient has apparent normal recent and remote memory, with an apparently normal attention span and concentration ability.   Speech is normal.  Cranial nerves: Extraocular movements are full. Facial strength and sensation is normal. Trapezius  strength is normal.   No obvious hearing deficits are noted.  Motor:  Muscle bulk is normal and tone is increased in legs. Strength is  5 / 5 in all 4 extremities.   Sensory: Sensory testing shows decreased sensation  to touch, temperature and vibration in the left foot. Additionally there is decreased sensation to touch in the distribution of the left lateral femoral cutaneous nerve.  Coordination: Cerebellar testing reveals slightly reduced heel-to-shin bilaterally.  Gait and station: Station is stable. The gait is mildly wide. Tandem gait is moderately wide. Romberg is negative.   Reflexes: Deep tendon reflexes are symmetric and fairly normal in the arms. However, she has increased reflexes in the legs with spread at the knees and a couple beats of nonsustained clonus in the ankles...     DIAGNOSTIC DATA (LABS, IMAGING, TESTING) - I reviewed patient records, labs, notes, testing and imaging myself where available.      ASSESSMENT AND PLAN  MULTIPLE SCLEROSIS  Ataxic gait  High risk medication use  Chronic fatigue  OVERACTIVE BLADDER  Depression with anxiety  Sciatica of left side  1.   Continue Leflunomide     she is getting lab work with her other doctors..    2.   Renew Adderall  IR.  Renew Fentanyl patch 3.  Trigger point injection of the left piriformis muscle and left L2-L3 and L4-L5 paraspinal muscles. paraspinal muscles with 80 mg Depo-Medrol in 5 mL Marcaine. She tolerated the procedure well and there were no complications.  4.   rtc 4 months, sooner if worse  Aurilla Coulibaly A. Felecia Shelling, MD, PhD 33/04/9516, 8:41 PM Certified in Neurology, Clinical Neurophysiology, Sleep Medicine, Pain Medicine and Neuroimaging  [facility] [address] [place], [place] [address] [phone]

## 2017-02-07 NOTE — Patient Instructions (Signed)
Look on [email or web address] for PIRIFORMIS MUSCLE exercises and stretched

## 2017-02-18 DIAGNOSIS — N3946 Mixed incontinence: Secondary | ICD-10-CM | POA: Diagnosis not present

## 2017-02-18 DIAGNOSIS — R350 Frequency of micturition: Secondary | ICD-10-CM | POA: Diagnosis not present

## 2017-02-21 ENCOUNTER — Other Ambulatory Visit: Payer: Self-pay | Admitting: Neurology

## 2017-03-02 ENCOUNTER — Telehealth: Payer: Self-pay | Admitting: Neurology

## 2017-03-02 MED ORDER — FENTANYL 25 MCG/HR TD PT72: 25.0000 ug | MEDICATED_PATCH | TRANSDERMAL | 0 refills | Status: DC

## 2017-03-02 NOTE — Telephone Encounter (Signed)
Pt is needing a refill for fentaNYL (DURAGESIC) 25 MCG/HR patch

## 2017-03-02 NOTE — Telephone Encounter (Signed)
Rx at the front desk.

## 2017-03-02 NOTE — Telephone Encounter (Signed)
Last Rx given 02/07/17 with note, "fill on or after 02/08/17." Rx printed with note, "fill on or after 03/11/17," and given to Dr. Leta Baptist (work-in Dr.) to sign.

## 2017-03-13 ENCOUNTER — Other Ambulatory Visit: Payer: Self-pay | Admitting: Neurology

## 2017-03-24 DIAGNOSIS — J069 Acute upper respiratory infection, unspecified: Secondary | ICD-10-CM | POA: Diagnosis not present

## 2017-03-28 ENCOUNTER — Other Ambulatory Visit: Payer: Self-pay | Admitting: Neurology

## 2017-03-29 ENCOUNTER — Ambulatory Visit
Admission: RE | Admit: 2017-03-29 | Discharge: 2017-03-29 | Disposition: A | Discharge status: Home / Self Care | Payer: PPO | Source: Ambulatory Visit | Attending: Nurse Practitioner | Admitting: Nurse Practitioner

## 2017-03-29 ENCOUNTER — Other Ambulatory Visit: Payer: Self-pay | Admitting: Nurse Practitioner

## 2017-03-29 DIAGNOSIS — J0100 Acute maxillary sinusitis, unspecified: Secondary | ICD-10-CM | POA: Diagnosis not present

## 2017-03-29 DIAGNOSIS — J153 Pneumonia due to streptococcus, group B: Secondary | ICD-10-CM | POA: Diagnosis not present

## 2017-03-29 DIAGNOSIS — R05 Cough: Secondary | ICD-10-CM | POA: Diagnosis not present

## 2017-03-29 IMAGING — CR DG CHEST 2V
2 series · 2 of 2 positions shown · non-contrast
Comparison: [DATE]

CLINICAL DATA: Cough and congestion.

EXAM:
CHEST  2 VIEW

[w chest pa]
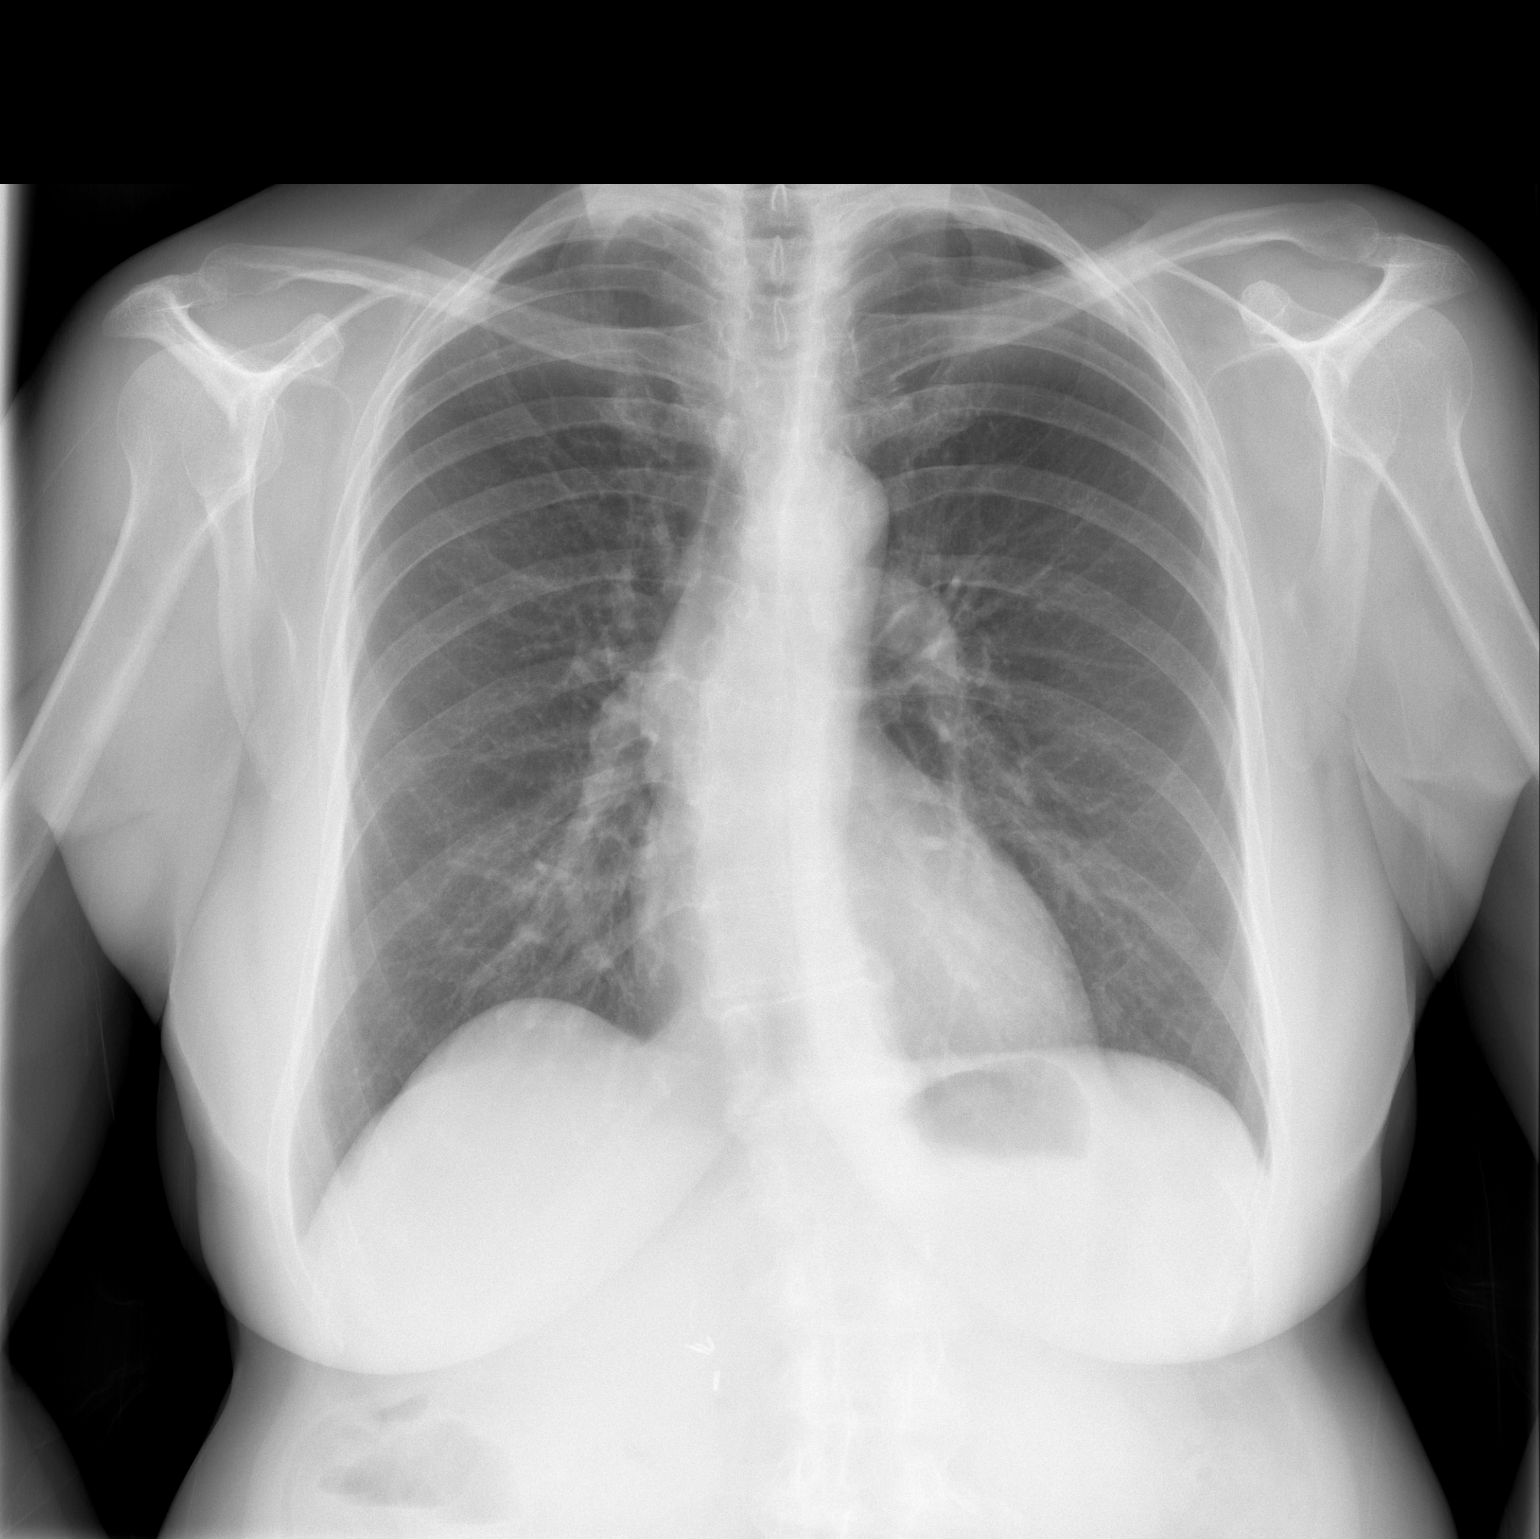

[w chest lat]
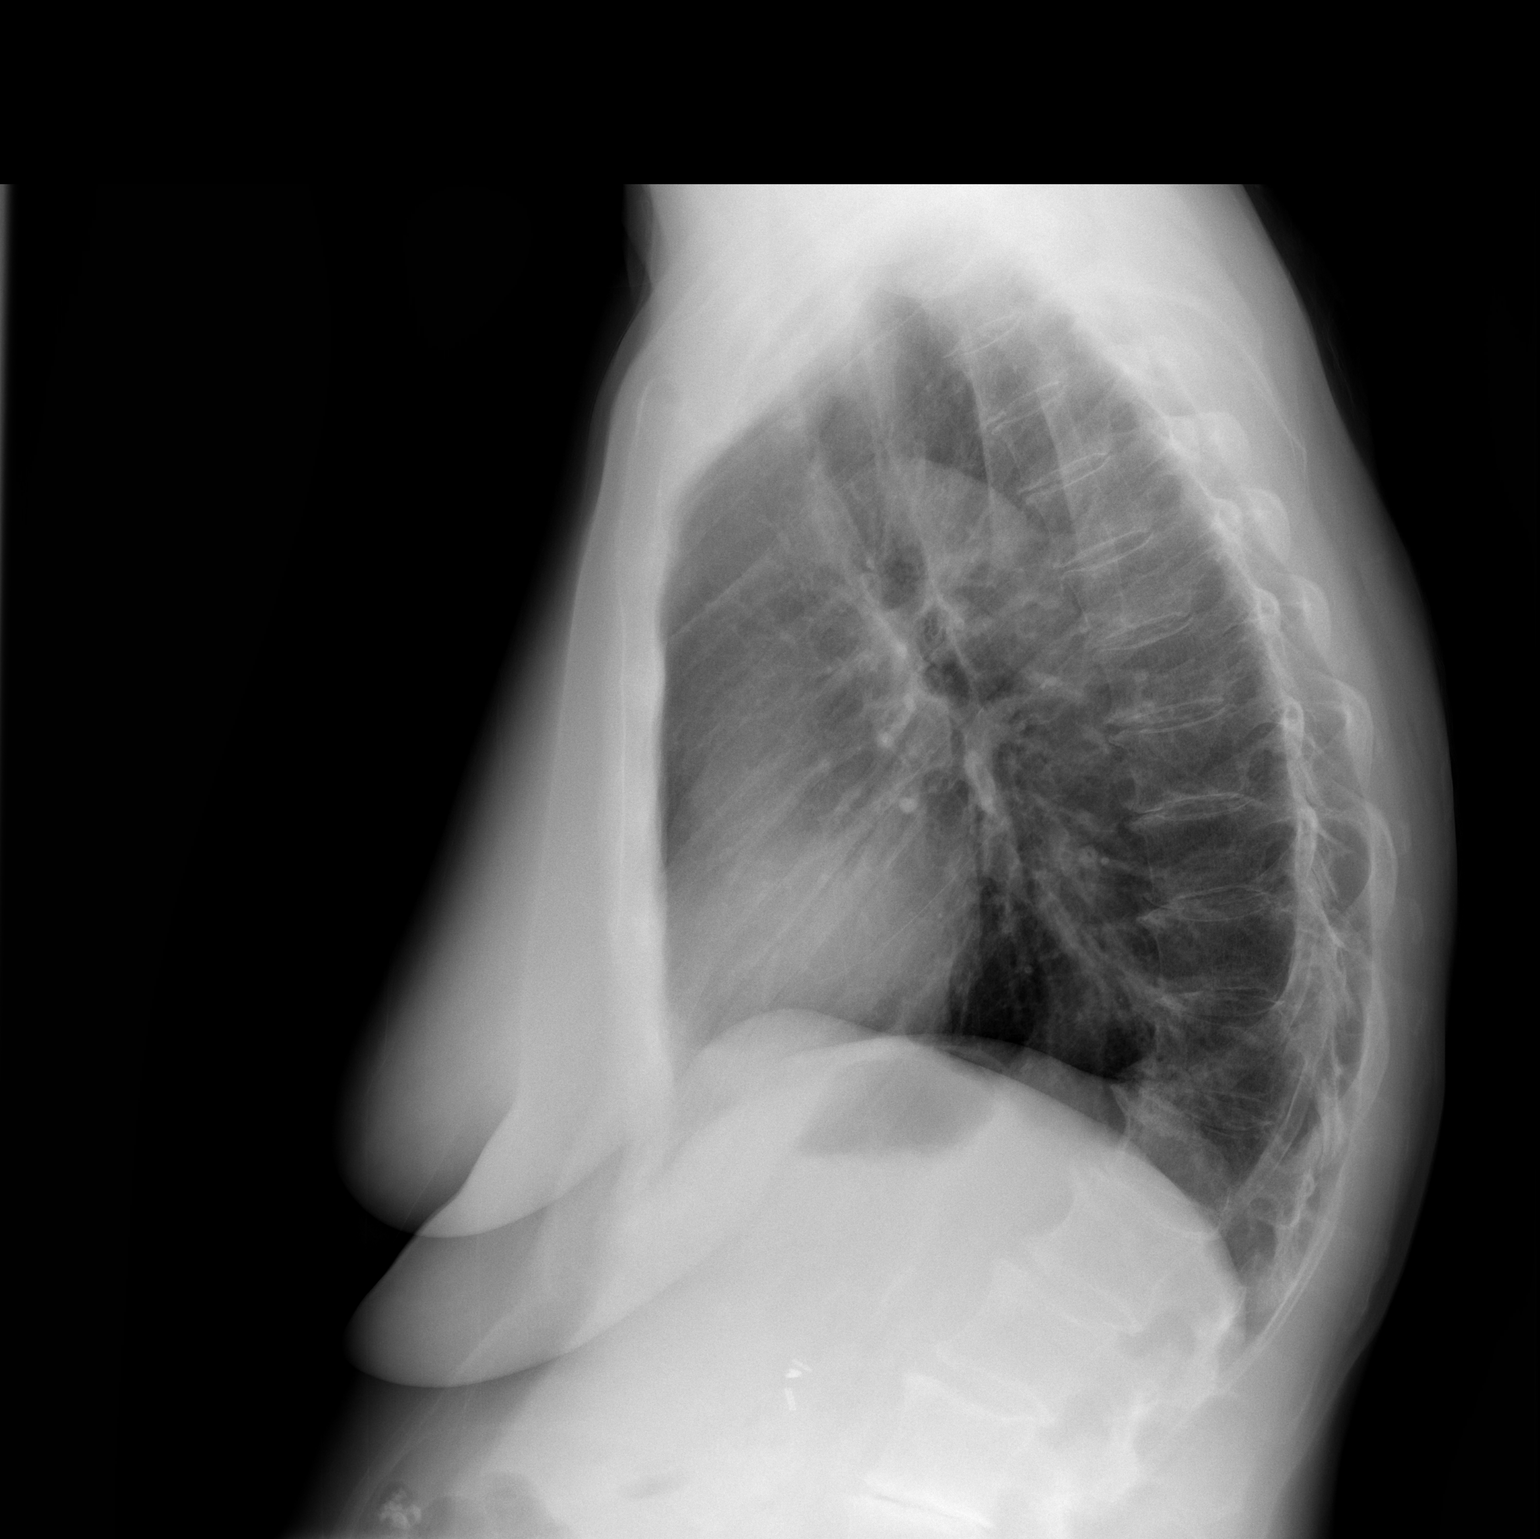

[2 of 2 positions shown; findings below may reference images not displayed]

FINDINGS: Again noted is curvature in the thoracic spine. Both lungs are
clear. Heart and mediastinum are within normal limits. No pleural
effusions. Degenerative changes in lower thoracic spine.
Cholecystectomy clips in the upper abdomen.
IMPRESSION: No active cardiopulmonary disease.

## 2017-04-01 ENCOUNTER — Other Ambulatory Visit: Payer: Self-pay | Admitting: Neurology

## 2017-04-04 ENCOUNTER — Telehealth: Payer: Self-pay | Admitting: Neurology

## 2017-04-04 MED ORDER — METHOCARBAMOL 500 MG PO TABS: 500.0000 mg | ORAL_TABLET | Freq: Three times a day (TID) | ORAL | 3 refills | Status: DC | PRN

## 2017-04-04 MED ORDER — FENTANYL 25 MCG/HR TD PT72: 25.0000 ug | MEDICATED_PATCH | TRANSDERMAL | 0 refills | Status: DC

## 2017-04-04 NOTE — Telephone Encounter (Signed)
Rx ready at the front desk.

## 2017-04-04 NOTE — Telephone Encounter (Signed)
Pt is needing a refill for fentaNYL (DURAGESIC) 25 MCG/HR patch. Pt is requesting a call regarding a different medication not on pts list

## 2017-04-04 NOTE — Telephone Encounter (Signed)
I called and spoke with patient and she states that she tried the Tizanidine but it was ineffective and went back to the Robaxin and has been on it for about 6 months. Rx refilled and Tizanidine removed from list.

## 2017-04-06 ENCOUNTER — Other Ambulatory Visit: Payer: Self-pay | Admitting: Neurology

## 2017-04-06 DIAGNOSIS — R0602 Shortness of breath: Secondary | ICD-10-CM | POA: Diagnosis not present

## 2017-04-06 DIAGNOSIS — J209 Acute bronchitis, unspecified: Secondary | ICD-10-CM | POA: Diagnosis not present

## 2017-04-13 DIAGNOSIS — J209 Acute bronchitis, unspecified: Secondary | ICD-10-CM | POA: Diagnosis not present

## 2017-04-14 ENCOUNTER — Other Ambulatory Visit: Payer: Self-pay | Admitting: Neurology

## 2017-04-20 DIAGNOSIS — J0100 Acute maxillary sinusitis, unspecified: Secondary | ICD-10-CM | POA: Diagnosis not present

## 2017-04-28 ENCOUNTER — Other Ambulatory Visit: Payer: Self-pay | Admitting: Neurology

## 2017-04-28 DIAGNOSIS — J321 Chronic frontal sinusitis: Secondary | ICD-10-CM | POA: Diagnosis not present

## 2017-04-28 DIAGNOSIS — J322 Chronic ethmoidal sinusitis: Secondary | ICD-10-CM | POA: Diagnosis not present

## 2017-05-06 DIAGNOSIS — J329 Chronic sinusitis, unspecified: Secondary | ICD-10-CM | POA: Diagnosis not present

## 2017-05-06 DIAGNOSIS — J321 Chronic frontal sinusitis: Secondary | ICD-10-CM | POA: Diagnosis not present

## 2017-05-06 DIAGNOSIS — J322 Chronic ethmoidal sinusitis: Secondary | ICD-10-CM | POA: Diagnosis not present

## 2017-05-06 HISTORY — PX: NASAL SINUS SURGERY: SHX719

## 2017-05-09 ENCOUNTER — Telehealth: Payer: Self-pay | Admitting: Neurology

## 2017-05-09 MED ORDER — FENTANYL 25 MCG/HR TD PT72: 25.0000 ug | MEDICATED_PATCH | TRANSDERMAL | 0 refills | Status: DC

## 2017-05-09 NOTE — Telephone Encounter (Signed)
Rx. up front [facility]/fim 

## 2017-05-09 NOTE — Telephone Encounter (Signed)
Rx. awaiting RAS sig/fim 

## 2017-05-09 NOTE — Telephone Encounter (Signed)
Pt request refill for fentaNYL (DURAGESIC) 25 MCG/HR patch

## 2017-05-11 ENCOUNTER — Telehealth: Payer: Self-pay | Admitting: Obstetrics and Gynecology

## 2017-05-11 NOTE — Telephone Encounter (Signed)
Spoke with patient. Advised patient last Prolia injection was 01/06/17, next prolia due after 07/05/17. Patient states she was trying to get lab work and prolia on same day if possible. Advised will review with Dr. Quincy Simmonds to see if any additional labs will be needed for Prolia and return call, patient is agreeable.   Dr. Quincy Simmonds -any additional labs needed before next prolia? Last PTH, calcium normal 09/15/16.

## 2017-05-11 NOTE — Telephone Encounter (Signed)
The patient does not need a repeat calcium with each Prolia injection.  We are following a new protocol.

## 2017-05-11 NOTE — Telephone Encounter (Signed)
Patient called and rescheduled her AEX with Dr. Quincy Simmonds from 05/26/17 to 06/30/17 to better coincide with her due date for her next Prolia injection. She said she'd like to have her blood test done that day so she can get the results and stay on track with Prolia.Marland Kitchen

## 2017-05-12 NOTE — Telephone Encounter (Signed)
Spoke with patient, advised as seen below per Dr. Quincy Simmonds. Patient request to get prolia injection day of AEX if possible. Advised prolia due after 4/30. AEX rescheduled to 07/11/17 at 2pm with Dr. Quincy Simmonds.   Instructed patient to contact [facility] at least 2 wks prior to appointment to request refill of Prolia and authorize shipment of mediation to office. Advised to return call to office if any additional assistance is needed or for any further questions.   Routing to provider for final review. Patient is agreeable to disposition. Will close encounter.

## 2017-05-25 ENCOUNTER — Other Ambulatory Visit: Payer: Self-pay | Admitting: Otolaryngology

## 2017-05-25 DIAGNOSIS — J329 Chronic sinusitis, unspecified: Secondary | ICD-10-CM | POA: Diagnosis not present

## 2017-05-25 DIAGNOSIS — J322 Chronic ethmoidal sinusitis: Secondary | ICD-10-CM | POA: Diagnosis not present

## 2017-05-25 DIAGNOSIS — J321 Chronic frontal sinusitis: Secondary | ICD-10-CM | POA: Diagnosis not present

## 2017-05-26 ENCOUNTER — Ambulatory Visit: Payer: PPO | Admitting: Obstetrics and Gynecology

## 2017-05-26 ENCOUNTER — Telehealth: Payer: Self-pay | Admitting: Neurology

## 2017-05-26 MED ORDER — FENTANYL 25 MCG/HR TD PT72: 25.0000 ug | MEDICATED_PATCH | TRANSDERMAL | 0 refills | Status: DC

## 2017-05-26 MED ORDER — AMPHETAMINE-DEXTROAMPHETAMINE 20 MG PO TABS: ORAL_TABLET | ORAL | 0 refills | Status: DC

## 2017-05-26 NOTE — Telephone Encounter (Signed)
Pt requesting refills for fentaNYL (DURAGESIC) 25 MCG/HR patch and amphetamine-dextroamphetamine (ADDERALL) 20 MG tablet. Pt aware the office will be closing at noon tomorrow 3/22

## 2017-05-26 NOTE — Telephone Encounter (Signed)
Rx's awaiting RAS sig/fim 

## 2017-05-26 NOTE — Telephone Encounter (Signed)
Rx. up front [facility]/fim 

## 2017-06-08 ENCOUNTER — Ambulatory Visit: Payer: PPO | Admitting: Neurology

## 2017-06-10 ENCOUNTER — Other Ambulatory Visit: Payer: Self-pay | Admitting: Neurology

## 2017-06-13 ENCOUNTER — Telehealth: Payer: Self-pay | Admitting: Obstetrics and Gynecology

## 2017-06-13 NOTE — Telephone Encounter (Signed)
Last Prolia injection was 01/06/2017. Next injection due after April 30th, 2019. Spoke with patient. Advised of delivery date and okay to have at [facility] on 07/11/2017 as scheduled. Patient verbalizes understanding.  Routing to provider for final review. Patient agreeable to disposition. Will close encounter.

## 2017-06-13 NOTE — Telephone Encounter (Signed)
Patient returned call to Kaitlyn. °

## 2017-06-13 NOTE — Telephone Encounter (Signed)
[facility] calling to schedule delivery of Prolia to our office. Prolia will delivered next Wednesday 06/22/17.

## 2017-06-13 NOTE — Telephone Encounter (Signed)
Left message to call Kaitlyn at [phone]. 

## 2017-06-15 ENCOUNTER — Telehealth: Payer: Self-pay | Admitting: Neurology

## 2017-06-15 NOTE — Telephone Encounter (Signed)
Pt requesting a call back to discuss an additional pain medication to go along with her fentanyl patch, wanting to know if she can take 5MG  Hydrocodone

## 2017-06-15 NOTE — Telephone Encounter (Signed)
Spoke with Rachal and explained that, due to safety issues, [facility] can't add Hydrocodone to Fentanyl and Clonazepam/fim

## 2017-06-22 ENCOUNTER — Encounter: Payer: Self-pay | Admitting: Neurology

## 2017-06-22 ENCOUNTER — Other Ambulatory Visit: Payer: Self-pay

## 2017-06-22 ENCOUNTER — Ambulatory Visit (INDEPENDENT_AMBULATORY_CARE_PROVIDER_SITE_OTHER): Payer: PPO | Admitting: Neurology

## 2017-06-22 VITALS — BP 142/72 | HR 93 | Resp 18 | Ht 65.5 in | Wt 132.5 lb

## 2017-06-22 DIAGNOSIS — R208 Other disturbances of skin sensation: Secondary | ICD-10-CM | POA: Diagnosis not present

## 2017-06-22 DIAGNOSIS — M5432 Sciatica, left side: Secondary | ICD-10-CM

## 2017-06-22 DIAGNOSIS — R260 Ataxic gait: Secondary | ICD-10-CM | POA: Diagnosis not present

## 2017-06-22 DIAGNOSIS — R5382 Chronic fatigue, unspecified: Secondary | ICD-10-CM | POA: Diagnosis not present

## 2017-06-22 DIAGNOSIS — R3915 Urgency of urination: Secondary | ICD-10-CM

## 2017-06-22 DIAGNOSIS — G35 Multiple sclerosis: Secondary | ICD-10-CM

## 2017-06-22 DIAGNOSIS — G35D Multiple sclerosis, unspecified: Secondary | ICD-10-CM

## 2017-06-22 DIAGNOSIS — F418 Other specified anxiety disorders: Secondary | ICD-10-CM | POA: Diagnosis not present

## 2017-06-22 MED ORDER — FENTANYL 25 MCG/HR TD PT72: 25.0000 ug | MEDICATED_PATCH | TRANSDERMAL | 0 refills | Status: DC

## 2017-06-22 MED ORDER — AMPHETAMINE-DEXTROAMPHETAMINE 20 MG PO TABS: ORAL_TABLET | ORAL | 0 refills | Status: DC

## 2017-06-22 NOTE — Progress Notes (Signed)
[facility]  PATIENT: Sheri Burns Page DOB: 10/14/1955  REFERRING CLINICIAN: Thressa Burns  HISTORY FROM: Patient REASON FOR VISIT: MS   HISTORICAL  CHIEF COMPLAINT:  Chief Complaint  Patient presents with  . Multiple Sclerosis    Sts. she continues to tolerate Arava well./fim    HISTORY OF PRESENT ILLNESS:  Sheri Burns is a 61 year old woman with MS.     Update 06/22/2017: She feels her MS is stable.   She is on leflunomide and tolerates it well.   Shehas no recent exacerbations.   She notes mild reduced balance but gait is stable.   No falls.    Strength is slightly weak in the left leg and she has left > right leg dysesthesias.    She also has sciatic type pain on the left.   She is on fentanyl 25 mcg and clonazepam and tramadol qid  Sheri Burns is helping her bladder and she tolerates it well.  However, when she was coughing a lot associated with a mild pneumonia she did have some leakage   vision is doing ok.    She has sleep maintenance insomnia waking up several times every night.   She has some daytime fatigue.    She notes some depression and has stess with husband's illness.    Update 02/07/2017:    Her MS is stable.   She tolerates the Leflunomide well.   She has not had any exacerbation on it.      She feels her gait is stable. At times her legs are tired and balance can be slightly off but she has not had any falls. There will be some numbness and tingling in the left leg  She is still having left leg pain.   It radiates from the buttock into the left leg to the foot (top > bottom).   She denies weakness.  No bladder changes.    Years ago, she had a couple ESI's with benefit.   She got 2 month benefit from a piriformis muscle injection at the last visit.  Fentanyl helps the pain.   Mood is stable. She notes fatigue and attentional deficits, both help with Adderall.   From 08/12/2016: Pain:   She reports new pain in the left buttock that radiates down  the leg. She also has numbness in the left anterolateral thigh and some pain in the lateral groin region.   In the past, she had similar pain in the right buttock that improved after trigger point injection.  MS:   Because her Aubagio co-pay was very high, I placed her on leflunomide. She is tolerating it well and has had no MS exacerbation.  Her last MRI of the brain was some are 2016 and we discussed getting another one to determine if she is having any subclinical activity that would make [place] consider a different medication.  Gait/strength/sensation: She reports that her gait is stable. She still notes mild weakness in her legs and mild problems with her balance. She notes numbness and sometimes painful tingling in the left leg.  Bladder:   She denies any change in her bladder.   Fatigue: She continues to report difficulties with physical and cognitive fatigue. This is improved with Adderall  MS History:  She was diagnosed with multiple sclerosis more than 20 years ago. Initially, she was treated with Betaseron then Copaxone but has not been on any disease modifying therapy for about 8 years. During this time, she has had occasional  Exacerbations with more fatigue and gait issues.  She also has noted mild cognitive dysfunction worsening over the past 5 or 6 years. Her MRI of the brain performed 08/31/2012 shows foci that are predominantly periventricular. They are consistent with the diagnosis of multiple sclerosis. When this MRI was compared to an MRI dated 10/12/2010, there was one additional small periventricular focus. There were no acute findings on either MRI.   In November 2015, she had an exacerbation with severe fatigue, worsened gait and dysphagia.    She started Aubagio 09/09/14.        REVIEW OF SYSTEMS:  Constitutional: No fevers, chills, sweats, or change in appetite.  She notes a lot of fatigue. She is sleeping well. Eyes: No visual changes, double vision, eye pain Ear, nose and  throat: No hearing loss, ear pain, nasal congestion, sore throat Cardiovascular: No chest pain, palpitations Respiratory:  No shortness of breath at rest or with exertion.   No wheezes GastrointestinaI: No nausea, vomiting, diarrhea.  Has constipation Genitourinary:  see above. Musculoskeletal:  reports pain in knees.   Reports neck pain and back pain Integumentary: No rash, pruritus, skin lesions Neurological: as above Psychiatric: Depression and  Anxiety noted Endocrine: No palpitations, diaphoresis, change in appetite, change in weigh or increased thirst Hematologic/Lymphatic:  No anemia, purpura, petechiae. Allergic/Immunologic: No itchy/runny eyes, nasal congestion, recent allergic reactions, rashes  ALLERGIES: Allergies  Allergen Reactions  . Codeine Nausea And Vomiting    vomiting  . Demerol [Meperidine] Nausea And Vomiting  . Ivp Dye [Iodinated Diagnostic Agents]   . Penicillins   . Stadol [Butorphanol] Other (See Comments)    Per patient psychotic event  . Sulfonamide Derivatives Hives  . Clindamycin/Lincomycin Rash  . Gadolinium Derivatives Hives    HOME MEDICATIONS: Outpatient Medications Prior to Visit  Medication Sig Dispense Refill  . acetaminophen (TYLENOL) 500 MG tablet Take 500 mg by mouth 4 (four) times daily.    . Calcium Carb-Cholecalciferol (CALCIUM 600 + D PO) Take 1 tablet by mouth daily.    . cetirizine (ZYRTEC) 10 MG tablet Take 10 mg by mouth.    . clonazePAM (KLONOPIN) 1 MG tablet TAKE 1 TABLET BY MOUTH 3 TIMES A DAY AS NEEDED 90 tablet 2  . DULoxetine (CYMBALTA) 60 MG capsule TAKE 1 CAPSULE (60 MG TOTAL) BY MOUTH DAILY. 90 capsule 3  . fesoterodine (TOVIAZ) 4 MG TB24 tablet Take 4 mg by mouth daily.    Marland Kitchen gabapentin (NEURONTIN) 600 MG tablet TAKE 1 TABLET 4 TIMES A DAY 360 tablet 3  . imipramine (TOFRANIL) 25 MG tablet Take 2 tablets at bedtime. 180 tablet 3  . leflunomide (ARAVA) 20 MG tablet TAKE ONE-HALF TABLET DAILY FOR 2 WEEKS, THEN INCREASE TO  ONE WHOLE TABLET DAILY. 90 tablet 3  . methocarbamol (ROBAXIN) 500 MG tablet Take 1 tablet (500 mg total) by mouth every 8 (eight) hours as needed for muscle spasms. 90 tablet 3  . Multiple Vitamin (MULTIVITAMIN) tablet Take 1 tablet by mouth daily.    . risedronate (ACTONEL) 150 MG tablet Take 1 tablet (150 mg total) by mouth every 30 (thirty) days. with water on empty stomach, nothing by mouth or lie down for next 30 minutes. 3 tablet 2  . traMADol (ULTRAM) 50 MG tablet TAKE 1 TABLET EVERY 6 HOURS AS NEEDED 120 tablet 5  . amphetamine-dextroamphetamine (ADDERALL) 20 MG tablet Take 1 tablet twice daily as needed. 60 tablet 0  . fentaNYL (DURAGESIC) 25 MCG/HR patch Place 1 patch (25  mcg total) onto the skin every 3 (three) days. 10 patch 0   No facility-administered medications prior to visit.     PAST MEDICAL HISTORY: Past Medical History:  Diagnosis Date  . Anxiety   . Bulging lumbar disc 1980  . Chronic pansinusitis   . Colon polyp   . Depression   . GERD (gastroesophageal reflux disease)   . Gynecological examination    sees Dr. Elyse Hsu   . Headache(784.0)   . Multiple sclerosis ([place]) since 1992   Dr. Arlice Colt in [facility]  . Multiple thyroid nodules 1995   per pt. resolved on its own follwed by endo then released  . Neurogenic bladder disorder 12/2009   w/ urge & stress inc. dx. Dr. McDiarmid  . Neuropathy   . Osteoarthritis   . Osteoporosis    last DEXA 2018  . Overactive bladder    sees Dr. Nicki Reaper McDiarmid   . Pancreatitis 1977  . Ulcerative proctitis ([place])   . Vision abnormalities     PAST SURGICAL HISTORY: Past Surgical History:  Procedure Laterality Date  . APPENDECTOMY    . breast ruptured  left   ductal cyst  . CHOLECYSTECTOMY    . COLONOSCOPY  01/10/09   repeat in 5 yrs Dr. Wilford Corner  . DILATION AND CURETTAGE OF UTERUS    . LAPAROSCOPY     x 3  . left knee surgery  1978   to remove bone tumor    . NASAL SINUS SURGERY Bilateral  01/13/2015   Procedure: ENDOSCOPIC SINUS SURGERY;  Surgeon: Izora Gala, MD;  Location: [facility];  Service: ENT;  Laterality: Bilateral;  . NASAL TURBINATE REDUCTION    . pubo vaginal sling  10/2003  . TONSILLECTOMY    . vaginal cystocele repair     anterior  . VAGINAL HYSTERECTOMY  1995   AUB & Endometriosis  . widom teeth removal      FAMILY HISTORY: Family History  Problem Relation Age of Onset  . Osteoporosis Mother        severe  . Cancer Mother 30       colon /Breast also m. niece at 20's  . Breast cancer Mother   . Osteopenia Sister        X 2  . Breast cancer Other     SOCIAL HISTORY:  Social History   Socioeconomic History  . Marital status: Married    Spouse name: Sheri Burns  . Number of children: 1  . Years of education: 53  . Highest education level: Not on file  Occupational History  . Occupation: Nurse, learning disability: UNEMPLOYED  . Occupation: [employer] Engineer, mining Needs  . Financial resource strain: Not on file  . Food insecurity:    Worry: Not on file    Inability: Not on file  . Transportation needs:    Medical: Not on file    Non-medical: Not on file  Tobacco Use  . Smoking status: Former Smoker    Packs/day: 1.00    Years: 5.00    Pack years: 5.00    Types: Cigarettes    Last attempt to quit: 03/09/1979    Years since quitting: 38.3  . Smokeless tobacco: Never Used  Substance and Sexual Activity  . Alcohol use: No  . Drug use: No  . Sexual activity: Not Currently    Partners: Male    Birth control/protection: Surgical  Lifestyle  . Physical activity:  Days per week: Not on file    Minutes per session: Not on file  . Stress: Not on file  Relationships  . Social connections:    Talks on phone: Not on file    Gets together: Not on file    Attends religious service: Not on file    Active member of club or organization: Not on file    Attends meetings of clubs or organizations: Not on file    Relationship  status: Not on file  . Intimate partner violence:    Fear of [place] or ex partner: Not on file    Emotionally abused: Not on file    Physically abused: Not on file    Forced sexual activity: Not on file  Other Topics Concern  . Not on file  Social History Narrative  . Not on file     PHYSICAL EXAM  Vitals:   06/22/17 1130  BP: (!) 142/72  Pulse: 93  Resp: 18  Weight: 132 lb 8 oz (60.1 kg)  Height: 5' 5.5" (1.664 m)    Body mass index is 21.71 kg/m.   General: The patient is well-developed and well-nourished and in no acute distress.       Neurologic Exam  Mental status: The patient is alert and oriented x 3 at the time of the examination. The patient has apparent normal recent and remote memory, with an apparently normal attention span and concentration ability.   Speech is normal.  Cranial nerves: Extraocular movements are full.  Facial strength and sensation is normal.  Trapezius strength is strong..  Motor:  Muscle bulk is normal and tone is increased in legs. Strength is  5 / 5 in all 4 extremities.   Sensory: She has decreased sensation to touch temperature and vibration in the left leg relative to the right leg.  Sensation was normal in the arm  Coordination: Cerebellar testing shows mildly reduced heel to shin but normal finger to nose  Gait and station: Station is stable.  The gait is mildly wide.  Tandem gait is wide.  Romberg erg is negative.   Reflexes: Deep tendon reflexes are symmetric and fairly normal in the arms. However, she has increased reflexes in the legs with spread at the knees and a couple beats of nonsustained clonus in the ankles...     DIAGNOSTIC DATA (LABS, IMAGING, TESTING) - I reviewed patient records, labs, notes, testing and imaging myself where available.      ASSESSMENT AND PLAN  MULTIPLE SCLEROSIS  Sciatica of left side  Depression with anxiety  Chronic fatigue  Ataxic gait  Dysesthesia  1.   Continue  leflunomide.  She reports having blood work in January. 2.   Renew Adderall  IR.  Renew Fentanyl patch.   Because she is on a combination of the benzodiazepine and fentanyl and tramadol, I would be reluctant to increase any opiate dose. 3.   Try to stay active and exercise 4.   rtc 4 months, sooner if worse  Calianne Larue A. Felecia Shelling, MD, PhD 3/54/6568, 12:75 PM Certified in Neurology, Clinical Neurophysiology, Sleep Medicine, Pain Medicine and Neuroimaging  [facility] [address], [place], [place] [address] [phone]

## 2017-06-30 ENCOUNTER — Ambulatory Visit: Payer: PPO | Admitting: Obstetrics and Gynecology

## 2017-07-11 ENCOUNTER — Ambulatory Visit: Payer: Self-pay | Admitting: Obstetrics and Gynecology

## 2017-07-14 ENCOUNTER — Encounter: Payer: Self-pay | Admitting: Obstetrics and Gynecology

## 2017-07-14 ENCOUNTER — Ambulatory Visit (INDEPENDENT_AMBULATORY_CARE_PROVIDER_SITE_OTHER): Payer: PPO | Admitting: Obstetrics and Gynecology

## 2017-07-14 ENCOUNTER — Other Ambulatory Visit: Payer: Self-pay

## 2017-07-14 VITALS — BP 128/76 | HR 92 | Resp 16 | Ht 65.25 in | Wt 133.0 lb

## 2017-07-14 DIAGNOSIS — Z01419 Encounter for gynecological examination (general) (routine) without abnormal findings: Secondary | ICD-10-CM | POA: Diagnosis not present

## 2017-07-14 DIAGNOSIS — M810 Age-related osteoporosis without current pathological fracture: Secondary | ICD-10-CM

## 2017-07-14 MED ORDER — DENOSUMAB 60 MG/ML ~~LOC~~ SOSY: 60.0000 mg | PREFILLED_SYRINGE | SUBCUTANEOUS | 1 refills | Status: AC

## 2017-07-14 MED ORDER — DENOSUMAB 60 MG/ML ~~LOC~~ SOSY
60.0000 mg | PREFILLED_SYRINGE | Freq: Once | SUBCUTANEOUS | Status: AC
  Administered 2017-07-14: 60 mg via SUBCUTANEOUS

## 2017-07-14 NOTE — Patient Instructions (Signed)

## 2017-07-14 NOTE — Progress Notes (Signed)
62 y.o. G73P1001 Married Caucasian female here for annual exam.    Had endoscopic sinus surgery in March and doing well.  Pneumonia in January.   Has MS and neurogenic bladder.  Followed by Dr. Matilde Sprang.  Taking [name] and has reduced bladder urgency and frequency.  Does leak urine if she picks up her grandchild.  Does wear a Poise pad.   Has constipation.  States is it drug side effect induced.  Has chronic pain in legs, back, and neck.  Uses Miralax.   PCP:  Dr. Lavone Orn   Neurologist:  Dr. Arlice Colt - [place]  Patient's last menstrual period was 07/06/1993.           Sexually active: No.  The Sifuentes method of family planning is status post hysterectomy.   Still has her ovaries.  Exercising: Yes.    stationary bike Smoker:  no  Health Maintenance: Pap:  TVH 1995 History of abnormal Pap:  no MMG:  08/09/16 BIRADS 1 negative/density c.   Colonoscopy:  12/2013 f/u 5 years BMD:   09/03/16  Result  Osteoporosis.  Taking Prolia.  Last injection 01/06/17.  Due today.  TDaP:  01/2016 Gardasil:   n/a HIV: never Hep C: never Screening Labs:  PCP   reports that she quit smoking about 38 years ago. Her smoking use included cigarettes. She has a 5.00 pack-year smoking history. She has never used smokeless tobacco. She reports that she does not drink alcohol or use drugs.  Past Medical History:  Diagnosis Date  . Anxiety   . Bulging lumbar disc 1980  . Chronic pansinusitis   . Colon polyp   . Depression   . GERD (gastroesophageal reflux disease)   . Gynecological examination    sees Dr. Elyse Hsu   . Headache(784.0)   . Multiple sclerosis ([place]) since 1992   Dr. Arlice Colt in [facility]  . Multiple thyroid nodules 1995   per pt. resolved on its own follwed by endo then released  . Neurogenic bladder disorder 12/2009   w/ urge & stress inc. dx. Dr. McDiarmid  . Neuropathy   . Osteoarthritis   . Osteoporosis    last DEXA 2018  . Overactive bladder    sees Dr. Nicki Reaper McDiarmid   . Pancreatitis 1977  . Ulcerative proctitis ([place])   . Vision abnormalities     Past Surgical History:  Procedure Laterality Date  . APPENDECTOMY    . breast ruptured  left   ductal cyst  . CHOLECYSTECTOMY    . COLONOSCOPY  01/10/09   repeat in 5 yrs Dr. Wilford Corner  . DILATION AND CURETTAGE OF UTERUS    . LAPAROSCOPY     x 3  . left knee surgery  1978   to remove bone tumor    . NASAL SINUS SURGERY Bilateral 01/13/2015   Procedure: ENDOSCOPIC SINUS SURGERY;  Surgeon: Izora Gala, MD;  Location: [facility];  Service: ENT;  Laterality: Bilateral;  . NASAL SINUS SURGERY  05/2017  . NASAL TURBINATE REDUCTION    . pubo vaginal sling  10/2003  . TONSILLECTOMY    . vaginal cystocele repair     anterior  . VAGINAL HYSTERECTOMY  1995   AUB & Endometriosis  . widom teeth removal      [facility] Medications  Medication Sig Dispense Refill  . acetaminophen (TYLENOL) 500 MG tablet Take 500 mg by mouth 4 (four) times daily.    Marland Kitchen amphetamine-dextroamphetamine (ADDERALL) 20 MG  tablet Take 1 tablet twice daily as needed. 60 tablet 0  . Calcium Carb-Cholecalciferol (CALCIUM 600 + D PO) Take 1 tablet by mouth daily.    . cetirizine (ZYRTEC) 10 MG tablet Take 10 mg by mouth.    . clonazePAM (KLONOPIN) 1 MG tablet TAKE 1 TABLET BY MOUTH 3 TIMES A DAY AS NEEDED 90 tablet 2  . denosumab (PROLIA) 60 MG/ML SOSY injection Inject 60 mg into the skin every 6 (six) months.    . DULoxetine (CYMBALTA) 60 MG capsule TAKE 1 CAPSULE (60 MG TOTAL) BY MOUTH DAILY. 90 capsule 3  . fentaNYL (DURAGESIC) 25 MCG/HR patch Place 1 patch (25 mcg total) onto the skin every 3 (three) days. 10 patch 0  . fesoterodine (TOVIAZ) 4 MG TB24 tablet Take 4 mg by mouth daily.    Marland Kitchen gabapentin (NEURONTIN) 600 MG tablet TAKE 1 TABLET 4 TIMES A DAY 360 tablet 3  . leflunomide (ARAVA) 20 MG tablet TAKE ONE-HALF TABLET DAILY FOR 2 WEEKS, THEN INCREASE TO ONE WHOLE TABLET DAILY. 90  tablet 3  . methocarbamol (ROBAXIN) 500 MG tablet Take 1 tablet (500 mg total) by mouth every 8 (eight) hours as needed for muscle spasms. 90 tablet 3  . Multiple Vitamin (MULTIVITAMIN) tablet Take 1 tablet by mouth daily.    . traMADol (ULTRAM) 50 MG tablet TAKE 1 TABLET EVERY 6 HOURS AS NEEDED 120 tablet 5   No [facility] facility-administered medications for this visit.     Family History  Problem Relation Age of Onset  . Osteoporosis Mother        severe  . Cancer Mother 60       colon /Breast also m. niece at 95's  . Breast cancer Mother   . Osteopenia Sister        X 2  . Breast cancer Other   Niece with breast cancer age 84.  Mother with colon cancer age 88 with and 74 with breast cancer.   Review of Systems  Constitutional: Negative.   HENT: Negative.   Eyes: Negative.   Respiratory: Negative.   Cardiovascular: Negative.   Gastrointestinal: Positive for constipation.  Genitourinary:       Loss of urine with sneeze or cough  Musculoskeletal: Negative.   Skin: Negative.   Allergic/Immunologic: Negative.   Neurological: Negative.   Hematological: Negative.   Psychiatric/Behavioral:       Depression    Exam:   BP 128/76 (BP Location: Right Arm, Patient Position: Sitting, Cuff Size: Normal)   Pulse 92   Resp 16   Ht 5' 5.25" (1.657 m)   Wt 133 lb (60.3 kg)   LMP 07/06/1993   BMI 21.96 kg/m     General appearance: alert, cooperative and appears stated age Head: Normocephalic, without obvious abnormality, atraumatic Neck: no adenopathy, supple, symmetrical, trachea midline and thyroid normal to inspection and palpation Lungs: clear to auscultation bilaterally Breasts: normal appearance, no masses or tenderness, No nipple retraction or dimpling, No nipple discharge or bleeding, No axillary or supraclavicular adenopathy Heart: regular rate and rhythm Abdomen: soft, non-tender; no masses, no organomegaly Extremities: extremities normal, atraumatic, no cyanosis or  edema Skin: Skin color, texture, turgor normal. No rashes or lesions Lymph nodes: Cervical, supraclavicular, and axillary nodes normal. No abnormal inguinal nodes palpated Neurologic: Grossly normal  Pelvic: External genitalia:  no lesions              Urethra:  normal appearing urethra with no masses, tenderness or lesions  Bartholins and Skenes: normal                 Vagina: normal appearing vagina with normal color and discharge, no lesions              Cervix: absent.              Pap taken: No. Bimanual Exam:  Uterus:  Absent.   Bladder palpates as full.               Adnexa: no mass, fullness, tenderness              Rectal exam: Yes.  .  Confirms.              Anus:  normal sphincter tone, no lesions  Chaperone was present for exam.  Assessment:   Well woman visit with normal exam. Status post TVH.  Still has ovaries.  Status post pubovaginal sling.  Mild stress incontinence by history today. MS and neurogenic bladder.  On Cymbalta. Chronic pain.   Osteoporosis.  On Prolia.  FH colon and breast cancer.    Plan: Mammogram screening. Recommended self breast awareness. Pap and HR HPV as above. Guidelines for Calcium, Vitamin D, regular exercise program including cardiovascular and weight bearing exercise. Prolia injection today.  Refill of Prolia for one year.  BMD next year.  We talked about her stress incontinence and potential treatment of this if her symptoms increase. I discussed genetic testing for herself.  Patient will check to see if her niece has done genetic testing.  Follow up annually and prn.   After visit summary provided.

## 2017-07-14 NOTE — Progress Notes (Signed)
Patient given Prolia injection in left arm and she tolerated injection well. She knows to call in 6 months to schedule next injection.

## 2017-07-19 ENCOUNTER — Other Ambulatory Visit: Payer: Self-pay | Admitting: Obstetrics and Gynecology

## 2017-07-19 ENCOUNTER — Other Ambulatory Visit: Payer: Self-pay | Admitting: Obstetrics & Gynecology

## 2017-07-19 DIAGNOSIS — Z1231 Encounter for screening mammogram for malignant neoplasm of breast: Secondary | ICD-10-CM

## 2017-07-21 ENCOUNTER — Other Ambulatory Visit: Payer: Self-pay | Admitting: Neurology

## 2017-07-25 ENCOUNTER — Other Ambulatory Visit: Payer: Self-pay | Admitting: Neurology

## 2017-07-26 ENCOUNTER — Other Ambulatory Visit: Payer: Self-pay | Admitting: Neurology

## 2017-07-27 ENCOUNTER — Telehealth: Payer: Self-pay | Admitting: Neurology

## 2017-07-27 NOTE — Telephone Encounter (Signed)
Spoke with Sheri Burns.  [facility]in escrib[facility] in separate refill task from the pharmacy/fim

## 2017-07-27 NOTE — Telephone Encounter (Signed)
Pt requesting a call back stating she is unclear why her methocarbamol (ROBAXIN) 500 MG tablet had been denied due to it being to soon for a refill. Pt states she only has a few tablets left and will need this [facility]ill [facility] to [facility]

## 2017-08-05 ENCOUNTER — Other Ambulatory Visit: Payer: Self-pay | Admitting: *Deleted

## 2017-08-05 ENCOUNTER — Other Ambulatory Visit: Payer: Self-pay | Admitting: Neurology

## 2017-08-05 MED ORDER — AMPHETAMINE-DEXTROAMPHETAMINE 20 MG PO TABS: ORAL_TABLET | ORAL | 0 refills | Status: DC

## 2017-08-05 MED ORDER — FENTANYL 25 MCG/HR TD PT72: 25.0000 ug | MEDICATED_PATCH | TRANSDERMAL | 0 refills | Status: DC

## 2017-08-05 NOTE — Telephone Encounter (Signed)
Pt request refill for fentaNYL (DURAGESIC) 25 MCG/HR patch. She is aware this will need to be picked up She wants the amphetamine-dextroamphetamine (ADDERALL) 20 MG tablet [facility]

## 2017-08-05 NOTE — Telephone Encounter (Signed)
Patient's appointments are up-to-date.  [place] narcotic registry check without any issues noted.  Fentanyl post-dated, printed, signed by MD and placed up front for pick up.  Adderall is due now and will be sent directly to her requested pharmacy.  Pt is aware.

## 2017-08-09 DIAGNOSIS — J324 Chronic pansinusitis: Secondary | ICD-10-CM | POA: Diagnosis not present

## 2017-08-16 ENCOUNTER — Ambulatory Visit: Payer: Self-pay

## 2017-08-22 DIAGNOSIS — J342 Deviated nasal septum: Secondary | ICD-10-CM | POA: Diagnosis not present

## 2017-08-22 DIAGNOSIS — J324 Chronic pansinusitis: Secondary | ICD-10-CM | POA: Diagnosis not present

## 2017-08-25 DIAGNOSIS — N3946 Mixed incontinence: Secondary | ICD-10-CM | POA: Diagnosis not present

## 2017-08-25 DIAGNOSIS — N319 Neuromuscular dysfunction of bladder, unspecified: Secondary | ICD-10-CM | POA: Diagnosis not present

## 2017-09-01 ENCOUNTER — Telehealth: Payer: Self-pay | Admitting: Neurology

## 2017-09-01 MED ORDER — FENTANYL 25 MCG/HR TD PT72: 25.0000 ug | MEDICATED_PATCH | TRANSDERMAL | 0 refills | Status: DC

## 2017-09-01 NOTE — Telephone Encounter (Signed)
Rx. awaiting RAS sig/fim 

## 2017-09-01 NOTE — Telephone Encounter (Signed)
Pt requesting a refill for fentaNYL (DURAGESIC) 25 MCG/HR patch, [facility]. Pt requesting a call once script has been sent

## 2017-09-01 NOTE — Telephone Encounter (Signed)
Rx. up front [facility]/fim 

## 2017-09-12 ENCOUNTER — Telehealth: Payer: Self-pay | Admitting: Neurology

## 2017-09-12 ENCOUNTER — Ambulatory Visit: Payer: Self-pay

## 2017-09-12 NOTE — Telephone Encounter (Signed)
Spoke with Sheri Burns.  She c/o increased left leg pain for 4-5 days, not getting better. Appt. given 09/13/17/fim

## 2017-09-12 NOTE — Telephone Encounter (Signed)
Pt has called stating Thursday afternoon she began having sever leg pain, the pain has not gone away.  Pt is asking for a call back to discuss.  Please call pt at [phone]

## 2017-09-13 ENCOUNTER — Ambulatory Visit (INDEPENDENT_AMBULATORY_CARE_PROVIDER_SITE_OTHER): Payer: PPO | Admitting: Neurology

## 2017-09-13 ENCOUNTER — Other Ambulatory Visit: Payer: Self-pay | Admitting: Neurology

## 2017-09-13 ENCOUNTER — Other Ambulatory Visit: Payer: Self-pay

## 2017-09-13 ENCOUNTER — Encounter: Payer: Self-pay | Admitting: Neurology

## 2017-09-13 VITALS — BP 114/64 | HR 70 | Resp 16 | Ht 65.25 in | Wt 131.5 lb

## 2017-09-13 DIAGNOSIS — M533 Sacrococcygeal disorders, not elsewhere classified: Secondary | ICD-10-CM | POA: Diagnosis not present

## 2017-09-13 DIAGNOSIS — G35 Multiple sclerosis: Secondary | ICD-10-CM

## 2017-09-13 DIAGNOSIS — Z79899 Other long term (current) drug therapy: Secondary | ICD-10-CM

## 2017-09-13 DIAGNOSIS — M5432 Sciatica, left side: Secondary | ICD-10-CM | POA: Diagnosis not present

## 2017-09-13 DIAGNOSIS — R208 Other disturbances of skin sensation: Secondary | ICD-10-CM

## 2017-09-13 DIAGNOSIS — G5603 Carpal tunnel syndrome, bilateral upper limbs: Secondary | ICD-10-CM | POA: Insufficient documentation

## 2017-09-13 DIAGNOSIS — R5382 Chronic fatigue, unspecified: Secondary | ICD-10-CM

## 2017-09-13 MED ORDER — FENTANYL 25 MCG/HR TD PT72: 25.0000 ug | MEDICATED_PATCH | TRANSDERMAL | 0 refills | Status: DC

## 2017-09-13 MED ORDER — AMPHETAMINE-DEXTROAMPHETAMINE 20 MG PO TABS: ORAL_TABLET | ORAL | 0 refills | Status: DC

## 2017-09-13 NOTE — Telephone Encounter (Signed)
Rx registry checked. Last fill date was 08/14/17 for #90.

## 2017-09-13 NOTE — Telephone Encounter (Signed)
rx sent electronically. 

## 2017-09-13 NOTE — Progress Notes (Signed)
[facility]  PATIENT: Sheri Burns DOB: 1955/04/19  REFERRING CLINICIAN: Thressa Sheller  HISTORY FROM: Patient REASON FOR VISIT: MS   HISTORICAL  CHIEF COMPLAINT:  Chief Complaint  Patient presents with  . Multiple Sclerosis    Sts. she continues to tolerate Arava well.  Today c/o increased left lower back pain radiating down outside of left leg to toes, and down front of left leg to just above the knee, onset 5 days aog without known injury/fim    HISTORY OF PRESENT ILLNESS:  Sheri Burns is a 62 year old woman with MS.     Update 09/13/2017: She is having more left buttock/hip pain.  Pain will sometimes radiate to the toes but more recently is radiating to the front of the thigh.   Pain is worse if she leans over or bends to the side while standing.    Years ago, she was receiving ESI's for LB and right leg pain.      She is on fentanyl 25 mg patches and tolerates them well.   No drug seeking behavior and NCCSRS has been reviewed in the past.     Her MS is stable.   She is on [place] and tolerates it well.   She has no recent exacerbations.     She has left leg mild weakness and dysesthesia.  She is noting numbness in both hands..    Her vision is fine.    She has fatigue.   Sleep is poor due to sleep maintenance insomnia.     She takes clonazepam at night.  Update 06/22/2017: She feels her MS is stable.   She is on leflunomide and tolerates it well.   She has no recent exacerbations.   She notes mild reduced balance but gait is stable.   No falls.    Strength is slightly weak in the left leg and she has left > right leg dysesthesias.    She also has sciatic type pain on the left.   She is on fentanyl 25 mcg and clonazepam and tramadol qid  Sheri Burns is helping her bladder and she tolerates it well.  However, when she was coughing a lot associated with a mild pneumonia she did have some leakage   vision is doing ok.    She has sleep maintenance insomnia waking  up several times every night.   She has some daytime fatigue.    She notes some depression and has stess with husband's illness.    Update 02/07/2017:    Her MS is stable.   She tolerates the Leflunomide well.   She has not had any exacerbation on it.      She feels her gait is stable. At times her legs are tired and balance can be slightly off but she has not had any falls. There will be some numbness and tingling in the left leg  She is still having left leg pain.   It radiates from the buttock into the left leg to the foot (top > bottom).   She denies weakness.  No bladder changes.    Years ago, she had a couple ESI's with benefit.   She got 2 month benefit from a piriformis muscle injection at the last visit.  Fentanyl helps the pain.   Mood is stable. She notes fatigue and attentional deficits, both help with Adderall.   From 08/12/2016: Pain:   She reports new pain in the left buttock that radiates down the leg. She also  has numbness in the left anterolateral thigh and some pain in the lateral groin region.   In the past, she had similar pain in the right buttock that improved after trigger point injection.  MS:   Because her Aubagio co-pay was very high, I placed her on leflunomide. She is tolerating it well and has had no MS exacerbation.  Her last MRI of the brain was some are 2016 and we discussed getting another one to determine if she is having any subclinical activity that would make [place] consider a different medication.  Gait/strength/sensation: She reports that her gait is stable. She still notes mild weakness in her legs and mild problems with her balance. She notes numbness and sometimes painful tingling in the left leg.  Bladder:   She denies any change in her bladder.   Fatigue: She continues to report difficulties with physical and cognitive fatigue. This is improved with Adderall  MS History:  She was diagnosed with multiple sclerosis more than 20 years ago. Initially, she was  treated with Betaseron then Copaxone but has not been on any disease modifying therapy for about 8 years. During this time, she has had occasional  Exacerbations with more fatigue and gait issues.  She also has noted mild cognitive dysfunction worsening over the past 5 or 6 years. Her MRI of the brain performed 08/31/2012 shows foci that are predominantly periventricular. They are consistent with the diagnosis of multiple sclerosis. When this MRI was compared to an MRI dated 10/12/2010, there was one additional small periventricular focus. There were no acute findings on either MRI.   In November 2015, she had an exacerbation with severe fatigue, worsened gait and dysphagia.    She started Aubagio 09/09/14.        REVIEW OF SYSTEMS:  Constitutional: No fevers, chills, sweats, or change in appetite.  She notes a lot of fatigue. She is sleeping well. Eyes: No visual changes, double vision, eye pain Ear, nose and throat: No hearing loss, ear pain, nasal congestion, sore throat Cardiovascular: No chest pain, palpitations Respiratory:  No shortness of breath at rest or with exertion.   No wheezes GastrointestinaI: No nausea, vomiting, diarrhea.  Has constipation Genitourinary:  see above. Musculoskeletal:  reports pain in knees.   Reports neck pain and back pain Integumentary: No rash, pruritus, skin lesions Neurological: as above Psychiatric: Depression and  Anxiety noted Endocrine: No palpitations, diaphoresis, change in appetite, change in weigh or increased thirst Hematologic/Lymphatic:  No anemia, purpura, petechiae. Allergic/Immunologic: No itchy/runny eyes, nasal congestion, recent allergic reactions, rashes  ALLERGIES: Allergies  Allergen Reactions  . Codeine Nausea And Vomiting    vomiting  . Demerol [Meperidine] Nausea And Vomiting  . Ivp Dye [Iodinated Diagnostic Agents]   . Penicillins   . Stadol [Butorphanol] Other (See Comments)    Per patient psychotic event  . Sulfonamide  Derivatives Hives  . Clindamycin/Lincomycin Rash  . Gadolinium Derivatives Hives    HOME MEDICATIONS: Outpatient Medications Prior to Visit  Medication Sig Dispense Refill  . acetaminophen (TYLENOL) 500 MG tablet Take 500 mg by mouth 4 (four) times daily.    . Calcium Carb-Cholecalciferol (CALCIUM 600 + D PO) Take 1 tablet by mouth daily.    Marland Kitchen denosumab (PROLIA) 60 MG/ML SOSY injection Inject 60 mg into the skin every 6 (six) months. 1 each 1  . DULoxetine (CYMBALTA) 60 MG capsule TAKE 1 CAPSULE (60 MG TOTAL) BY MOUTH DAILY. 90 capsule 3  . fesoterodine (TOVIAZ) 4 MG TB24 tablet  Take 4 mg by mouth daily.    Marland Kitchen gabapentin (NEURONTIN) 600 MG tablet TAKE 1 TABLET 4 TIMES A DAY 360 tablet 3  . leflunomide (ARAVA) 20 MG tablet TAKE ONE-HALF TABLET DAILY FOR 2 WEEKS, THEN INCREASE TO ONE WHOLE TABLET DAILY. 90 tablet 3  . methocarbamol (ROBAXIN) 500 MG tablet TAKE 1 TABLET EVERY 8 HOURS AS NEEDED FOR MUSCLE SPASMS 270 tablet 1  . Multiple Vitamin (MULTIVITAMIN) tablet Take 1 tablet by mouth daily.    . traMADol (ULTRAM) 50 MG tablet TAKE 1 TABLET EVERY 6 HOURS AS NEEDED 120 tablet 5  . amphetamine-dextroamphetamine (ADDERALL) 20 MG tablet Take 1 tablet twice daily as needed. 60 tablet 0  . clonazePAM (KLONOPIN) 1 MG tablet TAKE 1 TABLET BY MOUTH 3 TIMES A DAY AS NEEDED 90 tablet 2  . fentaNYL (DURAGESIC) 25 MCG/HR patch Place 1 patch (25 mcg total) onto the skin every 3 (three) days. 10 patch 0  . cetirizine (ZYRTEC) 10 MG tablet Take 10 mg by mouth.     No facility-administered medications prior to visit.     PAST MEDICAL HISTORY: Past Medical History:  Diagnosis Date  . Anxiety   . Bulging lumbar disc 1980  . Chronic pansinusitis   . Colon polyp   . Depression   . GERD (gastroesophageal reflux disease)   . Gynecological examination    sees Dr. Elyse Hsu   . Headache(784.0)   . Multiple sclerosis ([place]) since 1992   Dr. Arlice Colt in [facility]  . Multiple thyroid nodules  1995   per pt. resolved on its own follwed by endo then released  . Neurogenic bladder disorder 12/2009   w/ urge & stress inc. dx. Dr. McDiarmid  . Neuropathy   . Osteoarthritis   . Osteoporosis    last DEXA 2018  . Overactive bladder    sees Dr. Nicki Reaper McDiarmid   . Pancreatitis 1977  . Ulcerative proctitis ([place])   . Vision abnormalities     PAST SURGICAL HISTORY: Past Surgical History:  Procedure Laterality Date  . APPENDECTOMY    . breast ruptured  left   ductal cyst  . CHOLECYSTECTOMY    . COLONOSCOPY  01/10/09   repeat in 5 yrs Dr. Wilford Corner  . DILATION AND CURETTAGE OF UTERUS    . LAPAROSCOPY     x 3  . left knee surgery  1978   to remove bone tumor    . NASAL SINUS SURGERY Bilateral 01/13/2015   Procedure: ENDOSCOPIC SINUS SURGERY;  Surgeon: Izora Gala, MD;  Location: [place];  Service: ENT;  Laterality: Bilateral;  . NASAL SINUS SURGERY  05/2017  . NASAL TURBINATE REDUCTION    . pubo vaginal sling  10/2003  . TONSILLECTOMY    . vaginal cystocele repair     anterior  . VAGINAL HYSTERECTOMY  1995   AUB & Endometriosis  . widom teeth removal      FAMILY HISTORY: Family History  Problem Relation Age of Onset  . Osteoporosis Mother        severe  . Cancer Mother 38       colon /Breast also m. niece at 78's  . Breast cancer Mother   . Osteopenia Sister        X 2  . Breast cancer Other     SOCIAL HISTORY:  Social History   Socioeconomic History  . Marital status: Married    Spouse name: Jori Moll  . Number of children:  1  . Years of education: 59  . Highest education level: Not on file  Occupational History  . Occupation: Nurse, learning disability: UNEMPLOYED  . Occupation: [employer] Engineer, mining Needs  . Financial resource strain: Not on file  . Food insecurity:    Worry: Not on file    Inability: Not on file  . Transportation needs:    Medical: Not on file    Non-medical: Not on file  Tobacco Use  . Smoking status:  Former Smoker    Packs/day: 1.00    Years: 5.00    Pack years: 5.00    Types: Cigarettes    Last attempt to quit: 03/09/1979    Years since quitting: 38.5  . Smokeless tobacco: Never Used  Substance and Sexual Activity  . Alcohol use: No  . Drug use: No  . Sexual activity: Not Currently    Partners: Male    Birth control/protection: Surgical  Lifestyle  . Physical activity:    Days per week: Not on file    Minutes per session: Not on file  . Stress: Not on file  Relationships  . Social connections:    Talks on phone: Not on file    Gets together: Not on file    Attends religious service: Not on file    Active member of club or organization: Not on file    Attends meetings of clubs or organizations: Not on file    Relationship status: Not on file  . Intimate partner violence:    Fear of [place] or ex partner: Not on file    Emotionally abused: Not on file    Physically abused: Not on file    Forced sexual activity: Not on file  Other Topics Concern  . Not on file  Social History Narrative  . Not on file     PHYSICAL EXAM  Vitals:   09/13/17 0830  BP: 114/64  Pulse: 70  Resp: 16  Weight: 131 lb 8 oz (59.6 kg)  Height: 5' 5.25" (1.657 m)    Body mass index is 21.72 kg/m.   General: The patient is well-developed and well-nourished and in no acute distress.    Musculoskeletal: She has a fair amount of tenderness over the left sacroiliac joint and the left piriformis muscle.  There is no tenderness over the trochanteric bursae.    There are Tinel signs over the carpal tunnels.   Neurologic Exam  Mental status: The patient is alert and oriented x 3 at the time of the examination. The patient has apparent normal recent and remote memory, with an apparently normal attention span and concentration ability.   Speech is normal.  Cranial nerves: Extraocular movements are full.  Facial strength and sensation is normal.  Trapezius strength is strong..  Motor:  Muscle  bulk is normal and tone is increased in legs. Strength is  5 / 5 in all 4 extremities except for 4+/5 APB strength.  Sensory: She has decreased sensation to touch temperature and vibration in the left leg relative to the right leg.  She had some reduced sensation over the thenar eminence and is in the hands.  Coordination: Cerebellar testing shows mildly reduced heel to shin but normal finger to nose  Gait and station: Station is stable.  The gait is mildly wide.  Tandem gait is wide.  Romberg erg is negative.   Reflexes: Deep tendon reflexes are symmetric and fairly normal in the arms. However, she has increased  reflexes in the legs with spread at the knees and a couple beats of nonsustained clonus in the ankles...     DIAGNOSTIC DATA (LABS, IMAGING, TESTING) - I reviewed patient records, labs, notes, testing and imaging myself where available.      ASSESSMENT AND PLAN  MULTIPLE SCLEROSIS  Sciatica of left side  Carpal tunnel syndrome, bilateral - Plan: NCV with EMG(electromyography)  Sacroiliac joint pain  Chronic fatigue  High risk medication use  Dysesthesia  1.   Continue leflunomide.  She reports having blood work in January. 2.   Renew Adderall  IR.  Renew Fentanyl patch.   Because she is on a combination of the benzodiazepine and fentanyl and tramadol, I would be reluctant to increase any opiate dose. 3.   Trigger point injection of the left piriformis muscle with 40 mg Depo-Medrol in 2 cc Marcaine using sterile technique.  There were no complications and she tolerated the injection well. 4.    Injection of the left sacroiliac joint with 40 mg Depo-Medrol and 2 cc Marcaine using sterile technique.  She tolerated the procedure well and there were no complications. 5.   Renew medications  6.   Rtc 4 months, sooner if worse  Page Pucciarelli A. Felecia Shelling, MD, PhD 04/16/5282, 1:32 PM Certified in Neurology, Clinical Neurophysiology, Sleep Medicine, Pain Medicine and  Neuroimaging  [facility] [address] [place], [place] [address] [phone]

## 2017-09-14 ENCOUNTER — Telehealth: Payer: Self-pay | Admitting: Neurology

## 2017-09-14 NOTE — Telephone Encounter (Signed)
Pt is complaining of pain in 2 different places today than yesterday. She said the shot yesterday did help. Please call to advise

## 2017-09-14 NOTE — Telephone Encounter (Signed)
Spoke with Sheri Burns this morning.  She sts. pain is about 50% better since inj. given at ov yesterday, but she still has 2 spots that, if she puts pressure on them, reproduces pain.  I have explained that RAS can't give another inj. right now, but that the steroid should continue to work to reduce inflammation, so hopefully will continue to see pain relief with it.  I spoke with RSA and he was agreeable with this.  Sheri Burns verbalized understanding of same/fim

## 2017-09-28 ENCOUNTER — Ambulatory Visit
Admission: RE | Admit: 2017-09-28 | Discharge: 2017-09-28 | Disposition: A | Discharge status: Home / Self Care | Payer: PPO | Source: Ambulatory Visit | Attending: Obstetrics and Gynecology | Admitting: Obstetrics and Gynecology

## 2017-09-28 DIAGNOSIS — Z1231 Encounter for screening mammogram for malignant neoplasm of breast: Secondary | ICD-10-CM | POA: Diagnosis not present

## 2017-09-28 IMAGING — MG DIGITAL SCREENING BILATERAL MAMMOGRAM WITH TOMO AND CAD
8 series · 9 of 24 positions shown · non-contrast
Comparison: Previous exam(s).

CLINICAL DATA: Screening.

EXAM:
DIGITAL SCREENING BILATERAL MAMMOGRAM WITH TOMO AND CAD

[R CC synth-2D]
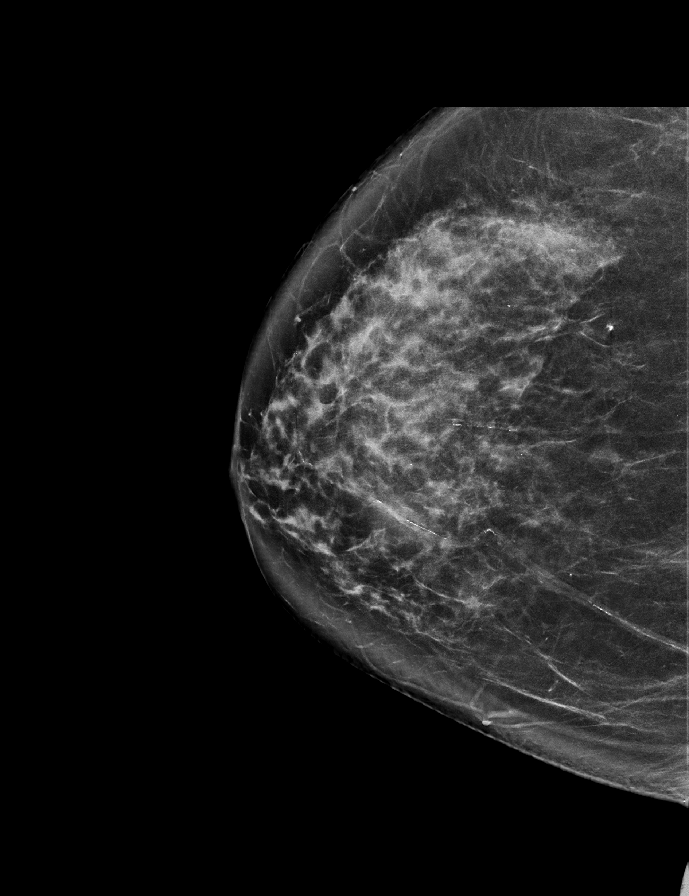

[L CC synth-2D]
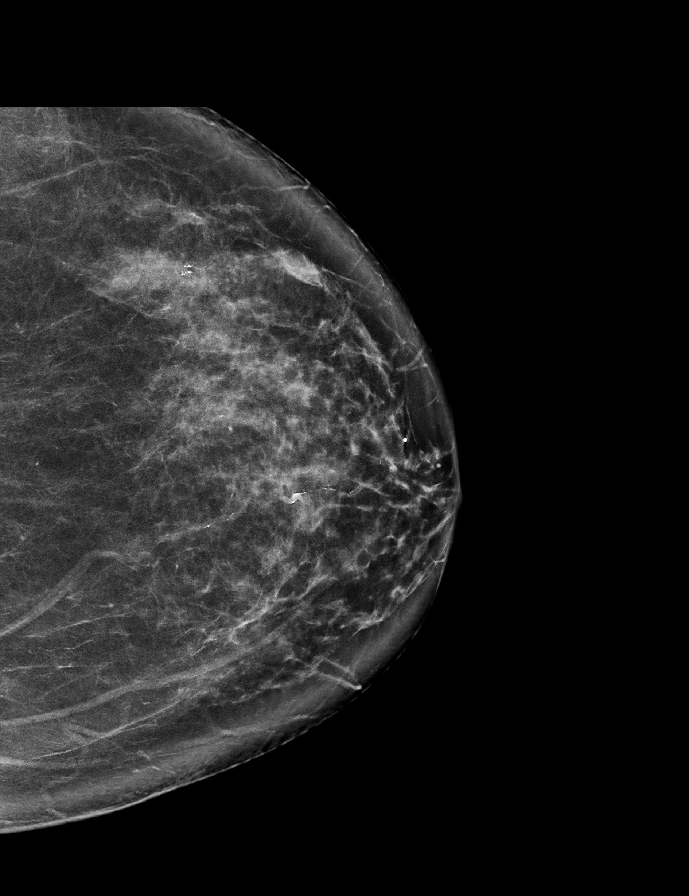

[L MLO synth-2D]
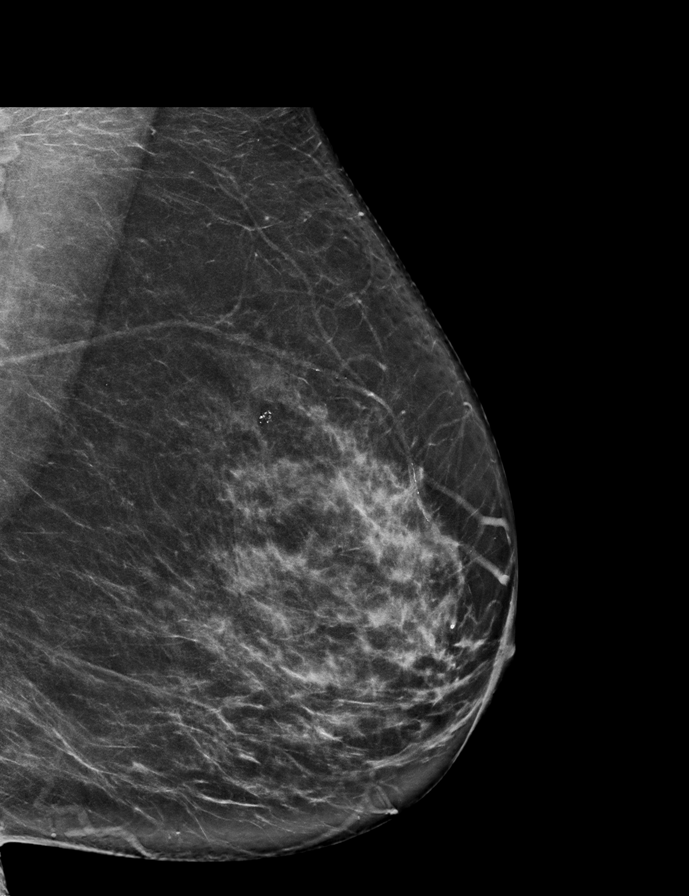

[R MLO synth-2D]
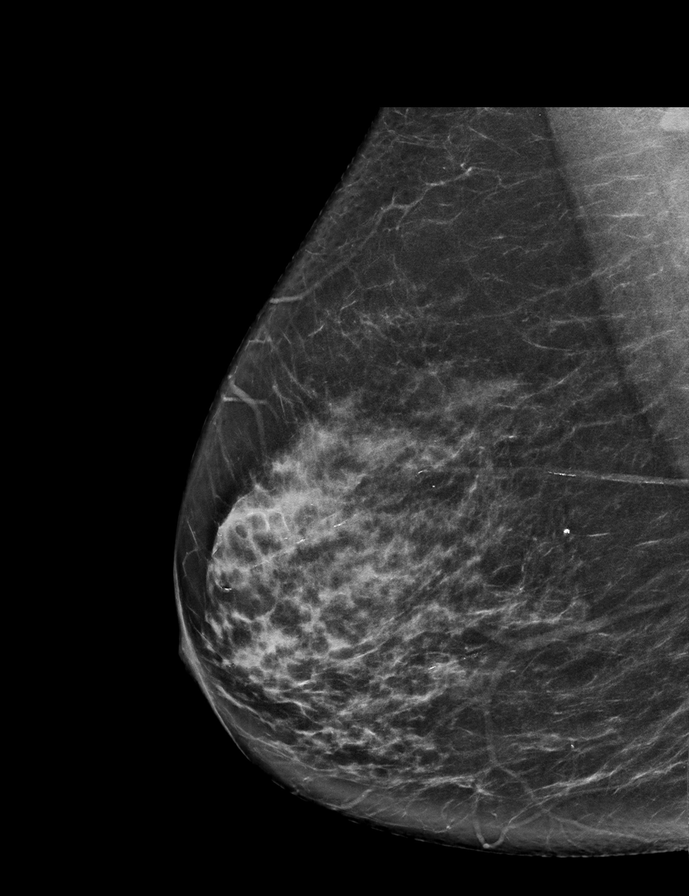

[L MLO tomo · 2 of 74 frames shown]
[frame 24/74]
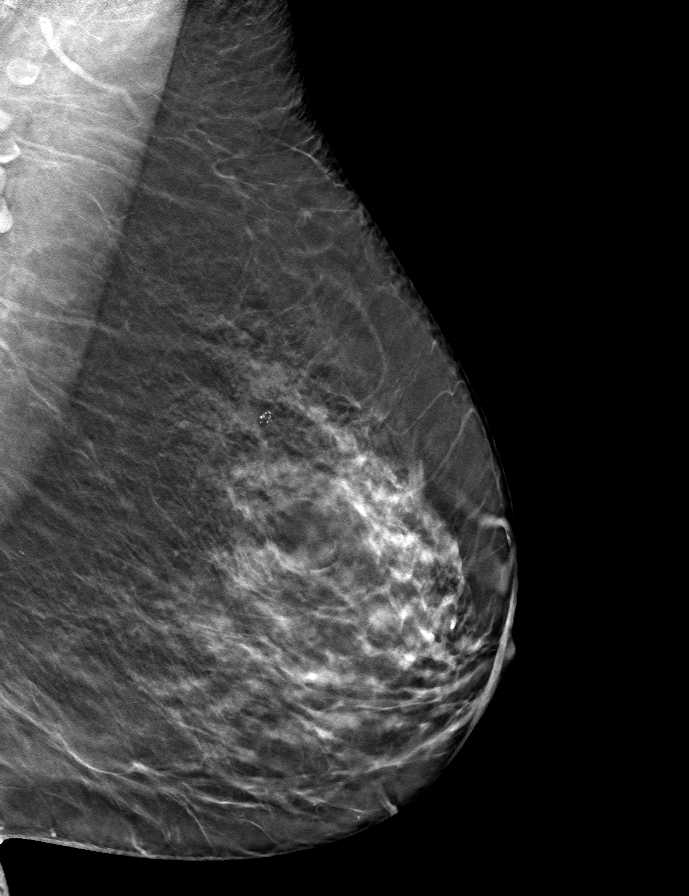
[frame 37/74]
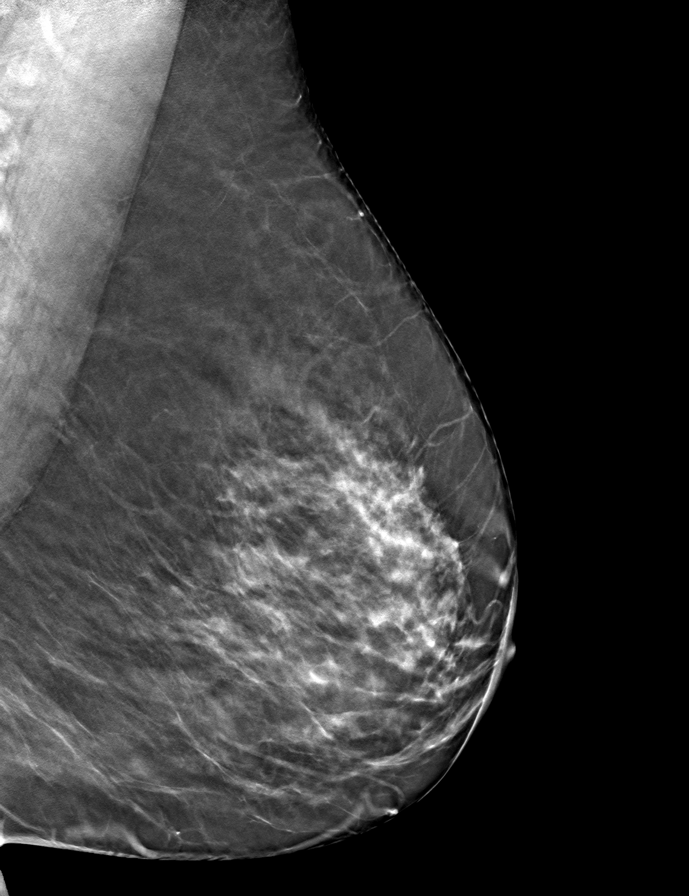

[R CC tomo · tomo slice 37/74.0]
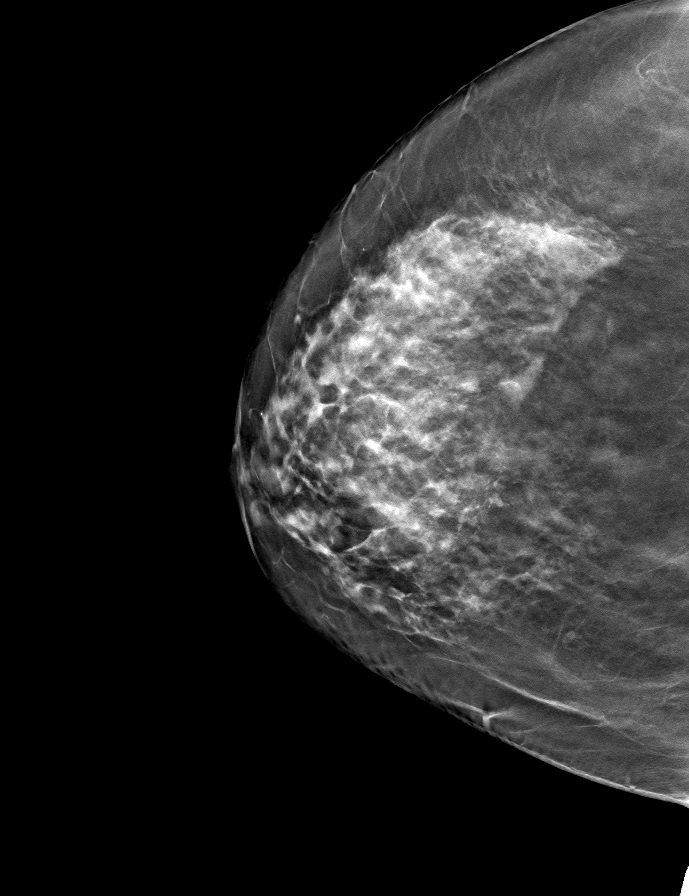

[L CC tomo · tomo slice 39/77.0]
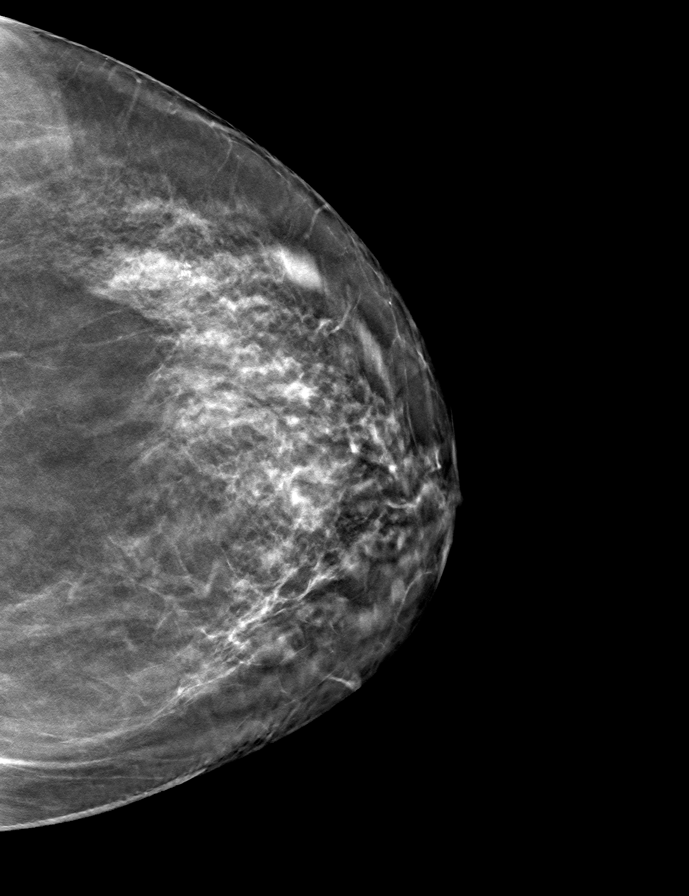

[R MLO tomo · tomo slice 37/72.0]
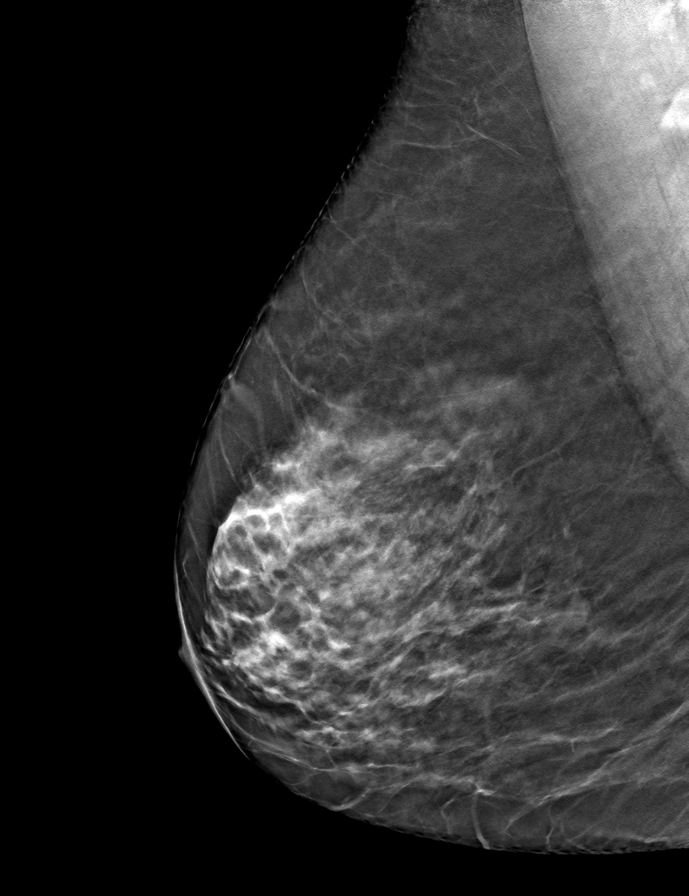

[9 of 24 positions shown; findings below may reference images not displayed]

ACR Breast Density Category c: The breast tissue is heterogeneously
dense, which may obscure small masses.
FINDINGS: There are no findings suspicious for malignancy. Images were
processed with CAD.
IMPRESSION: No mammographic evidence of malignancy. A result letter of this
screening mammogram will be mailed directly to the patient.

RECOMMENDATION:
Screening mammogram in one year. (Code:[5V])

BI-RADS CATEGORY  1: Negative.

## 2017-10-11 ENCOUNTER — Other Ambulatory Visit: Payer: Self-pay | Admitting: Neurology

## 2017-10-11 MED ORDER — AMPHETAMINE-DEXTROAMPHETAMINE 20 MG PO TABS: ORAL_TABLET | ORAL | 0 refills | Status: DC

## 2017-10-11 NOTE — Telephone Encounter (Signed)
Patient requesting refill of amphetamine-dextroamphetamine (ADDERALL) [facility] [address].

## 2017-10-11 NOTE — Addendum Note (Signed)
Addended by: France Ravens I on: 10/11/2017 11:13 AM   Modules accepted: Orders

## 2017-10-18 ENCOUNTER — Ambulatory Visit (INDEPENDENT_AMBULATORY_CARE_PROVIDER_SITE_OTHER): Payer: PPO | Admitting: Neurology

## 2017-10-18 ENCOUNTER — Encounter (INDEPENDENT_AMBULATORY_CARE_PROVIDER_SITE_OTHER): Payer: Self-pay | Admitting: Neurology

## 2017-10-18 DIAGNOSIS — Z0289 Encounter for other administrative examinations: Secondary | ICD-10-CM

## 2017-10-18 DIAGNOSIS — R208 Other disturbances of skin sensation: Secondary | ICD-10-CM | POA: Diagnosis not present

## 2017-10-18 DIAGNOSIS — G5601 Carpal tunnel syndrome, right upper limb: Secondary | ICD-10-CM

## 2017-10-18 DIAGNOSIS — G5603 Carpal tunnel syndrome, bilateral upper limbs: Secondary | ICD-10-CM

## 2017-10-18 MED ORDER — FENTANYL 25 MCG/HR TD PT72: 25.0000 ug | MEDICATED_PATCH | TRANSDERMAL | 0 refills | Status: DC

## 2017-10-18 NOTE — Progress Notes (Signed)
Full Name: Sheri Burns Gender: Female MRN #: [id number] Date of Birth: [birth year]    Visit Date: 10/18/2017 09:11 Age: 62 Years 28 Months Old Examining Physician: Arlice Colt, MD  Referring Physician: Felecia Shelling, MD     History: Sheri Burns is a 62 year old woman with neck pain, right arm pain and right wrist and hand dysesthesias that sometimes wake her up at night.  Additionally, she has multiple sclerosis that has been fairly stable.   On exam, strength is 4/5 in the right APB muscle and 4+/5 in the left APB muscle.  Strength was 5/5 elsewhere.  Nerve conduction studies: The right median motor response had a delayed distal latency across the wrist with a reduced amplitude and normal forearm conduction.  The left median motor response and bilateral ulnar motor responses were normal.  The right median sensory response was slowed across the wrist with a reduced amplitude.  The left median sensory response was minimally slowed across the wrist with normal amplitude.  The ulnar sensory responses were normal bilaterally.  Ulnar F-wave responses were normal.  Electromyography: Needle EMG of selected muscles of the right arm was.  Motor unit action potentials and recruitment were normal in the deltoid, triceps, biceps, EDC and first DIO muscles.    The right APB muscle showed mild chronic denervation.  There was no acute denervation in any of the muscles tested.  Impression: This NCV/EMG study shows the following: 1.    Moderately severe right median neuropathy at the wrist (carpal tunnel syndrome).   Chronic denervation changes were noted in the APB muscle. 2.    Borderline left median neuropathy at the wrist. 3.    There is no evidence of a significant cervical radiculopathy on the right.  Nuala Chiles A. Felecia Shelling, MD, PhD, FAAN Certified in Neurology, Clinical Neurophysiology, Sleep Medicine, Pain Medicine and Neuroimaging Director, [place] [facility] [address], [place], [place] [address] [phone]  Clinical note: She will be referred to orthopedics for right carpal tunnel release.  Prescriptions were refilled during this visit.   -- RAS         MNC    Nerve / Sites Muscle Latency Ref. Amplitude Ref. Rel Amp Segments Distance Velocity Ref. Area    ms ms mV mV %  cm m/s m/s mVms  R Median - APB     Wrist APB 5.2 ?4.4 3.5 ?4.0 100 Wrist - APB 7   10.0     Upper arm APB 9.5  3.5  98.7 Upper arm - Wrist 22 51 ?49 10.0  L Median - APB     Wrist APB 4.1 ?4.4 5.0 ?4.0 100 Wrist - APB 7   16.2     Upper arm APB 7.9  4.1  82.4 Upper arm - Wrist 21 55 ?49 13.7  R Ulnar - ADM     Wrist ADM 3.0 ?3.3 6.1 ?6.0 100 Wrist - ADM 7   15.7     B.Elbow ADM 6.7  5.4  87.3 B.Elbow - Wrist 20 54 ?49 14.9     A.Elbow ADM 8.5  4.8  88.7 A.Elbow - B.Elbow 10 55 ?49 13.5         A.Elbow - Wrist      L Ulnar - ADM     Wrist ADM 2.7 ?3.3 6.0 ?6.0 100 Wrist - ADM 7   17.0     B.Elbow ADM 6.1  5.2  85.8 B.Elbow - Wrist 20 58 ?49 15.6     A.Elbow ADM 7.7  4.9  93.9 A.Elbow - B.Elbow 10 62 ?49 15.1         A.Elbow - Wrist                 SNC    Nerve / Sites Rec. Site Peak Lat Ref.  Amp Ref. Segments Distance Peak Diff Ref.    ms ms V V  cm ms ms  L Median, Ulnar - Transcarpal comparison     Median Palm Wrist 2.5 ?2.2 45 ?35 Median Palm - Wrist 8       Ulnar Palm Wrist 2.0 ?2.2 10 ?12 Ulnar Palm - Wrist 8          Median Palm - Ulnar Palm  0.5 ?0.4  R Median - Orthodromic (Dig II, Mid palm)     Dig II Wrist 4.3 ?3.4 4 ?10 Dig II - Wrist 13    L Median - Orthodromic (Dig II, Mid palm)     Dig II Wrist 3.4 ?3.4 15 ?10 Dig II - Wrist 13    R Ulnar - Orthodromic, (Dig V, Mid palm)     Dig V Wrist 2.7 ?3.1 11 ?5 Dig V - Wrist 11    L Ulnar - Orthodromic, (Dig V, Mid palm)     Dig V Wrist 2.8 ?3.1 6 ?5 Dig V - Wrist 32                 F  Wave    Nerve F Lat Ref.   ms ms  R Ulnar - ADM 27.1  ?32.0  L Ulnar - ADM 25.3 ?32.0         EMG full       EMG Summary Table    Spontaneous MUAP Recruitment  Muscle IA Fib PSW Fasc Other Amp Dur. Poly Pattern  R. Deltoid Normal None None None _______ Normal Normal Normal Normal  R. Triceps brachii Normal None None None _______ Normal Normal Normal Normal  R. Biceps brachii Normal None None None _______ Normal Normal Normal Normal  R. Extensor digitorum communis Normal None None None _______ Normal Normal Normal Normal  R. First dorsal interosseous Normal None None None _______ Normal Normal Normal Normal  R. Abductor pollicis brevis Normal None None None _______ Normal Increased 1+ Reduced

## 2017-10-25 ENCOUNTER — Ambulatory Visit: Payer: PPO | Admitting: Neurology

## 2017-10-30 ENCOUNTER — Other Ambulatory Visit: Payer: Self-pay | Admitting: Neurology

## 2017-11-14 ENCOUNTER — Other Ambulatory Visit: Payer: Self-pay

## 2017-11-14 ENCOUNTER — Telehealth: Payer: Self-pay | Admitting: Neurology

## 2017-11-14 MED ORDER — AMPHETAMINE-DEXTROAMPHETAMINE 20 MG PO TABS: ORAL_TABLET | ORAL | 0 refills | Status: DC

## 2017-11-14 NOTE — Telephone Encounter (Signed)
Refill adderall sign and printed by work in pm Dr. Leonie Man. Dr Felecia Shelling the md is out of office. Rx at front desk for pick up. MD unable to send electronically.

## 2017-11-14 NOTE — Telephone Encounter (Signed)
Pt has called for a refill on her amphetamine-dextroamphetamine (ADDERALL) 20 MG tablet She is asking [facility]  [id number] [place] [place] [address] [phone] (Phone) [phone] (Fax)

## 2017-11-15 NOTE — Telephone Encounter (Signed)
Pt called [facility] stating she took her last dose today.

## 2017-11-16 ENCOUNTER — Telehealth: Payer: Self-pay | Admitting: *Deleted

## 2017-11-16 MED ORDER — AMPHETAMINE-DEXTROAMPHETAMINE 20 MG PO TABS: ORAL_TABLET | ORAL | 0 refills | Status: DC

## 2017-11-16 NOTE — Telephone Encounter (Signed)
Patient would like a call back to discuss Rx amphetamine-dextroamphetamine (ADDERALL) 20 MG tablet. I advised request sent to Dr. Krista Blue for approval since Dr. Felecia Shelling is out of the office. She is at pharmacy now and Rx is not there. Please call patient.

## 2017-11-16 NOTE — Telephone Encounter (Signed)
Spoke with Lysbeth Galas who sts. since she called, she received a call from her pharmacy stating they have her Adderall rxfim

## 2017-11-17 DIAGNOSIS — G5601 Carpal tunnel syndrome, right upper limb: Secondary | ICD-10-CM | POA: Diagnosis not present

## 2017-11-30 ENCOUNTER — Telehealth: Payer: Self-pay | Admitting: Neurology

## 2017-11-30 MED ORDER — FENTANYL 25 MCG/HR TD PT72: 25.0000 ug | MEDICATED_PATCH | TRANSDERMAL | 0 refills | Status: DC

## 2017-11-30 NOTE — Telephone Encounter (Signed)
Pt calling for her written prescription for fentaNYL (DURAGESIC) 25 MCG/HR patch

## 2017-11-30 NOTE — Telephone Encounter (Signed)
Rx. awaiting RAS sig/fim 

## 2017-11-30 NOTE — Telephone Encounter (Signed)
Rx. up front [facility]/fim 

## 2017-11-30 NOTE — Addendum Note (Signed)
Addended by: France Ravens I on: 11/30/2017 02:19 PM   Modules accepted: Orders

## 2017-12-05 DIAGNOSIS — G5601 Carpal tunnel syndrome, right upper limb: Secondary | ICD-10-CM | POA: Diagnosis not present

## 2017-12-15 ENCOUNTER — Other Ambulatory Visit: Payer: Self-pay | Admitting: Neurology

## 2017-12-15 DIAGNOSIS — G2581 Restless legs syndrome: Secondary | ICD-10-CM | POA: Diagnosis not present

## 2017-12-15 DIAGNOSIS — F325 Major depressive disorder, single episode, in full remission: Secondary | ICD-10-CM | POA: Diagnosis not present

## 2017-12-15 DIAGNOSIS — M810 Age-related osteoporosis without current pathological fracture: Secondary | ICD-10-CM | POA: Diagnosis not present

## 2017-12-15 DIAGNOSIS — N319 Neuromuscular dysfunction of bladder, unspecified: Secondary | ICD-10-CM | POA: Diagnosis not present

## 2017-12-15 DIAGNOSIS — G35 Multiple sclerosis: Secondary | ICD-10-CM | POA: Diagnosis not present

## 2017-12-15 DIAGNOSIS — Z23 Encounter for immunization: Secondary | ICD-10-CM | POA: Diagnosis not present

## 2017-12-15 DIAGNOSIS — R739 Hyperglycemia, unspecified: Secondary | ICD-10-CM | POA: Diagnosis not present

## 2017-12-15 DIAGNOSIS — E781 Pure hyperglyceridemia: Secondary | ICD-10-CM | POA: Diagnosis not present

## 2017-12-15 DIAGNOSIS — Z79899 Other long term (current) drug therapy: Secondary | ICD-10-CM | POA: Diagnosis not present

## 2017-12-16 DIAGNOSIS — G5601 Carpal tunnel syndrome, right upper limb: Secondary | ICD-10-CM | POA: Diagnosis not present

## 2017-12-16 DIAGNOSIS — Z5189 Encounter for other specified aftercare: Secondary | ICD-10-CM | POA: Diagnosis not present

## 2017-12-19 ENCOUNTER — Telehealth: Payer: Self-pay | Admitting: Neurology

## 2017-12-19 NOTE — Telephone Encounter (Signed)
Sheri Burns with [facility] requesting a new Rx for methocarbamol (ROBAXIN) 500 MG tablet.

## 2017-12-19 NOTE — Telephone Encounter (Signed)
Spoke with Tammy from [facility] and explained pt. should not need a new Robaxin rx. until 01/23/18.  Last rx was 90 day rx. with 1 r[facility]  If pt. has p/u rx., then she should have med thru 11/17.  If she has not picked up rx., there is [facility] that can be [facility]/fim

## 2017-12-27 DIAGNOSIS — M1712 Unilateral primary osteoarthritis, left knee: Secondary | ICD-10-CM | POA: Diagnosis not present

## 2017-12-27 DIAGNOSIS — F325 Major depressive disorder, single episode, in full remission: Secondary | ICD-10-CM | POA: Diagnosis not present

## 2017-12-27 DIAGNOSIS — M19042 Primary osteoarthritis, left hand: Secondary | ICD-10-CM | POA: Diagnosis not present

## 2017-12-27 DIAGNOSIS — M810 Age-related osteoporosis without current pathological fracture: Secondary | ICD-10-CM | POA: Diagnosis not present

## 2017-12-27 DIAGNOSIS — M19041 Primary osteoarthritis, right hand: Secondary | ICD-10-CM | POA: Diagnosis not present

## 2017-12-29 ENCOUNTER — Telehealth: Payer: Self-pay | Admitting: Obstetrics and Gynecology

## 2017-12-29 NOTE — Telephone Encounter (Addendum)
Spoke with Cecilie Lowers at [facility]. Prolia scheduled for delivery to [facility] on 12/30/17. Cecilie Lowers states he will verify with patient prior to shipping.    Per review of Epic, last prolia received 07/14/17. Next prolia due on or after 01/10/18.

## 2017-12-29 NOTE — Telephone Encounter (Signed)
[facility] left two voicemails over lunch stating that the patient's Prolia injection is supposed to ship out today. She has told them to ship it to her house, as opposed to our office. [facility] needs to verify that it is OK to send to her home.

## 2017-12-29 NOTE — Telephone Encounter (Signed)
Call returned to patient. Advised as seen below. Patient states she does not want prolia Rx shipped to [facility]. Patient states she wants prolia shipped to her home, will to be given by PCP/Dr. Laurann Montana.   Advised patient medication is ordered by Dr. Quincy Simmonds, will need to be given at [facility] and documented. Patient states she has advised [facility] pharmacy to ship prolia to her home.   Advised patient will return call to [facility] to cancel delivery of Prolia until I can review with provider. Patient is agreeable.

## 2017-12-29 NOTE — Telephone Encounter (Addendum)
Spoke with Malachy Mood at [facility], was advised Cecilie Lowers is at lunch. Was advised Prolia has been shipped. Was advised per notes, Cecilie Lowers left message for patient after speaking with Sharee Pimple at [facility], [facility] has shipped. Will need new Rx if patient plans to have Prolia given by another provider.

## 2017-12-29 NOTE — Telephone Encounter (Signed)
Call returned to patient with practice administrator to discuss concerns. Patient states she plans to have prolia given at PCP/Dr. Delene Ruffini office, and have him manage BMD and treatments. Patient disconnected call.

## 2017-12-30 NOTE — Telephone Encounter (Signed)
Spoke with April, pharmacist at [facility]. Was advised Prolia Rx should not be delivered to office, they have requested stop delivery from [employer]. Kaylee from quality will reach out to patient today to provide update and confirm what patient would like to do. Should prolia Rx arrive at [facility], they will assist with return.

## 2017-12-30 NOTE — Telephone Encounter (Signed)
Tanzania from [employer] called stating they are trying to work with [employer] to get the package returned to them. If it is delivered they will help with getting it returned.

## 2017-12-30 NOTE — Telephone Encounter (Addendum)
Prolia Rx received in office. Call returned to [facility] to advise on return. Spoke with April, pharmacist. OK to Return prolia via [employer], return label emailed, ok to return on Monday. Keep medication refrigerated. [employer] has contacted patient to notify and confirm how she would like to proceed with Prolia Rx. April states patient plans to contact PCP/Dr. Laurann Montana for new Rx. If any additional questions, may contact April directly at [phone].

## 2017-12-30 NOTE — Telephone Encounter (Signed)
Pharmacy called to confirm receipt of shipping label.

## 2017-12-30 NOTE — Telephone Encounter (Signed)
Call returned to Sheri Burns at [place]. Left message, [employer] return label received and printed.

## 2018-01-02 NOTE — Telephone Encounter (Signed)
Spoke with patient, explained to patient Prolia RX arrived in office on 12/30/17. [place] agreed to accept prolia return, Prolia shipped in their error. Advised Prolia was shipped back to [facility] today via [employer], provided patient with tracking information.   Routing to provider for final review. Patient is agreeable to disposition. Will close encounter.

## 2018-01-02 NOTE — Telephone Encounter (Signed)
Left message to call Sharee Pimple, RN at [place].    Prolia Rx returned to [facility] via [place] on 01/02/18.  Tracking # [id number]  [employer] Receipt to scan

## 2018-01-03 ENCOUNTER — Other Ambulatory Visit: Payer: Self-pay | Admitting: Neurology

## 2018-01-03 MED ORDER — FENTANYL 25 MCG/HR TD PT72: 25.0000 ug | MEDICATED_PATCH | TRANSDERMAL | 0 refills | Status: DC

## 2018-01-03 NOTE — Telephone Encounter (Signed)
I checked drug registry and she last refilled 12/08/17 #10. Not receiving from other MD's. Placed on rx " fill on or after 01/08/18". Last seen 09/13/17 and next f/u 01/26/18. Rx printed, waiting on MD signature.

## 2018-01-03 NOTE — Telephone Encounter (Signed)
Called patient to r/s her 11/13 appt. While on the phone, patient requested for a written prescription for Fentanyl. I told her that I would pass this info along to Dr. Felecia Shelling and nurse.

## 2018-01-03 NOTE — Addendum Note (Signed)
Addended by: Rossie Muskrat L on: 01/03/2018 11:18 AM   Modules accepted: Orders

## 2018-01-03 NOTE — Telephone Encounter (Signed)
Called and spoke with pt. Advised rx ready for pick up She verbalized understanding.

## 2018-01-13 ENCOUNTER — Other Ambulatory Visit: Payer: Self-pay | Admitting: Neurology

## 2018-01-13 MED ORDER — AMPHETAMINE-DEXTROAMPHETAMINE 20 MG PO TABS: ORAL_TABLET | ORAL | 0 refills | Status: DC

## 2018-01-13 NOTE — Telephone Encounter (Signed)
Pt requesting refills for amphetamine-dextroamphetamine (ADDERALL) [facility]

## 2018-01-16 ENCOUNTER — Ambulatory Visit: Payer: PPO | Admitting: Neurology

## 2018-01-18 ENCOUNTER — Ambulatory Visit: Payer: PPO | Admitting: Neurology

## 2018-01-19 ENCOUNTER — Ambulatory Visit: Payer: PPO | Admitting: Neurology

## 2018-01-19 DIAGNOSIS — M810 Age-related osteoporosis without current pathological fracture: Secondary | ICD-10-CM | POA: Diagnosis not present

## 2018-01-19 DIAGNOSIS — Z23 Encounter for immunization: Secondary | ICD-10-CM | POA: Diagnosis not present

## 2018-01-26 ENCOUNTER — Ambulatory Visit (INDEPENDENT_AMBULATORY_CARE_PROVIDER_SITE_OTHER): Payer: PPO | Admitting: Neurology

## 2018-01-26 ENCOUNTER — Encounter: Payer: Self-pay | Admitting: Neurology

## 2018-01-26 VITALS — BP 135/78 | HR 96 | Wt 133.0 lb

## 2018-01-26 DIAGNOSIS — F418 Other specified anxiety disorders: Secondary | ICD-10-CM | POA: Diagnosis not present

## 2018-01-26 DIAGNOSIS — G5601 Carpal tunnel syndrome, right upper limb: Secondary | ICD-10-CM

## 2018-01-26 DIAGNOSIS — Z79899 Other long term (current) drug therapy: Secondary | ICD-10-CM

## 2018-01-26 DIAGNOSIS — R208 Other disturbances of skin sensation: Secondary | ICD-10-CM | POA: Diagnosis not present

## 2018-01-26 DIAGNOSIS — R260 Ataxic gait: Secondary | ICD-10-CM

## 2018-01-26 DIAGNOSIS — G35 Multiple sclerosis: Secondary | ICD-10-CM | POA: Diagnosis not present

## 2018-01-26 MED ORDER — FENTANYL 25 MCG/HR TD PT72: 25.0000 ug | MEDICATED_PATCH | TRANSDERMAL | 0 refills | Status: DC

## 2018-01-26 NOTE — Progress Notes (Signed)
[facility]  PATIENT: Sheri Burns DOB: 05-18-1955  REFERRING CLINICIAN: Thressa Sheller  HISTORY FROM: Patient REASON FOR VISIT: MS   HISTORICAL  CHIEF COMPLAINT:  Chief Complaint  Patient presents with  . Follow-up    RM 13, alone. Last seen 09/2017. Taking [place]. tolerating well.     HISTORY OF PRESENT ILLNESS:  Sheri Burns is a 62 year old woman with MS.     Update 01/26/2018: She feels her MS has been stable.   She is on leflunomide (had high copay on Aubagio). Walking is the same as last visit (a little off balanced).   Leg strength is the same.   The left leg is a little weak and sometimes gives way.       She has continued pain, helped by Fentanyl.     She has fatigue and MS related ADD helped by Adderall.     She just had her CTS surgery last month and she felt better the next day.   Pain and numbness resolved completely.   She saw Dr. Caralyn Guile  Impression (10/18/2017): This NCV/EMG study shows the following: 1.    Moderately severe right median neuropathy at the wrist (carpal tunnel syndrome).   Chronic denervation changes were noted in the APB muscle. 2.    Borderline left median neuropathy at the wrist. 3.    There is no evidence of a significant cervical radiculopathy on the right.   Update 09/13/2017: She is having more left buttock/hip pain.  Pain will sometimes radiate to the toes but more recently is radiating to the front of the thigh.   Pain is worse if she leans over or bends to the side while standing.    Years ago, she was receiving ESI's for LB and right leg pain.      She is on fentanyl 25 mg patches and tolerates them well.   No drug seeking behavior and NCCSRS has been reviewed in the past.     Her MS is stable.   She is on [place] and tolerates it well.   She has no recent exacerbations.     She has left leg mild weakness and dysesthesia.  She is noting numbness in both hands..    Her vision is fine.    She has fatigue.    Sleep is poor due to sleep maintenance insomnia.     She takes clonazepam at night.  Update 06/22/2017: She feels her MS is stable.   She is on leflunomide and tolerates it well.   She has no recent exacerbations.   She notes mild reduced balance but gait is stable.   No falls.    Strength is slightly weak in the left leg and she has left > right leg dysesthesias.    She also has sciatic type pain on the left.   She is on fentanyl 25 mcg and clonazepam and tramadol qid  Sheri Burns is helping her bladder and she tolerates it well.  However, when she was coughing a lot associated with a mild pneumonia she did have some leakage   vision is doing ok.    She has sleep maintenance insomnia waking up several times every night.   She has some daytime fatigue.    She notes some depression and has stess with husband's illness.    Update 02/07/2017:    Her MS is stable.   She tolerates the Leflunomide well.   She has not had any exacerbation on it.  She feels her gait is stable. At times her legs are tired and balance can be slightly off but she has not had any falls. There will be some numbness and tingling in the left leg  She is still having left leg pain.   It radiates from the buttock into the left leg to the foot (top > bottom).   She denies weakness.  No bladder changes.    Years ago, she had a couple ESI's with benefit.   She got 2 month benefit from a piriformis muscle injection at the last visit.  Fentanyl helps the pain.   Mood is stable. She notes fatigue and attentional deficits, both help with Adderall.   From 08/12/2016: Pain:   She reports new pain in the left buttock that radiates down the leg. She also has numbness in the left anterolateral thigh and some pain in the lateral groin region.   In the past, she had similar pain in the right buttock that improved after trigger point injection.  MS:   Because her Aubagio co-pay was very high, I placed her on leflunomide. She is tolerating it well and  has had no MS exacerbation.  Her last MRI of the brain was some are 2016 and we discussed getting another one to determine if she is having any subclinical activity that would make [place] consider a different medication.  Gait/strength/sensation: She reports that her gait is stable. She still notes mild weakness in her legs and mild problems with her balance. She notes numbness and sometimes painful tingling in the left leg.  Bladder:   She denies any change in her bladder.   Fatigue: She continues to report difficulties with physical and cognitive fatigue. This is improved with Adderall  MS History:  She was diagnosed with multiple sclerosis more than 20 years ago. Initially, she was treated with Betaseron then Copaxone but has not been on any disease modifying therapy for about 8 years. During this time, she has had occasional  Exacerbations with more fatigue and gait issues.  She also has noted mild cognitive dysfunction worsening over the past 5 or 6 years. Her MRI of the brain performed 08/31/2012 shows foci that are predominantly periventricular. They are consistent with the diagnosis of multiple sclerosis. When this MRI was compared to an MRI dated 10/12/2010, there was one additional small periventricular focus. There were no acute findings on either MRI.   In November 2015, she had an exacerbation with severe fatigue, worsened gait and dysphagia.    She started Aubagio 09/09/14.        REVIEW OF SYSTEMS:  Constitutional: No fevers, chills, sweats, or change in appetite.  She notes a lot of fatigue. She is sleeping well. Eyes: No visual changes, double vision, eye pain Ear, nose and throat: No hearing loss, ear pain, nasal congestion, sore throat Cardiovascular: No chest pain, palpitations Respiratory:  No shortness of breath at rest or with exertion.   No wheezes GastrointestinaI: No nausea, vomiting, diarrhea.  Has constipation Genitourinary:  see above. Musculoskeletal:  reports pain in  knees.   Reports neck pain and back pain Integumentary: No rash, pruritus, skin lesions Neurological: as above Psychiatric: Depression and  Anxiety noted Endocrine: No palpitations, diaphoresis, change in appetite, change in weigh or increased thirst Hematologic/Lymphatic:  No anemia, purpura, petechiae. Allergic/Immunologic: No itchy/runny eyes, nasal congestion, recent allergic reactions, rashes  ALLERGIES: Allergies  Allergen Reactions  . Codeine Nausea And Vomiting    vomiting  . Demerol [Meperidine] Nausea  And Vomiting  . Ivp Dye [Iodinated Diagnostic Agents]   . Penicillins   . Stadol [Butorphanol] Other (See Comments)    Per patient psychotic event  . Sulfonamide Derivatives Hives  . Clindamycin/Lincomycin Rash  . Gadolinium Derivatives Hives    HOME MEDICATIONS: Outpatient Medications Prior to Visit  Medication Sig Dispense Refill  . acetaminophen (TYLENOL) 500 MG tablet Take 500 mg by mouth 4 (four) times daily.    Marland Kitchen amphetamine-dextroamphetamine (ADDERALL) 20 MG tablet Take 1 tablet twice daily as needed. 60 tablet 0  . Calcium Carb-Cholecalciferol (CALCIUM 600 + D PO) Take 1 tablet by mouth daily.    . clonazePAM (KLONOPIN) 1 MG tablet TAKE 1 TABLET BY MOUTH THREE TIMES A DAY AS NEEDED 90 tablet 2  . denosumab (PROLIA) 60 MG/ML SOSY injection Inject 60 mg into the skin every 6 (six) months. 1 each 1  . DULoxetine (CYMBALTA) 60 MG capsule TAKE 1 CAPSULE (60 MG TOTAL) BY MOUTH DAILY. 90 capsule 3  . fesoterodine (TOVIAZ) 4 MG TB24 tablet Take 4 mg by mouth daily.    Marland Kitchen gabapentin (NEURONTIN) 600 MG tablet TAKE 1 TABLET 4 TIMES A DAY 360 tablet 3  . leflunomide (ARAVA) 20 MG tablet TAKE ONE-HALF TABLET DAILY FOR 2 WEEKS, THEN INCREASE TO ONE WHOLE TABLET DAILY. 90 tablet 3  . methocarbamol (ROBAXIN) 500 MG tablet TAKE 1 TABLET EVERY 8 HOURS AS NEEDED FOR MUSCLE SPASMS 270 tablet 1  . Multiple Vitamin (MULTIVITAMIN) tablet Take 1 tablet by mouth daily.    . traMADol  (ULTRAM) 50 MG tablet TAKE 1 TABLET EVERY 6 HOURS AS NEEDED 120 tablet 5  . fentaNYL (DURAGESIC) 25 MCG/HR patch Place 1 patch (25 mcg total) onto the skin every 3 (three) days. 10 patch 0   No facility-administered medications prior to visit.     PAST MEDICAL HISTORY: Past Medical History:  Diagnosis Date  . Anxiety   . Bulging lumbar disc 1980  . Chronic pansinusitis   . Colon polyp   . Depression   . GERD (gastroesophageal reflux disease)   . Gynecological examination    sees Dr. Elyse Hsu   . Headache(784.0)   . Multiple sclerosis ([place]) since 1992   Dr. Arlice Colt in [facility]  . Multiple thyroid nodules 1995   per pt. resolved on its own follwed by endo then released  . Neurogenic bladder disorder 12/2009   w/ urge & stress inc. dx. Dr. McDiarmid  . Neuropathy   . Osteoarthritis   . Osteoporosis    last DEXA 2018  . Overactive bladder    sees Dr. Nicki Reaper McDiarmid   . Pancreatitis 1977  . Ulcerative proctitis ([place])   . Vision abnormalities     PAST SURGICAL HISTORY: Past Surgical History:  Procedure Laterality Date  . APPENDECTOMY    . breast ruptured  left   ductal cyst  . CHOLECYSTECTOMY    . COLONOSCOPY  01/10/09   repeat in 5 yrs Dr. Wilford Corner  . DILATION AND CURETTAGE OF UTERUS    . LAPAROSCOPY     x 3  . left knee surgery  1978   to remove bone tumor    . NASAL SINUS SURGERY Bilateral 01/13/2015   Procedure: ENDOSCOPIC SINUS SURGERY;  Surgeon: Izora Gala, MD;  Location: [facility];  Service: ENT;  Laterality: Bilateral;  . NASAL SINUS SURGERY  05/2017  . NASAL TURBINATE REDUCTION    . pubo vaginal sling  10/2003  . TONSILLECTOMY    .  vaginal cystocele repair     anterior  . VAGINAL HYSTERECTOMY  1995   AUB & Endometriosis  . widom teeth removal      FAMILY HISTORY: Family History  Problem Relation Age of Onset  . Osteoporosis Mother        severe  . Cancer Mother 25       colon /Breast also m. niece at 90's  .  Breast cancer Mother   . Osteopenia Sister        X 2  . Breast cancer Other     SOCIAL HISTORY:  Social History   Socioeconomic History  . Marital status: Married    Spouse name: Jori Moll  . Number of children: 1  . Years of education: 1  . Highest education level: Not on file  Occupational History  . Occupation: Nurse, learning disability: UNEMPLOYED  . Occupation: [employer] Engineer, mining Needs  . Financial resource strain: Not on file  . Food insecurity:    Worry: Not on file    Inability: Not on file  . Transportation needs:    Medical: Not on file    Non-medical: Not on file  Tobacco Use  . Smoking status: Former Smoker    Packs/day: 1.00    Years: 5.00    Pack years: 5.00    Types: Cigarettes    Last attempt to quit: 03/09/1979    Years since quitting: 38.9  . Smokeless tobacco: Never Used  Substance and Sexual Activity  . Alcohol use: No  . Drug use: No  . Sexual activity: Not Currently    Partners: Male    Birth control/protection: Surgical  Lifestyle  . Physical activity:    Days per week: Not on file    Minutes per session: Not on file  . Stress: Not on file  Relationships  . Social connections:    Talks on phone: Not on file    Gets together: Not on file    Attends religious service: Not on file    Active member of club or organization: Not on file    Attends meetings of clubs or organizations: Not on file    Relationship status: Not on file  . Intimate partner violence:    Fear of [place] or ex partner: Not on file    Emotionally abused: Not on file    Physically abused: Not on file    Forced sexual activity: Not on file  Other Topics Concern  . Not on file  Social History Narrative  . Not on file     PHYSICAL EXAM  Vitals:   01/26/18 1331  BP: 135/78  Pulse: 96  Weight: 133 lb (60.3 kg)    Body mass index is 21.96 kg/m.   General: The patient is well-developed and well-nourished and in no acute distress.    Musculoskeletal:    No wrist tenderness now. \   Neurologic Exam  Mental status: The patient is alert and oriented x 3 at the time of the examination. The patient has apparent normal recent and remote memory, with an apparently normal attention span and concentration ability.   Speech is normal.  Cranial nerves: Extraocular movements are full.  Facial strength and sensation is normal.  Trapezius strength is strong..  Motor:  Muscle bulk is normal and tone is increased in legs. Strength is  5 / 5 in all 4 extremities except for 4+/5 APB strength (bilaterally)  Sensory: She has decreased sensation to touch temperature  and vibration in the left leg relative to the right leg. Now has normal hand sensation bilaterally  Coordination: Cerebellar testing shows mildly reduced heel to shin but normal finger to nose  Gait and station: Station is stable.  The gait is mildly wide.  Tandem gait is wide. .  Romberg sign is negative.   Reflexes: Deep tendon reflexes are symmetric and fairly normal in the arms. However, she has increased reflexes in the legs with spread at the knees and a couple beats of nonsustained clonus in the ankles...     DIAGNOSTIC DATA (LABS, IMAGING, TESTING) - I reviewed patient records, labs, notes, testing and imaging myself where available.      ASSESSMENT AND PLAN  MULTIPLE SCLEROSIS - Plan: CBC with Differential/Platelet, Comprehensive metabolic panel  Right carpal tunnel syndrome  Depression with anxiety  Ataxic gait  High risk medication use - Plan: CBC with Differential/Platelet, Comprehensive metabolic panel  Dysesthesia    1.   Continue leflunomide.  Check labs. 2.   Continue Adderall  IR for fatigue and ADD.  Renew Fentanyl patch.   NCCS DB reviewed and she is not getting medications from other doctors. 3.   Continue other  medications  4.   Rtc 4 months, sooner if worse  Taysom Glymph A. Felecia Shelling, MD, PhD 78/24/2353, 6:14 PM Certified in Neurology, Clinical Neurophysiology,  Sleep Medicine, Pain Medicine and Neuroimaging  [facility] [address] [place], [place] [address] [phone]

## 2018-01-27 ENCOUNTER — Telehealth: Payer: Self-pay | Admitting: *Deleted

## 2018-01-27 LAB — CBC WITH DIFFERENTIAL/PLATELET
BASOS: 2 %
Basophils Absolute: 0.1 10*3/uL (ref 0.0–0.2)
EOS (ABSOLUTE): 0.7 10*3/uL — ABNORMAL HIGH (ref 0.0–0.4)
Eos: 8 %
HEMOGLOBIN: 12.3 g/dL (ref 11.1–15.9)
Hematocrit: 37.3 % (ref 34.0–46.6)
IMMATURE GRANS (ABS): 0 10*3/uL (ref 0.0–0.1)
IMMATURE GRANULOCYTES: 1 %
LYMPHS: 38 %
Lymphocytes Absolute: 3.1 10*3/uL (ref 0.7–3.1)
MCH: 30.8 pg (ref 26.6–33.0)
MCHC: 33 g/dL (ref 31.5–35.7)
MCV: 93 fL (ref 79–97)
MONOCYTES: 11 %
Monocytes Absolute: 0.8 10*3/uL (ref 0.1–0.9)
NEUTROS ABS: 3.3 10*3/uL (ref 1.4–7.0)
NEUTROS PCT: 40 %
Platelets: 393 10*3/uL (ref 150–450)
RBC: 4 x10E6/uL (ref 3.77–5.28)
RDW: 11.4 % — ABNORMAL LOW (ref 12.3–15.4)
WBC: 8 10*3/uL (ref 3.4–10.8)

## 2018-01-27 LAB — COMPREHENSIVE METABOLIC PANEL
ALBUMIN: 4 g/dL (ref 3.6–4.8)
ALT: 15 IU/L (ref 0–32)
AST: 21 IU/L (ref 0–40)
Albumin/Globulin Ratio: 1.3 (ref 1.2–2.2)
Alkaline Phosphatase: 71 IU/L (ref 39–117)
BUN / CREAT RATIO: 12 (ref 12–28)
BUN: 11 mg/dL (ref 8–27)
CALCIUM: 10 mg/dL (ref 8.7–10.3)
CHLORIDE: 99 mmol/L (ref 96–106)
CO2: 27 mmol/L (ref 20–29)
Creatinine, Ser: 0.9 mg/dL (ref 0.57–1.00)
GFR, EST AFRICAN AMERICAN: 79 mL/min/{1.73_m2} (ref >59)
GFR, EST NON AFRICAN AMERICAN: 68 mL/min/{1.73_m2} (ref >59)
Globulin, Total: 3 g/dL (ref 1.5–4.5)
Glucose: 95 mg/dL (ref 65–99)
Potassium: 4.8 mmol/L (ref 3.5–5.2)
Sodium: 138 mmol/L (ref 134–144)
TOTAL PROTEIN: 7 g/dL (ref 6.0–8.5)

## 2018-01-27 NOTE — Telephone Encounter (Signed)
-----   Message from Britt Bottom, MD sent at 01/27/2018  9:03 AM EST ----- Please let the patient know that the lab work is fine.

## 2018-01-27 NOTE — Telephone Encounter (Signed)
Called and spoke with pt. Advised lab work looked fine per Dr. Felecia Shelling. She verbalized understanding.

## 2018-02-13 ENCOUNTER — Other Ambulatory Visit: Payer: Self-pay | Admitting: Neurology

## 2018-02-13 MED ORDER — AMPHETAMINE-DEXTROAMPHETAMINE 20 MG PO TABS: ORAL_TABLET | ORAL | 0 refills | Status: DC

## 2018-02-13 NOTE — Telephone Encounter (Signed)
Pt has called for a refill on her amphetamine-dextroamphetamine (ADDERALL) 20 MG tablet [id number] [place] [place] [address] AT [address]

## 2018-02-13 NOTE — Addendum Note (Signed)
Addended by: France Ravens I on: 02/13/2018 11:44 AM   Modules accepted: Orders

## 2018-02-24 ENCOUNTER — Telehealth: Payer: Self-pay | Admitting: Neurology

## 2018-02-24 MED ORDER — FENTANYL 25 MCG/HR TD PT72: 25.0000 ug | MEDICATED_PATCH | TRANSDERMAL | 0 refills | Status: DC

## 2018-02-24 NOTE — Telephone Encounter (Signed)
Checked drug registry. Pt last refilled rx 02/06/18 #10. Last seen 01/26/18 and next f/u 07/28/18. Rx post dated and printed to fill on or after 03/08/18. Waiting on MD signature

## 2018-02-24 NOTE — Telephone Encounter (Signed)
Rx. up front [facility]/fim 

## 2018-02-24 NOTE — Telephone Encounter (Signed)
Pt request refill for fentaNYL (DURAGESIC) 25 MCG/HR patch. She wants to pick this up on Monday.

## 2018-03-09 DIAGNOSIS — M810 Age-related osteoporosis without current pathological fracture: Secondary | ICD-10-CM | POA: Diagnosis not present

## 2018-03-09 DIAGNOSIS — M19041 Primary osteoarthritis, right hand: Secondary | ICD-10-CM | POA: Diagnosis not present

## 2018-03-09 DIAGNOSIS — M19042 Primary osteoarthritis, left hand: Secondary | ICD-10-CM | POA: Diagnosis not present

## 2018-03-09 DIAGNOSIS — F325 Major depressive disorder, single episode, in full remission: Secondary | ICD-10-CM | POA: Diagnosis not present

## 2018-03-09 DIAGNOSIS — M1712 Unilateral primary osteoarthritis, left knee: Secondary | ICD-10-CM | POA: Diagnosis not present

## 2018-03-16 ENCOUNTER — Other Ambulatory Visit: Payer: Self-pay | Admitting: *Deleted

## 2018-03-16 ENCOUNTER — Telehealth: Payer: Self-pay | Admitting: Neurology

## 2018-03-16 MED ORDER — METHOCARBAMOL 500 MG PO TABS: ORAL_TABLET | ORAL | 1 refills | Status: DC

## 2018-03-16 MED ORDER — DULOXETINE HCL 60 MG PO CPEP: 60.0000 mg | ORAL_CAPSULE | Freq: Every day | ORAL | 3 refills | Status: DC

## 2018-03-16 NOTE — Telephone Encounter (Signed)
Pt is needing a refill on her amphetamine-dextroamphetamine (ADDERALL) 20 MG [facility] [address]

## 2018-03-16 NOTE — Telephone Encounter (Signed)
Drug registry verified for Adderall 20 mg. Last refill was written on 02/16/18 # 30 for a 30 day supply provided by our office. Pt's last o/v was 02/10/18 and next o/v scheduled for 08/01/18.

## 2018-03-17 ENCOUNTER — Other Ambulatory Visit: Payer: Self-pay | Admitting: Neurology

## 2018-03-17 MED ORDER — AMPHETAMINE-DEXTROAMPHETAMINE 20 MG PO TABS: ORAL_TABLET | ORAL | 0 refills | Status: DC

## 2018-04-04 ENCOUNTER — Other Ambulatory Visit: Payer: Self-pay | Admitting: Neurology

## 2018-04-04 MED ORDER — FENTANYL 25 MCG/HR TD PT72: MEDICATED_PATCH | TRANSDERMAL | 0 refills | Status: DC

## 2018-04-04 NOTE — Telephone Encounter (Signed)
Pt is needing a refill for her fentaNYL (DURAGESIC) 25 MCG/HR patch

## 2018-04-04 NOTE — Telephone Encounter (Signed)
Checked drug registry. Pt last refilled rx 03/08/2018 #10. Last seen 01/26/18 and next f/u 07/28/18. Rx post dated and to fill on or after 04/07/2018. Sent request to MD to escribe.

## 2018-04-11 ENCOUNTER — Other Ambulatory Visit: Payer: Self-pay | Admitting: *Deleted

## 2018-04-11 MED ORDER — CLONAZEPAM 1 MG PO TABS: 1.0000 mg | ORAL_TABLET | Freq: Three times a day (TID) | ORAL | 3 refills | Status: DC | PRN

## 2018-04-17 ENCOUNTER — Other Ambulatory Visit: Payer: Self-pay | Admitting: Neurology

## 2018-04-17 DIAGNOSIS — M19042 Primary osteoarthritis, left hand: Secondary | ICD-10-CM | POA: Diagnosis not present

## 2018-04-17 DIAGNOSIS — F325 Major depressive disorder, single episode, in full remission: Secondary | ICD-10-CM | POA: Diagnosis not present

## 2018-04-17 DIAGNOSIS — M1712 Unilateral primary osteoarthritis, left knee: Secondary | ICD-10-CM | POA: Diagnosis not present

## 2018-04-17 DIAGNOSIS — M19041 Primary osteoarthritis, right hand: Secondary | ICD-10-CM | POA: Diagnosis not present

## 2018-04-17 DIAGNOSIS — M810 Age-related osteoporosis without current pathological fracture: Secondary | ICD-10-CM | POA: Diagnosis not present

## 2018-04-17 MED ORDER — AMPHETAMINE-DEXTROAMPHETAMINE 20 MG PO TABS: ORAL_TABLET | ORAL | 0 refills | Status: DC

## 2018-04-17 NOTE — Telephone Encounter (Signed)
Pt is needing a refill for her amphetamine-dextroamphetamine (ADDERALL) 20 MG tablet [facility] [address]

## 2018-04-26 ENCOUNTER — Other Ambulatory Visit: Payer: Self-pay | Admitting: *Deleted

## 2018-04-26 MED ORDER — GABAPENTIN 600 MG PO TABS: ORAL_TABLET | ORAL | 3 refills | Status: DC

## 2018-04-26 MED ORDER — LEFLUNOMIDE 20 MG PO TABS: ORAL_TABLET | ORAL | 3 refills | Status: DC

## 2018-05-02 ENCOUNTER — Other Ambulatory Visit: Payer: Self-pay | Admitting: Neurology

## 2018-05-02 MED ORDER — FENTANYL 25 MCG/HR TD PT72: MEDICATED_PATCH | TRANSDERMAL | 0 refills | Status: DC

## 2018-05-02 NOTE — Telephone Encounter (Signed)
Patient calling requesting refill of fentaNYL ([place]) 25 MCG/HR [facility] [address]. Please call or text her at [phone]

## 2018-05-02 NOTE — Addendum Note (Signed)
Addended by: Hope Pigeon on: 05/02/2018 09:39 AM   Modules accepted: Orders

## 2018-05-09 ENCOUNTER — Encounter: Payer: Self-pay | Admitting: Neurology

## 2018-05-17 ENCOUNTER — Other Ambulatory Visit: Payer: Self-pay

## 2018-05-18 MED ORDER — AMPHETAMINE-DEXTROAMPHETAMINE 20 MG PO TABS: ORAL_TABLET | ORAL | 0 refills | Status: DC

## 2018-05-22 ENCOUNTER — Other Ambulatory Visit: Payer: Self-pay

## 2018-05-25 ENCOUNTER — Other Ambulatory Visit: Payer: Self-pay | Admitting: Neurology

## 2018-05-25 ENCOUNTER — Telehealth: Payer: Self-pay | Admitting: Neurology

## 2018-05-25 DIAGNOSIS — M1712 Unilateral primary osteoarthritis, left knee: Secondary | ICD-10-CM | POA: Diagnosis not present

## 2018-05-25 DIAGNOSIS — F325 Major depressive disorder, single episode, in full remission: Secondary | ICD-10-CM | POA: Diagnosis not present

## 2018-05-25 DIAGNOSIS — M19042 Primary osteoarthritis, left hand: Secondary | ICD-10-CM | POA: Diagnosis not present

## 2018-05-25 DIAGNOSIS — M810 Age-related osteoporosis without current pathological fracture: Secondary | ICD-10-CM | POA: Diagnosis not present

## 2018-05-25 DIAGNOSIS — M19041 Primary osteoarthritis, right hand: Secondary | ICD-10-CM | POA: Diagnosis not present

## 2018-05-25 NOTE — Telephone Encounter (Signed)
Dr. Felecia Shelling- is this something you will approve?

## 2018-05-25 NOTE — Telephone Encounter (Addendum)
Pt is requesting early refill due to panedemic

## 2018-05-25 NOTE — Telephone Encounter (Signed)
Spoke with Dr. Felecia Shelling- he does not want to refill early.

## 2018-05-25 NOTE — Telephone Encounter (Signed)
I called pt and relayed per Dr. Felecia Shelling that we cannot refill rx early since it is controlled. She verbalized understanding.

## 2018-05-25 NOTE — Telephone Encounter (Signed)
[place] Database Verified LR: 04-19-2018 Qty: 120 Pending appointment: 08-07-2018

## 2018-05-26 ENCOUNTER — Other Ambulatory Visit: Payer: Self-pay | Admitting: Neurology

## 2018-05-29 ENCOUNTER — Other Ambulatory Visit: Payer: Self-pay | Admitting: Neurology

## 2018-05-29 ENCOUNTER — Other Ambulatory Visit: Payer: Self-pay | Admitting: *Deleted

## 2018-05-29 MED ORDER — CLONAZEPAM 1 MG PO TABS: 1.0000 mg | ORAL_TABLET | Freq: Three times a day (TID) | ORAL | 1 refills | Status: DC | PRN

## 2018-06-04 ENCOUNTER — Other Ambulatory Visit: Payer: Self-pay

## 2018-06-05 MED ORDER — FENTANYL 25 MCG/HR TD PT72: MEDICATED_PATCH | TRANSDERMAL | 0 refills | Status: DC

## 2018-06-15 ENCOUNTER — Other Ambulatory Visit: Payer: Self-pay | Admitting: Neurology

## 2018-06-15 ENCOUNTER — Other Ambulatory Visit: Payer: Self-pay

## 2018-06-15 MED ORDER — AMPHETAMINE-DEXTROAMPHETAMINE 20 MG PO TABS: ORAL_TABLET | ORAL | 0 refills | Status: DC

## 2018-06-15 NOTE — Telephone Encounter (Signed)
[place] Database Verified LR: 05/18/2018  Qty: 60 Pending appointment: 08/07/2018

## 2018-07-03 ENCOUNTER — Other Ambulatory Visit: Payer: Self-pay

## 2018-07-03 MED ORDER — FENTANYL 25 MCG/HR TD PT72: MEDICATED_PATCH | TRANSDERMAL | 0 refills | Status: DC

## 2018-07-03 NOTE — Telephone Encounter (Signed)
[place] Database Verified LR: 06/06/2018 Qty: 10 Pending appointment: 08/07/2018

## 2018-07-13 ENCOUNTER — Other Ambulatory Visit: Payer: Self-pay

## 2018-07-13 MED ORDER — AMPHETAMINE-DEXTROAMPHETAMINE 20 MG PO TABS: ORAL_TABLET | ORAL | 0 refills | Status: DC

## 2018-07-14 ENCOUNTER — Other Ambulatory Visit: Payer: Self-pay | Admitting: Neurology

## 2018-07-28 ENCOUNTER — Ambulatory Visit: Payer: PPO | Admitting: Neurology

## 2018-08-01 ENCOUNTER — Other Ambulatory Visit: Payer: Self-pay

## 2018-08-01 DIAGNOSIS — M1712 Unilateral primary osteoarthritis, left knee: Secondary | ICD-10-CM | POA: Diagnosis not present

## 2018-08-01 DIAGNOSIS — F325 Major depressive disorder, single episode, in full remission: Secondary | ICD-10-CM | POA: Diagnosis not present

## 2018-08-01 DIAGNOSIS — M810 Age-related osteoporosis without current pathological fracture: Secondary | ICD-10-CM | POA: Diagnosis not present

## 2018-08-01 DIAGNOSIS — M19042 Primary osteoarthritis, left hand: Secondary | ICD-10-CM | POA: Diagnosis not present

## 2018-08-01 DIAGNOSIS — M19041 Primary osteoarthritis, right hand: Secondary | ICD-10-CM | POA: Diagnosis not present

## 2018-08-02 MED ORDER — FENTANYL 25 MCG/HR TD PT72: MEDICATED_PATCH | TRANSDERMAL | 0 refills | Status: DC

## 2018-08-03 ENCOUNTER — Telehealth: Payer: Self-pay | Admitting: *Deleted

## 2018-08-03 NOTE — Telephone Encounter (Signed)
Called pt. She agreed to virtual visit for 08/07/18 appt with Dr. Felecia Shelling.  Pt understands that although there may be some limitations with this type of visit, we will take all precautions to reduce any security or privacy concerns.  Pt understands that this will be treated like an in office visit and we will file with pt's insurance, and there may be a patient responsible charge related to this service.  Emailed link to her at [email or web address]. Confirmed she received email while we were on the phone. Updated med list, pharmacy, allergies on file.

## 2018-08-07 ENCOUNTER — Ambulatory Visit (INDEPENDENT_AMBULATORY_CARE_PROVIDER_SITE_OTHER): Payer: PPO | Admitting: Neurology

## 2018-08-07 ENCOUNTER — Other Ambulatory Visit: Payer: Self-pay

## 2018-08-07 ENCOUNTER — Encounter: Payer: Self-pay | Admitting: Neurology

## 2018-08-07 DIAGNOSIS — E559 Vitamin D deficiency, unspecified: Secondary | ICD-10-CM

## 2018-08-07 DIAGNOSIS — R260 Ataxic gait: Secondary | ICD-10-CM

## 2018-08-07 DIAGNOSIS — R5382 Chronic fatigue, unspecified: Secondary | ICD-10-CM | POA: Diagnosis not present

## 2018-08-07 DIAGNOSIS — G35 Multiple sclerosis: Secondary | ICD-10-CM | POA: Diagnosis not present

## 2018-08-07 DIAGNOSIS — R3915 Urgency of urination: Secondary | ICD-10-CM | POA: Diagnosis not present

## 2018-08-07 DIAGNOSIS — Z79899 Other long term (current) drug therapy: Secondary | ICD-10-CM

## 2018-08-07 DIAGNOSIS — R208 Other disturbances of skin sensation: Secondary | ICD-10-CM

## 2018-08-07 DIAGNOSIS — F418 Other specified anxiety disorders: Secondary | ICD-10-CM | POA: Diagnosis not present

## 2018-08-07 MED ORDER — AMPHETAMINE-DEXTROAMPHETAMINE 20 MG PO TABS: 20.0000 mg | ORAL_TABLET | Freq: Two times a day (BID) | ORAL | 0 refills | Status: DC | PRN

## 2018-08-07 MED ORDER — PREDNISONE 10 MG (21) PO TBPK: ORAL_TABLET | ORAL | 0 refills | Status: DC

## 2018-08-07 NOTE — Progress Notes (Signed)
[facility]  PATIENT: Sheri Burns DOB: [birth year]  REFERRING CLINICIAN: Thressa Burns  HISTORY FROM: Patient REASON FOR VISIT: MS   HISTORICAL  CHIEF COMPLAINT:  Chief Complaint  Patient presents with  . Multiple Sclerosis    on Leflunomide  . Pain    worse in left leg    HISTORY OF PRESENT ILLNESS:  Sheri Burns is a 63 year old woman with MS.     Update 08/07/2018: Virtual Visit via Video Note I connected with Sheri Burns on 08/07/18 at  1:00 PM EDT by a video enabled telemedicine application and verified that I am speaking with the correct person.  I discussed the limitations of evaluation and management by telemedicine and the availability of in person appointments. The patient expressed understanding and agreed to proceed.  History of Present Illness: She feels her MS has been stable.  She denies any definite exacerbation though she has one day of vertigo.   She is on leflunomide (had high copay on Aubagio). Walking is the same as last visit (a little off balanced).   Leg strength is the same.   The left leg is a little weak and sometimes gives way but no falls.     She has pain, helped by Fentanyl, Gabapentin and duloxetine.   Pain is in her back but also in the left leg with a more dysesthetic quality.  She notes more fatigue the last couple months.   She notes having to take care of her 63 yo granddaughter since Sheri Burns.  Her MS related ADD is better with Adderall.  She needs a refill   Bladder is overactive but stable.   Vision is fine.     Her husband had pneumonia in March so they are socially distancing with visits from daughter/grandkids only.  She notes mild depression.  She is on Cymbalta.  She just had her CTS surgery by Dr. Caralyn Burns and she felt better quickly.   Pain and numbness resolved completely.   She is taking Vit D   Observations/Objective: She is a well-developed well-nourished woman in no acute distress.  The head is  normocephalic and atraumatic.  Sclera are anicteric.  Visible skin appears normal.  The neck has a good range of motion.    She is alert and fully oriented with fluent speech and good attention, knowledge and memory.  Extraocular muscles are intact.  Facial strength is normal.    She appears to have normal strength in the arms.  Rapid alternating movements and finger-nose-finger are performed well.  Assessment and Plan: MULTIPLE SCLEROSIS  Ataxic gait  High risk medication use  Chronic fatigue  Depression with anxiety  Vitamin D deficiency  Urinary urgency  Dysesthesia   1.   Continue leflunomide.  We will check labs at her next visit. 2.   Refill Adderall and continue other med's.   The [place] was reviewed and she has been compliant. 3.   Stay active and exercise.   Continue Vit D 4.   Lower back exercises and steroid pack for back pain worsening. 5.   F/u in 4 months or sooner if problems  Follow Up Instructions: I discussed the assessment and treatment plan with the patient. The patient was provided an opportunity to ask questions and all were answered. The patient agreed with the plan and demonstrated an understanding of the instructions.    The patient was advised to call back or seek an in-person evaluation if the symptoms worsen or if  the condition fails to improve as anticipated.  I provided 25 minutes of non-face-to-face time during this encounter.  ____________________________________  Update 01/26/2018: She feels her MS has been stable.   She is on leflunomide (had high copay on Aubagio). Walking is the same as last visit (a little off balanced).   Leg strength is the same.   The left leg is a little weak and sometimes gives way.       She has continued pain, helped by Fentanyl.     She has fatigue and MS related ADD helped by Adderall.     She just had her CTS surgery last month and she felt better the next day.   Pain and numbness resolved completely.   She  saw Dr. Caralyn Burns  Impression (10/18/2017): This NCV/EMG study shows the following: 1.    Moderately severe right median neuropathy at the wrist (carpal tunnel syndrome).   Chronic denervation changes were noted in the APB muscle. 2.    Borderline left median neuropathy at the wrist. 3.    There is no evidence of a significant cervical radiculopathy on the right.   Update 09/13/2017: She is having more left buttock/hip pain.  Pain will sometimes radiate to the toes but more recently is radiating to the front of the thigh.   Pain is worse if she leans over or bends to the side while standing.    Years ago, she was receiving ESI's for LB and right leg pain.      She is on fentanyl 25 mg patches and tolerates them well.   No drug seeking behavior and NCCSRS has been reviewed in the past.     Her MS is stable.   She is on [place] and tolerates it well.   She has no recent exacerbations.     She has left leg mild weakness and dysesthesia.  She is noting numbness in both hands..    Her vision is fine.    She has fatigue.   Sleep is poor due to sleep maintenance insomnia.     She takes clonazepam at night.  Update 06/22/2017: She feels her MS is stable.   She is on leflunomide and tolerates it well.   She has no recent exacerbations.   She notes mild reduced balance but gait is stable.   No falls.    Strength is slightly weak in the left leg and she has left > right leg dysesthesias.    She also has sciatic type pain on the left.   She is on fentanyl 25 mcg and clonazepam and tramadol qid  Sheri Burns is helping her bladder and she tolerates it well.  However, when she was coughing a lot associated with a mild pneumonia she did have some leakage   vision is doing ok.    She has sleep maintenance insomnia waking up several times every night.   She has some daytime fatigue.    She notes some depression and has stess with husband's illness.    Update 02/07/2017:    Her MS is stable.   She tolerates the Leflunomide well.    She has not had any exacerbation on it.      She feels her gait is stable. At times her legs are tired and balance can be slightly off but she has not had any falls. There will be some numbness and tingling in the left leg  She is still having left leg pain.   It radiates from  the buttock into the left leg to the foot (top > bottom).   She denies weakness.  No bladder changes.    Years ago, she had a couple ESI's with benefit.   She got 2 month benefit from a piriformis muscle injection at the last visit.  Fentanyl helps the pain.   Mood is stable. She notes fatigue and attentional deficits, both help with Adderall.   From 08/12/2016: Pain:   She reports new pain in the left buttock that radiates down the leg. She also has numbness in the left anterolateral thigh and some pain in the lateral groin region.   In the past, she had similar pain in the right buttock that improved after trigger point injection.  MS:   Because her Aubagio co-pay was very high, I placed her on leflunomide. She is tolerating it well and has had no MS exacerbation.  Her last MRI of the brain was some are 2016 and we discussed getting another one to determine if she is having any subclinical activity that would make [place] consider a different medication.  Gait/strength/sensation: She reports that her gait is stable. She still notes mild weakness in her legs and mild problems with her balance. She notes numbness and sometimes painful tingling in the left leg.  Bladder:   She denies any change in her bladder.   Fatigue: She continues to report difficulties with physical and cognitive fatigue. This is improved with Adderall  MS History:  She was diagnosed with multiple sclerosis more than 20 years ago. Initially, she was treated with Betaseron then Copaxone but has not been on any disease modifying therapy for about 8 years. During this time, she has had occasional  Exacerbations with more fatigue and gait issues.  She also has noted  mild cognitive dysfunction worsening over the past 5 or 6 years. Her MRI of the brain performed 08/31/2012 shows foci that are predominantly periventricular. They are consistent with the diagnosis of multiple sclerosis. When this MRI was compared to an MRI dated 10/12/2010, there was one additional small periventricular focus. There were no acute findings on either MRI.   In November 2015, she had an exacerbation with severe fatigue, worsened gait and dysphagia.    She started Aubagio 09/09/14.        REVIEW OF SYSTEMS:  Constitutional: No fevers, chills, sweats, or change in appetite.  She notes a lot of fatigue. She is sleeping well. Eyes: No visual changes, double vision, eye pain Ear, nose and throat: No hearing loss, ear pain, nasal congestion, sore throat Cardiovascular: No chest pain, palpitations Respiratory:  No shortness of breath at rest or with exertion.   No wheezes GastrointestinaI: No nausea, vomiting, diarrhea.  Has constipation Genitourinary:  see above. Musculoskeletal:  reports pain in knees.   Reports neck pain and back pain Integumentary: No rash, pruritus, skin lesions Neurological: as above Psychiatric: Depression and  Anxiety noted Endocrine: No palpitations, diaphoresis, change in appetite, change in weigh or increased thirst Hematologic/Lymphatic:  No anemia, purpura, petechiae. Allergic/Immunologic: No itchy/runny eyes, nasal congestion, recent allergic reactions, rashes  ALLERGIES: Allergies  Allergen Reactions  . Amoxicillin Hives and Itching  . Codeine Nausea And Vomiting    vomiting  . Demerol [Meperidine] Nausea And Vomiting  . Ivp Dye [Iodinated Diagnostic Agents]   . Penicillins   . Stadol [Butorphanol] Other (See Comments)    Per patient psychotic event  . Sulfonamide Derivatives Hives  . Clindamycin/Lincomycin Rash  . Gadolinium Derivatives Hives  HOME MEDICATIONS: Outpatient Medications Prior to Visit  Medication Sig Dispense Refill  .  acetaminophen (TYLENOL) 500 MG tablet Take 500 mg by mouth 4 (four) times daily.    . Calcium Carb-Cholecalciferol (CALCIUM 600 + D PO) Take 1 tablet by mouth daily.    . clonazePAM (KLONOPIN) 1 MG tablet Take 1 tablet (1 mg total) by mouth 3 (three) times daily as needed. 270 tablet 1  . denosumab (PROLIA) 60 MG/ML SOSY injection Inject 60 mg into the skin every 6 (six) months. 1 each 1  . DULoxetine (CYMBALTA) 60 MG capsule Take 1 capsule (60 mg total) by mouth daily. 90 capsule 3  . fentaNYL (DURAGESIC) 25 MCG/HR Place 1 patch (25 mcg total) onto the skin every 3 (three) days. 10 patch 0  . fesoterodine (TOVIAZ) 4 MG TB24 tablet Take 4 mg by mouth daily.    Marland Kitchen gabapentin (NEURONTIN) 600 MG tablet TAKE 1 TABLET 4 TIMES A DAY 360 tablet 3  . leflunomide (ARAVA) 20 MG tablet ONE WHOLE TABLET DAILY BY MOUTH 90 tablet 3  . methocarbamol (ROBAXIN) 500 MG tablet TAKE 1 TABLET EVERY 8 HOURS AS NEEDED FOR MUSCLE SPASMS 270 tablet 1  . Multiple Vitamin (MULTIVITAMIN) tablet Take 1 tablet by mouth daily.    . traMADol (ULTRAM) 50 MG tablet TAKE 1 TABLET EVERY SIX HOURS AS NEEDED (8 AM, 12 NOON, 4 PM AND 8 PM) 120 tablet 3  . amphetamine-dextroamphetamine (ADDERALL) 20 MG tablet TAKE 1 TABLET TWICE DAILY AS NEEDED. 60 tablet 0   No facility-administered medications prior to visit.     PAST MEDICAL HISTORY: Past Medical History:  Diagnosis Date  . Anxiety   . Bulging lumbar disc 1980  . Chronic pansinusitis   . Colon polyp   . Depression   . GERD (gastroesophageal reflux disease)   . Gynecological examination    sees Dr. Elyse Hsu   . Headache(784.0)   . Multiple sclerosis ([place]) since 1992   Dr. Arlice Colt in [facility]  . Multiple thyroid nodules 1995   per pt. resolved on its own follwed by endo then released  . Neurogenic bladder disorder 12/2009   w/ urge & stress inc. dx. Dr. McDiarmid  . Neuropathy   . Osteoarthritis   . Osteoporosis    last DEXA 2018  . Overactive bladder     sees Dr. Nicki Reaper McDiarmid   . Pancreatitis 1977  . Ulcerative proctitis ([place])   . Vision abnormalities     PAST SURGICAL HISTORY: Past Surgical History:  Procedure Laterality Date  . APPENDECTOMY    . breast ruptured  left   ductal cyst  . CHOLECYSTECTOMY    . COLONOSCOPY  01/10/09   repeat in 5 yrs Dr. Wilford Corner  . DILATION AND CURETTAGE OF UTERUS    . LAPAROSCOPY     x 3  . left knee surgery  1978   to remove bone tumor    . NASAL SINUS SURGERY Bilateral 01/13/2015   Procedure: ENDOSCOPIC SINUS SURGERY;  Surgeon: Izora Gala, MD;  Location: [facility];  Service: ENT;  Laterality: Bilateral;  . NASAL SINUS SURGERY  05/2017  . NASAL TURBINATE REDUCTION    . pubo vaginal sling  10/2003  . TONSILLECTOMY    . vaginal cystocele repair     anterior  . VAGINAL HYSTERECTOMY  1995   AUB & Endometriosis  . widom teeth removal      FAMILY HISTORY: Family History  Problem Relation Age  of Onset  . Osteoporosis Mother        severe  . Cancer Mother 65       colon /Breast also m. niece at 34's  . Breast cancer Mother   . Osteopenia Sister        X 2  . Breast cancer Other     SOCIAL HISTORY:  Social History   Socioeconomic History  . Marital status: Married    Spouse name: Jori Moll  . Number of children: 1  . Years of education: 18  . Highest education level: Not on file  Occupational History  . Occupation: Nurse, learning disability: UNEMPLOYED  . Occupation: [employer] Engineer, mining Needs  . Financial resource strain: Not on file  . Food insecurity:    Worry: Not on file    Inability: Not on file  . Transportation needs:    Medical: Not on file    Non-medical: Not on file  Tobacco Use  . Smoking status: Former Smoker    Packs/day: 1.00    Years: 5.00    Pack years: 5.00    Types: Cigarettes    Last attempt to quit: 03/09/1979    Years since quitting: 39.4  . Smokeless tobacco: Never Used  Substance and Sexual Activity  . Alcohol use: No   . Drug use: No  . Sexual activity: Not Currently    Partners: Male    Birth control/protection: Surgical  Lifestyle  . Physical activity:    Days per week: Not on file    Minutes per session: Not on file  . Stress: Not on file  Relationships  . Social connections:    Talks on phone: Not on file    Gets together: Not on file    Attends religious service: Not on file    Active member of club or organization: Not on file    Attends meetings of clubs or organizations: Not on file    Relationship status: Not on file  . Intimate partner violence:    Fear of [place] or ex partner: Not on file    Emotionally abused: Not on file    Physically abused: Not on file    Forced sexual activity: Not on file  Other Topics Concern  . Not on file  Social History Narrative  . Not on file     PHYSICAL EXAM  There were no vitals filed for this visit.  There is no height or weight on file to calculate BMI.   General: The patient is well-developed and well-nourished and in no acute distress.    Musculoskeletal:   No wrist tenderness now. \   Neurologic Exam  Mental status: The patient is alert and oriented x 3 at the time of the examination. The patient has apparent normal recent and remote memory, with an apparently normal attention span and concentration ability.   Speech is normal.  Cranial nerves: Extraocular movements are full.  Facial strength and sensation is normal.  Trapezius strength is strong..  Motor:  Muscle bulk is normal and tone is increased in legs. Strength is  5 / 5 in all 4 extremities except for 4+/5 APB strength (bilaterally)  Sensory: She has decreased sensation to touch temperature and vibration in the left leg relative to the right leg. Now has normal hand sensation bilaterally  Coordination: Cerebellar testing shows mildly reduced heel to shin but normal finger to nose  Gait and station: Station is stable.  The gait is mildly  wide.  Tandem gait is wide. .   Romberg sign is negative.   Reflexes: Deep tendon reflexes are symmetric and fairly normal in the arms. However, she has increased reflexes in the legs with spread at the knees and a couple beats of nonsustained clonus in the ankles...       A. Felecia Shelling, MD, PhD 03/08/7354, 7:01 PM Certified in Neurology, Clinical Neurophysiology, Sleep Medicine, Pain Medicine and Neuroimaging  [facility] [address], [place], [place] [address] [phone]

## 2018-08-09 ENCOUNTER — Ambulatory Visit: Payer: PPO | Admitting: Obstetrics and Gynecology

## 2018-08-11 ENCOUNTER — Other Ambulatory Visit: Payer: Self-pay | Admitting: Neurology

## 2018-08-14 NOTE — Telephone Encounter (Signed)
Adderall  [place] Database Verified LR: 06/15/2018  Qty: 60 Pending appointment: No pending appt

## 2018-08-18 ENCOUNTER — Other Ambulatory Visit: Payer: Self-pay | Admitting: Internal Medicine

## 2018-08-18 DIAGNOSIS — Z1231 Encounter for screening mammogram for malignant neoplasm of breast: Secondary | ICD-10-CM

## 2018-08-21 DIAGNOSIS — M810 Age-related osteoporosis without current pathological fracture: Secondary | ICD-10-CM | POA: Diagnosis not present

## 2018-08-31 ENCOUNTER — Other Ambulatory Visit: Payer: Self-pay

## 2018-08-31 MED ORDER — FENTANYL 25 MCG/HR TD PT72: MEDICATED_PATCH | TRANSDERMAL | 0 refills | Status: DC

## 2018-09-05 DIAGNOSIS — M19042 Primary osteoarthritis, left hand: Secondary | ICD-10-CM | POA: Diagnosis not present

## 2018-09-05 DIAGNOSIS — M19041 Primary osteoarthritis, right hand: Secondary | ICD-10-CM | POA: Diagnosis not present

## 2018-09-05 DIAGNOSIS — M1712 Unilateral primary osteoarthritis, left knee: Secondary | ICD-10-CM | POA: Diagnosis not present

## 2018-09-05 DIAGNOSIS — F325 Major depressive disorder, single episode, in full remission: Secondary | ICD-10-CM | POA: Diagnosis not present

## 2018-09-05 DIAGNOSIS — M810 Age-related osteoporosis without current pathological fracture: Secondary | ICD-10-CM | POA: Diagnosis not present

## 2018-09-07 ENCOUNTER — Other Ambulatory Visit: Payer: Self-pay

## 2018-09-07 DIAGNOSIS — F325 Major depressive disorder, single episode, in full remission: Secondary | ICD-10-CM | POA: Diagnosis not present

## 2018-09-07 DIAGNOSIS — M1712 Unilateral primary osteoarthritis, left knee: Secondary | ICD-10-CM | POA: Diagnosis not present

## 2018-09-07 DIAGNOSIS — M810 Age-related osteoporosis without current pathological fracture: Secondary | ICD-10-CM | POA: Diagnosis not present

## 2018-09-07 DIAGNOSIS — M19041 Primary osteoarthritis, right hand: Secondary | ICD-10-CM | POA: Diagnosis not present

## 2018-09-07 DIAGNOSIS — M19042 Primary osteoarthritis, left hand: Secondary | ICD-10-CM | POA: Diagnosis not present

## 2018-09-12 ENCOUNTER — Ambulatory Visit (INDEPENDENT_AMBULATORY_CARE_PROVIDER_SITE_OTHER): Payer: PPO | Admitting: Obstetrics and Gynecology

## 2018-09-12 ENCOUNTER — Telehealth: Payer: Self-pay | Admitting: *Deleted

## 2018-09-12 ENCOUNTER — Other Ambulatory Visit: Payer: Self-pay

## 2018-09-12 ENCOUNTER — Encounter: Payer: Self-pay | Admitting: Obstetrics and Gynecology

## 2018-09-12 VITALS — BP 122/70 | HR 88 | Temp 97.3°F | Resp 12 | Ht 65.25 in | Wt 130.0 lb

## 2018-09-12 DIAGNOSIS — Z01419 Encounter for gynecological examination (general) (routine) without abnormal findings: Secondary | ICD-10-CM

## 2018-09-12 NOTE — Progress Notes (Signed)
63 y.o. G70P1001 Married Caucasian female here for annual exam.    Husband has dementia and patient cares for him.  There is stress at home due to this.  He has personality changes.  Taking Prolia through her PCP.  She received her injection in June, 2020.   PCP: Lavone Orn, MD    Patient's last menstrual period was 07/06/1993.           Sexually active: No.  The [place] method of family planning is status post hysterectomy -- ovaries remain.    Exercising: No.  The patient does not participate in regular exercise at present. Smoker:  no  Health Maintenance: Pap:  TVH 1995 History of abnormal Pap:  no MMG:  09/28/17 BIRADS 1 negative/density c.   Colonoscopy:  12/2013 f/u 5 years. BMD:   09/03/16  Result  Osteoporosis.  PCP managing.  TDaP:  2017 HIV and Hep C: never Screening Labs:  PCP   reports that she quit smoking about 39 years ago. Her smoking use included cigarettes. She has a 5.00 pack-year smoking history. She has never used smokeless tobacco. She reports that she does not drink alcohol or use drugs.  Past Medical History:  Diagnosis Date  . Anxiety   . Bulging lumbar disc 1980  . Chronic pansinusitis   . Colon polyp   . Depression   . GERD (gastroesophageal reflux disease)   . Gynecological examination    sees Dr. Elyse Hsu   . Headache(784.0)   . Multiple sclerosis ([place]) since 1992   Dr. Arlice Colt in [facility]  . Multiple thyroid nodules 1995   per pt. resolved on its own follwed by endo then released  . Neurogenic bladder disorder 12/2009   w/ urge & stress inc. dx. Dr. McDiarmid  . Neuropathy   . Osteoarthritis   . Osteoporosis    last DEXA 2018  . Overactive bladder    sees Dr. Nicki Reaper McDiarmid   . Pancreatitis 1977  . Ulcerative proctitis ([place])   . Vision abnormalities     Past Surgical History:  Procedure Laterality Date  . APPENDECTOMY    . breast ruptured  left   ductal cyst  . CARPAL TUNNEL RELEASE Right   . CHOLECYSTECTOMY     . COLONOSCOPY  01/10/09   repeat in 5 yrs Dr. Wilford Corner  . DILATION AND CURETTAGE OF UTERUS    . LAPAROSCOPY     x 3  . left knee surgery  1978   to remove bone tumor    . NASAL SINUS SURGERY Bilateral 01/13/2015   Procedure: ENDOSCOPIC SINUS SURGERY;  Surgeon: Izora Gala, MD;  Location: [facility];  Service: ENT;  Laterality: Bilateral;  . NASAL SINUS SURGERY  05/2017  . NASAL TURBINATE REDUCTION    . pubo vaginal sling  10/2003  . TONSILLECTOMY    . vaginal cystocele repair     anterior  . VAGINAL HYSTERECTOMY  1995   AUB & Endometriosis  . widom teeth removal      [facility] Medications  Medication Sig Dispense Refill  . acetaminophen (TYLENOL) 500 MG tablet Take 500 mg by mouth 4 (four) times daily.    Marland Kitchen amphetamine-dextroamphetamine (ADDERALL) 20 MG tablet TAKE 1 TABLET TWICE DAILY AS NEEDED. 60 tablet 0  . Calcium Carb-Cholecalciferol (CALCIUM 600 + D PO) Take 1 tablet by mouth daily.    . clonazePAM (KLONOPIN) 1 MG tablet Take 1 tablet (1 mg total) by mouth 3 (three) times  daily as needed. 270 tablet 1  . denosumab (PROLIA) 60 MG/ML SOSY injection Inject 60 mg into the skin every 6 (six) months. 1 each 1  . DULoxetine (CYMBALTA) 60 MG capsule Take 1 capsule (60 mg total) by mouth daily. 90 capsule 3  . fentaNYL (DURAGESIC) 25 MCG/HR Place 1 patch (25 mcg total) onto the skin every 3 (three) days. 10 patch 0  . fesoterodine (TOVIAZ) 4 MG TB24 tablet Take 4 mg by mouth daily.    Marland Kitchen gabapentin (NEURONTIN) 600 MG tablet TAKE 1 TABLET 4 TIMES A DAY 360 tablet 3  . leflunomide (ARAVA) 20 MG tablet ONE WHOLE TABLET DAILY BY MOUTH 90 tablet 3  . methocarbamol (ROBAXIN) 500 MG tablet TAKE 1 TABLET BY MOUTH EVERY 8 HOURS AS NEEDED FOR MUSCLE SPASMS 270 tablet 0  . Multiple Vitamin (MULTIVITAMIN) tablet Take 1 tablet by mouth daily.    Marland Kitchen oxybutynin (DITROPAN-XL) 10 MG 24 hr tablet Take 10 mg by mouth daily.    . traMADol (ULTRAM) 50 MG tablet TAKE 1  TABLET EVERY SIX HOURS AS NEEDED (8 AM, 12 NOON, 4 PM AND 8 PM) 120 tablet 3   No [place] facility-administered medications for this visit.   She is taking Oxybutynin 10 mg daily.  Family History  Problem Relation Age of Onset  . Osteoporosis Mother        severe  . Cancer Mother 7       colon /Breast also m. niece at 10's  . Breast cancer Mother   . Osteopenia Sister        X 2  . Breast cancer Other     Review of Systems  Constitutional: Negative.   HENT: Negative.   Eyes: Negative.   Respiratory: Negative.   Cardiovascular: Negative.   Gastrointestinal: Negative.   Endocrine: Negative.   Genitourinary: Negative.   Musculoskeletal: Negative.   Skin: Negative.   Allergic/Immunologic: Negative.   Neurological: Negative.   Hematological: Negative.   Psychiatric/Behavioral: Negative.     Exam:   BP 122/70 (BP Location: Right Arm, Patient Position: Sitting, Cuff Size: Normal)   Pulse 88   Temp (!) 97.3 F (36.3 C) (Temporal)   Resp 12   Ht 5' 5.25" (1.657 m)   Wt 130 lb (59 kg)   LMP 07/06/1993   BMI 21.47 kg/m     General appearance: alert, cooperative and appears stated age Head: normocephalic, without obvious abnormality, atraumatic Neck: no adenopathy, supple, symmetrical, trachea midline and thyroid normal to inspection and palpation Lungs: clear to auscultation bilaterally Breasts: normal appearance, no masses or tenderness, No nipple retraction or dimpling, No nipple discharge or bleeding, No axillary adenopathy Heart: regular rate and rhythm Abdomen: soft, non-tender; no masses, no organomegaly Extremities: extremities normal, atraumatic, no cyanosis or edema Skin: skin color, texture, turgor normal. No rashes or lesions Lymph nodes: cervical, supraclavicular, and axillary nodes normal. Neurologic: grossly normal  Pelvic: External genitalia:  no lesions              No abnormal inguinal nodes palpated.              Urethra:  normal appearing urethra  with no masses, tenderness or lesions              Bartholins and Skenes: normal                 Vagina: normal appearing vagina with normal color and discharge, no lesions  Cervix: absent              Pap taken: No. Bimanual Exam:  Uterus:  absent              Adnexa: no mass, fullness, tenderness              Rectal exam: Yes.  .  Confirms.              Anus:  normal sphincter tone, no lesions  Chaperone was present for exam.  Assessment:   Well woman visit with normal exam. Status post TVH.  Still has ovaries.  Status post pubovaginal sling.  MS and neurogenic bladder.  On Cymbalta. Chronic pain.   Osteoporosis.  On Prolia.  FH colon and breast cancer.   Caregiver stress.  Plan: Mammogram screening discussed. Self breast awareness reviewed. Pap and HR HPV as above. Guidelines for Calcium, Vitamin D, regular exercise program including cardiovascular and weight bearing exercise. Due for BMD this year through PCP.  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  Follow up annually and prn.   After visit summary provided.

## 2018-09-12 NOTE — Telephone Encounter (Signed)
Gave completed/signed [employer] parking placard form back to medical records to process for pt. 

## 2018-09-12 NOTE — Patient Instructions (Signed)

## 2018-09-19 DIAGNOSIS — N3946 Mixed incontinence: Secondary | ICD-10-CM | POA: Diagnosis not present

## 2018-09-19 DIAGNOSIS — N319 Neuromuscular dysfunction of bladder, unspecified: Secondary | ICD-10-CM | POA: Diagnosis not present

## 2018-09-28 ENCOUNTER — Other Ambulatory Visit: Payer: Self-pay

## 2018-09-28 MED ORDER — FENTANYL 25 MCG/HR TD PT72: MEDICATED_PATCH | TRANSDERMAL | 0 refills | Status: DC

## 2018-10-05 ENCOUNTER — Other Ambulatory Visit: Payer: Self-pay | Admitting: *Deleted

## 2018-10-05 MED ORDER — ARIPIPRAZOLE 2 MG PO TABS: 2.0000 mg | ORAL_TABLET | Freq: Every day | ORAL | 5 refills | Status: DC

## 2018-10-06 ENCOUNTER — Ambulatory Visit
Admission: RE | Admit: 2018-10-06 | Discharge: 2018-10-06 | Disposition: A | Discharge status: Home / Self Care | Payer: PPO | Source: Ambulatory Visit | Attending: Internal Medicine | Admitting: Internal Medicine

## 2018-10-06 ENCOUNTER — Other Ambulatory Visit: Payer: Self-pay

## 2018-10-06 DIAGNOSIS — Z1231 Encounter for screening mammogram for malignant neoplasm of breast: Secondary | ICD-10-CM | POA: Diagnosis not present

## 2018-10-06 IMAGING — MG DIGITAL SCREENING BILATERAL MAMMOGRAM WITH TOMO AND CAD
8 series · 8 of 24 positions shown · non-contrast
Comparison: Previous exam(s).

CLINICAL DATA: Screening.

EXAM:
DIGITAL SCREENING BILATERAL MAMMOGRAM WITH TOMO AND CAD

[R MLO synth-2D]
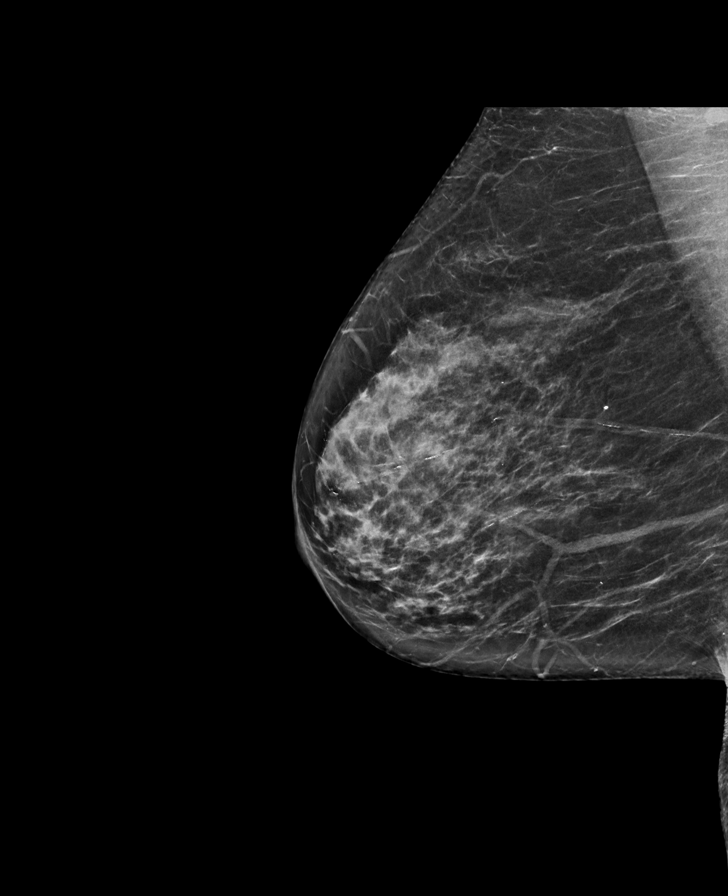

[R CC synth-2D]
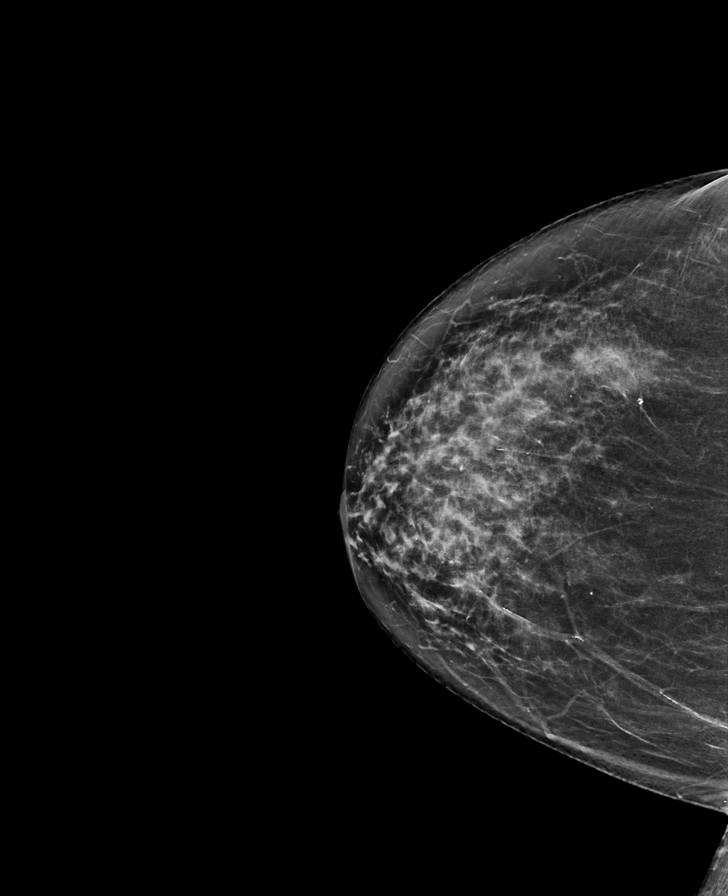

[L CC synth-2D]
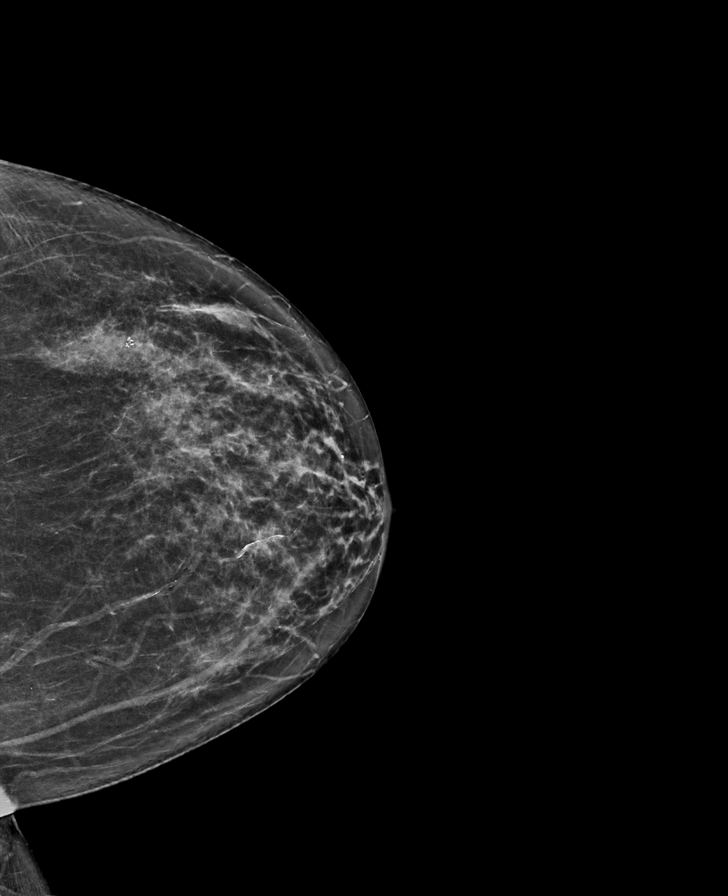

[L MLO synth-2D]
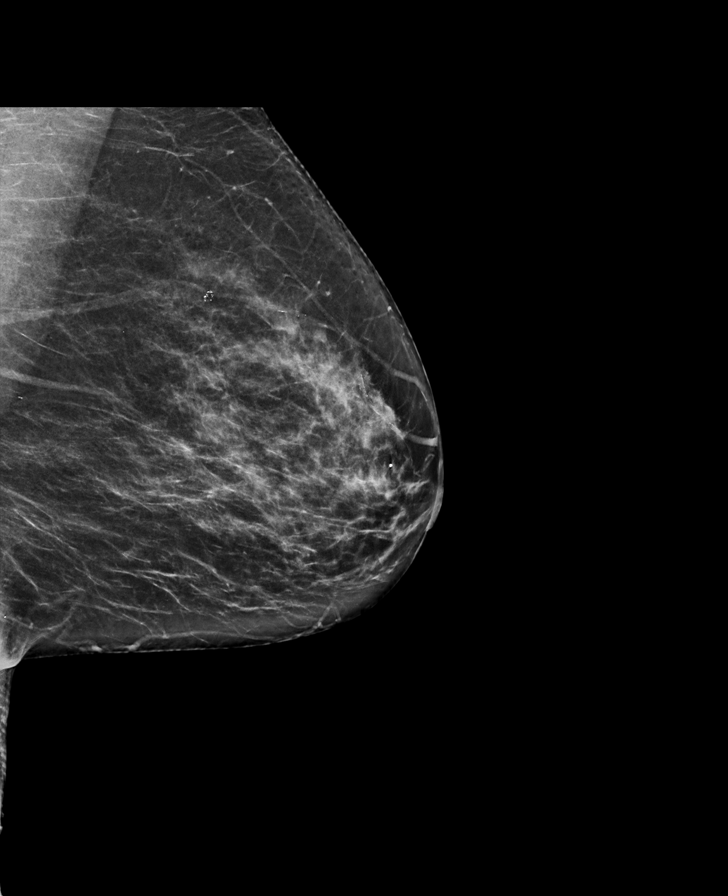

[L CC tomo · tomo slice 33/65.0]
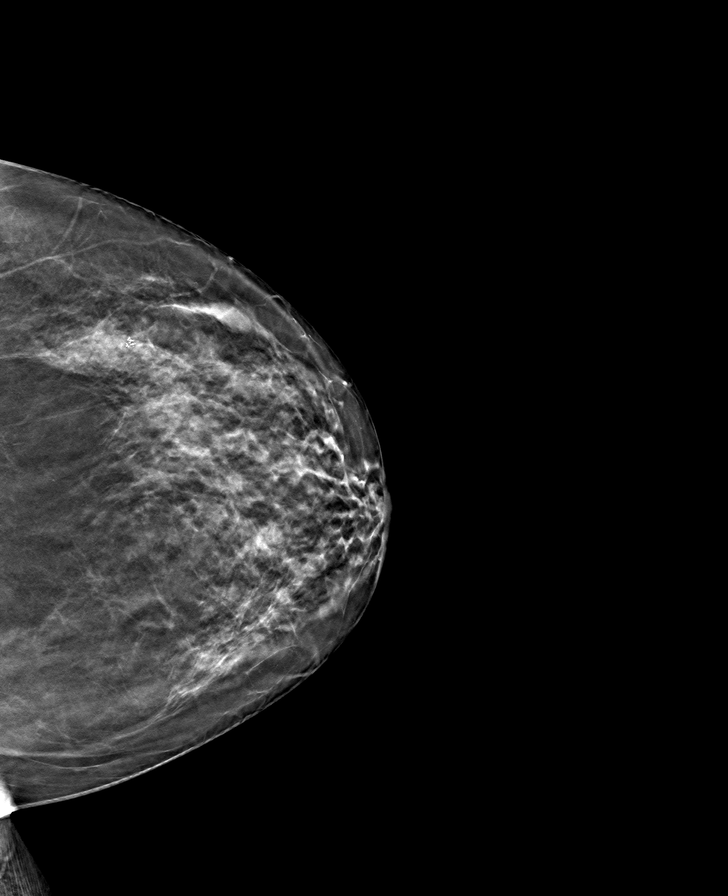

[L MLO tomo · tomo slice 36/71.0]
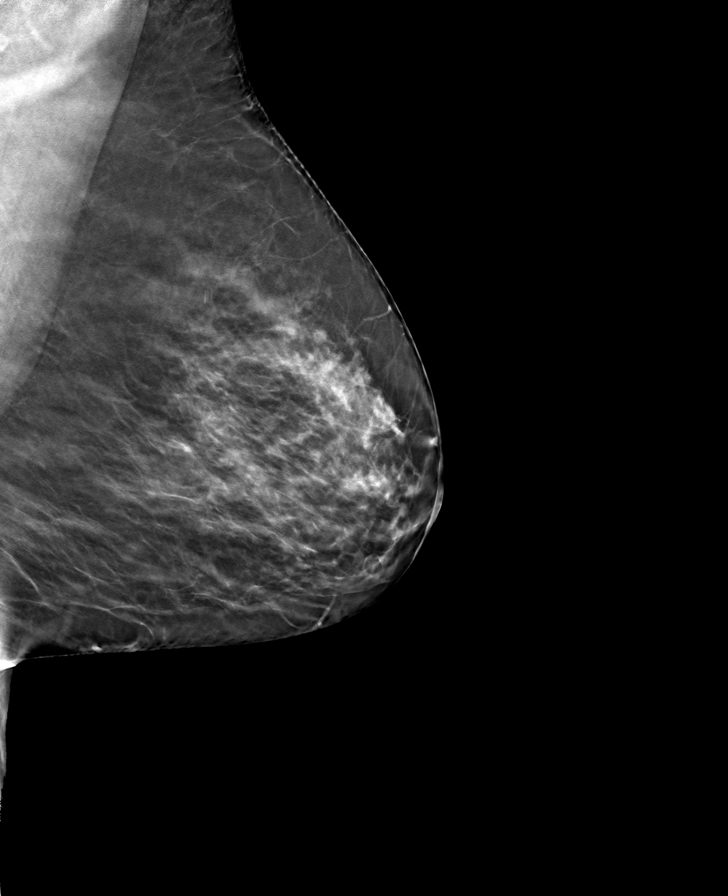

[R CC tomo · tomo slice 35/68.0]
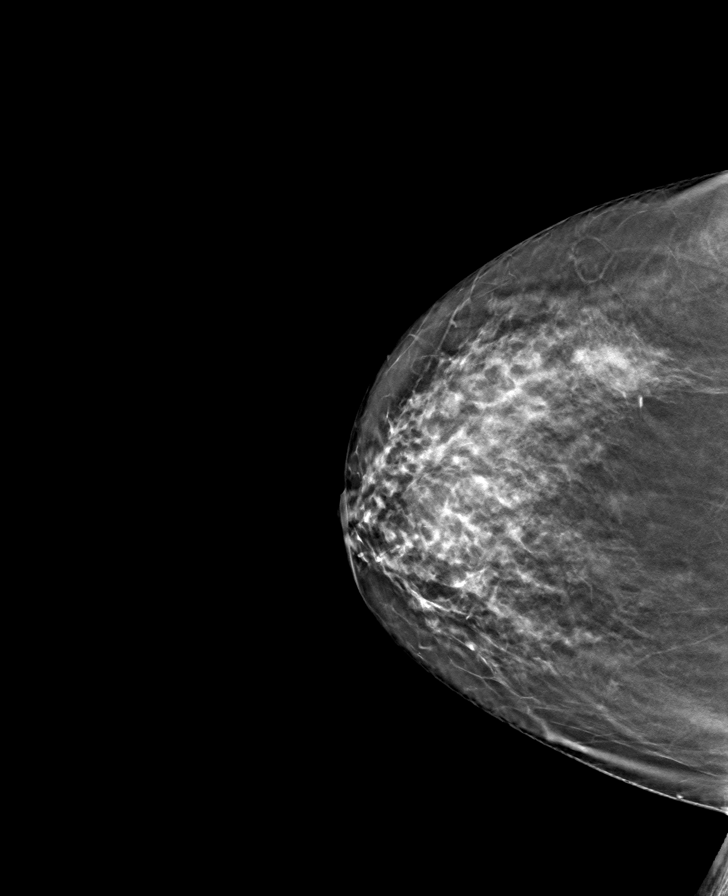

[R MLO tomo · tomo slice 35/70.0]
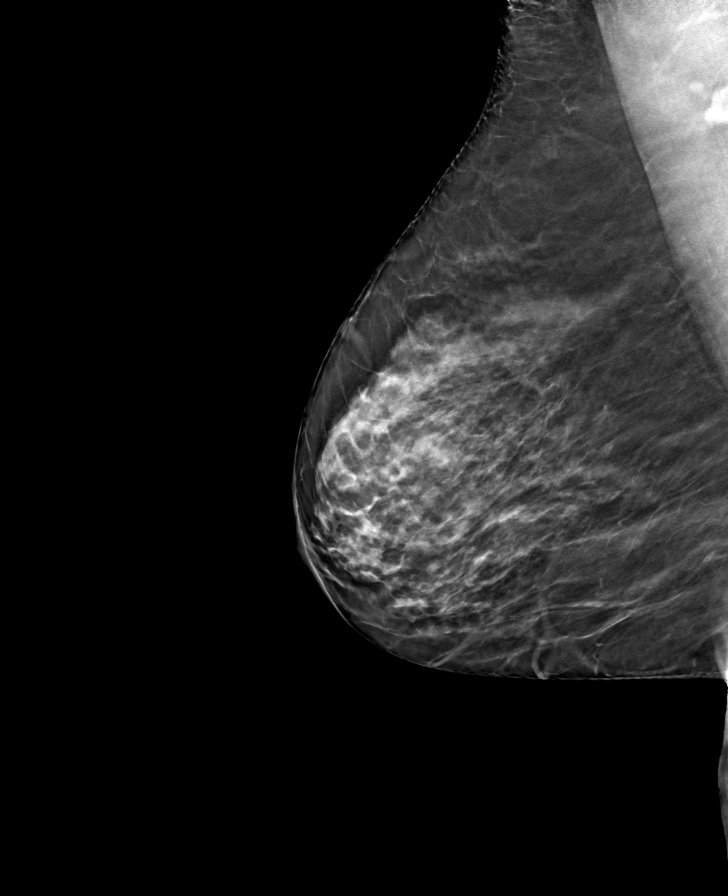

[8 of 24 positions shown; findings below may reference images not displayed]

ACR Breast Density Category c: The breast tissue is heterogeneously
dense, which may obscure small masses.
FINDINGS: There are no findings suspicious for malignancy. Images were
processed with CAD.
IMPRESSION: No mammographic evidence of malignancy. A result letter of this
screening mammogram will be mailed directly to the patient.

RECOMMENDATION:
Screening mammogram in one year. (Code:[5V])

BI-RADS CATEGORY  1: Negative.

## 2018-10-11 ENCOUNTER — Other Ambulatory Visit: Payer: Self-pay | Admitting: Internal Medicine

## 2018-10-11 DIAGNOSIS — M810 Age-related osteoporosis without current pathological fracture: Secondary | ICD-10-CM

## 2018-10-13 ENCOUNTER — Other Ambulatory Visit: Payer: Self-pay | Admitting: Neurology

## 2018-10-31 ENCOUNTER — Other Ambulatory Visit: Payer: Self-pay

## 2018-11-01 MED ORDER — FENTANYL 25 MCG/HR TD PT72: MEDICATED_PATCH | TRANSDERMAL | 0 refills | Status: DC

## 2018-11-01 NOTE — Telephone Encounter (Signed)
[place] Database Verified LR: 10-02-2018 Qty: 30 Pending appointment: 12-14-2018

## 2018-11-02 ENCOUNTER — Other Ambulatory Visit: Payer: Self-pay | Admitting: Neurology

## 2018-11-06 DIAGNOSIS — M19042 Primary osteoarthritis, left hand: Secondary | ICD-10-CM | POA: Diagnosis not present

## 2018-11-06 DIAGNOSIS — M810 Age-related osteoporosis without current pathological fracture: Secondary | ICD-10-CM | POA: Diagnosis not present

## 2018-11-06 DIAGNOSIS — F325 Major depressive disorder, single episode, in full remission: Secondary | ICD-10-CM | POA: Diagnosis not present

## 2018-11-06 DIAGNOSIS — M1712 Unilateral primary osteoarthritis, left knee: Secondary | ICD-10-CM | POA: Diagnosis not present

## 2018-11-06 DIAGNOSIS — M19041 Primary osteoarthritis, right hand: Secondary | ICD-10-CM | POA: Diagnosis not present

## 2018-11-06 DIAGNOSIS — E781 Pure hyperglyceridemia: Secondary | ICD-10-CM | POA: Diagnosis not present

## 2018-11-07 ENCOUNTER — Other Ambulatory Visit: Payer: Self-pay | Admitting: Neurology

## 2018-11-07 DIAGNOSIS — M19042 Primary osteoarthritis, left hand: Secondary | ICD-10-CM | POA: Diagnosis not present

## 2018-11-07 MED ORDER — CLONAZEPAM 1 MG PO TABS: 1.0000 mg | ORAL_TABLET | Freq: Three times a day (TID) | ORAL | 1 refills | Status: DC | PRN

## 2018-11-07 NOTE — Telephone Encounter (Signed)
[email or web address] [place], [place] [address] Has called for a refill for pt for clonazePAM (KLONOPIN) 1 MG tablet

## 2018-11-07 NOTE — Telephone Encounter (Signed)
Checked drug registry. She last refilled 08/17/18 #[id number]. Last seen 08/07/18 and next f/u 12/14/18

## 2018-11-07 NOTE — Addendum Note (Signed)
Addended by: Hope Pigeon on: 11/07/2018 03:49 PM   Modules accepted: Orders

## 2018-11-09 DIAGNOSIS — M25562 Pain in left knee: Secondary | ICD-10-CM | POA: Diagnosis not present

## 2018-11-09 DIAGNOSIS — M79641 Pain in right hand: Secondary | ICD-10-CM | POA: Diagnosis not present

## 2018-11-14 ENCOUNTER — Other Ambulatory Visit: Payer: Self-pay | Admitting: Neurology

## 2018-11-20 DIAGNOSIS — E781 Pure hyperglyceridemia: Secondary | ICD-10-CM | POA: Diagnosis not present

## 2018-11-20 DIAGNOSIS — M810 Age-related osteoporosis without current pathological fracture: Secondary | ICD-10-CM | POA: Diagnosis not present

## 2018-11-20 DIAGNOSIS — Z0000 Encounter for general adult medical examination without abnormal findings: Secondary | ICD-10-CM | POA: Diagnosis not present

## 2018-11-20 DIAGNOSIS — G35 Multiple sclerosis: Secondary | ICD-10-CM | POA: Diagnosis not present

## 2018-11-20 DIAGNOSIS — Z1389 Encounter for screening for other disorder: Secondary | ICD-10-CM | POA: Diagnosis not present

## 2018-11-20 DIAGNOSIS — F325 Major depressive disorder, single episode, in full remission: Secondary | ICD-10-CM | POA: Diagnosis not present

## 2018-11-21 ENCOUNTER — Telehealth: Payer: Self-pay | Admitting: *Deleted

## 2018-11-21 NOTE — Telephone Encounter (Signed)
Called pt and offered 9am this morning with Dr. Felecia Shelling ( he had cx/she was on wait list). She declined but would like to stay on cx list.

## 2018-11-29 ENCOUNTER — Other Ambulatory Visit: Payer: Self-pay

## 2018-11-30 MED ORDER — FENTANYL 25 MCG/HR TD PT72: MEDICATED_PATCH | TRANSDERMAL | 0 refills | Status: DC

## 2018-12-05 DIAGNOSIS — M19042 Primary osteoarthritis, left hand: Secondary | ICD-10-CM | POA: Diagnosis not present

## 2018-12-05 DIAGNOSIS — E781 Pure hyperglyceridemia: Secondary | ICD-10-CM | POA: Diagnosis not present

## 2018-12-05 DIAGNOSIS — M810 Age-related osteoporosis without current pathological fracture: Secondary | ICD-10-CM | POA: Diagnosis not present

## 2018-12-05 DIAGNOSIS — M19041 Primary osteoarthritis, right hand: Secondary | ICD-10-CM | POA: Diagnosis not present

## 2018-12-05 DIAGNOSIS — F325 Major depressive disorder, single episode, in full remission: Secondary | ICD-10-CM | POA: Diagnosis not present

## 2018-12-05 DIAGNOSIS — M1712 Unilateral primary osteoarthritis, left knee: Secondary | ICD-10-CM | POA: Diagnosis not present

## 2018-12-11 ENCOUNTER — Other Ambulatory Visit: Payer: Self-pay | Admitting: Neurology

## 2018-12-11 NOTE — Telephone Encounter (Signed)
Last order on adderall was on 11/14/2018 but this does not show up in [facility] Drug Registry.  Pt has an appt this week with Dr. Felecia Shelling. Will send to him for further review.

## 2018-12-12 ENCOUNTER — Telehealth: Payer: Self-pay | Admitting: *Deleted

## 2018-12-12 NOTE — Telephone Encounter (Signed)
Called pt and offered appt 12/12/18 at 8:30am with Dr. Felecia Shelling. He had cx. She declined, could not find someone that quick to be with her husband. She would like to stay on wait list.

## 2018-12-14 ENCOUNTER — Encounter

## 2018-12-14 ENCOUNTER — Encounter: Payer: Self-pay | Admitting: Neurology

## 2018-12-14 ENCOUNTER — Other Ambulatory Visit: Payer: Self-pay

## 2018-12-14 ENCOUNTER — Ambulatory Visit (INDEPENDENT_AMBULATORY_CARE_PROVIDER_SITE_OTHER): Payer: PPO | Admitting: Neurology

## 2018-12-14 VITALS — BP 151/83 | HR 84 | Temp 97.3°F | Ht 65.0 in | Wt 124.0 lb

## 2018-12-14 DIAGNOSIS — Z79891 Long term (current) use of opiate analgesic: Secondary | ICD-10-CM | POA: Insufficient documentation

## 2018-12-14 DIAGNOSIS — G35 Multiple sclerosis: Secondary | ICD-10-CM | POA: Diagnosis not present

## 2018-12-14 DIAGNOSIS — R208 Other disturbances of skin sensation: Secondary | ICD-10-CM

## 2018-12-14 DIAGNOSIS — M5432 Sciatica, left side: Secondary | ICD-10-CM | POA: Diagnosis not present

## 2018-12-14 DIAGNOSIS — F418 Other specified anxiety disorders: Secondary | ICD-10-CM | POA: Diagnosis not present

## 2018-12-14 DIAGNOSIS — R260 Ataxic gait: Secondary | ICD-10-CM | POA: Diagnosis not present

## 2018-12-14 MED ORDER — MELOXICAM 7.5 MG PO TABS: 7.5000 mg | ORAL_TABLET | Freq: Every day | ORAL | 5 refills | Status: DC

## 2018-12-14 NOTE — Progress Notes (Signed)
[facility]  PATIENT: Sheri Burns DOB: February 28, 1956  REFERRING CLINICIAN: Thressa Sheller  HISTORY FROM: Patient REASON FOR VISIT: MS   HISTORICAL  CHIEF COMPLAINT:  Chief Complaint  Patient presents with  . Follow-up    pt alone, rm 12. pt complains of some back pain she is wanting to discuss    HISTORY OF PRESENT ILLNESS:  Sheri Burns is a 63 year old woman with MS.     Update 12/14/2018: She is leflunomide and tolerates it well.  Before that she was on Aubagio but there were issues with co-pays.  She denies any exacerbations though she feels more dizzy at times..    Balance is fine and she has no falls..   Her husnband has been sick adding mroestress.  Leg strength is about the same as last visit with the left leg being a little weaker than the right.  She gets dysesthesias in the legs.  She notes some fatigue.  Her MS-related attention deficit is improved with Adderall.  She is noting more lower back pain.   Gabapentin has not helped much.  Cymbalta has not helped the pain.    She is on fentanyl patches.   She sometimes uses Solonpas numbing patches.   She is on methocarbamol and clonazepam which should help for spasms.      Update 08/07/2018 (video) She feels her MS has been stable.  She denies any definite exacerbation though she has one day of vertigo.   She is on leflunomide (had high copay on Aubagio). Walking is the same as last visit (a little off balanced).   Leg strength is the same.   The left leg is a little weak and sometimes gives way but no falls.     She has pain, helped by Fentanyl, Gabapentin and duloxetine.   Pain is in her back but also in the left leg with a more dysesthetic quality.  She notes more fatigue the last couple months.   She notes having to take care of her 64 yo granddaughter since Sheri Burns.  Her MS related ADD is better with Adderall.  She needs a refill   Bladder is overactive but stable.   Vision is fine.     Her husband  had pneumonia in March so they are socially distancing with visits from daughter/grandkids only.  She notes mild depression.  She is on Cymbalta.  She just had her CTS surgery by Dr. Caralyn Guile and she felt better quickly.   Pain and numbness resolved completely.   She is taking Vit D   Update 01/26/2018: She feels her MS has been stable.   She is on leflunomide (had high copay on Aubagio). Walking is the same as last visit (a little off balanced).   Leg strength is the same.   The left leg is a little weak and sometimes gives way.       She has continued pain, helped by Fentanyl.     She has fatigue and MS related ADD helped by Adderall.     She just had her CTS surgery last month and she felt better the next day.   Pain and numbness resolved completely.   She saw Dr. Caralyn Guile  Impression (10/18/2017): This NCV/EMG study shows the following: 1.    Moderately severe right median neuropathy at the wrist (carpal tunnel syndrome).   Chronic denervation changes were noted in the APB muscle. 2.    Borderline left median neuropathy at the wrist. 3.  There is no evidence of a significant cervical radiculopathy on the right.   Update 09/13/2017: She is having more left buttock/hip pain.  Pain will sometimes radiate to the toes but more recently is radiating to the front of the thigh.   Pain is worse if she leans over or bends to the side while standing.    Years ago, she was receiving ESI's for LB and right leg pain.      She is on fentanyl 25 mg patches and tolerates them well.   No drug seeking behavior and NCCSRS has been reviewed in the past.     Her MS is stable.   She is on Sheri Burns and tolerates it well.   She has no recent exacerbations.     She has left leg mild weakness and dysesthesia.  She is noting numbness in both hands..    Her vision is fine.    She has fatigue.   Sleep is poor due to sleep maintenance insomnia.     She takes clonazepam at night.  Update 06/22/2017: She feels her MS is stable.    She is on leflunomide and tolerates it well.   She has no recent exacerbations.   She notes mild reduced balance but gait is stable.   No falls.    Strength is slightly weak in the left leg and she has left > right leg dysesthesias.    She also has sciatic type pain on the left.   She is on fentanyl 25 mcg and clonazepam and tramadol qid  [name] is helping her bladder and she tolerates it well.  However, when she was coughing a lot associated with a mild pneumonia she did have some leakage   vision is doing ok.    She has sleep maintenance insomnia waking up several times every night.   She has some daytime fatigue.    She notes some depression and has stess with husband's illness.    Update 02/07/2017:    Her MS is stable.   She tolerates the Leflunomide well.   She has not had any exacerbation on it.      She feels her gait is stable. At times her legs are tired and balance can be slightly off but she has not had any falls. There will be some numbness and tingling in the left leg  She is still having left leg pain.   It radiates from the buttock into the left leg to the foot (top > bottom).   She denies weakness.  No bladder changes.    Years ago, she had a couple ESI's with benefit.   She got 2 month benefit from a piriformis muscle injection at the last visit.  Fentanyl helps the pain.   Mood is stable. She notes fatigue and attentional deficits, both help with Adderall.   From 08/12/2016: Pain:   She reports new pain in the left buttock that radiates down the leg. She also has numbness in the left anterolateral thigh and some pain in the lateral groin region.   In the past, she had similar pain in the right buttock that improved after trigger point injection.  MS:   Because her Aubagio co-pay was very high, I placed her on leflunomide. She is tolerating it well and has had no MS exacerbation.  Her last MRI of the brain was some are 2016 and we discussed getting another one to determine if she is  having any subclinical activity that would make  [place] consider a different medication.  Gait/strength/sensation: She reports that her gait is stable. She still notes mild weakness in her legs and mild problems with her balance. She notes numbness and sometimes painful tingling in the left leg.  Bladder:   She denies any change in her bladder.   Fatigue: She continues to report difficulties with physical and cognitive fatigue. This is improved with Adderall  MS History:  She was diagnosed with multiple sclerosis more than 20 years ago. Initially, she was treated with Betaseron then Copaxone but has not been on any disease modifying therapy for about 8 years. During this time, she has had occasional  Exacerbations with more fatigue and gait issues.  She also has noted mild cognitive dysfunction worsening over the past 5 or 6 years. Her MRI of the brain performed 08/31/2012 shows foci that are predominantly periventricular. They are consistent with the diagnosis of multiple sclerosis. When this MRI was compared to an MRI dated 10/12/2010, there was one additional small periventricular focus. There were no acute findings on either MRI.   In November 2015, she had an exacerbation with severe fatigue, worsened gait and dysphagia.    She started Aubagio 09/09/14.        REVIEW OF SYSTEMS:  Constitutional: No fevers, chills, sweats, or change in appetite.  She notes a lot of fatigue. She is sleeping well. Eyes: No visual changes, double vision, eye pain Ear, nose and throat: No hearing loss, ear pain, nasal congestion, sore throat Cardiovascular: No chest pain, palpitations Respiratory:  No shortness of breath at rest or with exertion.   No wheezes GastrointestinaI: No nausea, vomiting, diarrhea.  Has constipation Genitourinary:  see above. Musculoskeletal:  reports pain in knees.   Reports neck pain and back pain Integumentary: No rash, pruritus, skin lesions Neurological: as above Psychiatric: Depression  and  Anxiety noted Endocrine: No palpitations, diaphoresis, change in appetite, change in weigh or increased thirst Hematologic/Lymphatic:  No anemia, purpura, petechiae. Allergic/Immunologic: No itchy/runny eyes, nasal congestion, recent allergic reactions, rashes  ALLERGIES: Allergies  Allergen Reactions  . Amoxicillin Hives and Itching  . Codeine Nausea And Vomiting    vomiting  . Demerol [Meperidine] Nausea And Vomiting  . Ivp Dye [Iodinated Diagnostic Agents]   . Penicillins   . Stadol [Butorphanol] Other (See Comments)    Per patient psychotic event  . Sulfonamide Derivatives Hives  . Clindamycin/Lincomycin Rash  . Gadolinium Derivatives Hives    HOME MEDICATIONS: Outpatient Medications Prior to Visit  Medication Sig Dispense Refill  . acetaminophen (TYLENOL) 500 MG tablet Take 500 mg by mouth 4 (four) times daily.    Marland Kitchen amphetamine-dextroamphetamine (ADDERALL) 20 MG tablet TAKE 1 TABLET TWICE DAILY AS NEEDED. 60 tablet 0  . ARIPiprazole (ABILIFY) 2 MG tablet Take 1 tablet (2 mg total) by mouth daily. 30 tablet 5  . Calcium Carb-Cholecalciferol (CALCIUM 600 + D PO) Take 1 tablet by mouth daily.    . clonazePAM (KLONOPIN) 1 MG tablet Take 1 tablet (1 mg total) by mouth 3 (three) times daily as needed. 270 tablet 1  . denosumab (PROLIA) 60 MG/ML SOSY injection Inject 60 mg into the skin every 6 (six) months. 1 each 1  . DULoxetine (CYMBALTA) 60 MG capsule Take 1 capsule (60 mg total) by mouth daily. 90 capsule 3  . fentaNYL (DURAGESIC) 25 MCG/HR Place 1 patch (25 mcg total) onto the skin every 3 (three) days. 10 patch 0  . gabapentin (NEURONTIN) 600 MG tablet TAKE 1 TABLET 4  TIMES A DAY 360 tablet 3  . leflunomide (ARAVA) 20 MG tablet ONE WHOLE TABLET DAILY BY MOUTH 90 tablet 3  . methocarbamol (ROBAXIN) 500 MG tablet TAKE 1 TABLET BY MOUTH EVERY 8 HOURS AS NEEDED FOR MUSCLE SPASMS 270 tablet 0  . Multiple Vitamin (MULTIVITAMIN) tablet Take 1 tablet by mouth daily.    Marland Kitchen  oxybutynin (DITROPAN-XL) 10 MG 24 hr tablet Take 10 mg by mouth daily.    . traMADol (ULTRAM) 50 MG tablet TAKE 1 TABLET EVERY SIX HOURS AS NEEDED (8 AM, 12 NOON, 4 PM AND 8 PM) 120 tablet 3  . fesoterodine (TOVIAZ) 4 MG TB24 tablet Take 4 mg by mouth daily.     No facility-administered medications prior to visit.     PAST MEDICAL HISTORY: Past Medical History:  Diagnosis Date  . Anxiety   . Bulging lumbar disc 1980  . Chronic pansinusitis   . Colon polyp   . Depression   . GERD (gastroesophageal reflux disease)   . Gynecological examination    sees Dr. Elyse Hsu   . Headache(784.0)   . Multiple sclerosis ([place]) since 1992   Dr. Arlice Colt in [place] [facility]  . Multiple thyroid nodules 1995   per pt. resolved on its own follwed by endo then released  . Neurogenic bladder disorder 12/2009   w/ urge & stress inc. dx. Dr. McDiarmid  . Neuropathy   . Osteoarthritis   . Osteoporosis    last DEXA 2018  . Overactive bladder    sees Dr. Nicki Reaper McDiarmid   . Pancreatitis 1977  . Ulcerative proctitis ([place])   . Vision abnormalities     PAST SURGICAL HISTORY: Past Surgical History:  Procedure Laterality Date  . APPENDECTOMY    . breast ruptured  left   ductal cyst  . CARPAL TUNNEL RELEASE Right   . CHOLECYSTECTOMY    . COLONOSCOPY  01/10/09   repeat in 5 yrs Dr. Wilford Corner  . DILATION AND CURETTAGE OF UTERUS    . LAPAROSCOPY     x 3  . left knee surgery  1978   to remove bone tumor    . NASAL SINUS SURGERY Bilateral 01/13/2015   Procedure: ENDOSCOPIC SINUS SURGERY;  Surgeon: Izora Gala, MD;  Location: [facility];  Service: ENT;  Laterality: Bilateral;  . NASAL SINUS SURGERY  05/2017  . NASAL TURBINATE REDUCTION    . pubo vaginal sling  10/2003  . TONSILLECTOMY    . vaginal cystocele repair     anterior  . VAGINAL HYSTERECTOMY  1995   AUB & Endometriosis  . widom teeth removal      FAMILY HISTORY: Family History  Problem Relation Age of Onset   . Osteoporosis Mother        severe  . Cancer Mother 21       colon /Breast also m. niece at 57's  . Breast cancer Mother   . Osteopenia Sister        X 2  . Breast cancer Other     SOCIAL HISTORY:  Social History   Socioeconomic History  . Marital status: Married    Spouse name: Jori Moll  . Number of children: 1  . Years of education: 2  . Highest education level: Not on file  Occupational History  . Occupation: Nurse, learning disability: UNEMPLOYED  . Occupation: [employer] Engineer, mining Needs  . Financial resource strain: Not on file  . Food insecurity  Worry: Not on file    Inability: Not on file  . Transportation needs    Medical: Not on file    Non-medical: Not on file  Tobacco Use  . Smoking status: Former Smoker    Packs/day: 1.00    Years: 5.00    Pack years: 5.00    Types: Cigarettes    Quit date: 03/09/1979    Years since quitting: 39.7  . Smokeless tobacco: Never Used  Substance and Sexual Activity  . Alcohol use: No  . Drug use: No  . Sexual activity: Not Currently    Partners: Male    Birth control/protection: Surgical  Lifestyle  . Physical activity    Days per week: Not on file    Minutes per session: Not on file  . Stress: Not on file  Relationships  . Social Herbalist on phone: Not on file    Gets together: Not on file    Attends religious service: Not on file    Active member of club or organization: Not on file    Attends meetings of clubs or organizations: Not on file    Relationship status: Not on file  . Intimate partner violence    Fear of [place] or ex partner: Not on file    Emotionally abused: Not on file    Physically abused: Not on file    Forced sexual activity: Not on file  Other Topics Concern  . Not on file  Social History Narrative  . Not on file     PHYSICAL EXAM  Vitals:   12/14/18 1516  BP: (!) 151/83  Pulse: 84  Temp: (!) 97.3 F (36.3 C)  Weight: 124 lb (56.2 kg)  Height: 5\' 5"  (1.651 m)     Body mass index is 20.63 kg/m.   General: The patient is well-developed and well-nourished and in no acute distress.    Musculoskeletal:   No wrist tenderness now. \   Neurologic Exam  Mental status: The patient is alert and oriented x 3 at the time of the examination. The patient has apparent normal recent and remote memory, with an apparently normal attention span and concentration ability.   Speech is normal.  Cranial nerves: Extraocular movements are full.  Facial strength and sensation is normal.  Trapezius strength is strong..  Motor:  Muscle bulk is normal and tone is increased in legs. Strength is  5 / 5 in all 4 extremities except for 4+/5 APB strength (bilaterally)  Sensory: She has decreased sensation to touch temperature and vibration in the left leg relative to the right leg. Now has normal hand sensation bilaterally  Coordination: Cerebellar testing shows mildly reduced heel to shin but normal finger to nose  Gait and station: Station is stable.  The gait is mildly wide.  Tandem gait is wide. .  Romberg sign is negative.   Reflexes: Deep tendon reflexes are symmetric and fairly normal in the arms. However, she has increased reflexes in the legs with spread at the knees and a couple beats of nonsustained clonus in the ankles...    ______________________________   MULTIPLE SCLEROSIS  Chronic prescription opiate use - Plan: Drug Screen 13 w/Conf, WB  Ataxic gait  Dysesthesia  Depression with anxiety  Sciatica of left side   1.    Continue Aubagio. 2.    Stay active and exercise as tolerated.  We discussed physical therapy for her back.  However, due to her husband's illness she will  hold off at this time. 3.    Meloxicam pain. 4.    She is on fentanyl patches and a benzodiazepine.  We will check a urine tox screen.  The [place] controlled substance database has been reviewed.  She has been compliant and does not get medication from other doctors 5.     Return to see [place] in 4 months or sooner for new or worsening neurologic symptoms   Bradee Common A. Felecia Shelling, MD, PhD [id number], [id number] PM Certified in Neurology, Clinical Neurophysiology, Sleep Medicine, Pain Medicine and Neuroimaging  [facility] [address], [place], [place] [address] [phone]

## 2018-12-18 ENCOUNTER — Ambulatory Visit
Admission: RE | Admit: 2018-12-18 | Discharge: 2018-12-18 | Disposition: A | Discharge status: Home / Self Care | Payer: PPO | Source: Ambulatory Visit | Attending: Internal Medicine | Admitting: Internal Medicine

## 2018-12-18 ENCOUNTER — Other Ambulatory Visit: Payer: Self-pay

## 2018-12-18 DIAGNOSIS — M810 Age-related osteoporosis without current pathological fracture: Secondary | ICD-10-CM

## 2018-12-18 DIAGNOSIS — M85832 Other specified disorders of bone density and structure, left forearm: Secondary | ICD-10-CM | POA: Diagnosis not present

## 2018-12-18 DIAGNOSIS — Z780 Asymptomatic menopausal state: Secondary | ICD-10-CM | POA: Diagnosis not present

## 2018-12-26 DIAGNOSIS — F325 Major depressive disorder, single episode, in full remission: Secondary | ICD-10-CM | POA: Diagnosis not present

## 2018-12-26 DIAGNOSIS — M1712 Unilateral primary osteoarthritis, left knee: Secondary | ICD-10-CM | POA: Diagnosis not present

## 2018-12-26 DIAGNOSIS — M19042 Primary osteoarthritis, left hand: Secondary | ICD-10-CM | POA: Diagnosis not present

## 2018-12-26 DIAGNOSIS — M810 Age-related osteoporosis without current pathological fracture: Secondary | ICD-10-CM | POA: Diagnosis not present

## 2018-12-26 DIAGNOSIS — M19041 Primary osteoarthritis, right hand: Secondary | ICD-10-CM | POA: Diagnosis not present

## 2018-12-26 DIAGNOSIS — E781 Pure hyperglyceridemia: Secondary | ICD-10-CM | POA: Diagnosis not present

## 2018-12-27 LAB — DRUG SCREEN 13 W/CONF, WB
Amphetamines, IA: POSITIVE ng/mL — AB
Barbiturates, IA: NEGATIVE ug/mL
Benzodiazepines, IA: NEGATIVE ng/mL
Cocaine/Metabolite, IA: NEGATIVE ng/mL
Fentanyl, IA: NEGATIVE ng/mL
Meperidine, IA: NEGATIVE ng/mL
Methadone, IA: NEGATIVE ng/mL
Opiates, IA: NEGATIVE ng/mL
Oxycodones, IA: NEGATIVE ng/mL
Phencyclidine, IA: NEGATIVE ng/mL
Propoxyphene, IA: NEGATIVE ng/mL
THC (Marijuana) Mtb, IA: NEGATIVE ng/mL
Tramadol, IA: POSITIVE ng/mL — AB

## 2018-12-27 LAB — BARBITURATES,MS,WB/SP RFX
Amobarbital: NEGATIVE ug/mL
Barbiturates Confirmation: NEGATIVE
Butabarbital: NEGATIVE ug/mL
Butalbital: NEGATIVE ug/mL
Pentobarbital: NEGATIVE ug/mL
Phenobarbital: NEGATIVE ug/mL
Secobarbital: NEGATIVE ug/mL

## 2018-12-27 LAB — AMPHETAMINES/MDMA,MS,WB/SP RFX
Amphetamine: 125 ng/mL
Amphetamines Confirmation: POSITIVE
MDA: NEGATIVE ng/mL
MDEA: NEGATIVE ng/mL
MDMA: NEGATIVE ng/mL
Methamphetamine: NEGATIVE ng/mL

## 2018-12-27 LAB — TRAMADOL,MS,WB/SP RFX
O-Desmethyltramadol: 25.3 ng/mL
Tramadol Confirmation: POSITIVE
Tramadol: 307.7 ng/mL

## 2018-12-31 ENCOUNTER — Other Ambulatory Visit: Payer: Self-pay

## 2019-01-01 MED ORDER — FENTANYL 25 MCG/HR TD PT72: MEDICATED_PATCH | TRANSDERMAL | 0 refills | Status: DC

## 2019-01-01 NOTE — Telephone Encounter (Signed)
[place] Database Verified LR: 12-04-2018 Qty: 10 Pending appointment: 04-23-2019

## 2019-01-12 DIAGNOSIS — J329 Chronic sinusitis, unspecified: Secondary | ICD-10-CM | POA: Diagnosis not present

## 2019-01-12 DIAGNOSIS — J324 Chronic pansinusitis: Secondary | ICD-10-CM | POA: Diagnosis not present

## 2019-01-15 ENCOUNTER — Other Ambulatory Visit: Payer: Self-pay

## 2019-01-15 ENCOUNTER — Other Ambulatory Visit: Payer: Self-pay | Admitting: Neurology

## 2019-01-23 ENCOUNTER — Telehealth: Payer: Self-pay | Admitting: Neurology

## 2019-01-23 DIAGNOSIS — M810 Age-related osteoporosis without current pathological fracture: Secondary | ICD-10-CM | POA: Diagnosis not present

## 2019-01-23 DIAGNOSIS — F325 Major depressive disorder, single episode, in full remission: Secondary | ICD-10-CM | POA: Diagnosis not present

## 2019-01-23 DIAGNOSIS — M19041 Primary osteoarthritis, right hand: Secondary | ICD-10-CM | POA: Diagnosis not present

## 2019-01-23 DIAGNOSIS — M19042 Primary osteoarthritis, left hand: Secondary | ICD-10-CM | POA: Diagnosis not present

## 2019-01-23 DIAGNOSIS — M1712 Unilateral primary osteoarthritis, left knee: Secondary | ICD-10-CM | POA: Diagnosis not present

## 2019-01-23 DIAGNOSIS — E781 Pure hyperglyceridemia: Secondary | ICD-10-CM | POA: Diagnosis not present

## 2019-01-23 MED ORDER — ARIPIPRAZOLE 2 MG PO TABS: 2.0000 mg | ORAL_TABLET | Freq: Every day | ORAL | 5 refills | Status: DC

## 2019-01-23 MED ORDER — DULOXETINE HCL 60 MG PO CPEP: 60.0000 mg | ORAL_CAPSULE | Freq: Every day | ORAL | 3 refills | Status: DC

## 2019-01-23 NOTE — Telephone Encounter (Signed)
Refills for both medications have been sent.

## 2019-01-23 NOTE — Addendum Note (Signed)
Addended by: Verlin Grills T on: 01/23/2019 01:53 PM   Modules accepted: Orders

## 2019-01-23 NOTE — Telephone Encounter (Signed)
Maria from [facility] called and LVM stating that they are needing 2 prescriptions sent over for the pt. She did not mention which medications.  Phone#[phone] Fax#[phone]

## 2019-01-23 NOTE — Telephone Encounter (Signed)
[facility] has called RN Jinny Blossom back,she is asking for a refill on the DULoxetine (CYMBALTA) 60 MG capsule and the ARIPiprazole (ABILIFY) 2 MG tablet for pt. Their [phone] Fax [phone]

## 2019-01-23 NOTE — Telephone Encounter (Signed)
I reached out to [facility] and lvm asking for call back from [facility] to discuss the refills needed.

## 2019-01-26 DIAGNOSIS — J324 Chronic pansinusitis: Secondary | ICD-10-CM | POA: Diagnosis not present

## 2019-01-30 ENCOUNTER — Other Ambulatory Visit: Payer: Self-pay

## 2019-01-30 MED ORDER — FENTANYL 25 MCG/HR TD PT72: MEDICATED_PATCH | TRANSDERMAL | 0 refills | Status: DC

## 2019-02-12 ENCOUNTER — Other Ambulatory Visit: Payer: Self-pay | Admitting: Neurology

## 2019-02-14 DIAGNOSIS — J338 Other polyp of sinus: Secondary | ICD-10-CM | POA: Diagnosis not present

## 2019-02-14 DIAGNOSIS — J3081 Allergic rhinitis due to animal (cat) (dog) hair and dander: Secondary | ICD-10-CM | POA: Diagnosis not present

## 2019-02-14 DIAGNOSIS — J3089 Other allergic rhinitis: Secondary | ICD-10-CM | POA: Diagnosis not present

## 2019-02-14 DIAGNOSIS — J301 Allergic rhinitis due to pollen: Secondary | ICD-10-CM | POA: Diagnosis not present

## 2019-02-26 ENCOUNTER — Other Ambulatory Visit: Payer: Self-pay

## 2019-02-26 MED ORDER — FENTANYL 25 MCG/HR TD PT72: MEDICATED_PATCH | TRANSDERMAL | 0 refills | Status: DC

## 2019-02-27 ENCOUNTER — Telehealth: Payer: Self-pay | Admitting: Neurology

## 2019-02-27 DIAGNOSIS — M19042 Primary osteoarthritis, left hand: Secondary | ICD-10-CM | POA: Diagnosis not present

## 2019-02-27 DIAGNOSIS — M19041 Primary osteoarthritis, right hand: Secondary | ICD-10-CM | POA: Diagnosis not present

## 2019-02-27 DIAGNOSIS — E781 Pure hyperglyceridemia: Secondary | ICD-10-CM | POA: Diagnosis not present

## 2019-02-27 DIAGNOSIS — M1712 Unilateral primary osteoarthritis, left knee: Secondary | ICD-10-CM | POA: Diagnosis not present

## 2019-02-27 DIAGNOSIS — F325 Major depressive disorder, single episode, in full remission: Secondary | ICD-10-CM | POA: Diagnosis not present

## 2019-02-27 DIAGNOSIS — M810 Age-related osteoporosis without current pathological fracture: Secondary | ICD-10-CM | POA: Diagnosis not present

## 2019-02-27 MED ORDER — METHOCARBAMOL 500 MG PO TABS: ORAL_TABLET | ORAL | 1 refills | Status: DC

## 2019-02-27 NOTE — Telephone Encounter (Signed)
Pt is needing a refill on her methocarbamol (ROBAXIN) 500 MG tablet sent in to the [facility]

## 2019-02-27 NOTE — Telephone Encounter (Signed)
E-scribed refill as requested. 

## 2019-03-05 DIAGNOSIS — R0781 Pleurodynia: Secondary | ICD-10-CM | POA: Diagnosis not present

## 2019-03-05 DIAGNOSIS — M545 Low back pain: Secondary | ICD-10-CM | POA: Diagnosis not present

## 2019-03-05 DIAGNOSIS — M5416 Radiculopathy, lumbar region: Secondary | ICD-10-CM | POA: Diagnosis not present

## 2019-03-05 DIAGNOSIS — M25552 Pain in left hip: Secondary | ICD-10-CM | POA: Diagnosis not present

## 2019-03-08 DIAGNOSIS — M810 Age-related osteoporosis without current pathological fracture: Secondary | ICD-10-CM | POA: Diagnosis not present

## 2019-03-12 ENCOUNTER — Other Ambulatory Visit: Payer: Self-pay | Admitting: *Deleted

## 2019-03-12 MED ORDER — AMPHETAMINE-DEXTROAMPHETAMINE 20 MG PO TABS: 20.0000 mg | ORAL_TABLET | Freq: Two times a day (BID) | ORAL | 0 refills | Status: DC | PRN

## 2019-03-14 DIAGNOSIS — M545 Low back pain: Secondary | ICD-10-CM | POA: Diagnosis not present

## 2019-03-19 DIAGNOSIS — M5416 Radiculopathy, lumbar region: Secondary | ICD-10-CM | POA: Diagnosis not present

## 2019-03-19 DIAGNOSIS — M545 Low back pain: Secondary | ICD-10-CM | POA: Diagnosis not present

## 2019-03-19 DIAGNOSIS — M4180 Other forms of scoliosis, site unspecified: Secondary | ICD-10-CM | POA: Diagnosis not present

## 2019-03-27 ENCOUNTER — Other Ambulatory Visit: Payer: Self-pay | Admitting: *Deleted

## 2019-03-27 MED ORDER — TRAMADOL HCL 50 MG PO TABS: ORAL_TABLET | ORAL | 3 refills | Status: DC

## 2019-03-29 DIAGNOSIS — M5416 Radiculopathy, lumbar region: Secondary | ICD-10-CM | POA: Diagnosis not present

## 2019-04-01 ENCOUNTER — Other Ambulatory Visit: Payer: Self-pay | Admitting: Neurology

## 2019-04-02 MED ORDER — FENTANYL 25 MCG/HR TD PT72: MEDICATED_PATCH | TRANSDERMAL | 0 refills | Status: DC

## 2019-04-03 ENCOUNTER — Telehealth: Payer: Self-pay | Admitting: *Deleted

## 2019-04-03 NOTE — Telephone Encounter (Signed)
Faxed notice of approval to [facility] at [phone]. Received fax confirmation.

## 2019-04-03 NOTE — Telephone Encounter (Signed)
Rep for [facility] called to inform medication has been approved for  04/02/19-04/01/20

## 2019-04-03 NOTE — Telephone Encounter (Signed)
Submitted PA tramadol on CMM. [id number]. Waiting on determination from [place].

## 2019-04-05 DIAGNOSIS — M1712 Unilateral primary osteoarthritis, left knee: Secondary | ICD-10-CM | POA: Diagnosis not present

## 2019-04-05 DIAGNOSIS — E781 Pure hyperglyceridemia: Secondary | ICD-10-CM | POA: Diagnosis not present

## 2019-04-05 DIAGNOSIS — F325 Major depressive disorder, single episode, in full remission: Secondary | ICD-10-CM | POA: Diagnosis not present

## 2019-04-05 DIAGNOSIS — M19041 Primary osteoarthritis, right hand: Secondary | ICD-10-CM | POA: Diagnosis not present

## 2019-04-13 DIAGNOSIS — M4180 Other forms of scoliosis, site unspecified: Secondary | ICD-10-CM | POA: Diagnosis not present

## 2019-04-13 DIAGNOSIS — M545 Low back pain: Secondary | ICD-10-CM | POA: Diagnosis not present

## 2019-04-13 DIAGNOSIS — M5416 Radiculopathy, lumbar region: Secondary | ICD-10-CM | POA: Diagnosis not present

## 2019-04-16 ENCOUNTER — Other Ambulatory Visit: Payer: Self-pay

## 2019-04-16 ENCOUNTER — Telehealth: Payer: Self-pay | Admitting: Neurology

## 2019-04-16 ENCOUNTER — Other Ambulatory Visit: Payer: Self-pay | Admitting: *Deleted

## 2019-04-16 MED ORDER — AMPHETAMINE-DEXTROAMPHETAMINE 20 MG PO TABS: 20.0000 mg | ORAL_TABLET | Freq: Two times a day (BID) | ORAL | 0 refills | Status: DC | PRN

## 2019-04-16 NOTE — Telephone Encounter (Signed)
I called and spoke with [facility]. They are concerned about her opiate, benzo and stimulant use of medications. Advised per Dr. Felecia Shelling that she has been on these medications long term, stable and we are closely monitoring her. She verbalized understanding. Nothing further needed.

## 2019-04-16 NOTE — Telephone Encounter (Signed)
Medication has already been filled. Confirmation from the pharmacy was at 8:29 am

## 2019-04-16 NOTE — Telephone Encounter (Signed)
Sheri Burns  [place], [place] Is asking for a call from [place], [place] re: a mixture of opioids , its a severe drug interaction. Please call

## 2019-04-17 ENCOUNTER — Other Ambulatory Visit: Payer: Self-pay | Admitting: *Deleted

## 2019-04-17 MED ORDER — GABAPENTIN 600 MG PO TABS: ORAL_TABLET | ORAL | 3 refills | Status: DC

## 2019-04-17 MED ORDER — OXYBUTYNIN CHLORIDE ER 10 MG PO TB24: 10.0000 mg | ORAL_TABLET | Freq: Every day | ORAL | 3 refills | Status: DC

## 2019-04-23 ENCOUNTER — Ambulatory Visit: Payer: Medicare Other | Admitting: Neurology

## 2019-04-23 ENCOUNTER — Encounter: Payer: Self-pay | Admitting: Neurology

## 2019-04-23 ENCOUNTER — Other Ambulatory Visit: Payer: Self-pay

## 2019-04-23 VITALS — BP 156/74 | HR 87 | Temp 97.1°F | Ht 65.0 in | Wt 128.5 lb

## 2019-04-23 DIAGNOSIS — G35 Multiple sclerosis: Secondary | ICD-10-CM | POA: Diagnosis not present

## 2019-04-23 DIAGNOSIS — Z79891 Long term (current) use of opiate analgesic: Secondary | ICD-10-CM

## 2019-04-23 DIAGNOSIS — M5432 Sciatica, left side: Secondary | ICD-10-CM

## 2019-04-23 DIAGNOSIS — R208 Other disturbances of skin sensation: Secondary | ICD-10-CM | POA: Diagnosis not present

## 2019-04-23 DIAGNOSIS — F418 Other specified anxiety disorders: Secondary | ICD-10-CM

## 2019-04-23 MED ORDER — FENTANYL 25 MCG/HR TD PT72: MEDICATED_PATCH | TRANSDERMAL | 0 refills | Status: DC

## 2019-04-23 NOTE — Progress Notes (Signed)
[facility]  PATIENT: Sheri Burns DOB: 30-May-1955  REFERRING CLINICIAN: Thressa Sheller  HISTORY FROM: Patient REASON FOR VISIT: MS   HISTORICAL  CHIEF COMPLAINT:  Chief Complaint  Patient presents with  . Follow-up    RM 12, alone. Last seen 12/14/18. She had fever, chills, achiness after 2nd covid-19 vaccine 04/19/19.   . Multiple Sclerosis    On leflunomide, tolerating well.     HISTORY OF PRESENT ILLNESS:  Sheri Burns is a 64 year old woman with MS.     Update 04/23/2019: She feels she is stable for the most part.   She has no MS exacerbations  Her main problem is her back ans leg pain.  She had an MRI of the lumbar spine that we discussed in the past.   She just had an ESI with some benefit but pain is returning.   She may need an extensive operation.    Her husband passed away (age 83) 2 weeks ago.   He had Alzheimer's and multiple medical issues.   She feels she is handling his passing well.     She had her second Moderna vaccination and she had chills x 2 days.   I reviewed her L-spine MRI ([facility]).    It does show significant degenerative changes at several levels.  At L4-L5 there appears to be a disc herniation towards the left that could be compressing the left L5 nerve root.  At L3-L4, there are changes more to the right that could affect the right L4 nerve root.  There are also degenerative changes at L1-L2 and L2-L3 though there does not appear to be nerve root compression at those levels.   Update 12/14/2018: She is leflunomide and tolerates it well.  Before that she was on Aubagio but there were issues with co-pays.  She denies any exacerbations though she feels more dizzy at times..    Balance is fine and she has no falls..   Her husnband has been sick adding mroestress.  Leg strength is about the same as last visit with the left leg being a little weaker than the right.  She gets dysesthesias in the legs.  She notes some  fatigue.  Her MS-related attention deficit is improved with Adderall.  She is noting more lower back pain.   Gabapentin has not helped much.  Cymbalta has not helped the pain.    She is on fentanyl patches.   She sometimes uses Solonpas numbing patches.   She is on methocarbamol and clonazepam which should help for spasms.      Update 08/07/2018 (video) She feels her MS has been stable.  She denies any definite exacerbation though she has one day of vertigo.   She is on leflunomide (had high copay on Aubagio). Walking is the same as last visit (a little off balanced).   Leg strength is the same.   The left leg is a little weak and sometimes gives way but no falls.     She has pain, helped by Fentanyl, Gabapentin and duloxetine.   Pain is in her back but also in the left leg with a more dysesthetic quality.  She notes more fatigue the last couple months.   She notes having to take care of her 66 yo granddaughter since [place].  Her MS related ADD is better with Adderall.  She needs a refill   Bladder is overactive but stable.   Vision is fine.     Her husband had  pneumonia in March so they are socially distancing with visits from daughter/grandkids only.  She notes mild depression.  She is on Cymbalta.  She just had her CTS surgery by Dr. Caralyn Guile and she felt better quickly.   Pain and numbness resolved completely.   She is taking Vit D   Update 01/26/2018: She feels her MS has been stable.   She is on leflunomide (had high copay on Aubagio). Walking is the same as last visit (a little off balanced).   Leg strength is the same.   The left leg is a little weak and sometimes gives way.       She has continued pain, helped by Fentanyl.     She has fatigue and MS related ADD helped by Adderall.     She just had her CTS surgery last month and she felt better the next day.   Pain and numbness resolved completely.   She saw Dr. Caralyn Guile  Impression (10/18/2017): This NCV/EMG study shows the following: 1.     Moderately severe right median neuropathy at the wrist (carpal tunnel syndrome).   Chronic denervation changes were noted in the APB muscle. 2.    Borderline left median neuropathy at the wrist. 3.    There is no evidence of a significant cervical radiculopathy on the right.   Update 09/13/2017: She is having more left buttock/hip pain.  Pain will sometimes radiate to the toes but more recently is radiating to the front of the thigh.   Pain is worse if she leans over or bends to the side while standing.    Years ago, she was receiving ESI's for LB and right leg pain.      She is on fentanyl 25 mg patches and tolerates them well.   No drug seeking behavior and NCCSRS has been reviewed in the past.     Her MS is stable.   She is on [place] and tolerates it well.   She has no recent exacerbations.     She has left leg mild weakness and dysesthesia.  She is noting numbness in both hands..    Her vision is fine.    She has fatigue.   Sleep is poor due to sleep maintenance insomnia.     She takes clonazepam at night.  Update 06/22/2017: She feels her MS is stable.   She is on leflunomide and tolerates it well.   She has no recent exacerbations.   She notes mild reduced balance but gait is stable.   No falls.    Strength is slightly weak in the left leg and she has left > right leg dysesthesias.    She also has sciatic type pain on the left.   She is on fentanyl 25 mcg and clonazepam and tramadol qid  [name] is helping her bladder and she tolerates it well.  However, when she was coughing a lot associated with a mild pneumonia she did have some leakage   vision is doing ok.    She has sleep maintenance insomnia waking up several times every night.   She has some daytime fatigue.    She notes some depression and has stess with husband's illness.    Update 02/07/2017:    Her MS is stable.   She tolerates the Leflunomide well.   She has not had any exacerbation on it.      She feels her gait is stable. At times her  legs are tired and balance can be  slightly off but she has not had any falls. There will be some numbness and tingling in the left leg  She is still having left leg pain.   It radiates from the buttock into the left leg to the foot (top > bottom).   She denies weakness.  No bladder changes.    Years ago, she had a couple ESI's with benefit.   She got 2 month benefit from a piriformis muscle injection at the last visit.  Fentanyl helps the pain.   Mood is stable. She notes fatigue and attentional deficits, both help with Adderall.   From 08/12/2016: Pain:   She reports new pain in the left buttock that radiates down the leg. She also has numbness in the left anterolateral thigh and some pain in the lateral groin region.   In the past, she had similar pain in the right buttock that improved after trigger point injection.  MS:   Because her Aubagio co-pay was very high, I placed her on leflunomide. She is tolerating it well and has had no MS exacerbation.  Her last MRI of the brain was some are 2016 and we discussed getting another one to determine if she is having any subclinical activity that would make [place] consider a different medication.  Gait/strength/sensation: She reports that her gait is stable. She still notes mild weakness in her legs and mild problems with her balance. She notes numbness and sometimes painful tingling in the left leg.  Bladder:   She denies any change in her bladder.   Fatigue: She continues to report difficulties with physical and cognitive fatigue. This is improved with Adderall  MS History:  She was diagnosed with multiple sclerosis more than 20 years ago. Initially, she was treated with Betaseron then Copaxone but has not been on any disease modifying therapy for about 8 years. During this time, she has had occasional  Exacerbations with more fatigue and gait issues.  She also has noted mild cognitive dysfunction worsening over the past 5 or 6 years. Her MRI of the brain  performed 08/31/2012 shows foci that are predominantly periventricular. They are consistent with the diagnosis of multiple sclerosis. When this MRI was compared to an MRI dated 10/12/2010, there was one additional small periventricular focus. There were no acute findings on either MRI.   In November 2015, she had an exacerbation with severe fatigue, worsened gait and dysphagia.    She started Aubagio 09/09/14.        REVIEW OF SYSTEMS:  Constitutional: No fevers, chills, sweats, or change in appetite.  She notes a lot of fatigue. She is sleeping well. Eyes: No visual changes, double vision, eye pain Ear, nose and throat: No hearing loss, ear pain, nasal congestion, sore throat Cardiovascular: No chest pain, palpitations Respiratory:  No shortness of breath at rest or with exertion.   No wheezes GastrointestinaI: No nausea, vomiting, diarrhea.  Has constipation Genitourinary:  see above. Musculoskeletal:  reports pain in knees.   Reports neck pain and back pain Integumentary: No rash, pruritus, skin lesions Neurological: as above Psychiatric: Depression and  Anxiety noted Endocrine: No palpitations, diaphoresis, change in appetite, change in weigh or increased thirst Hematologic/Lymphatic:  No anemia, purpura, petechiae. Allergic/Immunologic: No itchy/runny eyes, nasal congestion, recent allergic reactions, rashes  ALLERGIES: Allergies  Allergen Reactions  . Amoxicillin Hives and Itching  . Codeine Nausea And Vomiting    vomiting  . Demerol [Meperidine] Nausea And Vomiting  . Ivp Dye [Iodinated Diagnostic Agents]   .  Penicillins   . Stadol [Butorphanol] Other (See Comments)    Per patient psychotic event  . Sulfonamide Derivatives Hives  . Clindamycin/Lincomycin Rash  . Gadolinium Derivatives Hives    HOME MEDICATIONS: Outpatient Medications Prior to Visit  Medication Sig Dispense Refill  . acetaminophen (TYLENOL) 500 MG tablet Take 500 mg by mouth 4 (four) times daily.    Marland Kitchen  amphetamine-dextroamphetamine (ADDERALL) 20 MG tablet Take 1 tablet (20 mg total) by mouth 2 (two) times daily as needed. 60 tablet 0  . ARIPiprazole (ABILIFY) 2 MG tablet Take 1 tablet (2 mg total) by mouth daily. 30 tablet 5  . Calcium Carb-Cholecalciferol (CALCIUM 600 + D PO) Take 1 tablet by mouth daily.    . clonazePAM (KLONOPIN) 1 MG tablet Take 1 tablet (1 mg total) by mouth 3 (three) times daily as needed. 270 tablet 1  . denosumab (PROLIA) 60 MG/ML SOSY injection Inject 60 mg into the skin every 6 (six) months. 1 each 1  . DULoxetine (CYMBALTA) 60 MG capsule Take 1 capsule (60 mg total) by mouth daily. 90 capsule 3  . gabapentin (NEURONTIN) 600 MG tablet TAKE 1 TABLET 4 TIMES A DAY 360 tablet 3  . leflunomide (ARAVA) 20 MG tablet ONE WHOLE TABLET DAILY BY MOUTH 90 tablet 3  . meloxicam (MOBIC) 7.5 MG tablet Take 1 tablet (7.5 mg total) by mouth daily. 30 tablet 5  . methocarbamol (ROBAXIN) 500 MG tablet TAKE 1 TABLET BY MOUTH EVERY 8 HOURS AS NEEDED FOR MUSCLE SPASMS 270 tablet 1  . Multiple Vitamin (MULTIVITAMIN) tablet Take 1 tablet by mouth daily.    Marland Kitchen oxybutynin (DITROPAN-XL) 10 MG 24 hr tablet Take 1 tablet (10 mg total) by mouth daily. 90 tablet 3  . traMADol (ULTRAM) 50 MG tablet TAKE 1 TABLET EVERY SIX HOURS AS NEEDED (8 AM, 12 NOON, 4 PM AND 8 PM) 120 tablet 3  . fentaNYL (DURAGESIC) 25 MCG/HR Place 1 patch (25 mcg total) onto the skin every 3 (three) days. 10 patch 0   No facility-administered medications prior to visit.    PAST MEDICAL HISTORY: Past Medical History:  Diagnosis Date  . Anxiety   . Bulging lumbar disc 1980  . Chronic pansinusitis   . Colon polyp   . Depression   . GERD (gastroesophageal reflux disease)   . Gynecological examination    sees Dr. Elyse Hsu   . Headache(784.0)   . Multiple sclerosis ([place]) since 1992   Dr. Arlice Colt in [facility]  . Multiple thyroid nodules 1995   per pt. resolved on its own follwed by endo then released  .  Neurogenic bladder disorder 12/2009   w/ [facility] dx. Dr. McDiarmid  . Neuropathy   . Osteoarthritis   . Osteoporosis    last DEXA 2018  . Overactive bladder    sees Dr. Nicki Reaper McDiarmid   . Pancreatitis 1977  . Ulcerative proctitis ([place])   . Vision abnormalities     PAST SURGICAL HISTORY: Past Surgical History:  Procedure Laterality Date  . APPENDECTOMY    . breast ruptured  left   ductal cyst  . CARPAL TUNNEL RELEASE Right   . CHOLECYSTECTOMY    . COLONOSCOPY  01/10/09   repeat in 5 yrs Dr. Wilford Corner  . DILATION AND CURETTAGE OF UTERUS    . LAPAROSCOPY     x 3  . left knee surgery  1978   to remove bone tumor    . NASAL SINUS  SURGERY Bilateral 01/13/2015   Procedure: ENDOSCOPIC SINUS SURGERY;  Surgeon: Izora Gala, MD;  Location: [facility];  Service: ENT;  Laterality: Bilateral;  . NASAL SINUS SURGERY  05/2017  . NASAL TURBINATE REDUCTION    . pubo vaginal sling  10/2003  . TONSILLECTOMY    . vaginal cystocele repair     anterior  . VAGINAL HYSTERECTOMY  1995   AUB & Endometriosis  . widom teeth removal      FAMILY HISTORY: Family History  Problem Relation Age of Onset  . Osteoporosis Mother        severe  . Cancer Mother 45       colon /Breast also m. niece at 42's  . Breast cancer Mother   . Osteopenia Sister        X 2  . Breast cancer Other     SOCIAL HISTORY:  Social History   Socioeconomic History  . Marital status: Married    Spouse name: Jori Moll  . Number of children: 1  . Years of education: 76  . Highest education level: Not on file  Occupational History  . Occupation: Nurse, learning disability: UNEMPLOYED  . Occupation: [employer], occupational  Tobacco Use  . Smoking status: Former Smoker    Packs/day: 1.00    Years: 5.00    Pack years: 5.00    Types: Cigarettes    Quit date: 03/09/1979    Years since quitting: 40.1  . Smokeless tobacco: Never Used  Substance and Sexual Activity  . Alcohol use: No  . Drug  use: No  . Sexual activity: Not Currently    Partners: Male    Birth control/protection: Surgical  Other Topics Concern  . Not on file  Social History Narrative  . Not on file   Social Determinants of Health   Financial Resource Strain:   . Difficulty of Paying Living Expenses: Not on file  Food Insecurity:   . Worried About Charity fundraiser in the Last Year: Not on file  . Ran Out of Food in the Last Year: Not on file  Transportation Needs:   . Lack of Transportation (Medical): Not on file  . Lack of Transportation (Non-Medical): Not on file  Physical Activity:   . Days of Exercise per Week: Not on file  . Minutes of Exercise per Session: Not on file  Stress:   . Feeling of Stress : Not on file  Social Connections:   . Frequency of Communication with Friends and Family: Not on file  . Frequency of Social Gatherings with Friends and Family: Not on file  . Attends Religious Services: Not on file  . Active Member of Clubs or Organizations: Not on file  . Attends Archivist Meetings: Not on file  . Marital Status: Not on file  Intimate Partner Violence:   . Fear of [place] or Ex-Partner: Not on file  . Emotionally Abused: Not on file  . Physically Abused: Not on file  . Sexually Abused: Not on file     PHYSICAL EXAM  Vitals:   04/23/19 0956  BP: (!) 156/74  Pulse: 87  Temp: (!) 97.1 F (36.2 C)  TempSrc: Oral  Weight: 128 lb 8 oz (58.3 kg)  Height: 5\' 5"  (1.651 m)    Body mass index is 21.38 kg/m.   General: The patient is well-developed and well-nourished and in no acute distress.     Neurologic Exam  Mental status: The patient is  alert and oriented x 3 at the time of the examination. The patient has apparent normal recent and remote memory, with an apparently normal attention span and concentration ability.   Speech is normal.  Cranial nerves: Extraocular movements are full.  Facial strength and sensation is normal.  Trapezius strength is  strong..  Motor:  Muscle bulk is normal and tone is increased in legs. Strength is  5 / 5 in all 4 extremities except for 4+/5 APB strength (bilaterally)  Sensory: She has numbness and allodynia in the left L5 distribution. decreased sensation to touch temperature and vibration in the left leg relative to the right leg. Now has normal hand sensation bilaterally  Coordination: Cerebellar testing shows mildly reduced heel to shin but normal finger to nose  Gait and station: Station is stable.  The gait is mildly wide.  Tandem gait is wide. .  Romberg sign is negative.   Reflexes: Deep tendon reflexes are symmetric and fairly normal in the arms. However, she has increased reflexes in the legs with spread at the knees and a couple beats of nonsustained clonus in the ankles...    ______________________________   MULTIPLE SCLEROSIS  Sciatica of left side - Plan: Ambulatory referral to Neurosurgery  Allodynia - Plan: Ambulatory referral to Neurosurgery  Dysesthesia  Depression with anxiety  Chronic prescription opiate use  1.   Continue leflunomide (similar to Aubagio but with much less out-of-pocket expense). 2.   Continue with ESI's   She asked about getting a second opinion regarding her back.   3.  Try to stay active and exercise as tolerated 4.    She is on fentanyl patches and a benzodiazepine for pain and muscle spasticity and Adderall for attention deficit from her MS..   The [place] controlled substance database has been reviewed.  She has been compliant and does not get medication from other doctors.  There is no escalation.   5.    Return to see [place] in 4 months or sooner for new or worsening neurologic symptoms   Kaliegh Willadsen A. Felecia Shelling, MD, PhD [id number], [id number] PM Certified in Neurology, Clinical Neurophysiology, Sleep Medicine, Pain Medicine and Neuroimaging  [facility] [address] [place], [place] [address] [phone]

## 2019-04-25 DIAGNOSIS — M5416 Radiculopathy, lumbar region: Secondary | ICD-10-CM | POA: Diagnosis not present

## 2019-05-02 DIAGNOSIS — M5416 Radiculopathy, lumbar region: Secondary | ICD-10-CM | POA: Diagnosis not present

## 2019-05-07 ENCOUNTER — Other Ambulatory Visit: Payer: Self-pay | Admitting: *Deleted

## 2019-05-07 DIAGNOSIS — G35 Multiple sclerosis: Secondary | ICD-10-CM

## 2019-05-07 MED ORDER — LEFLUNOMIDE 20 MG PO TABS: ORAL_TABLET | ORAL | 3 refills | Status: DC

## 2019-05-08 DIAGNOSIS — M5416 Radiculopathy, lumbar region: Secondary | ICD-10-CM | POA: Diagnosis not present

## 2019-05-14 ENCOUNTER — Other Ambulatory Visit: Payer: Self-pay

## 2019-05-14 ENCOUNTER — Other Ambulatory Visit: Payer: Self-pay | Admitting: *Deleted

## 2019-05-14 MED ORDER — AMPHETAMINE-DEXTROAMPHETAMINE 20 MG PO TABS: 20.0000 mg | ORAL_TABLET | Freq: Two times a day (BID) | ORAL | 0 refills | Status: DC | PRN

## 2019-05-14 MED ORDER — ARIPIPRAZOLE 2 MG PO TABS: 2.0000 mg | ORAL_TABLET | Freq: Every day | ORAL | 5 refills | Status: DC

## 2019-05-14 NOTE — Telephone Encounter (Signed)
Pt requested a refill on her adderall RX. [place] Controlled Substance Registry checked. Pt is up to date on her appts. Pt is due for a refill on adderall. Last fill on 04/16/19 from Dr. Felecia Shelling.

## 2019-05-15 DIAGNOSIS — M5416 Radiculopathy, lumbar region: Secondary | ICD-10-CM | POA: Diagnosis not present

## 2019-05-21 ENCOUNTER — Other Ambulatory Visit: Payer: Self-pay | Admitting: *Deleted

## 2019-05-21 MED ORDER — DULOXETINE HCL 60 MG PO CPEP: 60.0000 mg | ORAL_CAPSULE | Freq: Every day | ORAL | 3 refills | Status: DC

## 2019-05-25 DIAGNOSIS — M5416 Radiculopathy, lumbar region: Secondary | ICD-10-CM | POA: Diagnosis not present

## 2019-05-30 ENCOUNTER — Other Ambulatory Visit: Payer: Self-pay

## 2019-05-31 MED ORDER — FENTANYL 25 MCG/HR TD PT72: MEDICATED_PATCH | TRANSDERMAL | 0 refills | Status: DC

## 2019-06-07 ENCOUNTER — Other Ambulatory Visit: Payer: Self-pay | Admitting: *Deleted

## 2019-06-07 MED ORDER — CLONAZEPAM 1 MG PO TABS: 1.0000 mg | ORAL_TABLET | Freq: Three times a day (TID) | ORAL | 1 refills | Status: DC | PRN

## 2019-06-07 MED ORDER — METHOCARBAMOL 500 MG PO TABS: ORAL_TABLET | ORAL | 1 refills | Status: DC

## 2019-06-11 ENCOUNTER — Other Ambulatory Visit: Payer: Self-pay

## 2019-06-11 MED ORDER — AMPHETAMINE-DEXTROAMPHETAMINE 20 MG PO TABS: 20.0000 mg | ORAL_TABLET | Freq: Two times a day (BID) | ORAL | 0 refills | Status: DC | PRN

## 2019-06-12 ENCOUNTER — Other Ambulatory Visit: Payer: Self-pay | Admitting: Neurology

## 2019-06-19 DIAGNOSIS — Z711 Person with feared health complaint in whom no diagnosis is made: Secondary | ICD-10-CM | POA: Diagnosis not present

## 2019-07-01 ENCOUNTER — Other Ambulatory Visit: Payer: Self-pay | Admitting: Neurology

## 2019-07-01 ENCOUNTER — Other Ambulatory Visit: Payer: Self-pay

## 2019-07-02 MED ORDER — FENTANYL 25 MCG/HR TD PT72: MEDICATED_PATCH | TRANSDERMAL | 0 refills | Status: DC

## 2019-07-02 MED ORDER — AMPHETAMINE-DEXTROAMPHETAMINE 20 MG PO TABS: 20.0000 mg | ORAL_TABLET | Freq: Two times a day (BID) | ORAL | 0 refills | Status: DC | PRN

## 2019-07-09 ENCOUNTER — Other Ambulatory Visit: Payer: Self-pay

## 2019-07-09 MED ORDER — AMPHETAMINE-DEXTROAMPHETAMINE 20 MG PO TABS: 20.0000 mg | ORAL_TABLET | Freq: Two times a day (BID) | ORAL | 0 refills | Status: DC | PRN

## 2019-07-25 ENCOUNTER — Other Ambulatory Visit: Payer: Self-pay | Admitting: Neurology

## 2019-07-25 NOTE — Telephone Encounter (Signed)
Checked drug registry. She last refilled tramadol 06/07/19 #92 and 06/25/19 #84. This would equal a total of 176 tablets. Regular rx usually is for #120/30 days.  Pt last seen 04/23/2019 and next f/u 08/27/19

## 2019-07-26 ENCOUNTER — Telehealth: Payer: Self-pay | Admitting: Neurology

## 2019-07-26 MED ORDER — TRAMADOL HCL 50 MG PO TABS: ORAL_TABLET | ORAL | 5 refills | Status: DC

## 2019-07-26 NOTE — Telephone Encounter (Signed)
Spoke with Dr. Felecia Shelling. He states pt can receive #90 at the earliest on 08/03/19. We are going to send in a new script for #90, 5 refills to avoid any further issues in the future. She should get #90/30 days.

## 2019-07-26 NOTE — Telephone Encounter (Signed)
Called pharmacy back and spoke with Barnett Applebaum. Pt is referring to tramadol not the clonazepam. She last refilled clonazepam 06/07/2019 #270.  She last refilled the tramadol 06/07/19 #92 and 06/25/19 #84. This would equal 176 received for the month of April. She should only be getting 120/month per Dr. Felecia Shelling. I called pharmacy back and spoke with Barnett Applebaum. She states pt insurance only allows them fill 92 tablets per month. So for each fill they give her 92 tablets instead of 28 tabs to equal the difference. Advised I will need to speak with MD and call back to advise when she would be due for next fill and how we would like them to fill medication moving forward.

## 2019-07-26 NOTE — Telephone Encounter (Signed)
I called pharmacy back and spoke with Barnett Applebaum. Relayed below message. She will void any rx's on file for tramadol. Aware we will be sending a new one over. Advised I will also update the patient. She verbalized understanding.

## 2019-07-26 NOTE — Telephone Encounter (Signed)
Barnett Applebaum from [facility] called needing to speak to RN to explain a misunderstanding on the pt's traMADol (ULTRAM) 50 MG tablet. Please advise.

## 2019-07-27 NOTE — Telephone Encounter (Signed)
On call - MD The patients new refill of Tramadol was restricted to be filled not before 08-03-2019, but  is still for 4 tabs Tramadol per day, q 6 hours. The Pharmacist's concern is  that if patient should be changed to 3 a day, 90# per month, it is still not reflected in Dr. Garth Bigness most [place] Tramadol prescription.  PLEASE address this Monday.   CD

## 2019-07-27 NOTE — Telephone Encounter (Signed)
[email or web address] - has called asking if the refill of pt's tramadol can be filled today (even though the script from Dr Felecia Shelling says dont fill before 05-28) Lorie is asking on call Dr to call on this urgent request.  Pt is out of the medication.  The pharmacist name is Rowdi and can be reached at [phone]

## 2019-07-30 NOTE — Telephone Encounter (Signed)
Called pharmacy back and spoke with [place]. Dr. Brett Fairy called back and authorized fill. Pt picked up 07/27/19. Advised Dr. Felecia Shelling ok with early fill, disregard fill on or after 08/03/19. She verbalized understanding. Nothing further needed.

## 2019-07-30 NOTE — Telephone Encounter (Signed)
This has been resolved, see other note.

## 2019-08-06 ENCOUNTER — Other Ambulatory Visit: Payer: Self-pay

## 2019-08-07 MED ORDER — FENTANYL 25 MCG/HR TD PT72: MEDICATED_PATCH | TRANSDERMAL | 0 refills | Status: DC

## 2019-08-11 ENCOUNTER — Other Ambulatory Visit: Payer: Self-pay

## 2019-08-13 MED ORDER — AMPHETAMINE-DEXTROAMPHETAMINE 20 MG PO TABS: 20.0000 mg | ORAL_TABLET | Freq: Two times a day (BID) | ORAL | 0 refills | Status: DC | PRN

## 2019-08-22 DIAGNOSIS — J3089 Other allergic rhinitis: Secondary | ICD-10-CM | POA: Diagnosis not present

## 2019-08-22 DIAGNOSIS — J3081 Allergic rhinitis due to animal (cat) (dog) hair and dander: Secondary | ICD-10-CM | POA: Diagnosis not present

## 2019-08-22 DIAGNOSIS — J301 Allergic rhinitis due to pollen: Secondary | ICD-10-CM | POA: Diagnosis not present

## 2019-08-22 DIAGNOSIS — J338 Other polyp of sinus: Secondary | ICD-10-CM | POA: Diagnosis not present

## 2019-08-24 ENCOUNTER — Other Ambulatory Visit: Payer: Self-pay | Admitting: Internal Medicine

## 2019-08-24 DIAGNOSIS — Z1231 Encounter for screening mammogram for malignant neoplasm of breast: Secondary | ICD-10-CM

## 2019-08-27 ENCOUNTER — Encounter: Payer: Self-pay | Admitting: Neurology

## 2019-08-27 ENCOUNTER — Telehealth: Payer: Self-pay | Admitting: Neurology

## 2019-08-27 ENCOUNTER — Other Ambulatory Visit: Payer: Self-pay

## 2019-08-27 ENCOUNTER — Ambulatory Visit: Payer: Medicare Other | Admitting: Neurology

## 2019-08-27 VITALS — BP 158/81 | HR 82 | Ht 65.0 in | Wt 130.5 lb

## 2019-08-27 DIAGNOSIS — R3915 Urgency of urination: Secondary | ICD-10-CM

## 2019-08-27 DIAGNOSIS — Z79899 Other long term (current) drug therapy: Secondary | ICD-10-CM | POA: Diagnosis not present

## 2019-08-27 DIAGNOSIS — G35 Multiple sclerosis: Secondary | ICD-10-CM

## 2019-08-27 DIAGNOSIS — R260 Ataxic gait: Secondary | ICD-10-CM

## 2019-08-27 DIAGNOSIS — E559 Vitamin D deficiency, unspecified: Secondary | ICD-10-CM

## 2019-08-27 DIAGNOSIS — M5432 Sciatica, left side: Secondary | ICD-10-CM

## 2019-08-27 DIAGNOSIS — R208 Other disturbances of skin sensation: Secondary | ICD-10-CM | POA: Diagnosis not present

## 2019-08-27 NOTE — Telephone Encounter (Signed)
[employer] medicare order sent to [facility]. No auth they will reach out to the patient to schedule.  

## 2019-08-27 NOTE — Progress Notes (Signed)
[facility]  PATIENT: Sheri Burns DOB: 07-23-1955  REFERRING CLINICIAN: Thressa Sheller  HISTORY FROM: Patient REASON FOR VISIT: MS   HISTORICAL  CHIEF COMPLAINT:  Chief Complaint  Patient presents with  . Multiple Sclerosis    Room 12. She is here for a routine follow up with no new concerns. She has continued taking leflunomide for her MS therapy.    HISTORY OF PRESENT ILLNESS:  Sheri Burns is a 64 year old woman with MS.     Update 08/27/2019: She feels her MS is stable.   She is on leflunomide and tolerates it well.   She denies difficulty with gait, strength, sensation.   Vision is fine.    Bladder has mild stress incontinence.    She feels fatigue is better.   She is sleeping better.    She has not needed to go back for any more ESI's.   She exercises and does exercises learned in PT.    She sees [facility].    Recent MRI showed multilevel DJD.    She  Her husband passed earlier this year.   She denies significant depression and notes less strss as he had advanced AD and other issues.  He was bed bound and moving him was tough on her back.      Update 04/23/2019: She feels she is stable for the most part.   She has no MS exacerbations  Her main problem is her back ans leg pain.  She had an MRI of the lumbar spine that we discussed in the past.   She just had an ESI with some benefit but pain is returning.   She may need an extensive operation.    Her husband passed away (age 55) 2 weeks ago.   He had Alzheimer's and multiple medical issues.   She feels she is handling his passing well.     She had her second Moderna vaccination and she had chills x 2 days.   I reviewed her L-spine MRI ([facility]).    It does show significant degenerative changes at several levels.  At L4-L5 there appears to be a disc herniation towards the left that could be compressing the left L5 nerve root.  At L3-L4, there are changes more to the right that could  affect the right L4 nerve root.  There are also degenerative changes at L1-L2 and L2-L3 though there does not appear to be nerve root compression at those levels.   Update 12/14/2018: She is leflunomide and tolerates it well.  Before that she was on Aubagio but there were issues with co-pays.  She denies any exacerbations though she feels more dizzy at times..    Balance is fine and she has no falls..   Her husnband has been sick adding mroestress.  Leg strength is about the same as last visit with the left leg being a little weaker than the right.  She gets dysesthesias in the legs.  She notes some fatigue.  Her MS-related attention deficit is improved with Adderall.  She is noting more lower back pain.   Gabapentin has not helped much.  Cymbalta has not helped the pain.    She is on fentanyl patches.   She sometimes uses Solonpas numbing patches.   She is on methocarbamol and clonazepam which should help for spasms.      Update 08/07/2018 (video) She feels her MS has been stable.  She denies any definite exacerbation though she has one day  of vertigo.   She is on leflunomide (had high copay on Aubagio). Walking is the same as last visit (a little off balanced).   Leg strength is the same.   The left leg is a little weak and sometimes gives way but no falls.     She has pain, helped by Fentanyl, Gabapentin and duloxetine.   Pain is in her back but also in the left leg with a more dysesthetic quality.  She notes more fatigue the last couple months.   She notes having to take care of her 48 yo granddaughter since [place].  Her MS related ADD is better with Adderall.  She needs a refill   Bladder is overactive but stable.   Vision is fine.     Her husband had pneumonia in March so they are socially distancing with visits from daughter/grandkids only.  She notes mild depression.  She is on Cymbalta.  She just had her CTS surgery by Dr. Caralyn Guile and she felt better quickly.   Pain and numbness resolved  completely.   She is taking Vit D   Update 01/26/2018: She feels her MS has been stable.   She is on leflunomide (had high copay on Aubagio). Walking is the same as last visit (a little off balanced).   Leg strength is the same.   The left leg is a little weak and sometimes gives way.       She has continued pain, helped by Fentanyl.     She has fatigue and MS related ADD helped by Adderall.     She just had her CTS surgery last month and she felt better the next day.   Pain and numbness resolved completely.   She saw Dr. Caralyn Guile  Impression (10/18/2017): This NCV/EMG study shows the following: 1.    Moderately severe right median neuropathy at the wrist (carpal tunnel syndrome).   Chronic denervation changes were noted in the APB muscle. 2.    Borderline left median neuropathy at the wrist. 3.    There is no evidence of a significant cervical radiculopathy on the right.   Update 09/13/2017: She is having more left buttock/hip pain.  Pain will sometimes radiate to the toes but more recently is radiating to the front of the thigh.   Pain is worse if she leans over or bends to the side while standing.    Years ago, she was receiving ESI's for LB and right leg pain.      She is on fentanyl 25 mg patches and tolerates them well.   No drug seeking behavior and NCCSRS has been reviewed in the past.     Her MS is stable.   She is on [place] and tolerates it well.   She has no recent exacerbations.     She has left leg mild weakness and dysesthesia.  She is noting numbness in both hands..    Her vision is fine.    She has fatigue.   Sleep is poor due to sleep maintenance insomnia.     She takes clonazepam at night.  Update 06/22/2017: She feels her MS is stable.   She is on leflunomide and tolerates it well.   She has no recent exacerbations.   She notes mild reduced balance but gait is stable.   No falls.    Strength is slightly weak in the left leg and she has left > right leg dysesthesias.    She also has  sciatic type pain  on the left.   She is on fentanyl 25 mcg and clonazepam and tramadol qid  [name] is helping her bladder and she tolerates it well.  However, when she was coughing a lot associated with a mild pneumonia she did have some leakage   vision is doing ok.    She has sleep maintenance insomnia waking up several times every night.   She has some daytime fatigue.    She notes some depression and has stess with husband's illness.    Update 02/07/2017:    Her MS is stable.   She tolerates the Leflunomide well.   She has not had any exacerbation on it.      She feels her gait is stable. At times her legs are tired and balance can be slightly off but she has not had any falls. There will be some numbness and tingling in the left leg  She is still having left leg pain.   It radiates from the buttock into the left leg to the foot (top > bottom).   She denies weakness.  No bladder changes.    Years ago, she had a couple ESI's with benefit.   She got 2 month benefit from a piriformis muscle injection at the last visit.  Fentanyl helps the pain.   Mood is stable. She notes fatigue and attentional deficits, both help with Adderall.   From 08/12/2016: Pain:   She reports new pain in the left buttock that radiates down the leg. She also has numbness in the left anterolateral thigh and some pain in the lateral groin region.   In the past, she had similar pain in the right buttock that improved after trigger point injection.  MS:   Because her Aubagio co-pay was very high, I placed her on leflunomide. She is tolerating it well and has had no MS exacerbation.  Her last MRI of the brain was some are 2016 and we discussed getting another one to determine if she is having any subclinical activity that would make [place] consider a different medication.  Gait/strength/sensation: She reports that her gait is stable. She still notes mild weakness in her legs and mild problems with her balance. She notes numbness and  sometimes painful tingling in the left leg.  Bladder:   She denies any change in her bladder.   Fatigue: She continues to report difficulties with physical and cognitive fatigue. This is improved with Adderall  MS History:  She was diagnosed with multiple sclerosis more than 20 years ago. Initially, she was treated with Betaseron then Copaxone but has not been on any disease modifying therapy for about 8 years. During this time, she has had occasional  Exacerbations with more fatigue and gait issues.  She also has noted mild cognitive dysfunction worsening over the past 5 or 6 years. Her MRI of the brain performed 08/31/2012 shows foci that are predominantly periventricular. They are consistent with the diagnosis of multiple sclerosis. When this MRI was compared to an MRI dated 10/12/2010, there was one additional small periventricular focus. There were no acute findings on either MRI.   In November 2015, she had an exacerbation with severe fatigue, worsened gait and dysphagia.    She started Aubagio 09/09/14.        REVIEW OF SYSTEMS:  Constitutional: No fevers, chills, sweats, or change in appetite.  She notes a lot of fatigue. She is sleeping well. Eyes: No visual changes, double vision, eye pain Ear, nose and throat: No hearing  loss, ear pain, nasal congestion, sore throat Cardiovascular: No chest pain, palpitations Respiratory:  No shortness of breath at rest or with exertion.   No wheezes GastrointestinaI: No nausea, vomiting, diarrhea.  Has constipation Genitourinary:  see above. Musculoskeletal:  reports pain in knees.   Reports neck pain and back pain Integumentary: No rash, pruritus, skin lesions Neurological: as above Psychiatric: Depression and  Anxiety noted Endocrine: No palpitations, diaphoresis, change in appetite, change in weigh or increased thirst Hematologic/Lymphatic:  No anemia, purpura, petechiae. Allergic/Immunologic: No itchy/runny eyes, nasal congestion, recent  allergic reactions, rashes  ALLERGIES: Allergies  Allergen Reactions  . Amoxicillin Hives and Itching  . Codeine Nausea And Vomiting    vomiting  . Demerol [Meperidine] Nausea And Vomiting  . Ivp Dye [Iodinated Diagnostic Agents] Hives  . Penicillins Hives  . Stadol [Butorphanol] Other (See Comments)    Per patient psychotic event  . Sulfonamide Derivatives Hives  . Clindamycin/Lincomycin Rash  . Gadolinium Derivatives Hives    HOME MEDICATIONS: Outpatient Medications Prior to Visit  Medication Sig Dispense Refill  . acetaminophen (TYLENOL) 500 MG tablet Take 500 mg by mouth 4 (four) times daily.    Marland Kitchen amphetamine-dextroamphetamine (ADDERALL) 20 MG tablet Take 1 tablet (20 mg total) by mouth 2 (two) times daily as needed. 60 tablet 0  . ARIPiprazole (ABILIFY) 2 MG tablet Take 1 tablet (2 mg total) by mouth daily. 30 tablet 5  . Calcium Carb-Cholecalciferol (CALCIUM 600 + D PO) Take 1 tablet by mouth daily.    . clonazePAM (KLONOPIN) 1 MG tablet Take 1 tablet (1 mg total) by mouth 3 (three) times daily as needed. 270 tablet 1  . denosumab (PROLIA) 60 MG/ML SOSY injection Inject 60 mg into the skin every 6 (six) months. 1 each 1  . DULoxetine (CYMBALTA) 60 MG capsule Take 1 capsule (60 mg total) by mouth daily. 90 capsule 3  . fentaNYL (DURAGESIC) 25 MCG/HR Place 1 patch (25 mcg total) onto the skin every 3 (three) days. 10 patch 0  . gabapentin (NEURONTIN) 600 MG tablet TAKE 1 TABLET 4 TIMES A DAY 360 tablet 3  . leflunomide (ARAVA) 20 MG tablet ONE WHOLE TABLET DAILY BY MOUTH 90 tablet 3  . meloxicam (MOBIC) 7.5 MG tablet Take 1 tablet (7.5 mg total) by mouth daily. 30 tablet 5  . methocarbamol (ROBAXIN) 500 MG tablet TAKE 1 TABLET BY MOUTH EVERY 8 HOURS AS NEEDED FOR MUSCLE SPASMS 270 tablet 1  . Multiple Vitamin (MULTIVITAMIN) tablet Take 1 tablet by mouth daily.    Marland Kitchen oxybutynin (DITROPAN-XL) 10 MG 24 hr tablet Take 1 tablet (10 mg total) by mouth daily. 90 tablet 3  . traMADol  (ULTRAM) 50 MG tablet TAKE 1 TABLET EVERY SIX HOURS AS NEEDED (8 AM, 12 NOON, 4 PM AND 8 PM) 120 tablet 5   No facility-administered medications prior to visit.    PAST MEDICAL HISTORY: Past Medical History:  Diagnosis Date  . Anxiety   . Bulging lumbar disc 1980  . Chronic pansinusitis   . Colon polyp   . Depression   . GERD (gastroesophageal reflux disease)   . Gynecological examination    sees Dr. Elyse Hsu   . Headache(784.0)   . Multiple sclerosis ([place]) since 1992   [facility] Arlice [facility]  . Multiple thyroid nodules 1995   per pt. resolved on its own follwed by endo then released  . Neurogenic bladder disorder 12/2009   w/ [facility] dx. Dr.  McDiarmid  . Neuropathy   . Osteoarthritis   . Osteoporosis    last DEXA 2018  . Overactive bladder    sees Dr. Nicki Reaper McDiarmid   . Pancreatitis 1977  . Ulcerative proctitis ([place])   . Vision abnormalities     PAST SURGICAL HISTORY: Past Surgical History:  Procedure Laterality Date  . APPENDECTOMY    . breast ruptured  left   ductal cyst  . CARPAL TUNNEL RELEASE Right   . CHOLECYSTECTOMY    . COLONOSCOPY  01/10/09   repeat in 5 yrs Dr. Wilford Corner  . DILATION AND CURETTAGE OF UTERUS    . LAPAROSCOPY     x 3  . left knee surgery  1978   to remove bone tumor    . NASAL SINUS SURGERY Bilateral 01/13/2015   Procedure: ENDOSCOPIC SINUS SURGERY;  Surgeon: Izora Gala, MD;  Location: [place];  Service: ENT;  Laterality: Bilateral;  . NASAL SINUS SURGERY  05/2017  . NASAL TURBINATE REDUCTION    . pubo vaginal sling  10/2003  . TONSILLECTOMY    . vaginal cystocele repair     anterior  . VAGINAL HYSTERECTOMY  1995   AUB & Endometriosis  . widom teeth removal      FAMILY HISTORY: Family History  Problem Relation Age of Onset  . Osteoporosis Mother        severe  . Cancer Mother 47       colon /Breast also m. niece at 47's  . Breast cancer Mother   . Osteopenia Sister         X 2  . Breast cancer Other     SOCIAL HISTORY:  Social History   Socioeconomic History  . Marital status: Married    Spouse name: Jori Moll  . Number of children: 1  . Years of education: 21  . Highest education level: Not on file  Occupational History  . Occupation: Nurse, learning disability: UNEMPLOYED  . Occupation: Chestang Psychologist, occupational  Tobacco Use  . Smoking status: Former Smoker    Packs/day: 1.00    Years: 5.00    Pack years: 5.00    Types: Cigarettes    Quit date: 03/09/1979    Years since quitting: 40.4  . Smokeless tobacco: Never Used  Vaping Use  . Vaping Use: Never used  Substance and Sexual Activity  . Alcohol use: No  . Drug use: No  . Sexual activity: Not Currently    Partners: Male    Birth control/protection: Surgical  Other Topics Concern  . Not on file  Social History Narrative  . Not on file   Social Determinants of Health   Financial Resource Strain:   . Difficulty of Paying Living Expenses:   Food Insecurity:   . Worried About Charity fundraiser in the Last Year:   . Arboriculturist in the Last Year:   Transportation Needs:   . Film/video editor (Medical):   Marland Kitchen Lack of Transportation (Non-Medical):   Physical Activity:   . Days of Exercise per Week:   . Minutes of Exercise per Session:   Stress:   . Feeling of Stress :   Social Connections:   . Frequency of Communication with Friends and Family:   . Frequency of Social Gatherings with Friends and Family:   . Attends Religious Services:   . Active Member of Clubs or Organizations:   . Attends Archivist Meetings:   .  Marital Status:   Intimate Partner Violence:   . Fear of [place] or Ex-Partner:   . Emotionally Abused:   Marland Kitchen Physically Abused:   . Sexually Abused:      PHYSICAL EXAM  Vitals:   08/27/19 0948  BP: (!) 158/81  Pulse: 82  Weight: 130 lb 8 oz (59.2 kg)  Height: 5\' 5"  (1.651 m)    Body mass index is 21.72 kg/m.   General: The patient is  well-developed and well-nourished and in no acute distress.     Neurologic Exam  Mental status: The patient is alert and oriented x 3 at the time of the examination. The patient has apparent normal recent and remote memory, with an apparently normal attention span and concentration ability.   Speech is normal.  Cranial nerves: Extraocular movements are full.  Facial strength and sensation is normal.  Trapezius strength is strong..  Motor:  Muscle bulk is normal and tone is increased in legs. Strength is  5 / 5  Sensory: She has numbness and allodynia in the left L5 distribution.  .   Coordination: Cerebellar testing showed good heel to shin but and finger to nose  Gait and station: Station is stable.  The gait is minimally wide and arthritic.  Tandem gait is mildly wide. .  Romberg sign is negative.   Reflexes: Deep tendon reflexes are symmetric and fairly normal in the arms. However, she has increased reflexes in the legs with spread at the knees but no clonus in the ankles...    ______________________________   Multiple sclerosis ([place]) - Plan: MR BRAIN WO CONTRAST, CBC with Differential/Platelet, Hepatic function panel  Sciatica of left side  Dysesthesia  High risk medication use  Urinary urgency  Ataxic gait  Vitamin D deficiency - Plan: VITAMIN D 25 Hydroxy (Vit-D Deficiency, Fractures)  1.  She will continue leflunomide for her MS. 2.   Back pain is doing better.  She will reduce the Robaxin.  If she continues to improve, consider changing fentanyl to a as needed medication. 3.   Try to stay active and exercise as tolerated 4.    She is on fentanyl patches and a prn benzodiazepine for pain and muscle spasticity and Adderall for attention deficit from her MS..   The [place] controlled substance database has been reviewed.  She has been compliant and does not get medication from other doctors.  There is no escalation.   5.    Return to see [place] in 4 months or sooner  for new or worsening neurologic symptoms  35-minute office visit with the majority of the time spent face-to-face for history and physical, discussion/counseling and decision-making.  Additional time with record review and documentation.   Shaelyn Decarli A. Felecia Shelling, MD, PhD 7/47/3403, 70:96 AM Certified in Neurology, Clinical Neurophysiology, Sleep Medicine, Pain Medicine and Neuroimaging  [facility] [address] [place], [place] [address] [phone]

## 2019-08-28 LAB — HEPATIC FUNCTION PANEL
ALT: 15 IU/L (ref 0–32)
AST: 21 IU/L (ref 0–40)
Albumin: 4 g/dL (ref 3.8–4.8)
Alkaline Phosphatase: 63 IU/L (ref 48–121)
Bilirubin Total: 0.2 mg/dL (ref 0.0–1.2)
Bilirubin, Direct: 0.07 mg/dL (ref 0.00–0.40)
Total Protein: 6.6 g/dL (ref 6.0–8.5)

## 2019-08-28 LAB — CBC WITH DIFFERENTIAL/PLATELET
Basophils Absolute: 0.1 10*3/uL (ref 0.0–0.2)
Basos: 2 %
EOS (ABSOLUTE): 0.6 10*3/uL — ABNORMAL HIGH (ref 0.0–0.4)
Eos: 9 %
Hematocrit: 38.6 % (ref 34.0–46.6)
Hemoglobin: 12.6 g/dL (ref 11.1–15.9)
Immature Grans (Abs): 0 10*3/uL (ref 0.0–0.1)
Immature Granulocytes: 0 %
Lymphocytes Absolute: 2.3 10*3/uL (ref 0.7–3.1)
Lymphs: 36 %
MCH: 30.9 pg (ref 26.6–33.0)
MCHC: 32.6 g/dL (ref 31.5–35.7)
MCV: 95 fL (ref 79–97)
Monocytes Absolute: 0.8 10*3/uL (ref 0.1–0.9)
Monocytes: 13 %
Neutrophils Absolute: 2.6 10*3/uL (ref 1.4–7.0)
Neutrophils: 40 %
Platelets: 335 10*3/uL (ref 150–450)
RBC: 4.08 x10E6/uL (ref 3.77–5.28)
RDW: 11.5 % — ABNORMAL LOW (ref 11.7–15.4)
WBC: 6.3 10*3/uL (ref 3.4–10.8)

## 2019-08-28 LAB — VITAMIN D 25 HYDROXY (VIT D DEFICIENCY, FRACTURES): Vit D, 25-Hydroxy: 41.5 ng/mL (ref 30.0–100.0)

## 2019-09-04 ENCOUNTER — Other Ambulatory Visit: Payer: Self-pay

## 2019-09-05 ENCOUNTER — Other Ambulatory Visit: Payer: Self-pay | Admitting: Neurology

## 2019-09-05 MED ORDER — FENTANYL 25 MCG/HR TD PT72: MEDICATED_PATCH | TRANSDERMAL | 0 refills | Status: DC

## 2019-09-10 ENCOUNTER — Other Ambulatory Visit: Payer: Self-pay

## 2019-09-11 MED ORDER — AMPHETAMINE-DEXTROAMPHETAMINE 20 MG PO TABS: 20.0000 mg | ORAL_TABLET | Freq: Two times a day (BID) | ORAL | 0 refills | Status: DC | PRN

## 2019-09-12 NOTE — Progress Notes (Signed)
64 y.o. G59P1001 Widowed Caucasian female here for annual exam.    Husband passed with Alzheimer's in 03/2019. Good support system of family and friends.   Her back is giving her difficulty.  She has nerve root compression and she had an epidural which improved her pain.  She also has scoliosis.   She has MS which is under control.  No change in her bladder control.  Takes Ditropan XL 10 mg daily.  Sees Dr. Matilde Sprang.   She takes Prolia for osteoporosis through her PCP.   She received her Covid vaccine in February.   Redecorating her townhouse.  PCP:  Lavone Orn, MD  Patient's last menstrual period was 07/06/1993.           Sexually active: No.  The Salemi method of family planning is status post hysterectomy--ovaries remain.    Exercising: No.  The patient does not participate in regular exercise at present. Smoker:  no  Health Maintenance: Pap: TVH 1995 History of abnormal Pap:  no MMG: 10-06-18 3D/Neg/density C/BiRads1--appt.10-08-19 Colonoscopy: 12/2013;next due 12/2019 BMD: 12-18-18  Result :Osteoporosis--PCP TDaP:  2017 Gardasil:   no HIV: Never Hep C: Never Screening Labs:  PCP.    reports that she quit smoking about 40 years ago. Her smoking use included cigarettes. She has a 5.00 pack-year smoking history. She has never used smokeless tobacco. She reports that she does not drink alcohol and does not use drugs.  Past Medical History:  Diagnosis Date  . Anxiety   . Bulging lumbar disc 1980  . Chronic pansinusitis   . Colon polyp   . Depression   . GERD (gastroesophageal reflux disease)   . Gynecological examination    sees Dr. Elyse Hsu   . Headache(784.0)   . Multiple sclerosis ([place]) since 1992   Dr. Arlice Colt in [facility]  . Multiple thyroid nodules 1995   per pt. resolved on its own follwed by endo then released  . Neurogenic bladder disorder 12/2009   w/ urge & stress inc. dx. Dr. McDiarmid  . Neuropathy   . Osteoarthritis   .  Osteoporosis    last DEXA 2018  . Overactive bladder    sees Dr. Nicki Reaper McDiarmid   . Pancreatitis 1977  . Ulcerative proctitis ([place])   . Vision abnormalities     Past Surgical History:  Procedure Laterality Date  . APPENDECTOMY    . breast ruptured  left   ductal cyst  . CARPAL TUNNEL RELEASE Right   . CHOLECYSTECTOMY    . COLONOSCOPY  01/10/09   repeat in 5 yrs Dr. Wilford Corner  . DILATION AND CURETTAGE OF UTERUS    . LAPAROSCOPY     x 3  . left knee surgery  1978   to remove bone tumor    . NASAL SINUS SURGERY Bilateral 01/13/2015   Procedure: ENDOSCOPIC SINUS SURGERY;  Surgeon: Izora Gala, MD;  Location: [facility];  Service: ENT;  Laterality: Bilateral;  . NASAL SINUS SURGERY  05/2017  . NASAL TURBINATE REDUCTION    . pubo vaginal sling  10/2003  . TONSILLECTOMY    . vaginal cystocele repair     anterior  . VAGINAL HYSTERECTOMY  1995   AUB & Endometriosis  . widom teeth removal      [facility]ations  Medication Sig Dispense Refill  . acetaminophen (TYLENOL) 500 MG tablet Take 500 mg by mouth 4 (four) times daily.    Marland Kitchen amphetamine-dextroamphetamine (ADDERALL) 20 MG tablet Take  1 tablet (20 mg total) by mouth 2 (two) times daily as needed. 60 tablet 0  . ARIPiprazole (ABILIFY) 10 MG tablet Take 1 tablet by mouth daily.    . Calcium Carb-Cholecalciferol (CALCIUM 600 + D PO) Take 1 tablet by mouth daily.    . clonazePAM (KLONOPIN) 1 MG tablet Take 1 tablet (1 mg total) by mouth 3 (three) times daily as needed. 270 tablet 1  . denosumab (PROLIA) 60 MG/ML SOSY injection Inject 60 mg into the skin every 6 (six) months. 1 each 1  . DULoxetine (CYMBALTA) 60 MG capsule Take 1 capsule (60 mg total) by mouth daily. 90 capsule 3  . fentaNYL (DURAGESIC) 25 MCG/HR Place 1 patch (25 mcg total) onto the skin every 3 (three) days. 10 patch 0  . gabapentin (NEURONTIN) 600 MG tablet TAKE 1 TABLET 4 TIMES A DAY 360 tablet 3  . leflunomide (ARAVA) 20 MG  tablet ONE WHOLE TABLET DAILY BY MOUTH 90 tablet 3  . Multiple Vitamin (MULTIVITAMIN) tablet Take 1 tablet by mouth daily.    Marland Kitchen oxybutynin (DITROPAN-XL) 10 MG 24 hr tablet Take 1 tablet (10 mg total) by mouth daily. 90 tablet 3  . traMADol (ULTRAM) 50 MG tablet TAKE 1 TABLET EVERY SIX HOURS AS NEEDED (8 AM, 12 NOON, 4 PM AND 8 PM) 120 tablet 5   No [facility] facility-administered medications for this visit.    Family History  Problem Relation Age of Onset  . Osteoporosis Mother        severe  . Cancer Mother 50       colon /Breast also m. niece at 64's  . Breast cancer Mother   . Osteopenia Sister        X 2  . Breast cancer Other     Review of Systems  All other systems reviewed and are negative.   Exam:   BP (!) 158/86   Pulse 88   Resp 18   Ht 5' 4.5" (1.638 m)   Wt 127 lb 12.8 oz (58 kg)   LMP 07/06/1993   BMI 21.60 kg/m     General appearance: alert, cooperative and appears stated age Head: normocephalic, without obvious abnormality, atraumatic Neck: no adenopathy, supple, symmetrical, trachea midline and thyroid normal to inspection and palpation Lungs: clear to auscultation bilaterally Breasts: normal appearance, no masses or tenderness, No nipple retraction or dimpling, No nipple discharge or bleeding, No axillary adenopathy Heart: regular rate and rhythm Abdomen: soft, non-tender; no masses, no organomegaly Extremities: extremities normal, atraumatic, no cyanosis or edema Skin: skin color, texture, turgor normal. No rashes or lesions Lymph nodes: cervical, supraclavicular, and axillary nodes normal. Neurologic: grossly normal  Pelvic: External genitalia:  no lesions              No abnormal inguinal nodes palpated.              Urethra:  normal appearing urethra with no masses, tenderness or lesions              Bartholins and Skenes: normal                 Vagina: normal appearing vagina with normal color and discharge, no lesions              Cervix:  absent              Pap taken: No. Bimanual Exam:  Uterus:  absent  Adnexa: no mass, fullness, tenderness              Rectal exam: Yes.  .  Confirms.              Anus:  normal sphincter tone, no lesions  Chaperone was present for exam.  Assessment:   Well woman visit with normal exam. Status post TVH. Still has ovaries.  Status post pubovaginal sling. MS and neurogenic bladder. On Ditropan XL. On Cymbalta. Chronic pain. Back pain.  Osteoporosis. On Prolia.  FH colon and breast cancer.  Plan: Mammogram screening discussed. Self breast awareness reviewed. Pap and HR HPV as above. Guidelines for Calcium, Vitamin D, regular exercise program including cardiovascular and weight bearing exercise.   Follow up annually and prn.   After visit summary provided.

## 2019-09-13 ENCOUNTER — Encounter: Payer: Self-pay | Admitting: Obstetrics and Gynecology

## 2019-09-13 ENCOUNTER — Other Ambulatory Visit: Payer: Self-pay

## 2019-09-13 ENCOUNTER — Ambulatory Visit (INDEPENDENT_AMBULATORY_CARE_PROVIDER_SITE_OTHER): Payer: Medicare Other | Admitting: Obstetrics and Gynecology

## 2019-09-13 VITALS — BP 158/86 | HR 88 | Resp 18 | Ht 64.5 in | Wt 127.8 lb

## 2019-09-13 DIAGNOSIS — Z01419 Encounter for gynecological examination (general) (routine) without abnormal findings: Secondary | ICD-10-CM

## 2019-09-13 NOTE — Patient Instructions (Signed)

## 2019-09-18 DIAGNOSIS — I10 Essential (primary) hypertension: Secondary | ICD-10-CM | POA: Diagnosis not present

## 2019-09-19 ENCOUNTER — Ambulatory Visit
Admission: RE | Admit: 2019-09-19 | Discharge: 2019-09-19 | Disposition: A | Discharge status: Home / Self Care | Payer: Medicare Other | Source: Ambulatory Visit | Attending: Neurology | Admitting: Neurology

## 2019-09-19 ENCOUNTER — Telehealth: Payer: Self-pay | Admitting: *Deleted

## 2019-09-19 ENCOUNTER — Other Ambulatory Visit: Payer: Self-pay

## 2019-09-19 DIAGNOSIS — G35 Multiple sclerosis: Secondary | ICD-10-CM | POA: Diagnosis not present

## 2019-09-19 NOTE — Telephone Encounter (Signed)
-----   Message from Britt Bottom, MD sent at 09/19/2019  6:01 PM EDT ----- Please let her know that the MRI of the brain did not show any new MS lesions. She does have chronic sinusitis. If she is having any sinusitis symptoms we can call in a Z-Pak.

## 2019-09-19 NOTE — Telephone Encounter (Signed)
I have spoken to the patient. She verbalized understanding of her MRI brain results. States she has a long, history of chronic sinusitis. She declined offer for antibiotic. She plans to contact her ENT, Dr. Constance Holster. States she is overdue for her follow up.

## 2019-09-25 DIAGNOSIS — N3946 Mixed incontinence: Secondary | ICD-10-CM | POA: Diagnosis not present

## 2019-09-25 DIAGNOSIS — M810 Age-related osteoporosis without current pathological fracture: Secondary | ICD-10-CM | POA: Diagnosis not present

## 2019-09-25 DIAGNOSIS — G35 Multiple sclerosis: Secondary | ICD-10-CM | POA: Diagnosis not present

## 2019-09-25 DIAGNOSIS — N319 Neuromuscular dysfunction of bladder, unspecified: Secondary | ICD-10-CM | POA: Diagnosis not present

## 2019-09-25 DIAGNOSIS — M5416 Radiculopathy, lumbar region: Secondary | ICD-10-CM | POA: Diagnosis not present

## 2019-09-26 DIAGNOSIS — J324 Chronic pansinusitis: Secondary | ICD-10-CM | POA: Diagnosis not present

## 2019-10-02 ENCOUNTER — Other Ambulatory Visit: Payer: Self-pay

## 2019-10-02 MED ORDER — FENTANYL 25 MCG/HR TD PT72: MEDICATED_PATCH | TRANSDERMAL | 0 refills | Status: DC

## 2019-10-08 ENCOUNTER — Other Ambulatory Visit: Payer: Self-pay

## 2019-10-08 ENCOUNTER — Ambulatory Visit
Admission: RE | Admit: 2019-10-08 | Discharge: 2019-10-08 | Disposition: A | Discharge status: Home / Self Care | Payer: Medicare Other | Source: Ambulatory Visit | Attending: Internal Medicine | Admitting: Internal Medicine

## 2019-10-08 DIAGNOSIS — Z1231 Encounter for screening mammogram for malignant neoplasm of breast: Secondary | ICD-10-CM

## 2019-10-08 IMAGING — MG DIGITAL SCREENING BILAT W/ TOMO W/ CAD
8 series · 8 of 24 positions shown · non-contrast
Comparison: Previous exam(s).

CLINICAL DATA: Screening.

EXAM:
DIGITAL SCREENING BILATERAL MAMMOGRAM WITH TOMO AND CAD

[L CC synth-2D]
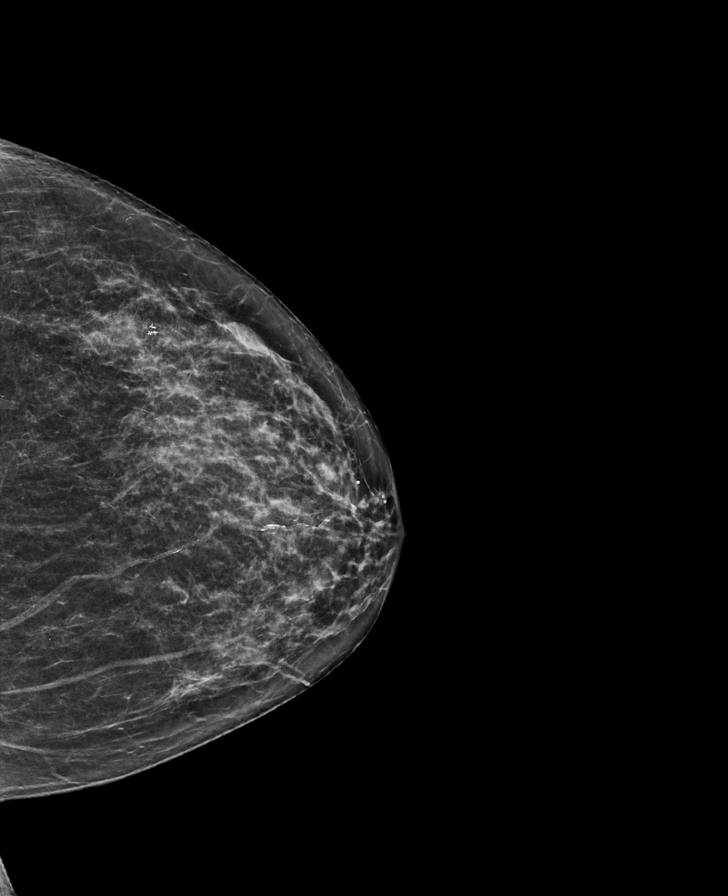

[R MLO synth-2D]
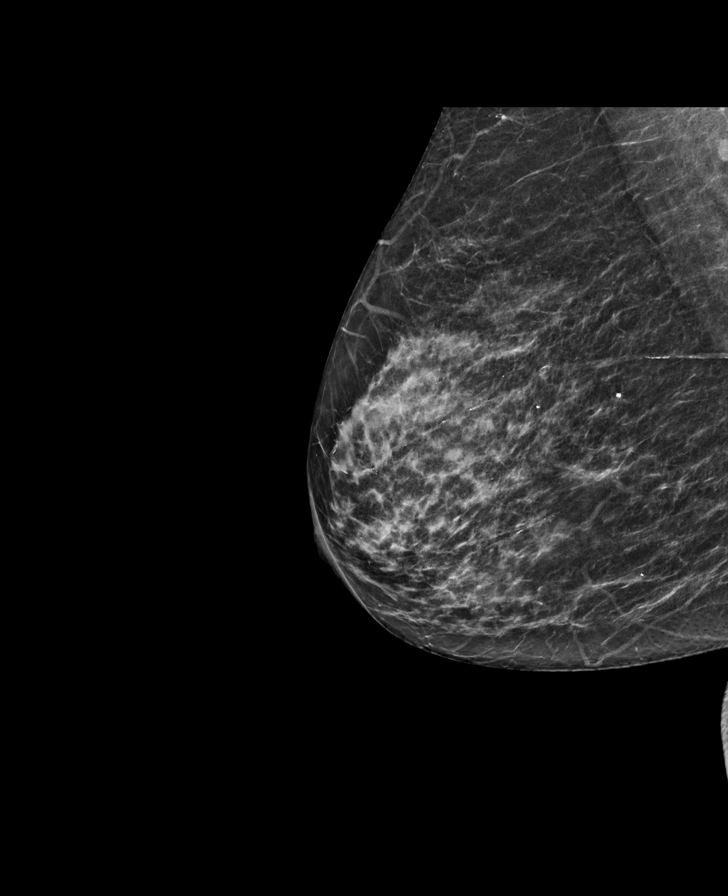

[R CC synth-2D]
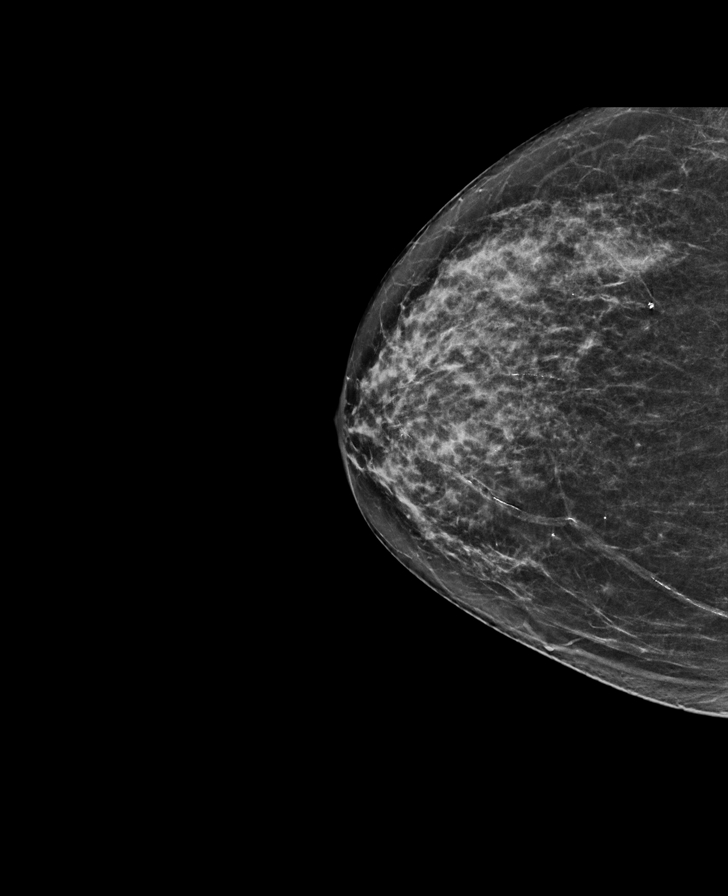

[L MLO synth-2D]
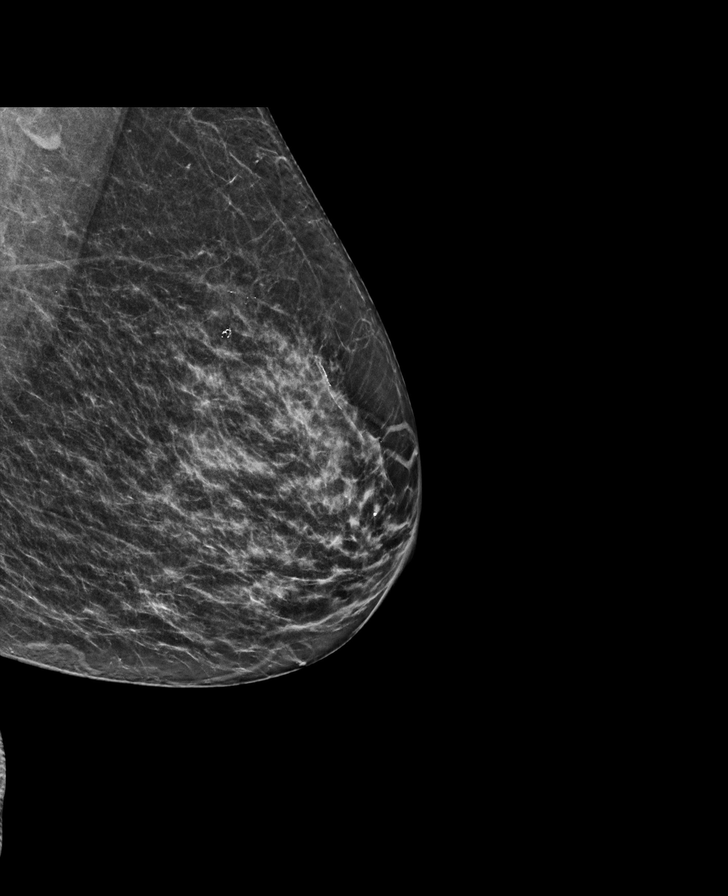

[R CC tomo · tomo slice 31/60.0]
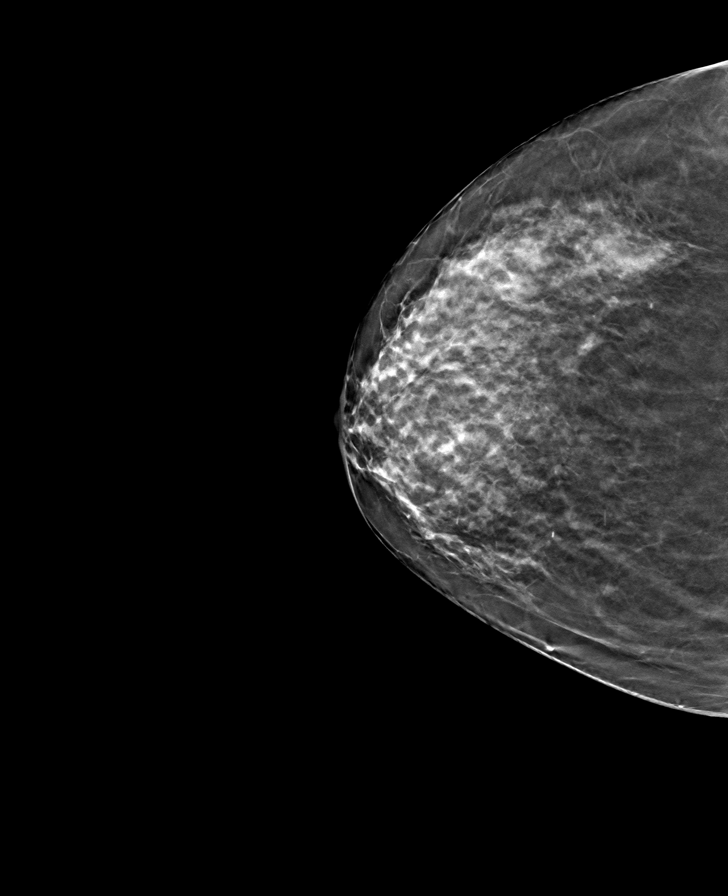

[L MLO tomo · tomo slice 29/58.0]
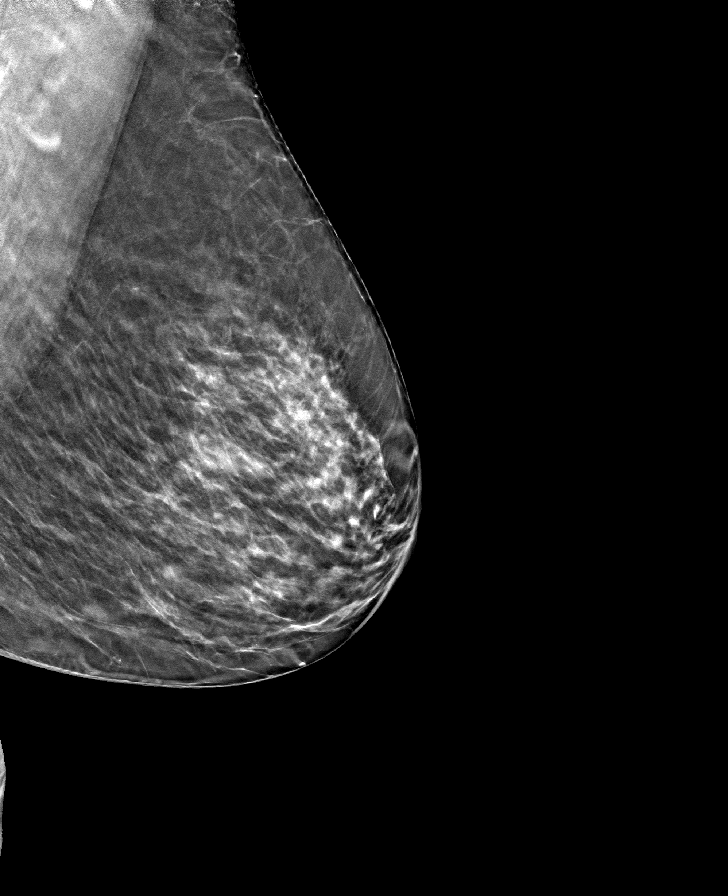

[R MLO tomo · tomo slice 29/58.0]
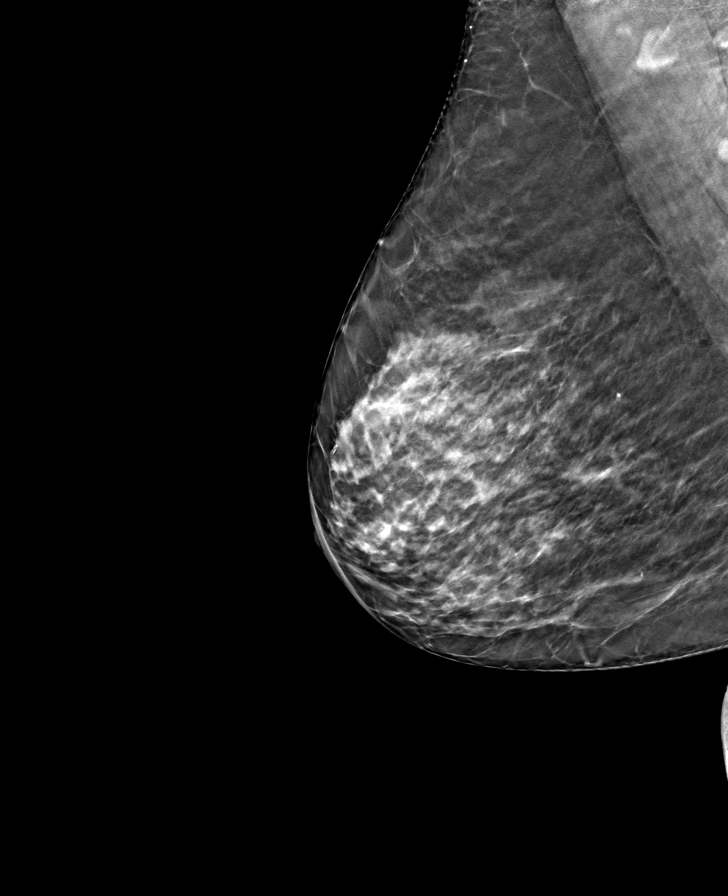

[L CC tomo · tomo slice 31/60.0]
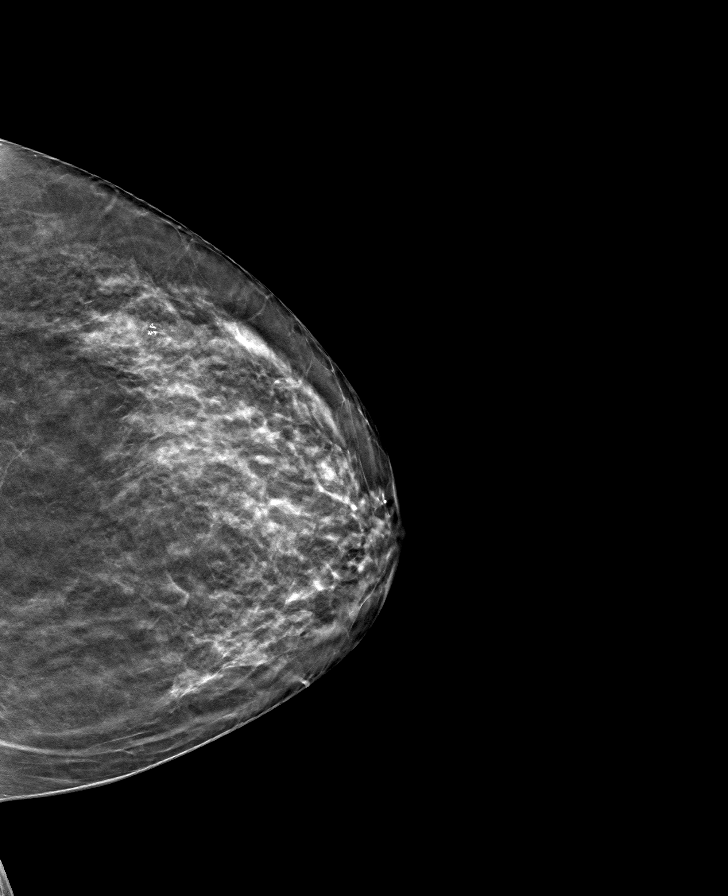

[8 of 24 positions shown; findings below may reference images not displayed]

ACR Breast Density Category c: The breast tissue is heterogeneously
dense, which may obscure small masses.
FINDINGS: There are no findings suspicious for malignancy. Images were
processed with CAD.
IMPRESSION: No mammographic evidence of malignancy. A result letter of this
screening mammogram will be mailed directly to the patient.

RECOMMENDATION:
Screening mammogram in one year. (Code:[5V])

BI-RADS CATEGORY  1: Negative.

## 2019-10-09 DIAGNOSIS — M5416 Radiculopathy, lumbar region: Secondary | ICD-10-CM | POA: Diagnosis not present

## 2019-10-11 ENCOUNTER — Other Ambulatory Visit: Payer: Self-pay

## 2019-10-11 ENCOUNTER — Other Ambulatory Visit: Payer: Self-pay | Admitting: Neurology

## 2019-10-11 MED ORDER — AMPHETAMINE-DEXTROAMPHETAMINE 20 MG PO TABS: 20.0000 mg | ORAL_TABLET | Freq: Two times a day (BID) | ORAL | 0 refills | Status: DC | PRN

## 2019-10-12 DIAGNOSIS — I10 Essential (primary) hypertension: Secondary | ICD-10-CM | POA: Diagnosis not present

## 2019-10-12 DIAGNOSIS — M810 Age-related osteoporosis without current pathological fracture: Secondary | ICD-10-CM | POA: Diagnosis not present

## 2019-10-24 DIAGNOSIS — M5416 Radiculopathy, lumbar region: Secondary | ICD-10-CM | POA: Diagnosis not present

## 2019-11-01 ENCOUNTER — Other Ambulatory Visit: Payer: Self-pay

## 2019-11-01 MED ORDER — FENTANYL 25 MCG/HR TD PT72: MEDICATED_PATCH | TRANSDERMAL | 0 refills | Status: DC

## 2019-11-06 DIAGNOSIS — M19042 Primary osteoarthritis, left hand: Secondary | ICD-10-CM | POA: Diagnosis not present

## 2019-11-06 DIAGNOSIS — M1712 Unilateral primary osteoarthritis, left knee: Secondary | ICD-10-CM | POA: Diagnosis not present

## 2019-11-06 DIAGNOSIS — F325 Major depressive disorder, single episode, in full remission: Secondary | ICD-10-CM | POA: Diagnosis not present

## 2019-11-06 DIAGNOSIS — M19041 Primary osteoarthritis, right hand: Secondary | ICD-10-CM | POA: Diagnosis not present

## 2019-11-07 ENCOUNTER — Other Ambulatory Visit: Payer: Self-pay

## 2019-11-07 MED ORDER — AMPHETAMINE-DEXTROAMPHETAMINE 20 MG PO TABS: 20.0000 mg | ORAL_TABLET | Freq: Two times a day (BID) | ORAL | 0 refills | Status: DC | PRN

## 2019-11-21 DIAGNOSIS — Z1159 Encounter for screening for other viral diseases: Secondary | ICD-10-CM | POA: Diagnosis not present

## 2019-11-26 DIAGNOSIS — K640 First degree hemorrhoids: Secondary | ICD-10-CM | POA: Diagnosis not present

## 2019-11-26 DIAGNOSIS — K5730 Diverticulosis of large intestine without perforation or abscess without bleeding: Secondary | ICD-10-CM | POA: Diagnosis not present

## 2019-11-26 DIAGNOSIS — Z86010 Personal history of colonic polyps: Secondary | ICD-10-CM | POA: Diagnosis not present

## 2019-11-28 ENCOUNTER — Other Ambulatory Visit: Payer: Self-pay

## 2019-11-29 MED ORDER — FENTANYL 25 MCG/HR TD PT72: MEDICATED_PATCH | TRANSDERMAL | 0 refills | Status: DC

## 2019-11-30 DIAGNOSIS — I10 Essential (primary) hypertension: Secondary | ICD-10-CM | POA: Diagnosis not present

## 2019-12-03 ENCOUNTER — Other Ambulatory Visit: Payer: Self-pay | Admitting: *Deleted

## 2019-12-03 MED ORDER — CLONAZEPAM 1 MG PO TABS: 1.0000 mg | ORAL_TABLET | Freq: Three times a day (TID) | ORAL | 1 refills | Status: DC | PRN

## 2019-12-03 NOTE — Progress Notes (Signed)
Meds ordered this encounter  Medications  . clonazePAM (KLONOPIN) 1 MG tablet    Sig: Take 1 tablet (1 mg total) by mouth 3 (three) times daily as needed.    Dispense:  270 tablet    Refill:  1    #270/90 days. Dr. Felecia Shelling out of office. Dr. Leta Baptist covering MD.    Penni Bombard, MD 0/63/0160, 10:93 AM Certified in Neurology, Neurophysiology and Neuroimaging  [facility] [address] [place], [place] [address] [phone]

## 2019-12-07 DIAGNOSIS — H524 Presbyopia: Secondary | ICD-10-CM | POA: Diagnosis not present

## 2019-12-09 ENCOUNTER — Other Ambulatory Visit: Payer: Self-pay

## 2019-12-10 MED ORDER — AMPHETAMINE-DEXTROAMPHETAMINE 20 MG PO TABS: 20.0000 mg | ORAL_TABLET | Freq: Two times a day (BID) | ORAL | 0 refills | Status: DC | PRN

## 2019-12-21 ENCOUNTER — Other Ambulatory Visit: Payer: Self-pay | Admitting: Neurology

## 2019-12-30 ENCOUNTER — Other Ambulatory Visit: Payer: Self-pay | Admitting: Neurology

## 2020-01-01 DIAGNOSIS — G2581 Restless legs syndrome: Secondary | ICD-10-CM | POA: Diagnosis not present

## 2020-01-01 DIAGNOSIS — G35 Multiple sclerosis: Secondary | ICD-10-CM | POA: Diagnosis not present

## 2020-01-01 DIAGNOSIS — N319 Neuromuscular dysfunction of bladder, unspecified: Secondary | ICD-10-CM | POA: Diagnosis not present

## 2020-01-01 DIAGNOSIS — Z0000 Encounter for general adult medical examination without abnormal findings: Secondary | ICD-10-CM | POA: Diagnosis not present

## 2020-01-01 DIAGNOSIS — Z1389 Encounter for screening for other disorder: Secondary | ICD-10-CM | POA: Diagnosis not present

## 2020-01-01 DIAGNOSIS — I10 Essential (primary) hypertension: Secondary | ICD-10-CM | POA: Diagnosis not present

## 2020-01-01 DIAGNOSIS — Z23 Encounter for immunization: Secondary | ICD-10-CM | POA: Diagnosis not present

## 2020-01-01 MED ORDER — FENTANYL 25 MCG/HR TD PT72: MEDICATED_PATCH | TRANSDERMAL | 0 refills | Status: DC

## 2020-01-04 ENCOUNTER — Ambulatory Visit: Payer: Medicare Other | Admitting: Neurology

## 2020-01-04 ENCOUNTER — Encounter: Payer: Self-pay | Admitting: Neurology

## 2020-01-04 VITALS — BP 116/73 | HR 83 | Ht 64.5 in | Wt 129.5 lb

## 2020-01-04 DIAGNOSIS — Z79899 Other long term (current) drug therapy: Secondary | ICD-10-CM

## 2020-01-04 DIAGNOSIS — R208 Other disturbances of skin sensation: Secondary | ICD-10-CM | POA: Diagnosis not present

## 2020-01-04 DIAGNOSIS — G35 Multiple sclerosis: Secondary | ICD-10-CM | POA: Diagnosis not present

## 2020-01-04 DIAGNOSIS — Z79891 Long term (current) use of opiate analgesic: Secondary | ICD-10-CM

## 2020-01-04 DIAGNOSIS — R260 Ataxic gait: Secondary | ICD-10-CM | POA: Diagnosis not present

## 2020-01-04 DIAGNOSIS — M5432 Sciatica, left side: Secondary | ICD-10-CM

## 2020-01-04 MED ORDER — AMPHETAMINE-DEXTROAMPHETAMINE 20 MG PO TABS
ORAL_TABLET | ORAL | 0 refills | Status: DC
Start: 2020-01-04 — End: 2020-02-04

## 2020-01-04 NOTE — Progress Notes (Signed)
[facility]  PATIENT: Sheri Burns DOB: 1955/03/20  REFERRING CLINICIAN: Thressa Sheller  HISTORY FROM: Patient REASON FOR VISIT: MS   HISTORICAL  CHIEF COMPLAINT:  Chief Complaint  Patient presents with  . Follow-up    Rm 13, MS ARAVA (last seen 08-27-19). she now has hypertension.    HISTORY OF PRESENT ILLNESS:  Sheri Burns is a 64 year old woman with MS.     Update 10/29//2021: She feels her MS is stable.   She is on leflunomide and tolerates it well.   She denies difficulty with gait, strength, sensation.   Vision is fine.    Bladder has some urinary urgency and mild stress incontinence helped by oxybutynin    She feels fatigue is better.   She is sleeping better.     Mood is doing ok and she has les depression.  She stopped Abilify.   She has much less stress and anxiety (husband passed but had multiple medical issues including dementia)  She has an [facility] August 2021 with benefit.   She exercises and does exercises learned in PT.    She sees [facility].    Recent MRI showed multilevel DJD.     She started treatment for HTN.      L-spine MRI ([facility]).    It does show significant degenerative changes at several levels.  At L4-L5 there appears to be a disc herniation towards the left that could be compressing the left L5 nerve root.  At L3-L4, there are changes more to the right that could affect the right L4 nerve root.  There are also degenerative changes at L1-L2 and L2-L3 though there does not appear to be nerve root compression at those levels.  MRI brain 01/04/2020 shows multiple T2/FLAIR hyperintense foci in the hemispheres with a few punctate foci in the pons and cerebral hemispheres. The pattern is consistent with chronic demyelinating plaque associated with multiple sclerosis. None of the foci appears to be acute. Compared to the MRI dated 08/31/2016, there are no new lesions  She had her Covid-19 vaccinations in January 2021 and  will be getting the booster tomorrow    MS History:   She was diagnosed with multiple sclerosis more than 20 years ago. Initially, she was treated with Betaseron then Copaxone but has not been on any disease modifying therapy for about 8 years. During this time, she has had occasional  Exacerbations with more fatigue and gait issues.  She also has noted mild cognitive dysfunction worsening over the past 5 or 6 years. Her MRI of the brain performed 08/31/2012 shows foci that are predominantly periventricular. They are consistent with the diagnosis of multiple sclerosis. When this MRI was compared to an MRI dated 10/12/2010, there was one additional small periventricular focus. There were no acute findings on either MRI.   In November 2015, she had an exacerbation with severe fatigue, worsened gait and dysphagia.    She started Aubagio 09/09/14.      IMAGING L-spine MRI ([facility]).    It does show significant degenerative changes at several levels.  At L4-L5 there appears to be a disc herniation towards the left that could be compressing the left L5 nerve root.  At L3-L4, there are changes more to the right that could affect the right L4 nerve root.  There are also degenerative changes at L1-L2 and L2-L3 though there does not appear to be nerve root compression at those levels.  MRI brain 01/04/2020 shows multiple T2/FLAIR  hyperintense foci in the hemispheres with a few punctate foci in the pons and cerebral hemispheres. The pattern is consistent with chronic demyelinating plaque associated with multiple sclerosis. None of the foci appears to be acute. Compared to the MRI dated 08/31/2016, there are no new lesions.  MRI 08/31/2016 showed multiple lesions in the hemispheres and pons but no new lesions compared to 10/31/2014.  MRI 10/31/2014 showed periventricular and deep white matter T2/flair hyperintense foci in a pattern and configuration consistent with multiple sclerosis. None of these foci enhanced  after contrast administration.  There were no new lesions compared to 08/30/2012 MRI  OTHER  NCV/EMG 10/18/2017 1.    Moderately severe right median neuropathy at the wrist (carpal tunnel syndrome).   Chronic denervation changes were noted in the APB muscle. 2.    Borderline left median neuropathy at the wrist. 3.    There is no evidence of a significant cervical radiculopathy on the right. REVIEW OF SYSTEMS:  Constitutional: No fevers, chills, sweats, or change in appetite.  She notes a lot of fatigue. She is sleeping well. Eyes: No visual changes, double vision, eye pain Ear, nose and throat: No hearing loss, ear pain, nasal congestion, sore throat Cardiovascular: No chest pain, palpitations Respiratory:  No shortness of breath at rest or with exertion.   No wheezes GastrointestinaI: No nausea, vomiting, diarrhea.  Has constipation Genitourinary:  see above. Musculoskeletal:  reports pain in knees.   Reports neck pain and back pain Integumentary: No rash, pruritus, skin lesions Neurological: as above Psychiatric: Depression and  Anxiety noted Endocrine: No palpitations, diaphoresis, change in appetite, change in weigh or increased thirst Hematologic/Lymphatic:  No anemia, purpura, petechiae. Allergic/Immunologic: No itchy/runny eyes, nasal congestion, recent allergic reactions, rashes  ALLERGIES: Allergies  Allergen Reactions  . Amoxicillin Hives and Itching  . Codeine Nausea And Vomiting    vomiting  . Demerol [Meperidine] Nausea And Vomiting  . Ivp Dye [Iodinated Diagnostic Agents] Hives  . Penicillins Hives  . Stadol [Butorphanol] Other (See Comments)    Per patient psychotic event  . Sulfonamide Derivatives Hives  . Clindamycin/Lincomycin Rash  . Gadolinium Derivatives Hives    HOME MEDICATIONS: Outpatient Medications Prior to Visit  Medication Sig Dispense Refill  . acetaminophen (TYLENOL) 500 MG tablet Take 500 mg by mouth 4 (four) times daily.    Marland Kitchen  amLODipine-valsartan (EXFORGE) 5-160 MG tablet Take 1 tablet by mouth daily.    . Calcium Carb-Cholecalciferol (CALCIUM 600 + D PO) Take 1 tablet by mouth daily.    . clonazePAM (KLONOPIN) 1 MG tablet Take 1 tablet (1 mg total) by mouth 3 (three) times daily as needed. 270 tablet 1  . denosumab (PROLIA) 60 MG/ML SOSY injection Inject 60 mg into the skin every 6 (six) months. 1 each 1  . DULoxetine (CYMBALTA) 60 MG capsule Take 1 capsule (60 mg total) by mouth daily. 90 capsule 3  . fentaNYL (DURAGESIC) 25 MCG/HR Place 1 patch (25 mcg total) onto the skin every 3 (three) days. 10 patch 0  . gabapentin (NEURONTIN) 600 MG tablet TAKE 1 TABLET 4 TIMES A DAY 360 tablet 3  . leflunomide (ARAVA) 20 MG tablet ONE WHOLE TABLET DAILY BY MOUTH 90 tablet 3  . Multiple Vitamin (MULTIVITAMIN) tablet Take 1 tablet by mouth daily.    Marland Kitchen oxybutynin (DITROPAN-XL) 10 MG 24 hr tablet Take 1 tablet (10 mg total) by mouth daily. 90 tablet 3  . traMADol (ULTRAM) 50 MG tablet TAKE 1 TABLET EVERY SIX HOURS  AS NEEDED (8 AM, 12 NOON, 4 PM AND 8 PM) 120 tablet 5  . amphetamine-dextroamphetamine (ADDERALL) 20 MG tablet Take 1 tablet (20 mg total) by mouth 2 (two) times daily as needed. 60 tablet 0  . ARIPiprazole (ABILIFY) 2 MG tablet TAKE 1 TABLET BY MOUTH EVERY DAY 30 tablet 5   No facility-administered medications prior to visit.    PAST MEDICAL HISTORY: Past Medical History:  Diagnosis Date  . Anxiety   . Bulging lumbar disc 1980  . Chronic pansinusitis   . Colon polyp   . Depression   . GERD (gastroesophageal reflux disease)   . Gynecological examination    sees Dr. Elyse Hsu   . Headache(784.0)   . Hypertension   . Multiple sclerosis ([place]) since 1992   Dr. Arlice Colt in [facility]  . Multiple thyroid nodules 1995   per pt. resolved on its own follwed by endo then released  . Neurogenic bladder disorder 12/2009   w/ urge & stress inc. dx. Dr. McDiarmid  . Neuropathy   . Osteoarthritis   .  Osteoporosis    last DEXA 2018  . Overactive bladder    sees Dr. Nicki Reaper McDiarmid   . Pancreatitis 1977  . Ulcerative proctitis ([place])   . Vision abnormalities     PAST SURGICAL HISTORY: Past Surgical History:  Procedure Laterality Date  . APPENDECTOMY    . breast ruptured  left   ductal cyst  . CARPAL TUNNEL RELEASE Right   . CHOLECYSTECTOMY    . COLONOSCOPY  01/10/09   repeat in 5 yrs Dr. Wilford Corner  . DILATION AND CURETTAGE OF UTERUS    . LAPAROSCOPY     x 3  . left knee surgery  1978   to remove bone tumor    . NASAL SINUS SURGERY Bilateral 01/13/2015   Procedure: ENDOSCOPIC SINUS SURGERY;  Surgeon: Izora Gala, MD;  Location: [facility];  Service: ENT;  Laterality: Bilateral;  . NASAL SINUS SURGERY  05/2017  . NASAL TURBINATE REDUCTION    . pubo vaginal sling  10/2003  . TONSILLECTOMY    . vaginal cystocele repair     anterior  . VAGINAL HYSTERECTOMY  1995   AUB & Endometriosis  . widom teeth removal      FAMILY HISTORY: Family History  Problem Relation Age of Onset  . Osteoporosis Mother        severe  . Cancer Mother 32       colon /Breast also m. niece at 64's  . Breast cancer Mother   . Osteopenia Sister        X 2  . Breast cancer Other     SOCIAL HISTORY:  Social History   Socioeconomic History  . Marital status: Married    Spouse name: Jori Moll  . Number of children: 1  . Years of education: 57  . Highest education level: Not on file  Occupational History  . Occupation: Nurse, learning disability: UNEMPLOYED  . Occupation: [employer], occupational  Tobacco Use  . Smoking status: Former Smoker    Packs/day: 1.00    Years: 5.00    Pack years: 5.00    Types: Cigarettes    Quit date: 03/09/1979    Years since quitting: 40.8  . Smokeless tobacco: Never Used  Vaping Use  . Vaping Use: Never used  Substance and Sexual Activity  . Alcohol use: No  . Drug use: No  . Sexual activity: Not Currently  Partners: Male    Birth  control/protection: Surgical  Other Topics Concern  . Not on file  Social History Narrative  . Not on file   Social Determinants of Health   Financial Resource Strain:   . Difficulty of Paying Living Expenses: Not on file  Food Insecurity:   . Worried About Charity fundraiser in the Last Year: Not on file  . Ran Out of Food in the Last Year: Not on file  Transportation Needs:   . Lack of Transportation (Medical): Not on file  . Lack of Transportation (Non-Medical): Not on file  Physical Activity:   . Days of Exercise per Week: Not on file  . Minutes of Exercise per Session: Not on file  Stress:   . Feeling of Stress : Not on file  Social Connections:   . Frequency of Communication with Friends and Family: Not on file  . Frequency of Social Gatherings with Friends and Family: Not on file  . Attends Religious Services: Not on file  . Active Member of Clubs or Organizations: Not on file  . Attends Archivist Meetings: Not on file  . Marital Status: Not on file  Intimate Partner Violence:   . Fear of Andrzejewski or Ex-Partner: Not on file  . Emotionally Abused: Not on file  . Physically Abused: Not on file  . Sexually Abused: Not on file     PHYSICAL EXAM  Vitals:   01/04/20 1045  BP: 116/73  Pulse: 83  Weight: 129 lb 8 oz (58.7 kg)  Height: 5' 4.5" (1.638 m)    Body mass index is 21.89 kg/m.   General: The patient is well-developed and well-nourished and in no acute distress.     Neurologic Exam  Mental status: The patient is alert and oriented x 3 at the time of the examination. The patient has apparent normal recent and remote memory, with an apparently normal attention span and concentration ability.   Speech is normal.  Cranial nerves: Extraocular movements are full.  Facial strength and sensation is normal.  Trapezius strength is strong..  Motor:  Muscle bulk is normal and tone is increased in legs. Strength is  5 / 5  Sensory: She has numbness and  allodynia in the left L5 distribution.  .   Coordination: Cerebellar testing showed good heel to shin but and finger to nose  Gait and station: Station is stable.  The gait is slightly wide and arthritic.  Tandem gait is mildly wide.. .  Romberg sign is negative.   Reflexes: Deep tendon reflexes are symmetric and fairly normal in the arms. However, she has increased reflexes in the legs with spread at the knees but no clonus in the ankles...    ______________________________   Multiple sclerosis ([place])  Sciatica of left side  Dysesthesia  Ataxic gait  High risk medication use  Chronic prescription opiate use  1.  Continue leflunomide for MS.  She has had recent blood work with primary care.  2.   Back pain is doing better.  She will reduce the Robaxin.  If she continues to improve, consider changing fentanyl to a as needed medication. 3.   Try to stay active and exercise as tolerated 4.    She is on fentanyl patches and a prn benzodiazepine for pain and muscle spasticity and Adderall for attention deficit from her MS..   The [place] controlled substance database has been reviewed.  She has been compliant and does not  get medication from other doctors.  There is no escalation.   5.    Return to see [place] in 4 months or sooner for new or worsening neurologic symptoms    Sheri Sachdev A. Felecia Shelling, MD, PhD 43/83/7793, 96:88 PM Certified in Neurology, Clinical Neurophysiology, Sleep Medicine, Pain Medicine and Neuroimaging  [facility] [address], [place], [place] [address] [phone]

## 2020-01-09 DIAGNOSIS — Z711 Person with feared health complaint in whom no diagnosis is made: Secondary | ICD-10-CM | POA: Diagnosis not present

## 2020-01-16 ENCOUNTER — Ambulatory Visit: Payer: Medicare Other | Admitting: Neurology

## 2020-01-18 ENCOUNTER — Other Ambulatory Visit: Payer: Self-pay | Admitting: Neurology

## 2020-01-21 ENCOUNTER — Other Ambulatory Visit: Payer: Self-pay | Admitting: Neurology

## 2020-01-21 ENCOUNTER — Other Ambulatory Visit: Payer: Self-pay | Admitting: *Deleted

## 2020-01-21 MED ORDER — TRAMADOL HCL 50 MG PO TABS
ORAL_TABLET | ORAL | 5 refills | Status: DC
Start: 2020-01-21 — End: 2020-07-21

## 2020-01-29 ENCOUNTER — Other Ambulatory Visit: Payer: Self-pay

## 2020-01-29 MED ORDER — FENTANYL 25 MCG/HR TD PT72
MEDICATED_PATCH | TRANSDERMAL | 0 refills | Status: DC
Start: 2020-01-29 — End: 2020-02-25

## 2020-02-03 ENCOUNTER — Other Ambulatory Visit: Payer: Self-pay

## 2020-02-04 ENCOUNTER — Other Ambulatory Visit: Payer: Self-pay | Admitting: *Deleted

## 2020-02-04 MED ORDER — AMPHETAMINE-DEXTROAMPHETAMINE 20 MG PO TABS: ORAL_TABLET | ORAL | 0 refills | Status: DC

## 2020-02-24 DIAGNOSIS — M19041 Primary osteoarthritis, right hand: Secondary | ICD-10-CM | POA: Diagnosis not present

## 2020-02-24 DIAGNOSIS — M1712 Unilateral primary osteoarthritis, left knee: Secondary | ICD-10-CM | POA: Diagnosis not present

## 2020-02-24 DIAGNOSIS — F325 Major depressive disorder, single episode, in full remission: Secondary | ICD-10-CM | POA: Diagnosis not present

## 2020-02-24 DIAGNOSIS — M19042 Primary osteoarthritis, left hand: Secondary | ICD-10-CM | POA: Diagnosis not present

## 2020-02-25 ENCOUNTER — Other Ambulatory Visit: Payer: Self-pay

## 2020-02-25 MED ORDER — FENTANYL 25 MCG/HR TD PT72: MEDICATED_PATCH | TRANSDERMAL | 0 refills | Status: DC

## 2020-03-04 ENCOUNTER — Other Ambulatory Visit: Payer: Self-pay | Admitting: Diagnostic Neuroimaging

## 2020-03-04 ENCOUNTER — Other Ambulatory Visit: Payer: Self-pay

## 2020-03-04 MED ORDER — AMPHETAMINE-DEXTROAMPHETAMINE 20 MG PO TABS: ORAL_TABLET | ORAL | 0 refills | Status: DC

## 2020-03-29 ENCOUNTER — Other Ambulatory Visit: Payer: Self-pay

## 2020-03-31 MED ORDER — FENTANYL 25 MCG/HR TD PT72: MEDICATED_PATCH | TRANSDERMAL | 0 refills | Status: DC

## 2020-04-04 ENCOUNTER — Other Ambulatory Visit: Payer: Self-pay

## 2020-04-07 DIAGNOSIS — M19042 Primary osteoarthritis, left hand: Secondary | ICD-10-CM | POA: Diagnosis not present

## 2020-04-07 DIAGNOSIS — F325 Major depressive disorder, single episode, in full remission: Secondary | ICD-10-CM | POA: Diagnosis not present

## 2020-04-07 DIAGNOSIS — M19041 Primary osteoarthritis, right hand: Secondary | ICD-10-CM | POA: Diagnosis not present

## 2020-04-07 DIAGNOSIS — M1712 Unilateral primary osteoarthritis, left knee: Secondary | ICD-10-CM | POA: Diagnosis not present

## 2020-04-07 MED ORDER — AMPHETAMINE-DEXTROAMPHETAMINE 20 MG PO TABS: ORAL_TABLET | ORAL | 0 refills | Status: DC

## 2020-04-22 ENCOUNTER — Other Ambulatory Visit: Payer: Self-pay | Admitting: Neurology

## 2020-04-28 ENCOUNTER — Other Ambulatory Visit: Payer: Self-pay

## 2020-04-28 MED ORDER — FENTANYL 25 MCG/HR TD PT72: MEDICATED_PATCH | TRANSDERMAL | 0 refills | Status: DC

## 2020-04-29 DIAGNOSIS — M79642 Pain in left hand: Secondary | ICD-10-CM | POA: Diagnosis not present

## 2020-04-30 ENCOUNTER — Other Ambulatory Visit: Payer: Self-pay | Admitting: Neurology

## 2020-04-30 DIAGNOSIS — G35 Multiple sclerosis: Secondary | ICD-10-CM

## 2020-05-02 ENCOUNTER — Other Ambulatory Visit: Payer: Self-pay

## 2020-05-02 DIAGNOSIS — M810 Age-related osteoporosis without current pathological fracture: Secondary | ICD-10-CM | POA: Diagnosis not present

## 2020-05-05 ENCOUNTER — Encounter: Payer: Self-pay | Admitting: Family Medicine

## 2020-05-05 ENCOUNTER — Ambulatory Visit: Payer: Medicare Other | Admitting: Family Medicine

## 2020-05-05 VITALS — BP 125/70 | HR 100 | Ht 65.0 in | Wt 134.0 lb

## 2020-05-05 DIAGNOSIS — E559 Vitamin D deficiency, unspecified: Secondary | ICD-10-CM | POA: Diagnosis not present

## 2020-05-05 DIAGNOSIS — Z79899 Other long term (current) drug therapy: Secondary | ICD-10-CM | POA: Diagnosis not present

## 2020-05-05 DIAGNOSIS — R5382 Chronic fatigue, unspecified: Secondary | ICD-10-CM | POA: Diagnosis not present

## 2020-05-05 DIAGNOSIS — Z79891 Long term (current) use of opiate analgesic: Secondary | ICD-10-CM

## 2020-05-05 DIAGNOSIS — M5432 Sciatica, left side: Secondary | ICD-10-CM

## 2020-05-05 DIAGNOSIS — G35D Multiple sclerosis, unspecified: Secondary | ICD-10-CM

## 2020-05-05 DIAGNOSIS — F418 Other specified anxiety disorders: Secondary | ICD-10-CM

## 2020-05-05 DIAGNOSIS — G35 Multiple sclerosis: Secondary | ICD-10-CM

## 2020-05-05 DIAGNOSIS — R208 Other disturbances of skin sensation: Secondary | ICD-10-CM

## 2020-05-05 MED ORDER — SCOPOLAMINE 1 MG/3DAYS TD PT72: 1.0000 | MEDICATED_PATCH | TRANSDERMAL | 0 refills | Status: DC

## 2020-05-05 MED ORDER — AMPHETAMINE-DEXTROAMPHETAMINE 20 MG PO TABS: ORAL_TABLET | ORAL | 0 refills | Status: DC

## 2020-05-05 NOTE — Patient Instructions (Signed)
Below is our plan:  We will continue [place] treatment plan   Please make sure you are staying well hydrated. I recommend 50-60 ounces daily. Well balanced diet and regular exercise encouraged. Consistent sleep schedule with 6-8 hours recommended.   Please continue follow up with care team as directed.   Follow up with Dr Felecia Shelling in 4 months   You may receive a survey regarding today's visit. I encourage you to leave honest feed back as I do use this information to improve patient care. Thank you for seeing me today!      Multiple Sclerosis Multiple sclerosis (MS) is a disease of the brain, spinal cord, and optic nerves (central nervous system). It causes the body's disease-fighting (immune) system to destroy the protective covering (myelin sheath) around nerves in the brain. When this happens, signals (nerve impulses) going to and from the brain and spinal cord do not get sent properly or may not get sent at all. There are several types of MS:  Relapsing-remitting MS. This is the most common type. This causes sudden attacks of symptoms. After an attack, you may recover completely until the next attack, or some symptoms may remain permanently.  Secondary progressive MS. This usually develops after the onset of relapsing-remitting MS. Similar to relapsing-remitting MS, this type also causes sudden attacks of symptoms. Attacks may be less frequent, but symptoms slowly get worse (progress) over time.  Primary progressive MS. This causes symptoms that steadily progress over time. This type of MS does not cause sudden attacks of symptoms. The age of onset of MS varies, but it often develops between 56-61 years of age. MS is a lifelong (chronic) condition. There is no cure, but treatment can help slow down the progression of the disease. What are the causes? The cause of this condition is not known. What increases the risk? You are more likely to develop this condition if:  You are a  woman.  You have a relative with MS. However, the condition is not passed from parent to child (inherited).  You have a lack (deficiency) of vitamin D.  You smoke. MS is more common in the [place] than in the [place]. What are the signs or symptoms? Relapsing-remitting and secondary progressive MS cause symptoms to occur in episodes or attacks that may last weeks to months. There may be long periods between attacks in which there are almost no symptoms. Primary progressive MS causes symptoms to steadily progress after they develop. Symptoms of MS vary because of the many different ways it affects the central nervous system. The main symptoms include:  Vision problems and eye pain.  Numbness and weakness.  Inability to move your arms, hands, feet, or legs (paralysis).  Balance problems.  Shaking that you cannot control (tremors).  Muscle spasms.  Problems with thinking (cognitive changes). MS can also cause symptoms that are associated with the disease, but are not always the direct result of an MS attack. They may include:  Inability to control urination or bowel movements (incontinence).  Headaches.  Fatigue.  Inability to tolerate heat.  Emotional changes.  Depression.  Pain. How is this diagnosed? This condition is diagnosed based on:  Your symptoms.  A neurological exam. This involves checking central nervous system function, such as nerve function, reflexes, and coordination.  MRIs of the brain and spinal cord.  Lab tests, including a lumbar puncture that tests the fluid that surrounds the brain and spinal cord (cerebrospinal fluid).  Tests to measure  the electrical activity of the brain in response to stimulation (evoked potentials). How is this treated? There is no cure for MS, but medicines can help decrease the number and frequency of attacks and help relieve nuisance symptoms. Treatment options may include:  Medicines that  reduce the frequency of attacks. These medicines may be given by injection, by mouth (orally), or through an IV.  Medicines that reduce inflammation (steroids). These may provide short-term relief of symptoms.  Medicines to help control pain, depression, fatigue, or incontinence.  Nutritional counseling. Vitamin D supplements, if you have a deficiency.  Using devices to help you move around (assistive devices), such as braces, a cane, or a walker.  Physical therapy to strengthen and stretch your muscles.  Occupational therapy to help you with everyday tasks.  Alternative or complementary treatments such as exercise, massage, or acupuncture.   Follow these instructions at home:  Take over-the-counter and prescription medicines only as told by your health care provider.  Do not drive or use heavy machinery while taking prescription pain medicine.  Use assistive devices as recommended by your physical therapist or your health care provider.  Exercise as directed by your health care provider.  Eating healthy can help manage MS symptoms.  Return to your normal activities as told by your health care provider. Ask your health care provider what activities are safe for you.  Reach out for support. Share your feelings with friends, family, or a support group.  Keep all follow-up visits as told by your health care provider and therapists. This is important. Where to find more information  National Multiple Sclerosis Society: https://www.nationalmssociety.org  National Institute of Neurological Disorders and Stroke: https://www.ninds.nih.gov  National Center for Complementary and Integrative Health: https://www.nccih.nih.gov/ Contact a health care provider if:  You feel depressed.  You develop new pain or numbness.  You have tremors.  You have problems with sexual function. Get help right away if:  You develop paralysis.  You develop numbness.  You have problems with your  bladder or bowel function.  You develop double vision.  You lose vision in one or both eyes.  You develop suicidal thoughts.  You develop severe confusion. If you ever feel like you may hurt yourself or others, or have thoughts about taking your own life, get help right away. You can go to your nearest emergency department or call:  Your local emergency services (911 in the [place]  A suicide crisis helpline, such as the National Suicide Prevention Lifeline at 1-800-273-8255. This is open 24 hours a day. Summary  Multiple sclerosis (MS) is a disease of the central nervous system that causes the body's immune system to destroy the protective covering (myelin sheath) around nerves in the brain.  There are 3 types of MS: relapsing-remitting, secondary progressive, and primary progressive. Relapsing-remitting and secondary progressive MS cause symptoms to occur in episodes or attacks that may last weeks to months. Primary progressive MS causes symptoms to steadily progress after they develop.  There is no cure for MS, but medicines can help decrease the number and frequency of attacks and help relieve nuisance symptoms. Treatment may also include physical or occupational therapy.  If you develop numbness, paralysis, vision problems, or other neurological symptoms, get help right away. This information is not intended to replace advice given to you by your health care provider. Make sure you discuss any questions you have with your health care provider. Document Revised: 12/04/2019 Document Reviewed: 12/04/2019 Elsevier Patient Education  2021 Elsevier Inc.  

## 2020-05-05 NOTE — Progress Notes (Signed)
Chief Complaint  Patient presents with  . Follow-up    RM 2  Pt is well, things are stable      HISTORY OF PRESENT ILLNESS: 05/07/20 ALL:   Sheri Burns is a 65 y.o. female here today for follow up for RRMS. She continues [place]. Labs have been stable. MRI brain 09/2019 stable.   She is doing well. No new or exacerbating symptoms. She does have chronic left leg pain. She is on Fentanyl 48mcg every 72 hours, gabapentin 600mg  four times daily and tramadol up to four times daily. She feels pain is well managed. She is tolerating medications well with no adverse effects.   Alprazolam three times daily for anxiety. Cymbalta helps with depression and dysesthesias. She weaned Ability as she did not feel it was needed. She is feeling good.   She is going on a cruise in two weeks. She is wanting to use scopolamine for motion sickness. She has taken in the past with no adverse effects.    HISTORY (copied from Dr Garth Bigness previous note)  Sheri Burns is a 65 year old woman with MS.     Update 10/29//2021: She feels her MS is stable.   She is on leflunomide and tolerates it well.   She denies difficulty with gait, strength, sensation.   Vision is fine.    Bladder has some urinary urgency and mild stress incontinence helped by oxybutynin    She feels fatigue is better.   She is sleeping better.     Mood is doing ok and she has les depression.  She stopped Abilify.   She has much less stress and anxiety (husband passed but had multiple medical issues including dementia)  She has an [facility] August 2021 with benefit.   She exercises and does exercises learned in PT.    She sees [facility].    Recent MRI showed multilevel DJD.     She started treatment for HTN.      L-spine MRI ([facility]).    It does show significant degenerative changes at several levels.At L4-L5there appears to be a disc herniation towards the left that could be compressing the left L5 nerve root.At L3-L4,  there are changes more to the right that could affect the right L4 nerve root. There are also degenerative changes at L1-L2 and L2-L3though there does not appear to be nerve root compression at those levels.  MRI brain 01/04/2020 shows multiple T2/FLAIR hyperintense foci in the hemispheres with a few punctate foci in the pons and cerebral hemispheres. The pattern is consistent with chronic demyelinating plaque associated with multiple sclerosis. None of the foci appears to be acute. Compared to the MRI dated 08/31/2016, there are no new lesions  She had her Covid-19 vaccinations in January 2021 and will be getting the booster tomorrow    MS History:   She was diagnosed with multiple sclerosis more than 20 years ago. Initially, she was treated with Betaseron then Copaxone but has not been on any disease modifying therapy for about 8 years. During this time, she has had occasional  Exacerbations with more fatigue and gait issues.  She also has noted mild cognitive dysfunction worsening over the past 5 or 6 years. Her MRI of the brain performed 08/31/2012 shows foci that are predominantly periventricular. They are consistent with the diagnosis of multiple sclerosis. When this MRI was compared to an MRI dated 10/12/2010, there was one additional small periventricular focus. There were no acute findings on either  MRI.   In November 2015, she had an exacerbation with severe fatigue, worsened gait and dysphagia.    She started Aubagio 09/09/14.      IMAGING L-spine MRI ([facility]).    It does show significant degenerative changes at several levels.At L4-L5there appears to be a disc herniation towards the left that could be compressing the left L5 nerve root.At L3-L4, there are changes more to the right that could affect the right L4 nerve root. There are also degenerative changes at L1-L2 and L2-L3though there does not appear to be nerve root compression at those levels.  MRI brain 01/04/2020  shows multiple T2/FLAIR hyperintense foci in the hemispheres with a few punctate foci in the pons and cerebral hemispheres. The pattern is consistent with chronic demyelinating plaque associated with multiple sclerosis. None of the foci appears to be acute. Compared to the MRI dated 08/31/2016, there are no new lesions.  MRI 08/31/2016 showed multiple lesions in the hemispheres and pons but no new lesions compared to 10/31/2014.  MRI 10/31/2014 showed periventricular and deep white matter T2/flair hyperintense foci in a pattern and configuration consistent with multiple sclerosis. None of these foci enhanced after contrast administration.  There were no new lesions compared to 08/30/2012 MRI  OTHER  NCV/EMG 10/18/2017 1.    Moderately severe right median neuropathy at the wrist (carpal tunnel syndrome).   Chronic denervation changes were noted in the APB muscle. 2.    Borderline left median neuropathy at the wrist. 3.    There is no evidence of a significant cervical radiculopathy on the right.     REVIEW OF SYSTEMS: Out of a complete 14 system review of symptoms, the patient complains only of the following symptoms, chronic pain, numbness, tingling, anxiety  and all other reviewed systems are negative.    ALLERGIES: Allergies  Allergen Reactions  . Amoxicillin Hives and Itching  . Codeine Nausea And Vomiting    vomiting  . Demerol [Meperidine] Nausea And Vomiting  . Ivp Dye [Iodinated Diagnostic Agents] Hives  . Penicillins Hives  . Stadol [Butorphanol] Other (See Comments)    Per patient psychotic event  . Sulfonamide Derivatives Hives  . Clindamycin/Lincomycin Rash  . Gadolinium Derivatives Hives     HOME MEDICATIONS: Outpatient Medications Prior to Visit  Medication Sig Dispense Refill  . acetaminophen (TYLENOL) 500 MG tablet Take 500 mg by mouth 4 (four) times daily.    Marland Kitchen amLODipine-valsartan (EXFORGE) 5-160 MG tablet Take 1 tablet by mouth daily.    Marland Kitchen  amphetamine-dextroamphetamine (ADDERALL) 20 MG tablet Take one pill twice a day 60 tablet 0  . Calcium Carb-Cholecalciferol (CALCIUM 600 + D PO) Take 1 tablet by mouth daily.    . clonazePAM (KLONOPIN) 1 MG tablet Take 1 tablet (1 mg total) by mouth 3 (three) times daily as needed. 270 tablet 1  . denosumab (PROLIA) 60 MG/ML SOSY injection Inject 60 mg into the skin every 6 (six) months. 1 each 1  . DULoxetine (CYMBALTA) 60 MG capsule Take 1 capsule (60 mg total) by mouth daily. 90 capsule 3  . fentaNYL (DURAGESIC) 25 MCG/HR Place 1 patch (25 mcg total) onto the skin every 3 (three) days. 10 patch 0  . gabapentin (NEURONTIN) 600 MG tablet TAKE 1 TABLET BY MOUTH 4 TIMES DAILY 360 tablet 3  . leflunomide (ARAVA) 20 MG tablet TAKE 1 TABLET BY MOUTH EVERY DAY 90 tablet 3  . Multiple Vitamin (MULTIVITAMIN) tablet Take 1 tablet by mouth daily.    Marland Kitchen  oxybutynin (DITROPAN-XL) 10 MG 24 hr tablet Take 1 tablet (10 mg total) by mouth daily. 90 tablet 3  . traMADol (ULTRAM) 50 MG tablet TAKE 1 TABLET EVERY SIX HOURS AS NEEDED (8 AM, 12 NOON, 4 PM AND 8 PM) 120 tablet 5  . traMADol (ULTRAM) 50 MG tablet TAKE 1 TABLET EVERY SIX HOURS AS NEEDED (8 AM, 12 NOON, 4 PM AND 8 PM) 120 tablet 5   No facility-administered medications prior to visit.     PAST MEDICAL HISTORY: Past Medical History:  Diagnosis Date  . Anxiety   . Bulging lumbar disc 1980  . Chronic pansinusitis   . Colon polyp   . Depression   . GERD (gastroesophageal reflux disease)   . Gynecological examination    sees Dr. Elyse Hsu   . Headache(784.0)   . Hypertension   . Multiple sclerosis ([place]) since 1992   Dr. Arlice Colt in [facility]  . Multiple thyroid nodules 1995   per pt. resolved on its own follwed by endo then released  . Neurogenic bladder disorder 12/2009   w/ [facility] dx. Dr. McDiarmid  . Neuropathy   . Osteoarthritis   . Osteoporosis    last DEXA 2018  . Overactive bladder    sees Dr. Nicki Reaper McDiarmid    . Pancreatitis 1977  . Ulcerative proctitis ([place])   . Vision abnormalities      PAST SURGICAL HISTORY: Past Surgical History:  Procedure Laterality Date  . APPENDECTOMY    . breast ruptured  left   ductal cyst  . CARPAL TUNNEL RELEASE Right   . CHOLECYSTECTOMY    . COLONOSCOPY  01/10/09   repeat in 5 yrs Dr. Wilford Corner  . DILATION AND CURETTAGE OF UTERUS    . LAPAROSCOPY     x 3  . left knee surgery  1978   to remove bone tumor    . NASAL SINUS SURGERY Bilateral 01/13/2015   Procedure: ENDOSCOPIC SINUS SURGERY;  Surgeon: Izora Gala, MD;  Location: [place];  Service: ENT;  Laterality: Bilateral;  . NASAL SINUS SURGERY  05/2017  . NASAL TURBINATE REDUCTION    . pubo vaginal sling  10/2003  . TONSILLECTOMY    . vaginal cystocele repair     anterior  . VAGINAL HYSTERECTOMY  1995   AUB & Endometriosis  . widom teeth removal       FAMILY HISTORY: Family History  Problem Relation Age of Onset  . Osteoporosis Mother        severe  . Cancer Mother 9       colon /Breast also m. niece at 35's  . Breast cancer Mother   . Osteopenia Sister        X 2  . Breast cancer Other      SOCIAL HISTORY: Social History   Socioeconomic History  . Marital status: Married    Spouse name: Jori Moll  . Number of children: 1  . Years of education: 35  . Highest education level: Not on file  Occupational History  . Occupation: Nurse, learning disability: UNEMPLOYED  . Occupation: [place] [employer], occupational  Tobacco Use  . Smoking status: Former Smoker    Packs/day: 1.00    Years: 5.00    Pack years: 5.00    Types: Cigarettes    Quit date: 03/09/1979    Years since quitting: 41.1  . Smokeless tobacco: Never Used  Vaping Use  . Vaping Use: Never used  Substance and Sexual Activity  . Alcohol use: No  . Drug use: No  . Sexual activity: Not Currently    Partners: Male    Birth control/protection: Surgical  Other Topics Concern  . Not on file  Social History  Narrative  . Not on file   Social Determinants of Health   Financial Resource Strain: Not on file  Food Insecurity: Not on file  Transportation Needs: Not on file  Physical Activity: Not on file  Stress: Not on file  Social Connections: Not on file  Intimate Partner Violence: Not on file      PHYSICAL EXAM  Vitals:   05/05/20 1356  BP: 125/70  Pulse: 100  Weight: 134 lb (60.8 kg)  Height: 5\' 5"  (1.651 m)   Body mass index is 22.3 kg/m.   Generalized: Well developed, in no acute distress  Cardiology: normal rate and rhythm, no murmur auscultated  Respiratory: clear to auscultation bilaterally    Neurological examination  Mentation: Alert oriented to time, place, history taking. Follows all commands speech and language fluent Cranial nerve II-XII: Pupils were equal round reactive to light. Extraocular movements were full, visual field were full on confrontational test. Facial sensation and strength were normal. Uvula tongue midline. Head turning and shoulder shrug  were normal and symmetric. Motor: The motor testing reveals 5 over 5 strength of all 4 extremities. Good symmetric motor tone is noted throughout.  Sensory: Sensory testing is intact to soft touch on all 4 extremities. No evidence of extinction is noted.  Coordination: Cerebellar testing reveals good finger-nose-finger and heel-to-shin bilaterally.  Gait and station: Gait is normal.  Reflexes: Deep tendon reflexes are symmetric and normal bilaterally.     DIAGNOSTIC DATA (LABS, IMAGING, TESTING) - I reviewed patient records, labs, notes, testing and imaging myself where available.  Lab Results  Component Value Date   WBC 9.4 05/05/2020   HGB 12.6 05/05/2020   HCT 38.4 05/05/2020   MCV 94 05/05/2020   PLT 410 05/05/2020      Component Value Date/Time   NA 141 05/05/2020 1433   K 4.5 05/05/2020 1433   CL 101 05/05/2020 1433   CO2 25 05/05/2020 1433   GLUCOSE 99 05/05/2020 1433   GLUCOSE 76  08/07/2009 1008   BUN 15 05/05/2020 1433   CREATININE 0.99 05/05/2020 1433   CALCIUM 9.8 05/05/2020 1433   PROT 7.0 05/05/2020 1433   ALBUMIN 4.1 05/05/2020 1433   AST 24 05/05/2020 1433   ALT 18 05/05/2020 1433   ALKPHOS 71 05/05/2020 1433   BILITOT <0.2 05/05/2020 1433   GFRNONAA 68 01/26/2018 1415   GFRAA 79 01/26/2018 1415   Lab Results  Component Value Date   CHOL 221 (H) 08/07/2009   HDL 38.00 (L) 08/07/2009   LDLCALC 135 (H) 07/04/2007   LDLDIRECT 144.6 08/07/2009   TRIG 159.0 (H) 08/07/2009   CHOLHDL 6 08/07/2009   No results found for: HGBA1C No results found for: VITAMINB12 Lab Results  Component Value Date   TSH 2.04 08/07/2009    No flowsheet data found.   No flowsheet data found.   ASSESSMENT AND PLAN  65 y.o. year old female  has a past medical history of Anxiety, Bulging lumbar disc (1980), Chronic pansinusitis, Colon polyp, Depression, GERD (gastroesophageal reflux disease), Gynecological examination, Headache(784.0), Hypertension, Multiple sclerosis ([place]) (since 1992), Multiple thyroid nodules (1995), Neurogenic bladder disorder (12/2009), Neuropathy, Osteoarthritis, Osteoporosis, Overactive bladder, Pancreatitis (1977), Ulcerative proctitis ([place]), and Vision abnormalities. here with   High  risk medication use  Chronic fatigue  MULTIPLE SCLEROSIS - Plan: CBC with Differential/Platelets, CMP  Vitamin D deficiency - Plan: Vitamin D, 25-hydroxy  Chronic prescription opiate use  Sciatica of left side  Dysesthesia  Depression with anxiety  Mrs Goll is doing very well, today. She is tolerating medications with no adverse effects. We will continue [place] as prescribed. She will continue Fentanyl, gabapentin and tramadol for chronic pain. PDMP reviewed and shows appropriate refills. She was educated on the need to monitor closely for sedative and respiratory side effects. She will call to notify me of any concerns. She was encouraged to stay active.  Adequate hydration and well balanced diet advised. We will call in 5 scopolamine patches to help with motion sickness while on cruise. Side effects reviewed. She will follow up with Dr Felecia Shelling in 4 months, sooner if needed.   Orders Placed This Encounter  Procedures  . CBC with Differential/Platelets  . CMP  . Vitamin D, 25-hydroxy     Meds ordered this encounter  Medications  . scopolamine (TRANSDERM-SCOP, 1.5 MG,) 1 MG/3DAYS    Sig: Place 1 patch (1.5 mg total) onto the skin every 3 (three) days.    Dispense:  5 patch    Refill:  0    Order Specific Question:   Supervising Provider    Answer:   Melvenia Beam [id number]      I spent 30 minutes of face-to-face and non-face-to-face time with patient.  This included previsit chart review, lab review, study review, order entry, electronic health record documentation, patient education.    Debbora Presto, MSN, FNP-C 05/07/2020, 7:33 AM  [facility] [address] [place], [place] [address] [phone]

## 2020-05-06 LAB — CBC WITH DIFFERENTIAL/PLATELET
Basophils Absolute: 0.1 10*3/uL (ref 0.0–0.2)
Basos: 1 %
EOS (ABSOLUTE): 0.5 10*3/uL — ABNORMAL HIGH (ref 0.0–0.4)
Eos: 5 %
Hematocrit: 38.4 % (ref 34.0–46.6)
Hemoglobin: 12.6 g/dL (ref 11.1–15.9)
Immature Grans (Abs): 0.1 10*3/uL (ref 0.0–0.1)
Immature Granulocytes: 1 %
Lymphocytes Absolute: 3.9 10*3/uL — ABNORMAL HIGH (ref 0.7–3.1)
Lymphs: 41 %
MCH: 31 pg (ref 26.6–33.0)
MCHC: 32.8 g/dL (ref 31.5–35.7)
MCV: 94 fL (ref 79–97)
Monocytes Absolute: 0.9 10*3/uL (ref 0.1–0.9)
Monocytes: 9 %
Neutrophils Absolute: 4 10*3/uL (ref 1.4–7.0)
Neutrophils: 43 %
Platelets: 410 10*3/uL (ref 150–450)
RBC: 4.07 x10E6/uL (ref 3.77–5.28)
RDW: 11.8 % (ref 11.7–15.4)
WBC: 9.4 10*3/uL (ref 3.4–10.8)

## 2020-05-06 LAB — COMPREHENSIVE METABOLIC PANEL
ALT: 18 IU/L (ref 0–32)
AST: 24 IU/L (ref 0–40)
Albumin/Globulin Ratio: 1.4 (ref 1.2–2.2)
Albumin: 4.1 g/dL (ref 3.8–4.8)
Alkaline Phosphatase: 71 IU/L (ref 44–121)
BUN/Creatinine Ratio: 15 (ref 12–28)
BUN: 15 mg/dL (ref 8–27)
Bilirubin Total: <0.2 mg/dL (ref 0.0–1.2)
CO2: 25 mmol/L (ref 20–29)
Calcium: 9.8 mg/dL (ref 8.7–10.3)
Chloride: 101 mmol/L (ref 96–106)
Creatinine, Ser: 0.99 mg/dL (ref 0.57–1.00)
Globulin, Total: 2.9 g/dL (ref 1.5–4.5)
Glucose: 99 mg/dL (ref 65–99)
Potassium: 4.5 mmol/L (ref 3.5–5.2)
Sodium: 141 mmol/L (ref 134–144)
Total Protein: 7 g/dL (ref 6.0–8.5)
eGFR: 63 mL/min/{1.73_m2} (ref >59)

## 2020-05-06 LAB — VITAMIN D 25 HYDROXY (VIT D DEFICIENCY, FRACTURES): Vit D, 25-Hydroxy: 38 ng/mL (ref 30.0–100.0)

## 2020-05-07 ENCOUNTER — Encounter: Payer: Self-pay | Admitting: Family Medicine

## 2020-05-07 NOTE — Progress Notes (Signed)
I have read the note, and I agree with the clinical assessment and plan.  Richard A. Sater, MD, PhD, FAAN Certified in Neurology, Clinical Neurophysiology, Sleep Medicine, Pain Medicine and Neuroimaging  [facility] [address], [address] [place], [place] [address] [phone]  

## 2020-05-12 ENCOUNTER — Encounter: Payer: Self-pay | Admitting: Family Medicine

## 2020-05-13 MED ORDER — SCOPOLAMINE 1 MG/3DAYS TD PT72: 1.0000 | MEDICATED_PATCH | TRANSDERMAL | 0 refills | Status: DC

## 2020-05-13 NOTE — Telephone Encounter (Signed)
Called pt and she will use [facility] for her scopalmine patches 27.68 for 5 patches.  [facility] $127.50 for 5 patches OOP.  Pt was apreciative. Will go to [facility].  Printed rx to [facility] [phone], [phone]. Fax confirmation received.

## 2020-05-14 ENCOUNTER — Other Ambulatory Visit: Payer: Self-pay | Admitting: Neurology

## 2020-05-27 DIAGNOSIS — E781 Pure hyperglyceridemia: Secondary | ICD-10-CM | POA: Diagnosis not present

## 2020-05-27 DIAGNOSIS — F325 Major depressive disorder, single episode, in full remission: Secondary | ICD-10-CM | POA: Diagnosis not present

## 2020-05-27 DIAGNOSIS — M810 Age-related osteoporosis without current pathological fracture: Secondary | ICD-10-CM | POA: Diagnosis not present

## 2020-05-27 DIAGNOSIS — I10 Essential (primary) hypertension: Secondary | ICD-10-CM | POA: Diagnosis not present

## 2020-05-30 ENCOUNTER — Other Ambulatory Visit: Payer: Self-pay

## 2020-05-30 ENCOUNTER — Other Ambulatory Visit: Payer: Self-pay | Admitting: Neurology

## 2020-06-02 ENCOUNTER — Other Ambulatory Visit: Payer: Self-pay

## 2020-06-02 MED ORDER — FENTANYL 25 MCG/HR TD PT72: MEDICATED_PATCH | TRANSDERMAL | 0 refills | Status: DC

## 2020-06-06 ENCOUNTER — Other Ambulatory Visit: Payer: Self-pay | Admitting: Neurology

## 2020-06-09 MED ORDER — AMPHETAMINE-DEXTROAMPHETAMINE 20 MG PO TABS: ORAL_TABLET | ORAL | 0 refills | Status: DC

## 2020-06-09 NOTE — Telephone Encounter (Signed)
Adderall 20mg  last filled on 05/06/20 for #60. Next visit is 09/03/20 with Dr. Felecia Shelling.

## 2020-06-20 ENCOUNTER — Other Ambulatory Visit: Payer: Self-pay | Admitting: Neurology

## 2020-06-29 ENCOUNTER — Other Ambulatory Visit: Payer: Self-pay

## 2020-06-30 MED ORDER — FENTANYL 25 MCG/HR TD PT72: MEDICATED_PATCH | TRANSDERMAL | 0 refills | Status: DC

## 2020-07-07 ENCOUNTER — Other Ambulatory Visit: Payer: Self-pay | Admitting: Family Medicine

## 2020-07-07 MED ORDER — AMPHETAMINE-DEXTROAMPHETAMINE 20 MG PO TABS: ORAL_TABLET | ORAL | 0 refills | Status: DC

## 2020-07-21 ENCOUNTER — Other Ambulatory Visit: Payer: Self-pay | Admitting: Neurology

## 2020-07-21 NOTE — Telephone Encounter (Signed)
Last seen 05/05/20 and next follow up 09/03/20. Checked drug registry, she last refilled 06/20/20 #120.

## 2020-07-30 ENCOUNTER — Other Ambulatory Visit: Payer: Self-pay

## 2020-07-30 DIAGNOSIS — E781 Pure hyperglyceridemia: Secondary | ICD-10-CM | POA: Diagnosis not present

## 2020-07-30 DIAGNOSIS — M1712 Unilateral primary osteoarthritis, left knee: Secondary | ICD-10-CM | POA: Diagnosis not present

## 2020-07-30 DIAGNOSIS — F325 Major depressive disorder, single episode, in full remission: Secondary | ICD-10-CM | POA: Diagnosis not present

## 2020-07-30 DIAGNOSIS — I10 Essential (primary) hypertension: Secondary | ICD-10-CM | POA: Diagnosis not present

## 2020-07-30 MED ORDER — FENTANYL 25 MCG/HR TD PT72: MEDICATED_PATCH | TRANSDERMAL | 0 refills | Status: DC

## 2020-08-06 ENCOUNTER — Other Ambulatory Visit: Payer: Self-pay | Admitting: Family Medicine

## 2020-08-06 MED ORDER — AMPHETAMINE-DEXTROAMPHETAMINE 20 MG PO TABS: ORAL_TABLET | ORAL | 0 refills | Status: DC

## 2020-08-11 DIAGNOSIS — H524 Presbyopia: Secondary | ICD-10-CM | POA: Diagnosis not present

## 2020-08-29 ENCOUNTER — Other Ambulatory Visit: Payer: Self-pay

## 2020-08-29 MED ORDER — FENTANYL 25 MCG/HR TD PT72: MEDICATED_PATCH | TRANSDERMAL | 0 refills | Status: DC

## 2020-09-01 ENCOUNTER — Other Ambulatory Visit: Payer: Self-pay | Admitting: Neurology

## 2020-09-03 ENCOUNTER — Ambulatory Visit: Payer: Medicare Other | Admitting: Neurology

## 2020-09-03 ENCOUNTER — Encounter: Payer: Self-pay | Admitting: Neurology

## 2020-09-03 VITALS — BP 139/68 | HR 85 | Ht 65.0 in | Wt 131.0 lb

## 2020-09-03 DIAGNOSIS — G35 Multiple sclerosis: Secondary | ICD-10-CM | POA: Diagnosis not present

## 2020-09-03 DIAGNOSIS — Z79891 Long term (current) use of opiate analgesic: Secondary | ICD-10-CM

## 2020-09-03 DIAGNOSIS — R5382 Chronic fatigue, unspecified: Secondary | ICD-10-CM

## 2020-09-03 DIAGNOSIS — R208 Other disturbances of skin sensation: Secondary | ICD-10-CM

## 2020-09-03 DIAGNOSIS — Z79899 Other long term (current) drug therapy: Secondary | ICD-10-CM

## 2020-09-03 DIAGNOSIS — F418 Other specified anxiety disorders: Secondary | ICD-10-CM | POA: Diagnosis not present

## 2020-09-03 DIAGNOSIS — M5432 Sciatica, left side: Secondary | ICD-10-CM

## 2020-09-03 DIAGNOSIS — R3915 Urgency of urination: Secondary | ICD-10-CM

## 2020-09-03 MED ORDER — AMPHETAMINE-DEXTROAMPHETAMINE 20 MG PO TABS: ORAL_TABLET | ORAL | 0 refills | Status: DC

## 2020-09-03 NOTE — Progress Notes (Signed)
[facility]  PATIENT: Sheri Burns DOB: 1955/12/13  REFERRING CLINICIAN: Thressa Sheller  HISTORY FROM: Patient REASON FOR VISIT: MS   HISTORICAL  CHIEF COMPLAINT:  Chief Complaint  Patient presents with   Follow-up    RM 12, alone. Last seen 05/05/2020 by AL,NP. On [place] for [place].     HISTORY OF PRESENT ILLNESS:  Sheri Burns is a 65 year old woman with MS.     Update 09/03/2020: She feels her MS is stable.   She is on leflunomide and tolerates it well.     Gait is doing well.   No change in strength, sensation.   Vision is fine.    Bladder has some urinary urgency and mild stress incontinence helped by oxybutynin      She feels fatigue is better.   She is sleeping well most nights.     Mood is doing well in general.  Her back is still bothering her.  The worse pain is on the left side and she notes myalgia there as well.   Pain was worse when her husband was alive as he was difficult to move around.   An ESI August 2021 with benefit.   She exercises and does exercises learned in PT.    She sees [facility].    Recent MRI showed multilevel DJD.      MS History:   She was diagnosed with multiple sclerosis more than 20 years ago. Initially, she was treated with Betaseron then Copaxone but has not been on any disease modifying therapy for about 8 years. During this time, she has had occasional  Exacerbations with more fatigue and gait issues.  She also has noted mild cognitive dysfunction worsening over the past 5 or 6 years. Her MRI of the brain performed 08/31/2012 shows foci that are predominantly periventricular. They are consistent with the diagnosis of multiple sclerosis. When this MRI was compared to an MRI dated 10/12/2010, there was one additional small periventricular focus. There were no acute findings on either MRI.   In November 2015, she had an exacerbation with severe fatigue, worsened gait and dysphagia.    She started Aubagio 09/09/14.       IMAGING L-spine MRI ([facility]).    It does show significant degenerative changes at several levels.  At L4-L5 there appears to be a disc herniation towards the left that could be compressing the left L5 nerve root.  At L3-L4, there are changes more to the right that could affect the right L4 nerve root.  There are also degenerative changes at L1-L2 and L2-L3 though there does not appear to be nerve root compression at those levels.  MRI brain 01/04/2020 shows multiple T2/FLAIR hyperintense foci in the hemispheres with a few punctate foci in the pons and cerebral hemispheres. The pattern is consistent with chronic demyelinating plaque associated with multiple sclerosis. None of the foci appears to be acute. Compared to the MRI dated 08/31/2016, there are no new lesions.  MRI 08/31/2016 showed multiple lesions in the hemispheres and pons but no new lesions compared to 10/31/2014.  MRI 10/31/2014 showed periventricular and deep white matter T2/flair hyperintense foci in a pattern and configuration consistent with multiple sclerosis. None of these foci enhanced after contrast administration.  There were no new lesions compared to 08/30/2012 MRI  OTHER  NCV/EMG 10/18/2017 1.    Moderately severe right median neuropathy at the wrist (carpal tunnel syndrome).   Chronic denervation changes were noted in the APB muscle.  2.    Borderline left median neuropathy at the wrist. 3.    There is no evidence of a significant cervical radiculopathy on the right. REVIEW OF SYSTEMS:  Constitutional: No fevers, chills, sweats, or change in appetite.  She notes a lot of fatigue. She is sleeping well. Eyes: No visual changes, double vision, eye pain Ear, nose and throat: No hearing loss, ear pain, nasal congestion, sore throat Cardiovascular: No chest pain, palpitations Respiratory:  No shortness of breath at rest or with exertion.   No wheezes GastrointestinaI: No nausea, vomiting, diarrhea.  Has  constipation Genitourinary:  see above. Musculoskeletal:  reports pain in knees.   Reports neck pain and back pain Integumentary: No rash, pruritus, skin lesions Neurological: as above Psychiatric: Depression and  Anxiety noted Endocrine: No palpitations, diaphoresis, change in appetite, change in weigh or increased thirst Hematologic/Lymphatic:  No anemia, purpura, petechiae. Allergic/Immunologic: No itchy/runny eyes, nasal congestion, recent allergic reactions, rashes  ALLERGIES: Allergies  Allergen Reactions   Amoxicillin Hives and Itching   Codeine Nausea And Vomiting    vomiting   Demerol [Meperidine] Nausea And Vomiting   Ivp Dye [Iodinated Diagnostic Agents] Hives   Penicillins Hives   Stadol [Butorphanol] Other (See Comments)    Per patient psychotic event   Sulfonamide Derivatives Hives   Clindamycin/Lincomycin Rash   Gadolinium Derivatives Hives    HOME MEDICATIONS: Outpatient Medications Prior to Visit  Medication Sig Dispense Refill   acetaminophen (TYLENOL) 500 MG tablet Take 500 mg by mouth 4 (four) times daily.     amLODipine-valsartan (EXFORGE) 5-160 MG tablet Take 1 tablet by mouth daily.     Calcium Carb-Cholecalciferol (CALCIUM 600 + D PO) Take 1 tablet by mouth daily.     ciprofloxacin (CIPRO) 500 MG tablet Take 500 mg by mouth 2 (two) times daily.     clonazePAM (KLONOPIN) 1 MG tablet TAKE 1 TABLET BY MOUTH 3 TIMES DAILY AS NEEDED 270 tablet 1   denosumab (PROLIA) 60 MG/ML SOSY injection Inject 60 mg into the skin every 6 (six) months. 1 each 1   DULoxetine (CYMBALTA) 60 MG capsule TAKE 1 CAPSULE BY MOUTH EVERY DAY 90 capsule 3   fentaNYL (DURAGESIC) 25 MCG/HR Place 1 patch (25 mcg total) onto the skin every 3 (three) days. 10 patch 0   gabapentin (NEURONTIN) 600 MG tablet TAKE 1 TABLET BY MOUTH 4 TIMES DAILY 360 tablet 3   leflunomide (ARAVA) 20 MG tablet TAKE 1 TABLET BY MOUTH EVERY DAY 90 tablet 3   Multiple Vitamin (MULTIVITAMIN) tablet Take 1 tablet by  mouth daily.     oxybutynin (DITROPAN-XL) 10 MG 24 hr tablet Take 1 tablet (10 mg total) by mouth daily. 90 tablet 3   scopolamine (TRANSDERM-SCOP, 1.5 MG,) 1 MG/3DAYS Place 1 patch (1.5 mg total) onto the skin every 3 (three) days. 5 patch 0   traMADol (ULTRAM) 50 MG tablet TAKE 1 TABLET EVERY SIX HOURS AS NEEDED (8 AM, 12 NOON, 4 PM AND 8 PM) 120 tablet 5   amphetamine-dextroamphetamine (ADDERALL) 20 MG tablet Take one pill twice a day 60 tablet 0   fentaNYL (DURAGESIC) 25 MCG/HR Place 1 patch (25 mcg total) onto the skin every 3 (three) days. 10 patch 0   fentaNYL (DURAGESIC) 25 MCG/HR Place 1 patch (25 mcg total) onto the skin every 3 (three) days. 10 patch 0   No facility-administered medications prior to visit.    PAST MEDICAL HISTORY: Past Medical History:  Diagnosis Date   Anxiety  Bulging lumbar disc 1980   Chronic pansinusitis    Colon polyp    Depression    GERD (gastroesophageal reflux disease)    Gynecological examination    sees Dr. Caren Griffins [place]    WNIOEVOJ(500.9)    Hypertension    Multiple sclerosis [facility]) since 1992   Dr. Arlice Colt in [facility]   Multiple thyroid nodules 1995   per pt. resolved on its own follwed by endo then released   Neurogenic bladder disorder 12/2009   w/ urge & stress inc. dx. Dr. McDiarmid   Neuropathy    Osteoarthritis    Osteoporosis    last DEXA 2018   Overactive bladder    sees Dr. Nicki Reaper McDiarmid    Pancreatitis 1977   Ulcerative proctitis ([place])    Vision abnormalities     PAST SURGICAL HISTORY: Past Surgical History:  Procedure Laterality Date   APPENDECTOMY     breast ruptured  left   ductal cyst   CARPAL TUNNEL RELEASE Right    CHOLECYSTECTOMY     COLONOSCOPY  01/10/09   repeat in 5 yrs Dr. Wilford Corner   DILATION AND CURETTAGE OF UTERUS     LAPAROSCOPY     x 3   left knee surgery  1978   to remove bone tumor     NASAL SINUS SURGERY Bilateral 01/13/2015   Procedure: ENDOSCOPIC SINUS SURGERY;  Surgeon:  Izora Gala, MD;  Location: [place];  Service: ENT;  Laterality: Bilateral;   NASAL SINUS SURGERY  05/2017   NASAL TURBINATE REDUCTION     pubo vaginal sling  10/2003   TONSILLECTOMY     vaginal cystocele repair     anterior   VAGINAL HYSTERECTOMY  1995   AUB & Endometriosis   widom teeth removal      FAMILY HISTORY: Family History  Problem Relation Age of Onset   Osteoporosis Mother        severe   Cancer Mother 68       colon /Breast also m. niece at 1's   Breast cancer Mother    Osteopenia Sister        X 2   Breast cancer Other     SOCIAL HISTORY:  Social History   Socioeconomic History   Marital status: Married    Spouse name: Jori Moll   Number of children: 1   Years of education: 16   Highest education level: Not on file  Occupational History   Occupation: BS Nursing    Employer: UNEMPLOYED   Occupation: [employer], occupational  Tobacco Use   Smoking status: Former    Packs/day: 1.00    Years: 5.00    Pack years: 5.00    Types: Cigarettes    Quit date: 03/09/1979    Years since quitting: 41.5   Smokeless tobacco: Never  Vaping Use   Vaping Use: Never used  Substance and Sexual Activity   Alcohol use: No   Drug use: No   Sexual activity: Not Currently    Partners: Male    Birth control/protection: Surgical  Other Topics Concern   Not on file  Social History Narrative   Not on file   Social Determinants of Health   Financial Resource Strain: Not on file  Food Insecurity: Not on file  Transportation Needs: Not on file  Physical Activity: Not on file  Stress: Not on file  Social Connections: Not on file  Intimate Partner Violence: Not on file  PHYSICAL EXAM  Vitals:   09/03/20 1436  BP: 139/68  Pulse: 85  Weight: 131 lb (59.4 kg)  Height: 5\' 5"  (1.651 m)    Body mass index is 21.8 kg/m.   General: The patient is well-developed and well-nourished and in no acute distress.     Neurologic Exam  Mental status: The  patient is alert and oriented x 3 at the time of the examination. The patient has apparent normal recent and remote memory, with an apparently normal attention span and concentration ability.   Speech is normal.  Cranial nerves: Extraocular movements are full.  Facial strength and sensation is normal.  Trapezius strength is strong..  Motor:  Muscle bulk is normal and tone is increased in legs. Strength is  5 / 5  Sensory: She has numbness and allodynia in the left L5 distribution.  .   Coordination: Cerebellar testing showed good heel to shin but and finger to nose  Gait and station: Station is stable.  The gait is slightly wide and arthritic.  Tandem gait is mildly wide  Romberg sign is negative.   Reflexes: Deep tendon reflexes are symmetric and fairly normal in the arms. However, she has increased reflexes in the legs with spread at the knees but no clonus in the ankles...    ______________________________   MULTIPLE SCLEROSIS - Plan: CBC with Differential/Platelet, Comprehensive metabolic panel, Drug Screen, Ur (12+Oxycodone+Crt)  Chronic prescription opiate use - Plan: Drug Screen, Ur (12+Oxycodone+Crt)  Sciatica of left side  Depression with anxiety  Urinary urgency  Dysesthesia  Chronic fatigue  High risk medication use  1.  Continue leflunomide for MS.  She has had recent blood work with primary care.   2.   Continue fentanyl and robaxin for back pain/spasms.   3.   Try to stay active and exercise as tolerated 4.    She is on fentanyl patches and a prn benzodiazepine for pain and muscle spasticity and Adderall for attention deficit from her MS..   The [place] controlled substance database has been reviewed.  She has been compliant and does not get medication from other doctors.  There is no escalation.   5.    Return to see [place] in 4 months or sooner for new or worsening neurologic symptoms    Lindel Marcell A. Felecia Shelling, MD, PhD 6/94/8546, 2:70 PM Certified in Neurology,  Clinical Neurophysiology, Sleep Medicine, Pain Medicine and Neuroimaging  [facility] [address], [place], [place] [address] [phone]

## 2020-09-04 LAB — COMPREHENSIVE METABOLIC PANEL
ALT: 20 IU/L (ref 0–32)
AST: 28 IU/L (ref 0–40)
Albumin/Globulin Ratio: 1.4 (ref 1.2–2.2)
Albumin: 4.2 g/dL (ref 3.8–4.8)
Alkaline Phosphatase: 62 IU/L (ref 44–121)
BUN/Creatinine Ratio: 15 (ref 12–28)
BUN: 13 mg/dL (ref 8–27)
Bilirubin Total: <0.2 mg/dL (ref 0.0–1.2)
CO2: 27 mmol/L (ref 20–29)
Calcium: 10.4 mg/dL — ABNORMAL HIGH (ref 8.7–10.3)
Chloride: 103 mmol/L (ref 96–106)
Creatinine, Ser: 0.89 mg/dL (ref 0.57–1.00)
Globulin, Total: 2.9 g/dL (ref 1.5–4.5)
Glucose: 93 mg/dL (ref 65–99)
Potassium: 4.9 mmol/L (ref 3.5–5.2)
Sodium: 143 mmol/L (ref 134–144)
Total Protein: 7.1 g/dL (ref 6.0–8.5)
eGFR: 71 mL/min/{1.73_m2} (ref >59)

## 2020-09-04 LAB — CBC WITH DIFFERENTIAL/PLATELET
Basophils Absolute: 0.1 10*3/uL (ref 0.0–0.2)
Basos: 1 %
EOS (ABSOLUTE): 0.6 10*3/uL — ABNORMAL HIGH (ref 0.0–0.4)
Eos: 7 %
Hematocrit: 37.5 % (ref 34.0–46.6)
Hemoglobin: 12.3 g/dL (ref 11.1–15.9)
Immature Grans (Abs): 0 10*3/uL (ref 0.0–0.1)
Immature Granulocytes: 0 %
Lymphocytes Absolute: 2.4 10*3/uL (ref 0.7–3.1)
Lymphs: 28 %
MCH: 30.1 pg (ref 26.6–33.0)
MCHC: 32.8 g/dL (ref 31.5–35.7)
MCV: 92 fL (ref 79–97)
Monocytes Absolute: 1 10*3/uL — ABNORMAL HIGH (ref 0.1–0.9)
Monocytes: 12 %
Neutrophils Absolute: 4.4 10*3/uL (ref 1.4–7.0)
Neutrophils: 52 %
Platelets: 352 10*3/uL (ref 150–450)
RBC: 4.08 x10E6/uL (ref 3.77–5.28)
RDW: 12.2 % (ref 11.7–15.4)
WBC: 8.5 10*3/uL (ref 3.4–10.8)

## 2020-09-10 NOTE — Progress Notes (Signed)
65 y.o. G25P1001 Married Caucasian female here for annual exam.    Oxybutynin is working but stress incontinence is progressive.  Sees Dr. Matilde Sprang.   No vaginal discharge, itching, burning or irritation.   PCP:   Lavone Orn, MD Neurology:  Dr. Felecia Shelling  Patient's last menstrual period was 07/06/1993.           Sexually active: No.  The Simson method of family planning is none.    Exercising: No.   Smoker:  no  Health Maintenance: Pap:  ? yrs History of abnormal Pap:  no MMG:  10-08-19 normal Colonoscopy: 2021 normal BMD:   12-18-18  Result  Osteoporosis.  Prolia at PCP office.  TDaP:  2017 Gardasil:   no ATF:TDDUK Hep C:Never Screening Labs:  Hb today: PCP, Urine today: PCP   reports that she quit smoking about 41 years ago. Her smoking use included cigarettes. She has a 5.00 pack-year smoking history. She has never used smokeless tobacco. She reports that she does not drink alcohol and does not use drugs.  Past Medical History:  Diagnosis Date   Anxiety    Bulging lumbar disc 1980   Chronic pansinusitis    Colon polyp    Depression    GERD (gastroesophageal reflux disease)    Gynecological examination    sees Dr. Caren Griffins Romine    GURKYHCW(237.6)    Hypertension    Multiple sclerosis [facility]) since 1992   Dr. Arlice Colt in [facility]   Multiple thyroid nodules 1995   per pt. resolved on its own follwed by endo then released   Neurogenic bladder disorder 12/2009   w/ urge & stress inc. dx. Dr. McDiarmid   Neuropathy    Osteoarthritis    Osteoporosis    last DEXA 2018   Overactive bladder    sees Dr. Nicki Reaper McDiarmid    Pancreatitis 1977   Ulcerative proctitis ([place])    Vision abnormalities     Past Surgical History:  Procedure Laterality Date   APPENDECTOMY     breast ruptured  left   ductal cyst   CARPAL TUNNEL RELEASE Right    CHOLECYSTECTOMY     COLONOSCOPY  01/10/09   repeat in 5 yrs Dr. Wilford Corner   DILATION AND CURETTAGE OF UTERUS      LAPAROSCOPY     x 3   left knee surgery  1978   to remove bone tumor     NASAL SINUS SURGERY Bilateral 01/13/2015   Procedure: ENDOSCOPIC SINUS SURGERY;  Surgeon: Izora Gala, MD;  Location: [place];  Service: ENT;  Laterality: Bilateral;   NASAL SINUS SURGERY  05/2017   NASAL TURBINATE REDUCTION     pubo vaginal sling  10/2003   TONSILLECTOMY     vaginal cystocele repair     anterior   VAGINAL HYSTERECTOMY  1995   AUB & Endometriosis   widom teeth removal      [facility] [facility] Medications  Medication Sig Dispense Refill   acetaminophen (TYLENOL) 500 MG tablet Take 500 mg by mouth 4 (four) times daily.     amLODipine-valsartan (EXFORGE) 5-160 MG tablet Take 1 tablet by mouth daily.     amphetamine-dextroamphetamine (ADDERALL) 20 MG tablet Take one pill twice a day 60 tablet 0   Calcium Carb-Cholecalciferol (CALCIUM 600 + D PO) Take 1 tablet by mouth daily.     clonazePAM (KLONOPIN) 1 MG tablet TAKE 1 TABLET BY MOUTH 3 TIMES DAILY AS NEEDED 270 tablet 1  denosumab (PROLIA) 60 MG/ML SOSY injection Inject 60 mg into the skin every 6 (six) months. 1 each 1   DULoxetine (CYMBALTA) 60 MG capsule TAKE 1 CAPSULE BY MOUTH EVERY DAY 90 capsule 3   fentaNYL (DURAGESIC) 25 MCG/HR Place 1 patch (25 mcg total) onto the skin every 3 (three) days. 10 patch 0   gabapentin (NEURONTIN) 600 MG tablet TAKE 1 TABLET BY MOUTH 4 TIMES DAILY 360 tablet 3   leflunomide (ARAVA) 20 MG tablet TAKE 1 TABLET BY MOUTH EVERY DAY 90 tablet 3   Multiple Vitamin (MULTIVITAMIN) tablet Take 1 tablet by mouth daily.     oxybutynin (DITROPAN-XL) 10 MG 24 hr tablet Take 1 tablet (10 mg total) by mouth daily. 90 tablet 3   traMADol (ULTRAM) 50 MG tablet TAKE 1 TABLET EVERY SIX HOURS AS NEEDED (8 AM, 12 NOON, 4 PM AND 8 PM) 120 tablet 5   scopolamine (TRANSDERM-SCOP, 1.5 MG,) 1 MG/3DAYS Place 1 patch (1.5 mg total) onto the skin every 3 (three) days. (Patient not taking: Reported on 09/17/2020) 5 patch 0    No [place] facility-administered medications for this visit.    Family History  Problem Relation Age of Onset   Osteoporosis Mother        severe   Cancer Mother 43       colon /Breast also m. niece at 47's   Breast cancer Mother    Osteopenia Sister        X 2   Breast cancer Other     Review of Systems  All other systems reviewed and are negative.  Exam:   BP 124/80 (BP Location: Right Arm, Patient Position: Sitting, Cuff Size: Normal)   Pulse 66   Ht 5\' 5"  (1.651 m)   Wt 126 lb (57.2 kg)   LMP 07/06/1993   SpO2 98%   BMI 20.97 kg/m     General appearance: alert, cooperative and appears stated age Head: normocephalic, without obvious abnormality, atraumatic Neck: no adenopathy, supple, symmetrical, trachea midline and thyroid normal to inspection and palpation Lungs: clear to auscultation bilaterally Breasts: normal appearance, no masses or tenderness, No nipple retraction or dimpling, No nipple discharge or bleeding, No axillary adenopathy Heart: regular rate and rhythm Abdomen: soft, non-tender; no masses, no organomegaly Extremities: extremities normal, atraumatic, no cyanosis or edema Skin: skin color, texture, turgor normal. No rashes or lesions Lymph nodes: cervical, supraclavicular, and axillary nodes normal. Neurologic: grossly normal  Pelvic: External genitalia:  no lesions              No abnormal inguinal nodes palpated.              Urethra:  normal appearing urethra with no masses, tenderness or lesions              Bartholins and Skenes: normal                 Vagina: normal appearing vagina with normal color and discharge, no lesions              Cervix: no lesions              Pap taken: no Bimanual Exam:  Uterus:  normal size, contour, position, consistency, mobility, non-tender              Adnexa: no mass, fullness, tenderness              Rectal exam: yes.  Confirms.  Anus:  normal sphincter tone, no lesions  Chaperone was  present for exam.  Assessment:    Pelvic exam with abnormal finding absent. Status post TVH.  Still has ovaries. Status post pubovaginal sling.  MS and neurogenic bladder.  On Ditropan XL. On Cymbalta. Screening breast exam. Rectal exam. Chronic pain.  Back pain.  Osteoporosis.  On Prolia. FH colon and breast cancer.    Plan: Mammogram screening discussed. Self breast awareness reviewed. Pap and HR HPV as above. Guidelines for Calcium, Vitamin D, regular exercise program including cardiovascular and weight bearing exercise. FU in 2 years and prn.  After visit summary provided.

## 2020-09-11 DIAGNOSIS — J324 Chronic pansinusitis: Secondary | ICD-10-CM | POA: Diagnosis not present

## 2020-09-12 LAB — DRUG SCREEN, UR (12+OXYCODONE+CRT)
Amphetamine Scrn, Ur: POSITIVE — AB
BARBITURATE SCREEN URINE: NEGATIVE ng/mL
BENZODIAZEPINE SCREEN, URINE: NEGATIVE ng/mL
CANNABINOIDS UR QL SCN: NEGATIVE ng/mL
Cocaine (Metab) Scrn, Ur: NEGATIVE ng/mL
Creatinine(Crt), U: 154.9 mg/dL (ref 20.0–300.0)
Fentanyl, Urine: POSITIVE — AB
Meperidine Screen, Urine: NEGATIVE ng/mL
Methadone Screen, Urine: NEGATIVE ng/mL
OXYCODONE+OXYMORPHONE UR QL SCN: NEGATIVE ng/mL
Opiate Scrn, Ur: NEGATIVE ng/mL
Ph of Urine: 6.2 (ref 4.5–8.9)
Phencyclidine Qn, Ur: NEGATIVE ng/mL
Propoxyphene Scrn, Ur: NEGATIVE ng/mL
SPECIFIC GRAVITY: 1.018
Tramadol Screen, Urine: POSITIVE — AB

## 2020-09-17 ENCOUNTER — Ambulatory Visit (INDEPENDENT_AMBULATORY_CARE_PROVIDER_SITE_OTHER): Payer: Medicare Other | Admitting: Obstetrics and Gynecology

## 2020-09-17 ENCOUNTER — Other Ambulatory Visit: Payer: Self-pay

## 2020-09-17 ENCOUNTER — Encounter: Payer: Self-pay | Admitting: Obstetrics and Gynecology

## 2020-09-17 VITALS — BP 124/80 | HR 66 | Ht 65.0 in | Wt 126.0 lb

## 2020-09-17 DIAGNOSIS — Z008 Encounter for other general examination: Secondary | ICD-10-CM

## 2020-09-17 DIAGNOSIS — Z01419 Encounter for gynecological examination (general) (routine) without abnormal findings: Secondary | ICD-10-CM | POA: Diagnosis not present

## 2020-09-17 DIAGNOSIS — Z1239 Encounter for other screening for malignant neoplasm of breast: Secondary | ICD-10-CM

## 2020-09-17 NOTE — Patient Instructions (Signed)

## 2020-09-18 ENCOUNTER — Other Ambulatory Visit: Payer: Self-pay | Admitting: Internal Medicine

## 2020-09-18 DIAGNOSIS — Z1231 Encounter for screening mammogram for malignant neoplasm of breast: Secondary | ICD-10-CM

## 2020-09-20 DIAGNOSIS — Z23 Encounter for immunization: Secondary | ICD-10-CM | POA: Diagnosis not present

## 2020-09-26 DIAGNOSIS — R350 Frequency of micturition: Secondary | ICD-10-CM | POA: Diagnosis not present

## 2020-09-26 DIAGNOSIS — N3946 Mixed incontinence: Secondary | ICD-10-CM | POA: Diagnosis not present

## 2020-09-27 ENCOUNTER — Other Ambulatory Visit: Payer: Self-pay

## 2020-09-29 ENCOUNTER — Other Ambulatory Visit: Payer: Self-pay | Admitting: Neurology

## 2020-09-29 DIAGNOSIS — M4180 Other forms of scoliosis, site unspecified: Secondary | ICD-10-CM | POA: Diagnosis not present

## 2020-09-29 DIAGNOSIS — M5416 Radiculopathy, lumbar region: Secondary | ICD-10-CM | POA: Diagnosis not present

## 2020-09-29 MED ORDER — AMPHETAMINE-DEXTROAMPHETAMINE 20 MG PO TABS: ORAL_TABLET | ORAL | 0 refills | Status: DC

## 2020-09-29 MED ORDER — FENTANYL 25 MCG/HR TD PT72: MEDICATED_PATCH | TRANSDERMAL | 0 refills | Status: DC

## 2020-10-01 DIAGNOSIS — F325 Major depressive disorder, single episode, in full remission: Secondary | ICD-10-CM | POA: Diagnosis not present

## 2020-10-01 DIAGNOSIS — M19041 Primary osteoarthritis, right hand: Secondary | ICD-10-CM | POA: Diagnosis not present

## 2020-10-01 DIAGNOSIS — M1712 Unilateral primary osteoarthritis, left knee: Secondary | ICD-10-CM | POA: Diagnosis not present

## 2020-10-01 DIAGNOSIS — M19042 Primary osteoarthritis, left hand: Secondary | ICD-10-CM | POA: Diagnosis not present

## 2020-10-07 DIAGNOSIS — M5416 Radiculopathy, lumbar region: Secondary | ICD-10-CM | POA: Diagnosis not present

## 2020-10-24 DIAGNOSIS — M5416 Radiculopathy, lumbar region: Secondary | ICD-10-CM | POA: Diagnosis not present

## 2020-10-31 DIAGNOSIS — M810 Age-related osteoporosis without current pathological fracture: Secondary | ICD-10-CM | POA: Diagnosis not present

## 2020-11-03 ENCOUNTER — Other Ambulatory Visit: Payer: Self-pay

## 2020-11-04 MED ORDER — AMPHETAMINE-DEXTROAMPHETAMINE 20 MG PO TABS: ORAL_TABLET | ORAL | 0 refills | Status: DC

## 2020-11-12 ENCOUNTER — Ambulatory Visit
Admission: RE | Admit: 2020-11-12 | Discharge: 2020-11-12 | Disposition: A | Discharge status: Home / Self Care | Payer: Medicare Other | Source: Ambulatory Visit | Attending: Internal Medicine | Admitting: Internal Medicine

## 2020-11-12 ENCOUNTER — Other Ambulatory Visit: Payer: Self-pay

## 2020-11-12 DIAGNOSIS — Z1231 Encounter for screening mammogram for malignant neoplasm of breast: Secondary | ICD-10-CM | POA: Diagnosis not present

## 2020-11-12 IMAGING — MG MM DIGITAL SCREENING BILAT W/ TOMO AND CAD
8 series · 8 of 24 positions shown · non-contrast
Comparison: Previous exam(s).

CLINICAL DATA: Screening.

EXAM:
DIGITAL SCREENING BILATERAL MAMMOGRAM WITH TOMOSYNTHESIS AND CAD
TECHNIQUE: Bilateral screening digital craniocaudal and mediolateral oblique
mammograms were obtained. Bilateral screening digital breast
tomosynthesis was performed. The images were evaluated with
computer-aided detection.

[R MLO synth-2D]
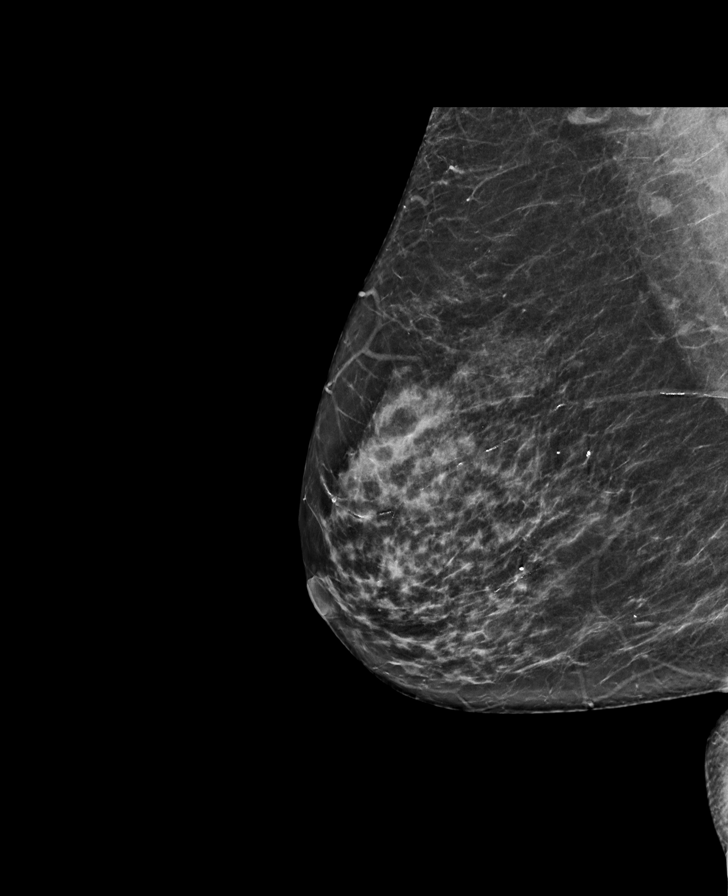

[L CC synth-2D]
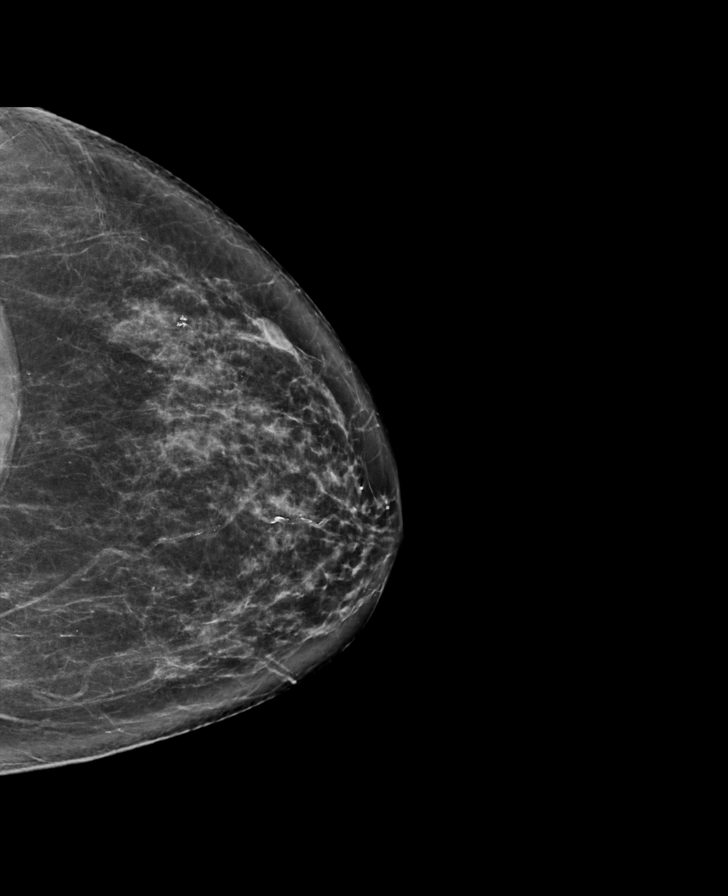

[L MLO synth-2D]
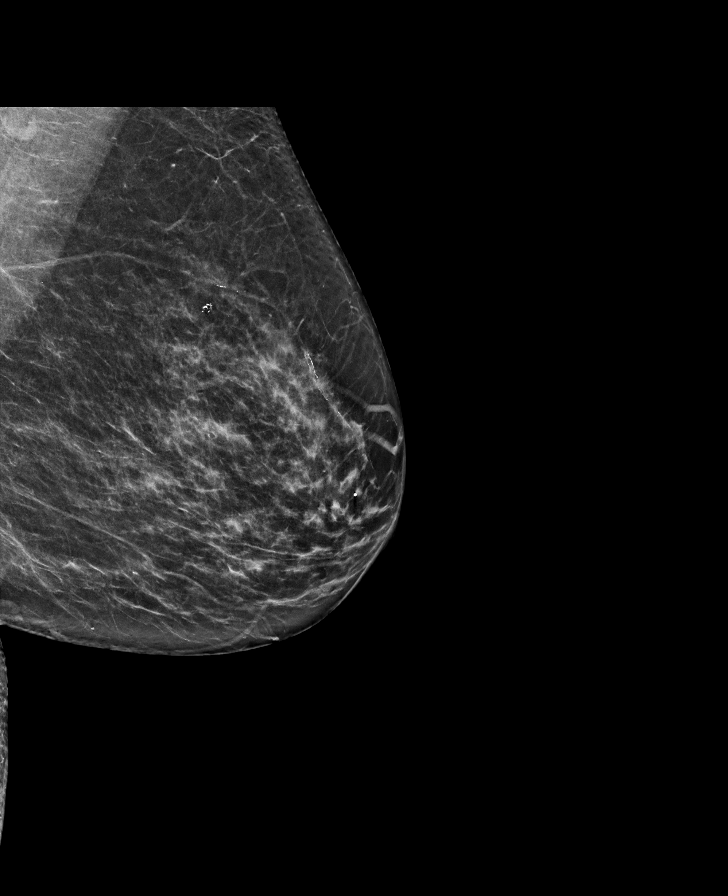

[R CC synth-2D]
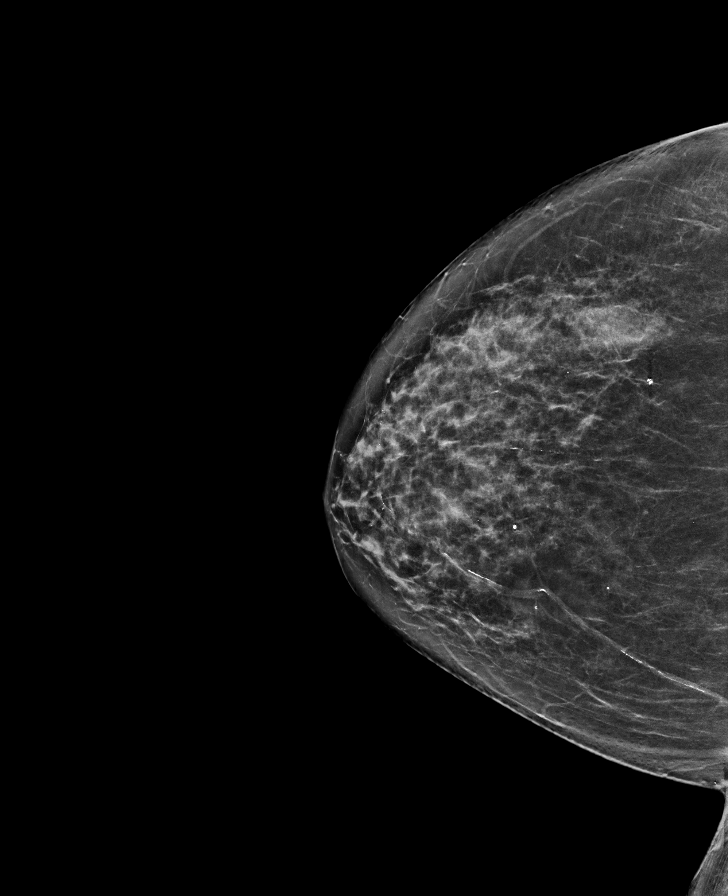

[L CC tomo · tomo slice 37/73.0]
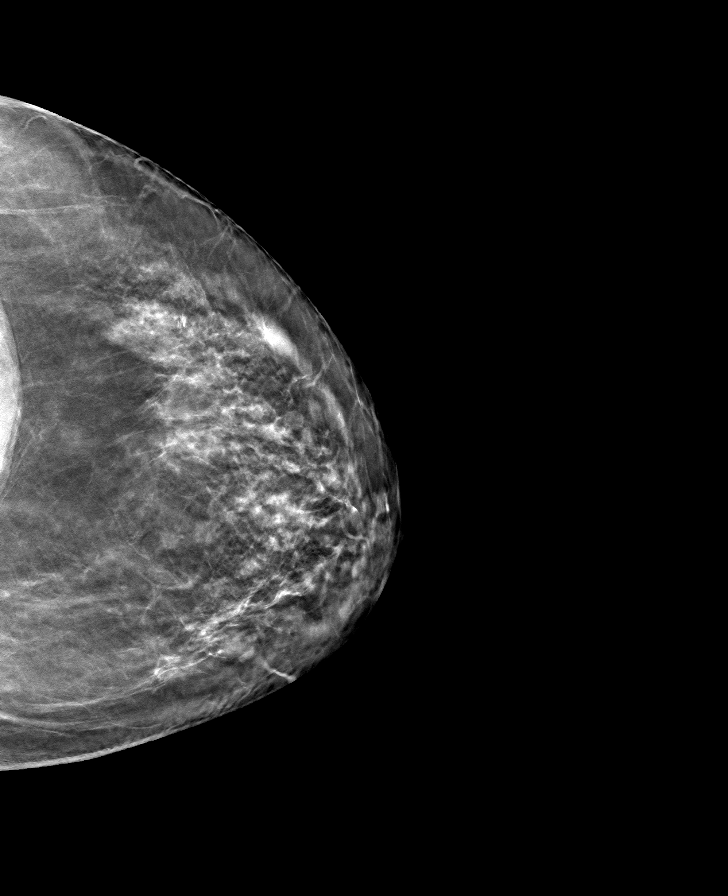

[R CC tomo · tomo slice 34/67.0]
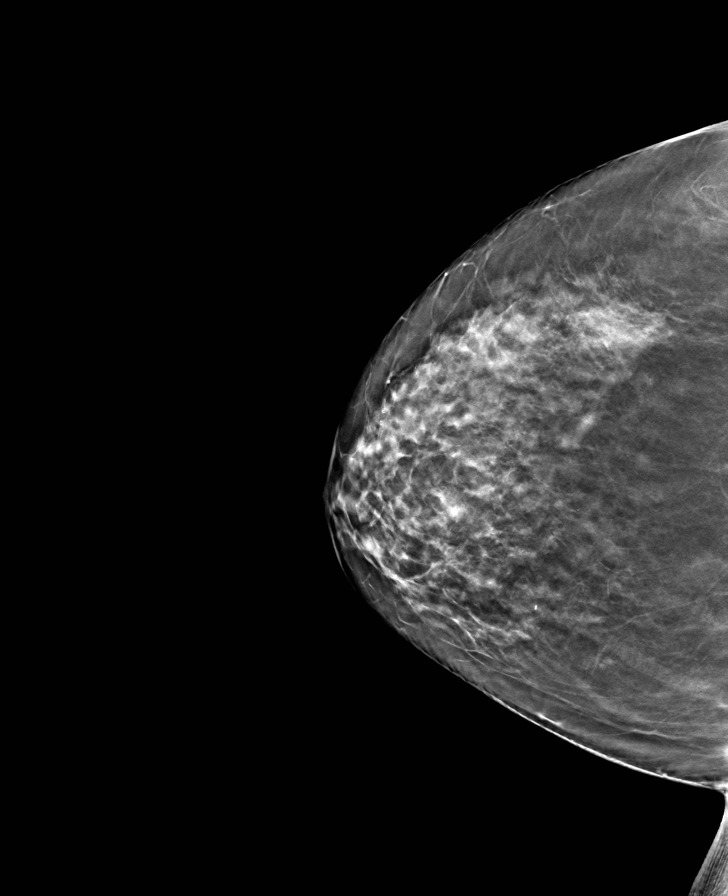

[L MLO tomo · tomo slice 35/68.0]
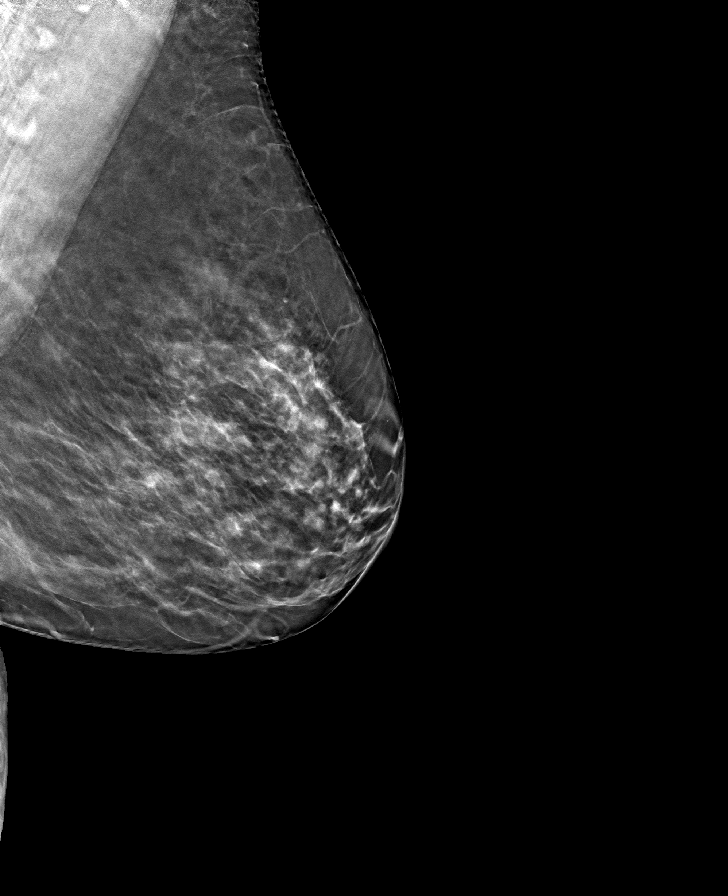

[R MLO tomo · tomo slice 35/69.0]
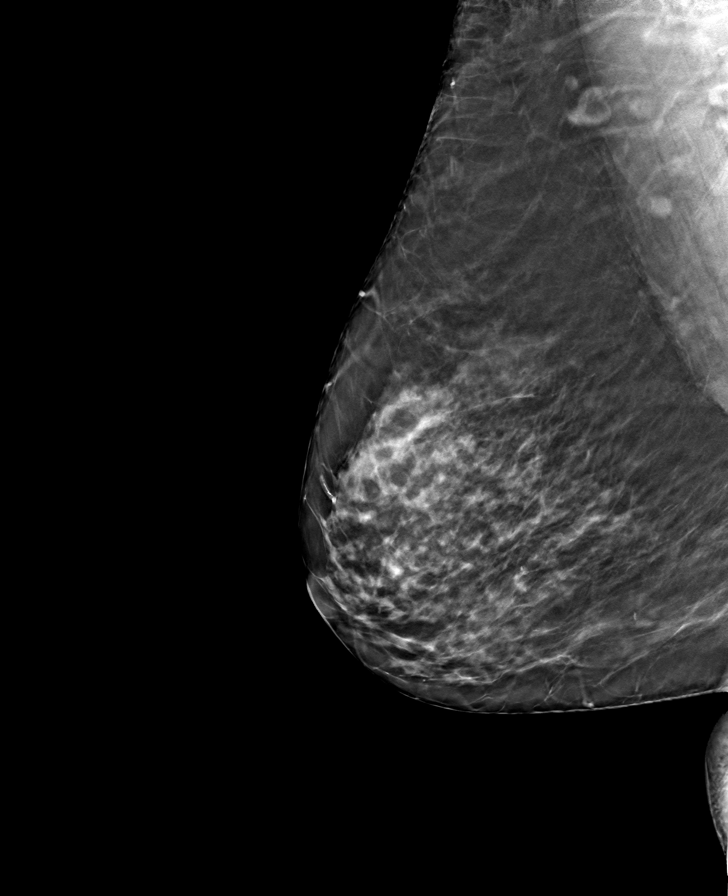

[8 of 24 positions shown; findings below may reference images not displayed]

ACR Breast Density Category b: There are scattered areas of
fibroglandular density.
FINDINGS: There are no findings suspicious for malignancy.
IMPRESSION: No mammographic evidence of malignancy. A result letter of this
screening mammogram will be mailed directly to the patient.

RECOMMENDATION:
Screening mammogram in one year. (Code:[BY])

BI-RADS CATEGORY  1: Negative.

## 2020-11-28 ENCOUNTER — Other Ambulatory Visit: Payer: Self-pay

## 2020-11-28 DIAGNOSIS — M25562 Pain in left knee: Secondary | ICD-10-CM | POA: Diagnosis not present

## 2020-11-28 DIAGNOSIS — M1712 Unilateral primary osteoarthritis, left knee: Secondary | ICD-10-CM | POA: Diagnosis not present

## 2020-12-01 ENCOUNTER — Other Ambulatory Visit: Payer: Self-pay

## 2020-12-01 ENCOUNTER — Other Ambulatory Visit: Payer: Self-pay | Admitting: Neurology

## 2020-12-01 MED ORDER — FENTANYL 25 MCG/HR TD PT72: MEDICATED_PATCH | TRANSDERMAL | 0 refills | Status: DC

## 2020-12-01 MED ORDER — AMPHETAMINE-DEXTROAMPHETAMINE 20 MG PO TABS: ORAL_TABLET | ORAL | 0 refills | Status: DC

## 2020-12-01 NOTE — Telephone Encounter (Signed)
Patient is up to date on her appointments. Pt is due for a refill on fentanyl. [place] Controlled Substance Registry checked and is appropriate.

## 2020-12-01 NOTE — Telephone Encounter (Signed)
Received refill request for Adderall.  Last OV was on 09/03/20.  Next OV is scheduled for 02/23/21 .  Last RX was written on 11/04/20 for 60 tabs.   [place] Drug Database has been reviewed.

## 2020-12-01 NOTE — Telephone Encounter (Signed)
Patient is up to date on her appointments. Pt is due for a refill on klonopon. [place] Controlled Substance Registry checked and is appropriate.

## 2020-12-04 DIAGNOSIS — M5416 Radiculopathy, lumbar region: Secondary | ICD-10-CM | POA: Diagnosis not present

## 2020-12-08 DIAGNOSIS — M5416 Radiculopathy, lumbar region: Secondary | ICD-10-CM | POA: Diagnosis not present

## 2020-12-09 ENCOUNTER — Other Ambulatory Visit: Payer: Self-pay | Admitting: Neurosurgery

## 2020-12-09 DIAGNOSIS — M5416 Radiculopathy, lumbar region: Secondary | ICD-10-CM

## 2020-12-11 DIAGNOSIS — H5213 Myopia, bilateral: Secondary | ICD-10-CM | POA: Diagnosis not present

## 2020-12-15 ENCOUNTER — Other Ambulatory Visit: Payer: Self-pay | Admitting: Neurology

## 2020-12-16 NOTE — Telephone Encounter (Signed)
Received refill request for Tramadol.  Last OV was on 09/03/20.  Next OV is scheduled for 02/23/21 .  Last RX was written on 11/14/20 for 120 tabs.   [place] Drug Database has been reviewed.

## 2020-12-22 ENCOUNTER — Ambulatory Visit
Admission: RE | Admit: 2020-12-22 | Discharge: 2020-12-22 | Disposition: A | Discharge status: Home / Self Care | Payer: Medicare Other | Source: Ambulatory Visit | Attending: Neurosurgery | Admitting: Neurosurgery

## 2020-12-22 ENCOUNTER — Other Ambulatory Visit: Payer: Self-pay

## 2020-12-22 DIAGNOSIS — M545 Low back pain, unspecified: Secondary | ICD-10-CM | POA: Diagnosis not present

## 2020-12-22 DIAGNOSIS — M48061 Spinal stenosis, lumbar region without neurogenic claudication: Secondary | ICD-10-CM | POA: Diagnosis not present

## 2020-12-22 DIAGNOSIS — M5416 Radiculopathy, lumbar region: Secondary | ICD-10-CM

## 2020-12-22 IMAGING — MR MR LUMBAR SPINE W/O CM
4 of 5 series · 17 of 48 positions shown · non-contrast
Comparison: MR lumbar [DATE].

CLINICAL DATA: Lumbar radiculopathy. Chronic bilateral posterior
leg pain.

EXAM:
MRI LUMBAR SPINE WITHOUT CONTRAST
TECHNIQUE: Multiplanar, multisequence MR imaging of the lumbar spine was
performed. No intravenous contrast was administered.

[Series 6: T2 · sagittal · 4.0mm · 0.73mm/px · 6 of 18 slices shown (1 of 2)]
[im 1/18]
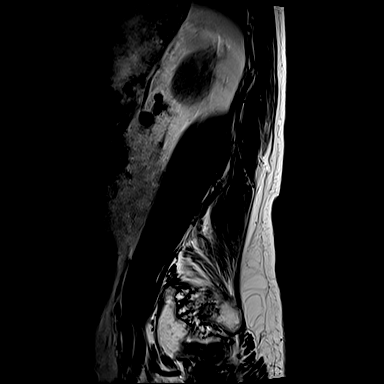
[im 4/18]
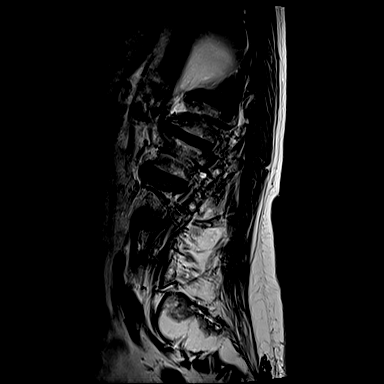
[im 7/18]
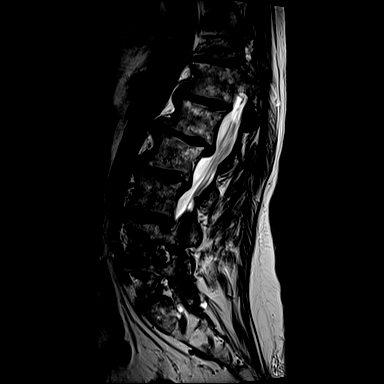
[im 11/18]
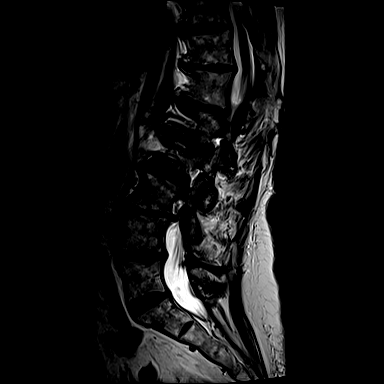
[im 14/18]
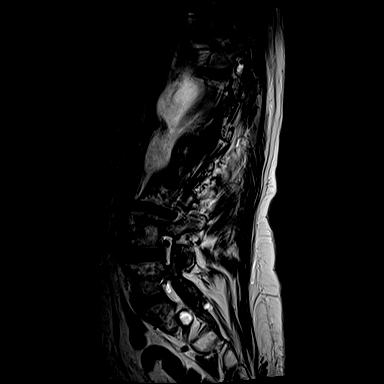
[im 18/18]
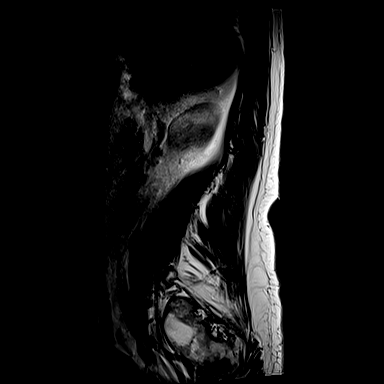

[Series 7: T1 · sagittal · 4.0mm · 0.73mm/px · 3 of 18 slices shown (1 of 2)]
[im 3/18]
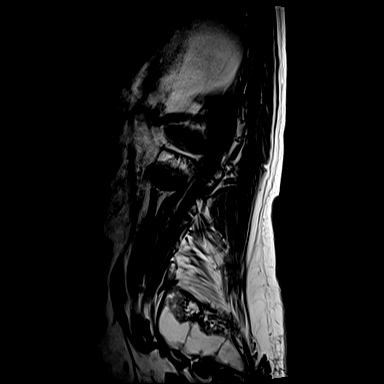
[im 9/18]
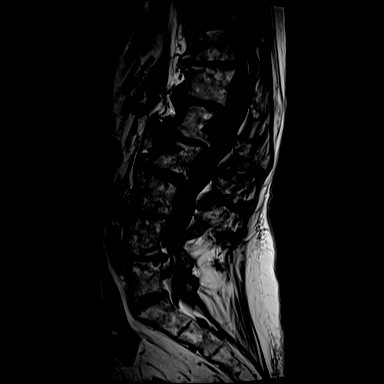
[im 15/18]
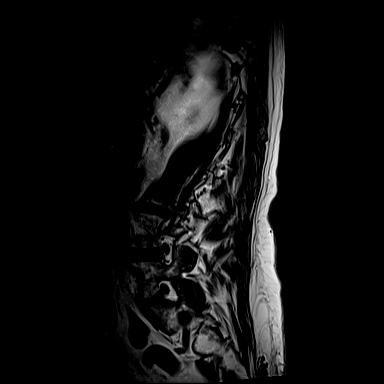

[Series 11: T1 · axial · 4.0mm · 0.28mm/px · z∈[-78,+69]mm · 3 of 36 slices shown (2 of 2)]
[im 6/36]
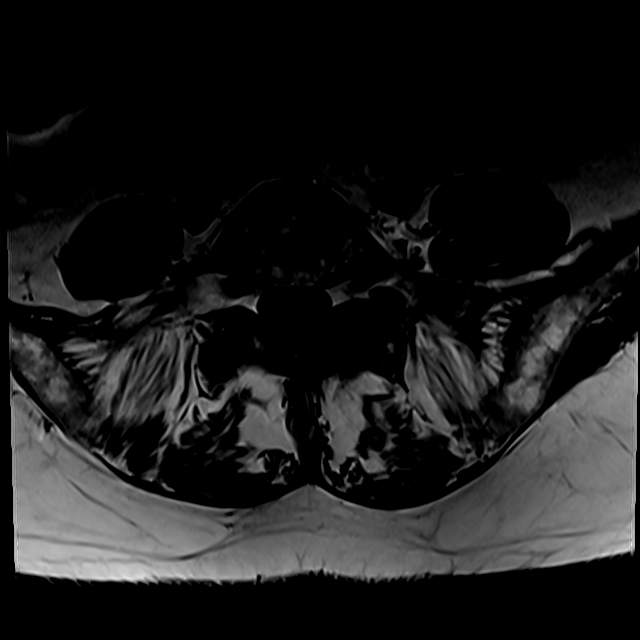
[im 19/36]
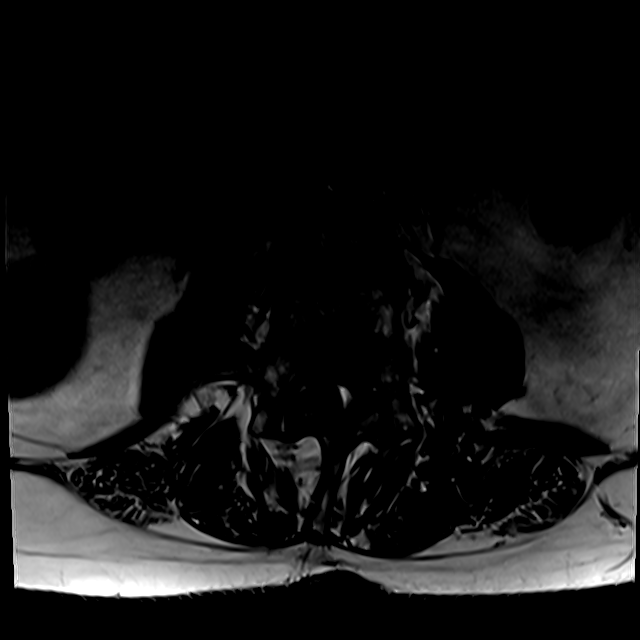
[im 30/36]
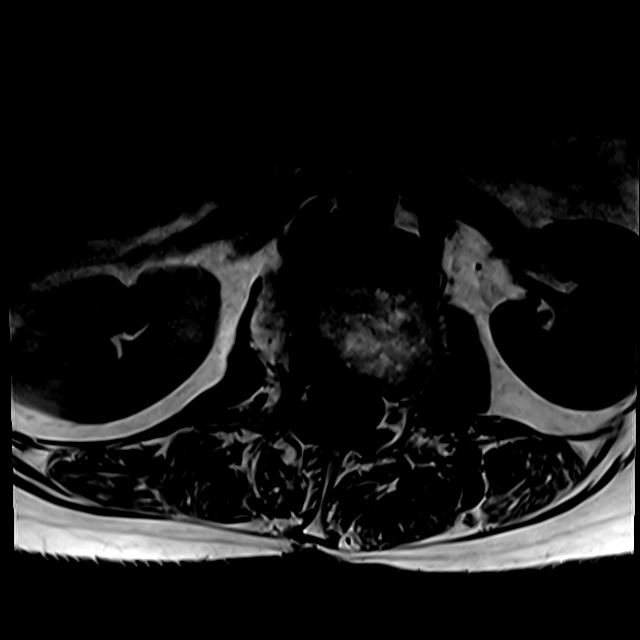

[Series 14: T2 · axial · 4.0mm · 0.28mm/px · z∈[-103,+69]mm · 5 of 36 slices shown (2 of 2)]
[im 1/36]
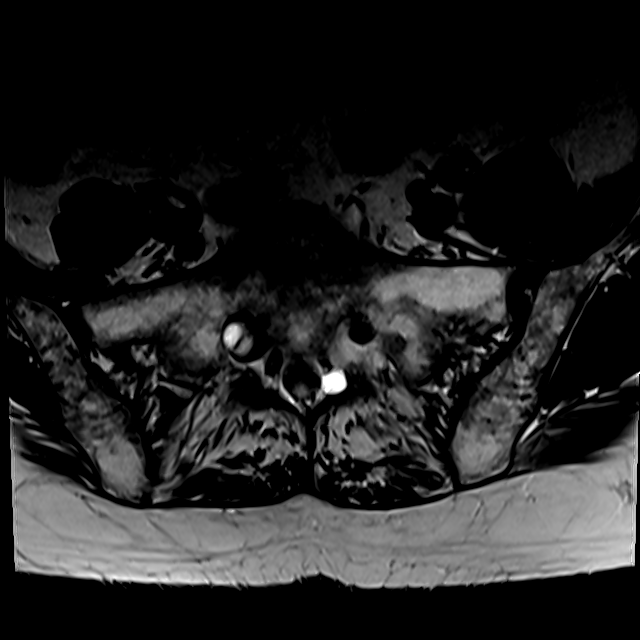
[im 6/36]
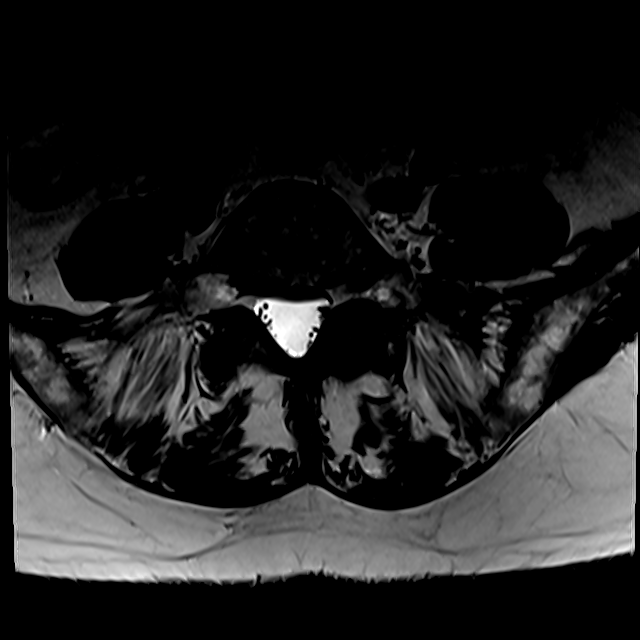
[im 11/36]
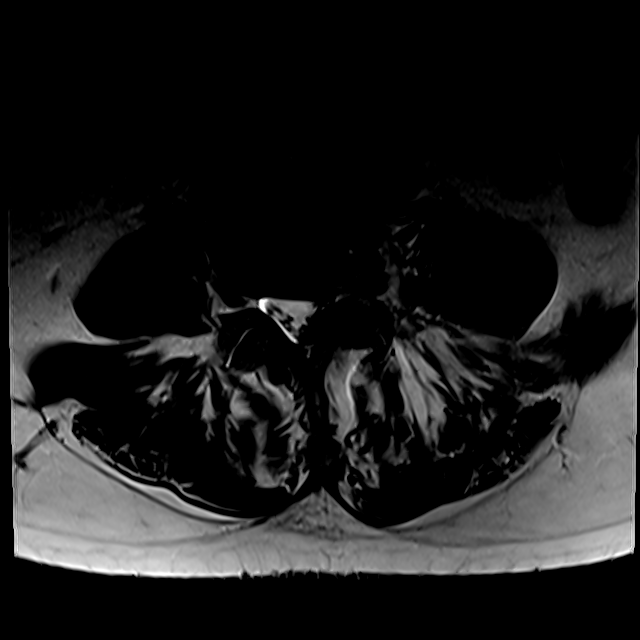
[im 19/36]
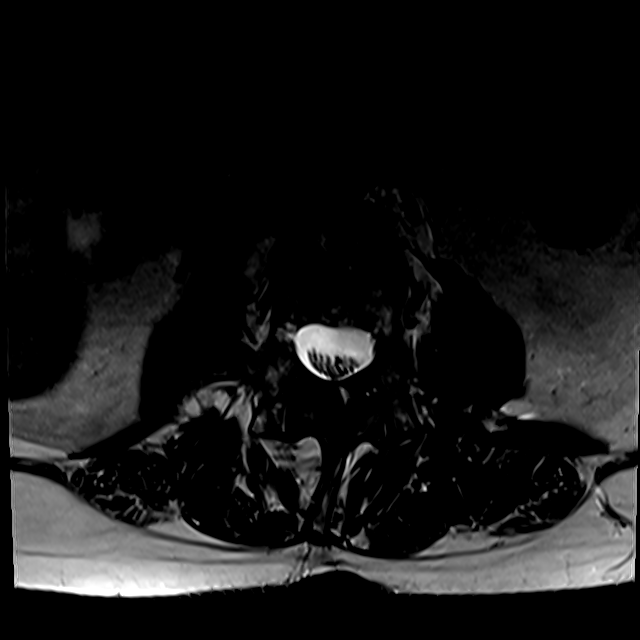
[im 30/36]
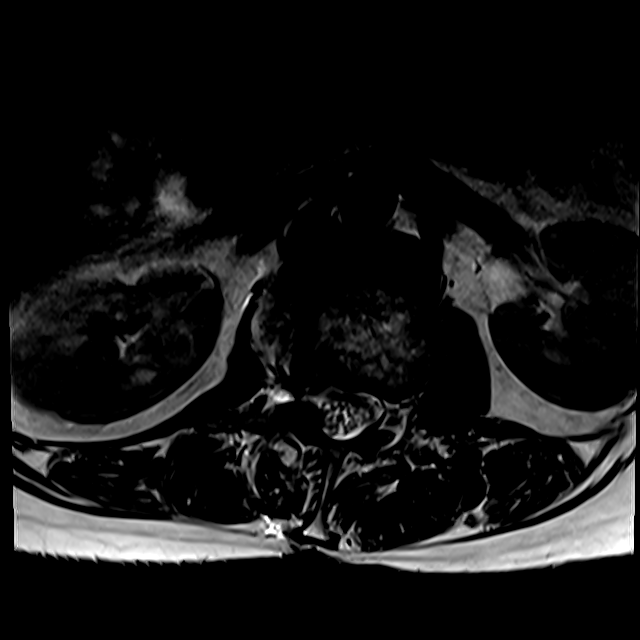

[17 of 48 positions shown; findings below may reference images not displayed]

FINDINGS: Segmentation: Normal lumbar segmentation is assumed with the lowest
fully formed disc space designated L5-S1.

Alignment: Mild-to-moderate thoracolumbar levoscoliosis. Exaggerated
lumbar lordosis. Unchanged grade 1 retrolisthesis of L1 on L2, L2 on
L3, and L3 on L4.

Vertebrae: Moderate diffuse bone marrow heterogeneity, unchanged and
nonspecific. No destructive bone lesion or acute fracture
identified. Increased right-sided degenerative endplate edema at
L1-2.

Conus medullaris and cauda equina: Conus extends to the L1 level.
Conus and cauda equina appear normal.

Paraspinal and other soft tissues: Unremarkable.

Disc levels:

Disc desiccation throughout the lumbar and included lower thoracic
spine with widespread disc space narrowing which is asymmetrically
advanced on the right at L1-2, L2-3, and L3-4 and on the left at
L4-5.

T10-11: Only imaged sagittally. Asymmetric left facet arthrosis
results in moderate left neural foraminal stenosis. No evidence of
significant spinal stenosis.

T11-12: Only imaged sagittally. Minimal disc bulging without
evidence of significant stenosis.

T12-L1: Mild disc bulging without stenosis, unchanged.

L1-2: Circumferential disc bulging, a new large right subarticular
disc extrusion with caudal migration to the mid L2 vertebral body
level, and moderate facet and ligamentum flavum hypertrophy result
in mild spinal stenosis, severe right lateral recess stenosis, and
mild right neural foraminal stenosis. Right L2 nerve root
impingement in the lateral recess.

L2-3: Disc bulging, a small central to left paracentral disc
extrusion with slight caudal migration, and moderate facet and
ligamentum flavum hypertrophy result in mild bilateral lateral
recess stenosis without significant spinal or neural foraminal
stenosis, unchanged.

L3-4: Disc bulging, endplate spurring, and moderate facet and
ligamentum flavum hypertrophy result in mild bilateral lateral
recess stenosis and mild-to-moderate right neural foraminal stenosis
without significant spinal stenosis, unchanged.

L4-5: Left eccentric disc bulging, a shallow left paracentral disc
protrusion, and mild right and moderate left facet and ligamentum
flavum hypertrophy result in mild-to-moderate left lateral recess
stenosis and mild-to-moderate left neural foraminal stenosis. The
disc protrusion is smaller than on the prior study with slightly
decreased left lateral recess stenosis. No spinal stenosis.

L5-S1: Mild-to-moderate left greater than right facet and ligamentum
flavum hypertrophy without stenosis, unchanged.
IMPRESSION: 1. New large disc extrusion at L1-2 resulting in severe right
lateral recess stenosis and right L2 nerve root impingement.
2. Decreased size of L4-5 disc protrusion with slightly decreased
left lateral recess stenosis.
3. Unchanged disc and facet degeneration elsewhere as above.

## 2020-12-25 DIAGNOSIS — M1712 Unilateral primary osteoarthritis, left knee: Secondary | ICD-10-CM | POA: Diagnosis not present

## 2020-12-29 ENCOUNTER — Other Ambulatory Visit: Payer: Self-pay

## 2020-12-30 MED ORDER — FENTANYL 25 MCG/HR TD PT72: MEDICATED_PATCH | TRANSDERMAL | 0 refills | Status: DC

## 2021-01-02 ENCOUNTER — Other Ambulatory Visit: Payer: Self-pay

## 2021-01-05 ENCOUNTER — Other Ambulatory Visit: Payer: Self-pay

## 2021-01-05 MED ORDER — AMPHETAMINE-DEXTROAMPHETAMINE 20 MG PO TABS: ORAL_TABLET | ORAL | 0 refills | Status: DC

## 2021-01-06 ENCOUNTER — Other Ambulatory Visit: Payer: Self-pay | Admitting: *Deleted

## 2021-01-06 MED ORDER — AMPHETAMINE-DEXTROAMPHETAMINE 20 MG PO TABS: ORAL_TABLET | ORAL | 0 refills | Status: DC

## 2021-01-07 DIAGNOSIS — M4150 Other secondary scoliosis, site unspecified: Secondary | ICD-10-CM | POA: Diagnosis not present

## 2021-01-07 DIAGNOSIS — M5416 Radiculopathy, lumbar region: Secondary | ICD-10-CM | POA: Diagnosis not present

## 2021-01-12 DIAGNOSIS — I10 Essential (primary) hypertension: Secondary | ICD-10-CM | POA: Diagnosis not present

## 2021-01-12 DIAGNOSIS — Z0000 Encounter for general adult medical examination without abnormal findings: Secondary | ICD-10-CM | POA: Diagnosis not present

## 2021-01-12 DIAGNOSIS — M545 Low back pain, unspecified: Secondary | ICD-10-CM | POA: Diagnosis not present

## 2021-01-12 DIAGNOSIS — G2581 Restless legs syndrome: Secondary | ICD-10-CM | POA: Diagnosis not present

## 2021-01-12 DIAGNOSIS — G35 Multiple sclerosis: Secondary | ICD-10-CM | POA: Diagnosis not present

## 2021-01-12 DIAGNOSIS — Z1389 Encounter for screening for other disorder: Secondary | ICD-10-CM | POA: Diagnosis not present

## 2021-01-15 DIAGNOSIS — H5213 Myopia, bilateral: Secondary | ICD-10-CM | POA: Diagnosis not present

## 2021-01-19 DIAGNOSIS — M4004 Postural kyphosis, thoracic region: Secondary | ICD-10-CM | POA: Diagnosis not present

## 2021-01-20 ENCOUNTER — Other Ambulatory Visit: Payer: Self-pay | Admitting: Neurology

## 2021-01-21 ENCOUNTER — Other Ambulatory Visit: Payer: Self-pay | Admitting: Internal Medicine

## 2021-01-21 DIAGNOSIS — M810 Age-related osteoporosis without current pathological fracture: Secondary | ICD-10-CM

## 2021-01-22 ENCOUNTER — Other Ambulatory Visit: Payer: Self-pay | Admitting: Neurology

## 2021-01-22 MED ORDER — FENTANYL 25 MCG/HR TD PT72: MEDICATED_PATCH | TRANSDERMAL | 0 refills | Status: DC

## 2021-01-22 NOTE — Telephone Encounter (Signed)
Received refill request for Fentanyl.  Last OV was on 09/03/20.  Next OV is scheduled for 02/23/21 .  Last RX was written on 12/30/20 for 10 patch.   [place] Drug Database has been reviewed.

## 2021-01-23 ENCOUNTER — Other Ambulatory Visit: Payer: Self-pay | Admitting: Neurology

## 2021-01-23 ENCOUNTER — Telehealth: Payer: Self-pay | Admitting: Neurology

## 2021-01-23 DIAGNOSIS — G35 Multiple sclerosis: Secondary | ICD-10-CM

## 2021-01-23 NOTE — Telephone Encounter (Addendum)
[facility] ([place])request approval for fentaNYL (Emma) 25 MCG/HR from physician to refill medication due to travel on 01/26/21.

## 2021-01-25 ENCOUNTER — Encounter: Payer: Self-pay | Admitting: Neurology

## 2021-01-26 NOTE — Telephone Encounter (Signed)
Refill has been sent for the patient to the pharmacy for the pt.

## 2021-02-01 ENCOUNTER — Other Ambulatory Visit: Payer: Self-pay | Admitting: Neurology

## 2021-02-02 MED ORDER — AMPHETAMINE-DEXTROAMPHETAMINE 20 MG PO TABS: ORAL_TABLET | ORAL | 0 refills | Status: DC

## 2021-02-02 NOTE — Telephone Encounter (Signed)
Received refill request for Adderall 20mg .  Last OV was on 09/03/20.  Next OV is scheduled for 02/23/21.  Last RX was written on 01/05/21 for 60 tabs.   [place] Drug Database has been reviewed.

## 2021-02-04 ENCOUNTER — Other Ambulatory Visit: Payer: Self-pay | Admitting: Neurological Surgery

## 2021-02-04 DIAGNOSIS — M8589 Other specified disorders of bone density and structure, multiple sites: Secondary | ICD-10-CM | POA: Diagnosis not present

## 2021-02-04 DIAGNOSIS — M4150 Other secondary scoliosis, site unspecified: Secondary | ICD-10-CM

## 2021-02-04 DIAGNOSIS — Z780 Asymptomatic menopausal state: Secondary | ICD-10-CM | POA: Diagnosis not present

## 2021-02-16 ENCOUNTER — Other Ambulatory Visit: Payer: Self-pay | Admitting: Neurology

## 2021-02-17 ENCOUNTER — Other Ambulatory Visit: Payer: Self-pay | Admitting: Neurology

## 2021-02-21 ENCOUNTER — Other Ambulatory Visit: Payer: Self-pay

## 2021-02-21 ENCOUNTER — Ambulatory Visit
Admission: RE | Admit: 2021-02-21 | Discharge: 2021-02-21 | Disposition: A | Discharge status: Home / Self Care | Payer: Medicare Other | Source: Ambulatory Visit | Attending: Neurological Surgery | Admitting: Neurological Surgery

## 2021-02-21 DIAGNOSIS — M4150 Other secondary scoliosis, site unspecified: Secondary | ICD-10-CM

## 2021-02-21 DIAGNOSIS — M549 Dorsalgia, unspecified: Secondary | ICD-10-CM | POA: Diagnosis not present

## 2021-02-21 IMAGING — MR MR THORACIC SPINE W/O CM
4 of 5 series · 20 of 48 positions shown · non-contrast
Comparison: None.

CLINICAL DATA: Chronic back pain and bilateral leg pain

EXAM:
MRI THORACIC SPINE WITHOUT CONTRAST
TECHNIQUE: Multiplanar, multisequence MR imaging of the thoracic spine was
performed. No intravenous contrast was administered.

[Series 4: STIR · sagittal · 3.0mm · 1.09mm/px · 3 of 21 slices shown]
[im 3/21]
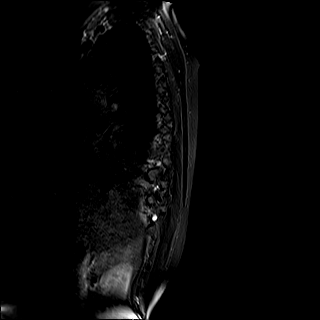
[im 12/21]
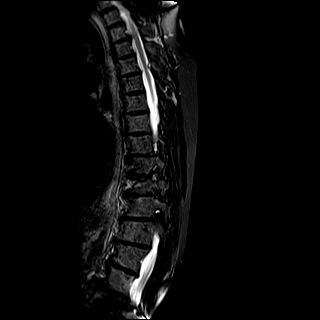
[im 18/21]
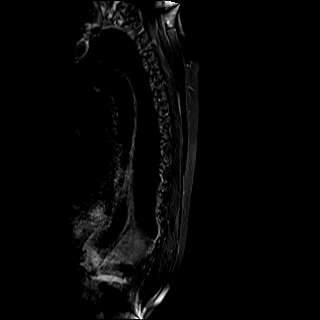

[Series 5: T2 · sagittal · 3.0mm · 0.68mm/px · 7 of 21 slices shown (1 of 2)]
[im 1/21]
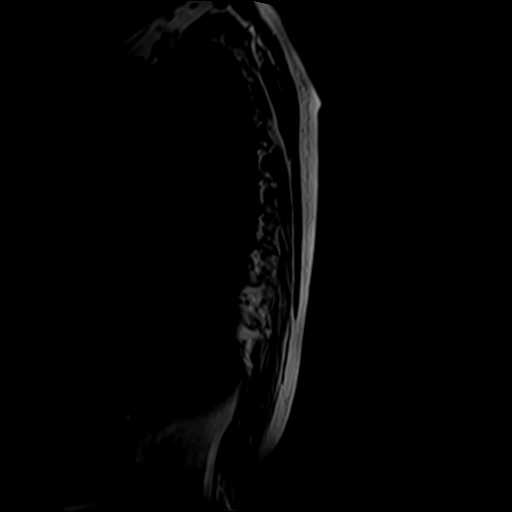
[im 4/21]
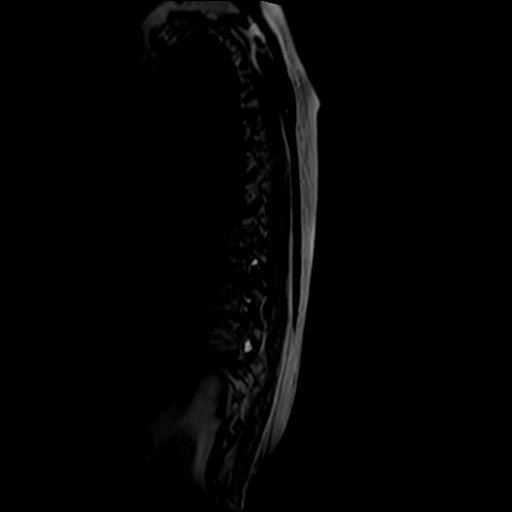
[im 7/21]
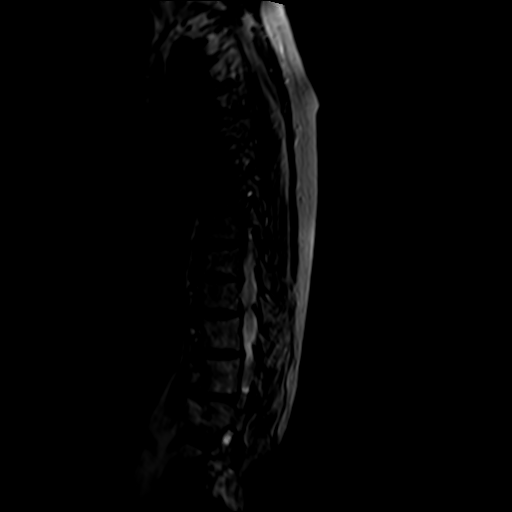
[im 11/21]
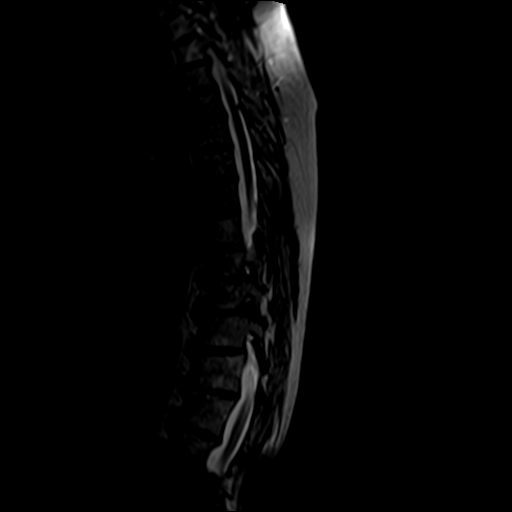
[im 14/21]
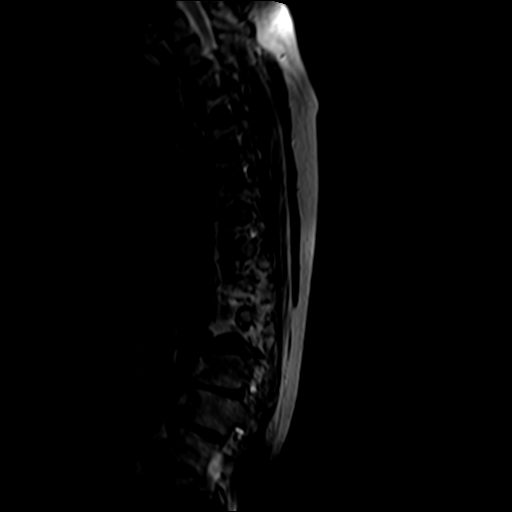
[im 17/21]
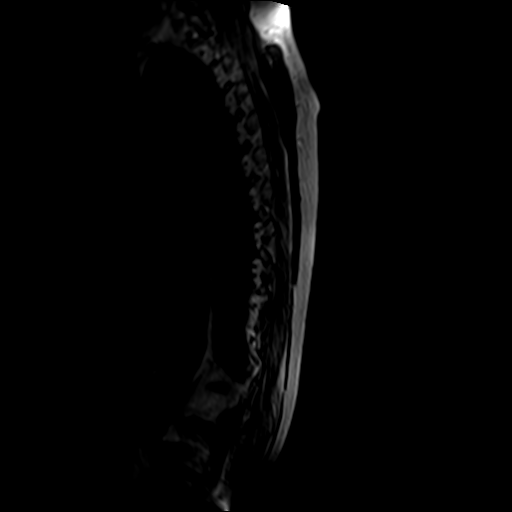
[im 21/21]
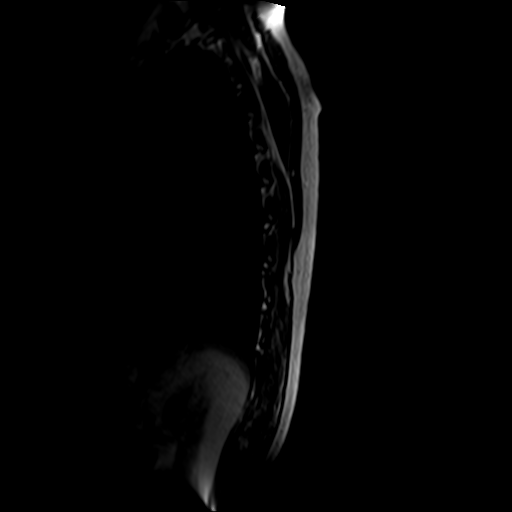

[Series 6: T1 · sagittal · 3.0mm · 0.55mm/px · 3 of 21 slices shown]
[im 4/21]
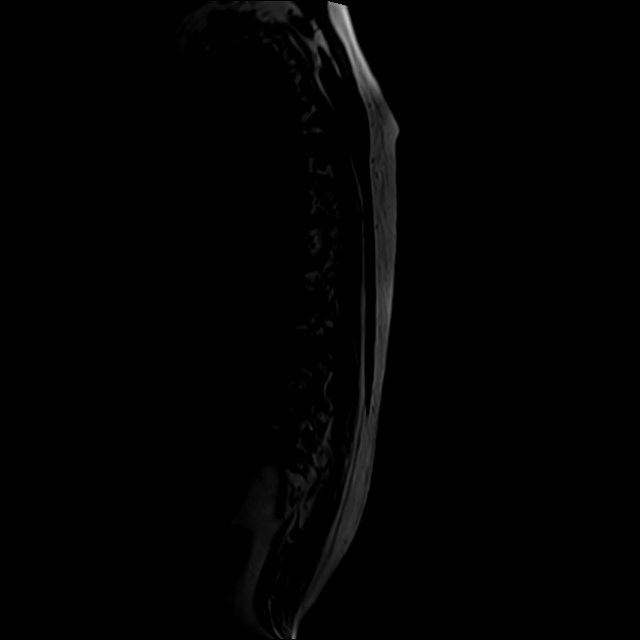
[im 11/21]
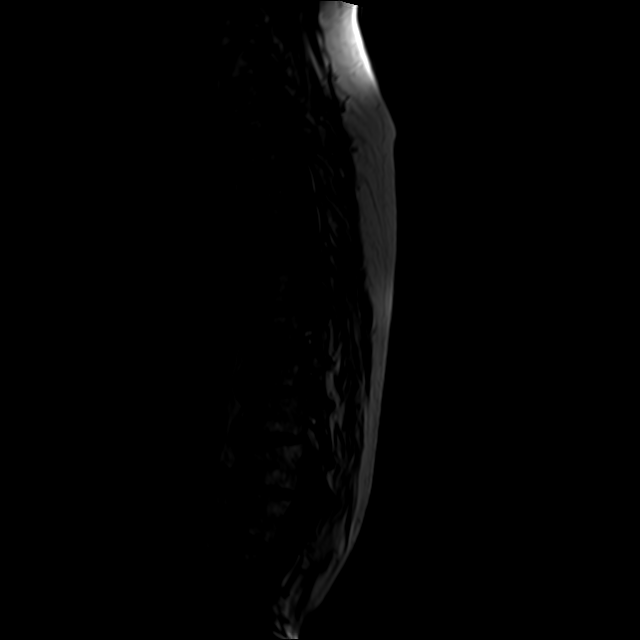
[im 17/21]
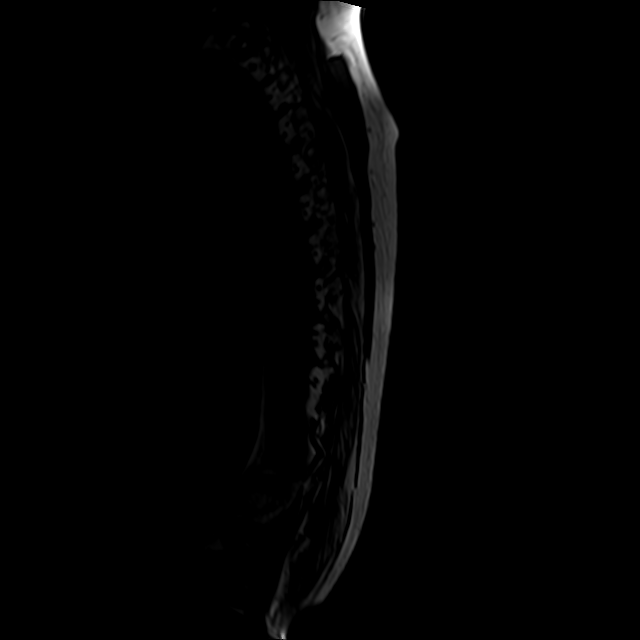

[Series 7: T2 · axial · 4.0mm · 0.39mm/px · z∈[-247,-66]mm · 7 of 39 slices shown (2 of 2)]
[im 1/39]
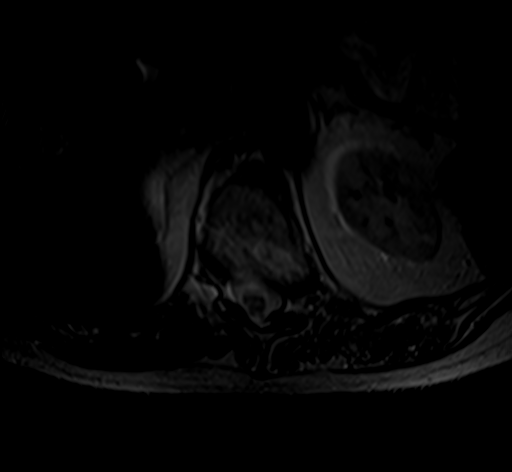
[im 7/39]
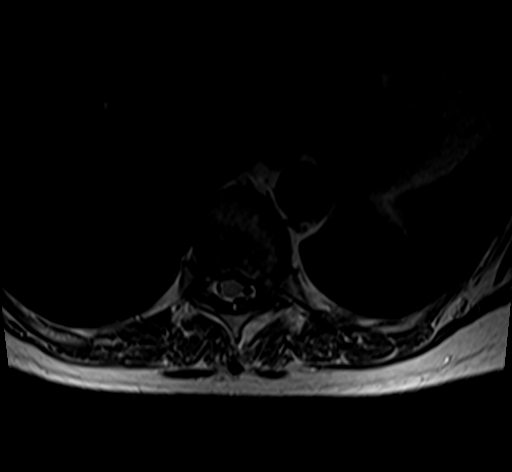
[im 13/39]
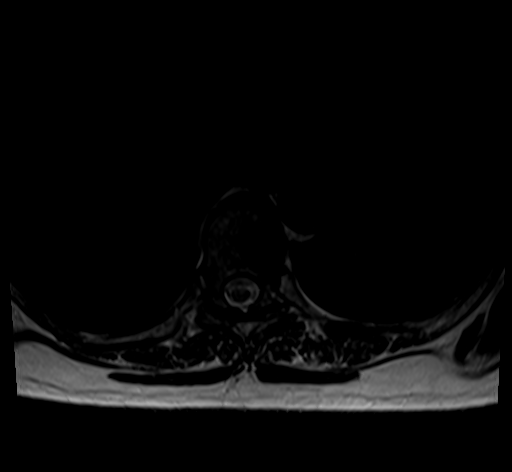
[im 16/39]
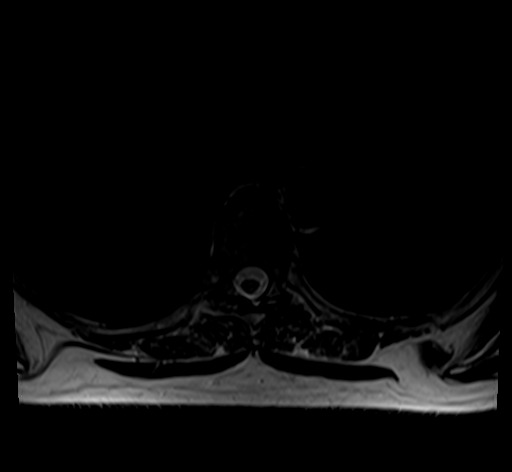
[im 20/39]
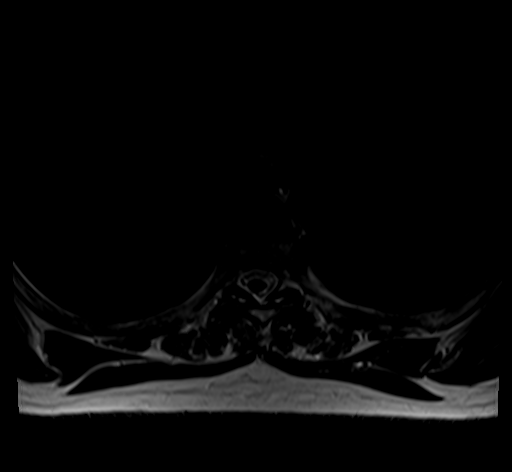
[im 23/39]
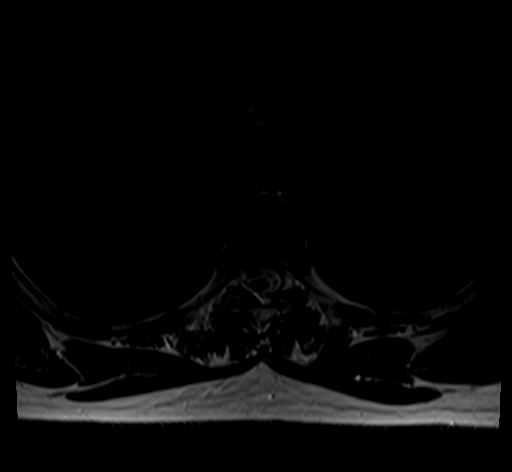
[im 32/39]
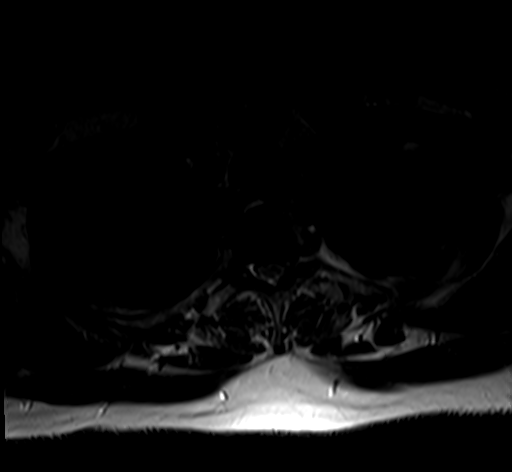

[20 of 48 positions shown; findings below may reference images not displayed]

FINDINGS: Alignment: Upper thoracic curvature convex to the left and lower
thoracic curvature convex to the right.

Vertebrae: No regional fracture or focal bone lesion.

Cord:  No cord compression or focal cord lesion.

Paraspinal and other soft tissues: Negative

Disc levels:

T1-2: Minimal noncompressive disc bulge.

T2-3: Shallow central disc protrusion narrows the ventral
subarachnoid space but does not compress the cord. Chronic
discogenic endplate marrow changes without active edema presently.

T3-4 through T8-9: Minimal disc bulges. No compressive stenosis.
Mild facet osteoarthritis.

T9-10: Shallow left paracentral disc protrusion indents the ventral
subarachnoid space but does not affect the cord or show foraminal
extension. Mild facet degeneration. Mild foraminal narrowing on the
left, not definitely compressive.

T10-11: Disc bulge. Facet degeneration more pronounced on the left.
Left foraminal narrowing could affect the left T10 nerve.

T11-12: Minimal disc bulge. Facet osteoarthritis. No canal stenosis.
Some foraminal narrowing on the left that could affect the T11
nerve.

T12-L1: Disc bulge.  No apparent compressive stenosis.
IMPRESSION: Thoracic scoliosis.  No evidence of fracture or focal bone lesion.

Chronic disc degeneration at T2-3 with a shallow central disc
protrusion that narrows the ventral subarachnoid space but does not
compress the cord. Chronic endplate marrow changes without evidence
of active edema.

Left foraminal narrowing at T9-10, T10-11 and T11-12 with some
potential to cause left-sided neural compression, particularly at
T10-11.

## 2021-02-23 ENCOUNTER — Other Ambulatory Visit: Payer: Self-pay

## 2021-02-23 ENCOUNTER — Ambulatory Visit: Payer: Medicare Other | Admitting: Neurology

## 2021-02-23 MED ORDER — DULOXETINE HCL 60 MG PO CPEP: ORAL_CAPSULE | ORAL | 0 refills | Status: DC

## 2021-02-24 ENCOUNTER — Other Ambulatory Visit: Payer: Self-pay | Admitting: Neurology

## 2021-02-25 MED ORDER — FENTANYL 25 MCG/HR TD PT72: MEDICATED_PATCH | TRANSDERMAL | 0 refills | Status: DC

## 2021-02-25 NOTE — Telephone Encounter (Signed)
Received refill request for Fentanyl.  Last OV was on 09/03/20.  Next OV is scheduled for 03/04/21 .  Last RX was written on 01/26/21 for 10 patches.   [place] Drug Database has been reviewed.  Please fill as work in [employer].

## 2021-03-03 ENCOUNTER — Other Ambulatory Visit: Payer: Self-pay | Admitting: Neurology

## 2021-03-03 MED ORDER — AMPHETAMINE-DEXTROAMPHETAMINE 20 MG PO TABS: ORAL_TABLET | ORAL | 0 refills | Status: DC

## 2021-03-03 NOTE — Telephone Encounter (Signed)
Received refill request for Adderall.  Last OV was on 09/03/20.  Next OV is scheduled for 03/04/21 .  Last RX was written on 02/04/21 for 60 tabs.  Changed fill date to 03/05/21  [place] Drug Database has been reviewed.

## 2021-03-04 ENCOUNTER — Ambulatory Visit: Payer: Medicare Other | Admitting: Neurology

## 2021-03-04 ENCOUNTER — Encounter: Payer: Self-pay | Admitting: Neurology

## 2021-03-04 VITALS — BP 126/70 | HR 91 | Ht 65.0 in | Wt 132.5 lb

## 2021-03-04 DIAGNOSIS — G35 Multiple sclerosis: Secondary | ICD-10-CM

## 2021-03-04 DIAGNOSIS — R260 Ataxic gait: Secondary | ICD-10-CM

## 2021-03-04 DIAGNOSIS — R208 Other disturbances of skin sensation: Secondary | ICD-10-CM

## 2021-03-04 DIAGNOSIS — Z79891 Long term (current) use of opiate analgesic: Secondary | ICD-10-CM | POA: Diagnosis not present

## 2021-03-04 DIAGNOSIS — R3915 Urgency of urination: Secondary | ICD-10-CM | POA: Diagnosis not present

## 2021-03-04 DIAGNOSIS — M5432 Sciatica, left side: Secondary | ICD-10-CM | POA: Diagnosis not present

## 2021-03-04 NOTE — Progress Notes (Signed)
[facility]  PATIENT: Sheri Burns DOB: May 14, 1955  REFERRING CLINICIAN: Thressa Sheller  HISTORY FROM: Patient REASON FOR VISIT: MS   HISTORICAL  CHIEF COMPLAINT:  Chief Complaint  Patient presents with   Follow-up    Rm 2, alone. Here for 6 month MS f/u, on leflunomide and tolerating well.     HISTORY OF PRESENT ILLNESS:  Sheri Burns is a 65 year old woman with MS.     Update 03/04/2021: She feels her MS is stable.   She is on leflunomide and tolerates it well.     Gait is doing well.   No change in strength, sensation.   Vision is fine.    Bladder has some urinary urgency helped by oxybutynin-XL 10 mg.    She no longer has incontinence.     She feels fatigue is better.   She is sleeping poorly, she feels mstly due mostly to leg pain.     Mood is doing well in general.  Her back is still bothering her.  She went to Neurosurgery (Dr. Annette Stable) and was referred to Dr, Maudie Mercury in [place].   She goes back next week.   A multi-level fusion from S1 to mid thoracic spine is panned to correct scoliosis/scoliosis and for protrusions.    The worse pain is on the left side and she notes myalgia there as well.   Pain was worse when her husband was alive as he was difficult to move around.   An ESI August 2021 with benefit.   She exercises and does exercises learned in PT.    She sees [facility].    Recent MRI showed multilevel DJD.      MS History:   She was diagnosed with multiple sclerosis more than 20 years ago. Initially, she was treated with Betaseron then Copaxone but has not been on any disease modifying therapy for about 8 years. During this time, she has had occasional  Exacerbations with more fatigue and gait issues.  She also has noted mild cognitive dysfunction worsening over the past 5 or 6 years. Her MRI of the brain performed 08/31/2012 shows foci that are predominantly periventricular. They are consistent with the diagnosis of multiple sclerosis.  When this MRI was compared to an MRI dated 10/12/2010, there was one additional small periventricular focus. There were no acute findings on either MRI.   In November 2015, she had an exacerbation with severe fatigue, worsened gait and dysphagia.    She started Aubagio 09/09/14.      IMAGING L-spine MRI (12/22/20 shows. Multiplevel DJD.   Changes comared to 2019 were:   Marland Kitchen New large disc extrusion at L1-2 resulting in severe right lateral recess stenosis and right L2 nerve root impingement. 2. Decreased size of L4-5 disc protrusion with slightly decreased left lateral recess stenosis.  MRI thoracic spine 12/22/2020 showed Thoracic scoliosis.  No evidence of fracture or focal bone lesion.    There was chronic disc degeneration at T2-3 with a shallow central disc protrusion that narrows the ventral subarachnoid space but does not compress the cord. Chronic endplate marrow changes without evidence of active edema.     Left foraminal narrowing at T9-10, T10-11 and T11-12 with some potential to cause left-sided neural compression, particularly at T10-11.    It does show significant degenerative changes at several levels.  At L4-L5 there appears to be a disc herniation towards the left that could be compressing the left L5 nerve root.  At L3-L4, there are  changes more to the right that could affect the right L4 nerve root.  There are also degenerative changes at L1-L2 and L2-L3 though there does not appear to be nerve root compression at those levels.  MRI brain 7/142021 shows multiple T2/FLAIR hyperintense foci in the hemispheres with a few punctate foci in the pons and cerebral hemispheres. The pattern is consistent with chronic demyelinating plaque associated with multiple sclerosis. None of the foci appears to be acute. Compared to the MRI dated 08/31/2016, there are no new lesions.  MRI 08/31/2016 showed multiple lesions in the hemispheres and pons but no new lesions compared to 10/31/2014.  MRI  10/31/2014 showed periventricular and deep white matter T2/flair hyperintense foci in a pattern and configuration consistent with multiple sclerosis. None of these foci enhanced after contrast administration.  There were no new lesions compared to 08/30/2012 MRI    OTHER  NCV/EMG 10/18/2017 1.    Moderately severe right median neuropathy at the wrist (carpal tunnel syndrome).   Chronic denervation changes were noted in the APB muscle. 2.    Borderline left median neuropathy at the wrist. 3.    There is no evidence of a significant cervical radiculopathy on the right. REVIEW OF SYSTEMS:  Constitutional: No fevers, chills, sweats, or change in appetite.  She notes a lot of fatigue. She is sleeping well. Eyes: No visual changes, double vision, eye pain Ear, nose and throat: No hearing loss, ear pain, nasal congestion, sore throat Cardiovascular: No chest pain, palpitations Respiratory:  No shortness of breath at rest or with exertion.   No wheezes GastrointestinaI: No nausea, vomiting, diarrhea.  Has constipation Genitourinary:  see above. Musculoskeletal:  reports pain in knees.   Reports neck pain and back pain Integumentary: No rash, pruritus, skin lesions Neurological: as above Psychiatric: Depression and  Anxiety noted Endocrine: No palpitations, diaphoresis, change in appetite, change in weigh or increased thirst Hematologic/Lymphatic:  No anemia, purpura, petechiae. Allergic/Immunologic: No itchy/runny eyes, nasal congestion, recent allergic reactions, rashes  ALLERGIES: Allergies  Allergen Reactions   Amoxicillin Hives and Itching   Codeine Nausea And Vomiting    vomiting   Demerol [Meperidine] Nausea And Vomiting   Ivp Dye [Iodinated Contrast Media] Hives   Penicillins Hives   Stadol [Butorphanol] Other (See Comments)    Per patient psychotic event   Sulfonamide Derivatives Hives   Clindamycin/Lincomycin Rash   Gadolinium Derivatives Hives    HOME MEDICATIONS: Outpatient  Medications Prior to Visit  Medication Sig Dispense Refill   acetaminophen (TYLENOL) 500 MG tablet Take 500 mg by mouth 4 (four) times daily.     amLODipine-valsartan (EXFORGE) 5-160 MG tablet Take 1 tablet by mouth daily.     amphetamine-dextroamphetamine (ADDERALL) 20 MG tablet TAKE 1 TABLET BY MOUTH 2 TIMES DAILY 60 tablet 0   [START ON 03/05/2021] amphetamine-dextroamphetamine (ADDERALL) 20 MG tablet TAKE 1 TABLET BY MOUTH 2 TIMES DAILY 60 tablet 0   Calcium Carb-Cholecalciferol (CALCIUM 600 + D PO) Take 1 tablet by mouth daily.     clonazePAM (KLONOPIN) 1 MG tablet TAKE 1 TABLET BY MOUTH 3 TIMES DAILY AS NEEDED 270 tablet 1   denosumab (PROLIA) 60 MG/ML SOSY injection Inject 60 mg into the skin every 6 (six) months. 1 each 1   DULoxetine (CYMBALTA) 60 MG capsule TAKE 1 CAPSULE BY MOUTH EVERY DAY 90 capsule 0   fentaNYL (DURAGESIC) 25 MCG/HR Place 1 patch (25 mcg total) onto the skin every 3 (three) days. 10 patch 0   gabapentin (  NEURONTIN) 600 MG tablet TAKE 1 TABLET BY MOUTH 4 TIMES DAILY 360 tablet 3   leflunomide (ARAVA) 20 MG tablet TAKE 1 TABLET BY MOUTH EVERY DAY 90 tablet 3   Multiple Vitamin (MULTIVITAMIN) tablet Take 1 tablet by mouth daily.     oxybutynin (DITROPAN-XL) 10 MG 24 hr tablet Take 1 tablet (10 mg total) by mouth daily. 90 tablet 3   traMADol (ULTRAM) 50 MG tablet TAKE 1 TABLET BY MOUTH EVERY 6 HOURS AS NEEDED (8am, 12 NOON, AT 4PM, AND 8pm) 120 tablet 3   scopolamine (TRANSDERM-SCOP, 1.5 MG,) 1 MG/3DAYS Place 1 patch (1.5 mg total) onto the skin every 3 (three) days. (Patient not taking: Reported on 09/17/2020) 5 patch 0   No facility-administered medications prior to visit.    PAST MEDICAL HISTORY: Past Medical History:  Diagnosis Date   Anxiety    Bulging lumbar disc 1980   Chronic pansinusitis    Colon polyp    Depression    GERD (gastroesophageal reflux disease)    Gynecological examination    sees Dr. Caren Griffins [place]    [id number](818.2)    Hypertension     Multiple sclerosis ([place]) since 1992   Dr. Arlice Colt in [facility]   Multiple thyroid nodules 1995   per pt. resolved on its own follwed by endo then released   Neurogenic bladder disorder 12/2009   w/ [facility] dx. Dr. McDiarmid   Neuropathy    Osteoarthritis    Osteoporosis    last DEXA 2018   Overactive bladder    sees Dr. Nicki Reaper McDiarmid    Pancreatitis 1977   Ulcerative proctitis ([place])    Vision abnormalities     PAST SURGICAL HISTORY: Past Surgical History:  Procedure Laterality Date   APPENDECTOMY     breast ruptured  left   ductal cyst   CARPAL TUNNEL RELEASE Right    CHOLECYSTECTOMY     COLONOSCOPY  01/10/09   repeat in 5 yrs Dr. Wilford Corner   DILATION AND CURETTAGE OF UTERUS     LAPAROSCOPY     x 3   left knee surgery  1978   to remove bone tumor     NASAL SINUS SURGERY Bilateral 01/13/2015   Procedure: ENDOSCOPIC SINUS SURGERY;  Surgeon: Izora Gala, MD;  Location: [facility];  Service: ENT;  Laterality: Bilateral;   NASAL SINUS SURGERY  05/2017   NASAL TURBINATE REDUCTION     pubo vaginal sling  10/2003   TONSILLECTOMY     vaginal cystocele repair     anterior   VAGINAL HYSTERECTOMY  1995   AUB & Endometriosis   widom teeth removal      FAMILY HISTORY: Family History  Problem Relation Age of Onset   Osteoporosis Mother        severe   Cancer Mother 26       colon /Breast also m. niece at 50's   Breast cancer Mother    Osteopenia Sister        X 2   Breast cancer Other     SOCIAL HISTORY:  Social History   Socioeconomic History   Marital status: Widowed    Spouse name: Jori Moll   Number of children: 1   Years of education: 16   Highest education level: Not on file  Occupational History   Occupation: BS Nursing    Employer: UNEMPLOYED   Occupation: [employer], occupational  Tobacco Use   Smoking status: Former  Packs/day: 1.00    Years: 5.00    Pack years: 5.00    Types: Cigarettes    Quit date: 03/09/1979     Years since quitting: 42.0   Smokeless tobacco: Never  Vaping Use   Vaping Use: Never used  Substance and Sexual Activity   Alcohol use: No   Drug use: No   Sexual activity: Not Currently    Partners: Male    Birth control/protection: Surgical  Other Topics Concern   Not on file  Social History Narrative   Not on file   Social Determinants of Health   Financial Resource Strain: Not on file  Food Insecurity: Not on file  Transportation Needs: Not on file  Physical Activity: Not on file  Stress: Not on file  Social Connections: Not on file  Intimate Partner Violence: Not on file     PHYSICAL EXAM  Vitals:   03/04/21 0921  BP: 126/70  Pulse: 91  SpO2: 97%  Weight: 132 lb 8 oz (60.1 kg)  Height: 5\' 5"  (1.651 m)    Body mass index is 22.05 kg/m.   General: The patient is well-developed and well-nourished and in no acute distress.     Neurologic Exam  Mental status: The patient is alert and oriented x 3 at the time of the examination. The patient has apparent normal recent and remote memory, with an apparently normal attention span and concentration ability.   Speech is normal.  Cranial nerves: Extraocular movements are full.  Facial strength and sensation is normal.  Trapezius strength is strong..  Motor:  Muscle bulk is normal and tone is increased in legs. Strength is  5 / 5  Sensory: She has numbness and allodynia in the left  L5 distribution.  .   Coordination: Cerebellar testing showed good heel to shin but and finger to nose  Gait and station: Station is stable.  Gait is mildly wide and arthritic.    Tandem gait is mildly wide  Romberg sign is negative.   Reflexes: Deep tendon reflexes are symmetric and fairly normal in the arms. However, she has increased reflexes in the legs with spread at the knees but no clonus in the ankles...    ______________________________   MULTIPLE SCLEROSIS  Chronic prescription opiate use  Sciatica of left  side  Urinary urgency  Ataxic gait  Dysesthesia  1.  Continue leflunomide for MS.  She does blood work with primary care.   2.   Continue fentanyl and robaxin for back pain/spasms.   3.   Try to stay active and exercise as tolerated 4.   She is on fentanyl patches and a prn clonazepam for pain and muscle spasticity and Adderall for attention deficit from her MS..  We had a discussion about the opiate  benzo combination having more risk than either alone.      The [place] controlled substance database has been reviewed.  She has been compliant and does not get medication from other doctors.  There is no escalation.      Hopefully, after her surgery (probably to be done in February 2023) - med's may also be changed while in therapy.  If not I will try to lower dose at next visit 5.    Return to see [place] in 4 months or sooner for new or worsening neurologic symptoms    Bless Lisenby A. Felecia Shelling, MD, PhD 34/74/2595, 63:87 PM Certified in Neurology, Clinical Neurophysiology, Sleep Medicine, Pain Medicine and Neuroimaging  [facility]  [address], [address], [place] [address] [phone]

## 2021-03-05 DIAGNOSIS — G8929 Other chronic pain: Secondary | ICD-10-CM | POA: Diagnosis not present

## 2021-03-05 DIAGNOSIS — F325 Major depressive disorder, single episode, in full remission: Secondary | ICD-10-CM | POA: Diagnosis not present

## 2021-03-05 DIAGNOSIS — M1712 Unilateral primary osteoarthritis, left knee: Secondary | ICD-10-CM | POA: Diagnosis not present

## 2021-03-05 DIAGNOSIS — I10 Essential (primary) hypertension: Secondary | ICD-10-CM | POA: Diagnosis not present

## 2021-03-26 ENCOUNTER — Other Ambulatory Visit: Payer: Self-pay | Admitting: Psychiatry

## 2021-03-26 MED ORDER — FENTANYL 25 MCG/HR TD PT72: MEDICATED_PATCH | TRANSDERMAL | 0 refills | Status: DC

## 2021-04-05 ENCOUNTER — Telehealth: Payer: Self-pay | Admitting: Neurology

## 2021-04-06 DIAGNOSIS — M4004 Postural kyphosis, thoracic region: Secondary | ICD-10-CM | POA: Diagnosis not present

## 2021-04-06 MED ORDER — AMPHETAMINE-DEXTROAMPHETAMINE 20 MG PO TABS: ORAL_TABLET | ORAL | 0 refills | Status: DC

## 2021-04-06 NOTE — Telephone Encounter (Signed)
Adderall 20mg  (generic) last filled 03/05/21 for #60.   Last visit: 03/04/21 Next visit: 08/05/21

## 2021-04-20 ENCOUNTER — Other Ambulatory Visit: Payer: Self-pay | Admitting: Neurology

## 2021-04-20 DIAGNOSIS — G35 Multiple sclerosis: Secondary | ICD-10-CM

## 2021-04-23 DIAGNOSIS — Z20822 Contact with and (suspected) exposure to covid-19: Secondary | ICD-10-CM | POA: Diagnosis not present

## 2021-04-28 DIAGNOSIS — G9609 Other spinal cerebrospinal fluid leak: Secondary | ICD-10-CM | POA: Diagnosis not present

## 2021-04-28 DIAGNOSIS — G970 Cerebrospinal fluid leak from spinal puncture: Secondary | ICD-10-CM | POA: Diagnosis not present

## 2021-04-28 DIAGNOSIS — M5136 Other intervertebral disc degeneration, lumbar region: Secondary | ICD-10-CM | POA: Diagnosis not present

## 2021-04-28 DIAGNOSIS — M40209 Unspecified kyphosis, site unspecified: Secondary | ICD-10-CM | POA: Diagnosis not present

## 2021-04-28 DIAGNOSIS — I10 Essential (primary) hypertension: Secondary | ICD-10-CM | POA: Diagnosis not present

## 2021-04-28 DIAGNOSIS — G9340 Encephalopathy, unspecified: Secondary | ICD-10-CM | POA: Diagnosis not present

## 2021-04-28 DIAGNOSIS — T8189XA Other complications of procedures, not elsewhere classified, initial encounter: Secondary | ICD-10-CM | POA: Diagnosis not present

## 2021-04-28 DIAGNOSIS — F32A Depression, unspecified: Secondary | ICD-10-CM | POA: Diagnosis not present

## 2021-04-28 DIAGNOSIS — G2581 Restless legs syndrome: Secondary | ICD-10-CM | POA: Diagnosis not present

## 2021-04-28 DIAGNOSIS — R519 Headache, unspecified: Secondary | ICD-10-CM | POA: Diagnosis not present

## 2021-04-28 DIAGNOSIS — R32 Unspecified urinary incontinence: Secondary | ICD-10-CM | POA: Diagnosis not present

## 2021-04-28 DIAGNOSIS — G9782 Other postprocedural complications and disorders of nervous system: Secondary | ICD-10-CM | POA: Diagnosis not present

## 2021-04-28 DIAGNOSIS — M40205 Unspecified kyphosis, thoracolumbar region: Secondary | ICD-10-CM | POA: Diagnosis not present

## 2021-04-28 DIAGNOSIS — E876 Hypokalemia: Secondary | ICD-10-CM | POA: Diagnosis not present

## 2021-04-28 DIAGNOSIS — Z7409 Other reduced mobility: Secondary | ICD-10-CM | POA: Diagnosis not present

## 2021-04-28 DIAGNOSIS — Z880 Allergy status to penicillin: Secondary | ICD-10-CM | POA: Diagnosis not present

## 2021-04-28 DIAGNOSIS — N319 Neuromuscular dysfunction of bladder, unspecified: Secondary | ICD-10-CM | POA: Diagnosis not present

## 2021-04-28 DIAGNOSIS — M47816 Spondylosis without myelopathy or radiculopathy, lumbar region: Secondary | ICD-10-CM | POA: Diagnosis not present

## 2021-04-28 DIAGNOSIS — M79661 Pain in right lower leg: Secondary | ICD-10-CM | POA: Diagnosis not present

## 2021-04-28 DIAGNOSIS — F419 Anxiety disorder, unspecified: Secondary | ICD-10-CM | POA: Diagnosis not present

## 2021-04-28 DIAGNOSIS — Z981 Arthrodesis status: Secondary | ICD-10-CM | POA: Diagnosis not present

## 2021-04-28 DIAGNOSIS — M79662 Pain in left lower leg: Secondary | ICD-10-CM | POA: Diagnosis not present

## 2021-04-28 DIAGNOSIS — Z01818 Encounter for other preprocedural examination: Secondary | ICD-10-CM | POA: Diagnosis not present

## 2021-04-28 DIAGNOSIS — M48061 Spinal stenosis, lumbar region without neurogenic claudication: Secondary | ICD-10-CM | POA: Diagnosis not present

## 2021-04-28 DIAGNOSIS — G35 Multiple sclerosis: Secondary | ICD-10-CM | POA: Diagnosis not present

## 2021-04-28 DIAGNOSIS — M4185 Other forms of scoliosis, thoracolumbar region: Secondary | ICD-10-CM | POA: Diagnosis not present

## 2021-04-28 DIAGNOSIS — Z87891 Personal history of nicotine dependence: Secondary | ICD-10-CM | POA: Diagnosis not present

## 2021-04-28 DIAGNOSIS — M419 Scoliosis, unspecified: Secondary | ICD-10-CM | POA: Diagnosis not present

## 2021-04-28 DIAGNOSIS — R509 Fever, unspecified: Secondary | ICD-10-CM | POA: Diagnosis not present

## 2021-04-28 DIAGNOSIS — M961 Postlaminectomy syndrome, not elsewhere classified: Secondary | ICD-10-CM | POA: Diagnosis not present

## 2021-04-28 DIAGNOSIS — M810 Age-related osteoporosis without current pathological fracture: Secondary | ICD-10-CM | POA: Diagnosis not present

## 2021-04-28 DIAGNOSIS — Z885 Allergy status to narcotic agent status: Secondary | ICD-10-CM | POA: Diagnosis not present

## 2021-05-16 DIAGNOSIS — K219 Gastro-esophageal reflux disease without esophagitis: Secondary | ICD-10-CM | POA: Diagnosis not present

## 2021-05-16 DIAGNOSIS — N319 Neuromuscular dysfunction of bladder, unspecified: Secondary | ICD-10-CM | POA: Diagnosis not present

## 2021-05-16 DIAGNOSIS — M810 Age-related osteoporosis without current pathological fracture: Secondary | ICD-10-CM | POA: Diagnosis not present

## 2021-05-16 DIAGNOSIS — G35 Multiple sclerosis: Secondary | ICD-10-CM | POA: Diagnosis not present

## 2021-05-16 DIAGNOSIS — M47816 Spondylosis without myelopathy or radiculopathy, lumbar region: Secondary | ICD-10-CM | POA: Diagnosis not present

## 2021-05-16 DIAGNOSIS — M4856XD Collapsed vertebra, not elsewhere classified, lumbar region, subsequent encounter for fracture with routine healing: Secondary | ICD-10-CM | POA: Diagnosis not present

## 2021-05-16 DIAGNOSIS — F32A Depression, unspecified: Secondary | ICD-10-CM | POA: Diagnosis not present

## 2021-05-16 DIAGNOSIS — G2581 Restless legs syndrome: Secondary | ICD-10-CM | POA: Diagnosis not present

## 2021-05-16 DIAGNOSIS — M48061 Spinal stenosis, lumbar region without neurogenic claudication: Secondary | ICD-10-CM | POA: Diagnosis not present

## 2021-05-16 DIAGNOSIS — I10 Essential (primary) hypertension: Secondary | ICD-10-CM | POA: Diagnosis not present

## 2021-05-16 DIAGNOSIS — Z4789 Encounter for other orthopedic aftercare: Secondary | ICD-10-CM | POA: Diagnosis not present

## 2021-05-16 DIAGNOSIS — F419 Anxiety disorder, unspecified: Secondary | ICD-10-CM | POA: Diagnosis not present

## 2021-05-16 DIAGNOSIS — Z8719 Personal history of other diseases of the digestive system: Secondary | ICD-10-CM | POA: Diagnosis not present

## 2021-05-16 DIAGNOSIS — G629 Polyneuropathy, unspecified: Secondary | ICD-10-CM | POA: Diagnosis not present

## 2021-05-16 DIAGNOSIS — N3281 Overactive bladder: Secondary | ICD-10-CM | POA: Diagnosis not present

## 2021-05-16 DIAGNOSIS — Z87891 Personal history of nicotine dependence: Secondary | ICD-10-CM | POA: Diagnosis not present

## 2021-06-08 DIAGNOSIS — M4004 Postural kyphosis, thoracic region: Secondary | ICD-10-CM | POA: Diagnosis not present

## 2021-06-15 ENCOUNTER — Other Ambulatory Visit: Payer: Self-pay | Admitting: Neurology

## 2021-06-15 NOTE — Telephone Encounter (Signed)
Last OV was on 03/04/21.  ?Next OV is scheduled for 07/26/21.  ?Last RX was written on 03/03/21 for 270 tabs.  ? ?[place] Drug Database has been reviewed.  ?

## 2021-06-16 DIAGNOSIS — I10 Essential (primary) hypertension: Secondary | ICD-10-CM | POA: Diagnosis not present

## 2021-06-16 DIAGNOSIS — K219 Gastro-esophageal reflux disease without esophagitis: Secondary | ICD-10-CM | POA: Diagnosis not present

## 2021-06-16 DIAGNOSIS — Z87891 Personal history of nicotine dependence: Secondary | ICD-10-CM | POA: Diagnosis not present

## 2021-06-16 DIAGNOSIS — M47816 Spondylosis without myelopathy or radiculopathy, lumbar region: Secondary | ICD-10-CM | POA: Diagnosis not present

## 2021-06-16 DIAGNOSIS — M4856XD Collapsed vertebra, not elsewhere classified, lumbar region, subsequent encounter for fracture with routine healing: Secondary | ICD-10-CM | POA: Diagnosis not present

## 2021-06-16 DIAGNOSIS — Z4789 Encounter for other orthopedic aftercare: Secondary | ICD-10-CM | POA: Diagnosis not present

## 2021-06-16 DIAGNOSIS — N319 Neuromuscular dysfunction of bladder, unspecified: Secondary | ICD-10-CM | POA: Diagnosis not present

## 2021-06-16 DIAGNOSIS — G629 Polyneuropathy, unspecified: Secondary | ICD-10-CM | POA: Diagnosis not present

## 2021-06-16 DIAGNOSIS — F419 Anxiety disorder, unspecified: Secondary | ICD-10-CM | POA: Diagnosis not present

## 2021-06-16 DIAGNOSIS — M810 Age-related osteoporosis without current pathological fracture: Secondary | ICD-10-CM | POA: Diagnosis not present

## 2021-06-16 DIAGNOSIS — G2581 Restless legs syndrome: Secondary | ICD-10-CM | POA: Diagnosis not present

## 2021-06-16 DIAGNOSIS — M48061 Spinal stenosis, lumbar region without neurogenic claudication: Secondary | ICD-10-CM | POA: Diagnosis not present

## 2021-06-16 DIAGNOSIS — G35 Multiple sclerosis: Secondary | ICD-10-CM | POA: Diagnosis not present

## 2021-06-16 DIAGNOSIS — Z8719 Personal history of other diseases of the digestive system: Secondary | ICD-10-CM | POA: Diagnosis not present

## 2021-06-16 DIAGNOSIS — F32A Depression, unspecified: Secondary | ICD-10-CM | POA: Diagnosis not present

## 2021-06-16 DIAGNOSIS — N3281 Overactive bladder: Secondary | ICD-10-CM | POA: Diagnosis not present

## 2021-06-19 ENCOUNTER — Other Ambulatory Visit: Payer: Self-pay | Admitting: Neurology

## 2021-06-22 ENCOUNTER — Other Ambulatory Visit: Payer: Self-pay | Admitting: Neurology

## 2021-06-22 ENCOUNTER — Telehealth: Payer: Self-pay | Admitting: Neurology

## 2021-06-22 MED ORDER — AMPHETAMINE-DEXTROAMPHETAMINE 20 MG PO TABS: ORAL_TABLET | ORAL | 0 refills | Status: DC

## 2021-06-22 NOTE — Telephone Encounter (Signed)
Last OV was on 03/04/21.  ?Next OV is scheduled for 08/05/21.  ?Last RX was written on 05/22/21 for 60 tabs.  ? ?[place] Drug Database has been reviewed.  ?

## 2021-06-22 NOTE — Telephone Encounter (Signed)
Called the pharmacy the script sent to them was for 1 tablet and directions take 1 tab BID. They asked a new script for 60 tab be sent over to reflect the 30 day supply ?

## 2021-06-22 NOTE — Telephone Encounter (Signed)
[facility] Colin Rhein) verify quanity and instructions for amphetamine-dextroamphetamine (ADDERALL) 20 MG tablet. Would like a call from the nurse. ?

## 2021-06-22 NOTE — Telephone Encounter (Signed)
The script was sent in for 1 tablet and need to changed. I will forward a new script ?Will send updated for 60 tab per 30 day to MD to send for pt ?

## 2021-06-22 NOTE — Telephone Encounter (Signed)
DUPLICATE REQUEST---PLEASE REFUSE ? ?ALREADY SENT REQUEST THIS AM FOR APPROVAL ?

## 2021-06-25 DIAGNOSIS — M48061 Spinal stenosis, lumbar region without neurogenic claudication: Secondary | ICD-10-CM | POA: Diagnosis not present

## 2021-06-25 DIAGNOSIS — M47816 Spondylosis without myelopathy or radiculopathy, lumbar region: Secondary | ICD-10-CM | POA: Diagnosis not present

## 2021-06-25 DIAGNOSIS — M4185 Other forms of scoliosis, thoracolumbar region: Secondary | ICD-10-CM | POA: Diagnosis not present

## 2021-07-03 DIAGNOSIS — M5451 Vertebrogenic low back pain: Secondary | ICD-10-CM | POA: Diagnosis not present

## 2021-07-03 DIAGNOSIS — M546 Pain in thoracic spine: Secondary | ICD-10-CM | POA: Diagnosis not present

## 2021-07-06 DIAGNOSIS — M5451 Vertebrogenic low back pain: Secondary | ICD-10-CM | POA: Diagnosis not present

## 2021-07-06 DIAGNOSIS — M546 Pain in thoracic spine: Secondary | ICD-10-CM | POA: Diagnosis not present

## 2021-07-08 DIAGNOSIS — M546 Pain in thoracic spine: Secondary | ICD-10-CM | POA: Diagnosis not present

## 2021-07-08 DIAGNOSIS — M5451 Vertebrogenic low back pain: Secondary | ICD-10-CM | POA: Diagnosis not present

## 2021-07-09 ENCOUNTER — Encounter: Payer: Self-pay | Admitting: Neurology

## 2021-07-09 DIAGNOSIS — M810 Age-related osteoporosis without current pathological fracture: Secondary | ICD-10-CM | POA: Diagnosis not present

## 2021-07-13 ENCOUNTER — Other Ambulatory Visit: Payer: Self-pay | Admitting: *Deleted

## 2021-07-13 DIAGNOSIS — M5451 Vertebrogenic low back pain: Secondary | ICD-10-CM | POA: Diagnosis not present

## 2021-07-13 DIAGNOSIS — M546 Pain in thoracic spine: Secondary | ICD-10-CM | POA: Diagnosis not present

## 2021-07-13 MED ORDER — TRAMADOL HCL 50 MG PO TABS: ORAL_TABLET | ORAL | 3 refills | Status: DC

## 2021-07-13 NOTE — Telephone Encounter (Signed)
Checked drug registry. She last refilled 04/18/21 #120. ?Last seen 03/04/21 and next f/u 08/05/21. ?

## 2021-07-15 DIAGNOSIS — M5451 Vertebrogenic low back pain: Secondary | ICD-10-CM | POA: Diagnosis not present

## 2021-07-15 DIAGNOSIS — M546 Pain in thoracic spine: Secondary | ICD-10-CM | POA: Diagnosis not present

## 2021-07-20 DIAGNOSIS — M546 Pain in thoracic spine: Secondary | ICD-10-CM | POA: Diagnosis not present

## 2021-07-20 DIAGNOSIS — M5451 Vertebrogenic low back pain: Secondary | ICD-10-CM | POA: Diagnosis not present

## 2021-07-21 ENCOUNTER — Other Ambulatory Visit: Payer: Self-pay | Admitting: Neurology

## 2021-07-21 MED ORDER — AMPHETAMINE-DEXTROAMPHETAMINE 20 MG PO TABS: ORAL_TABLET | ORAL | 0 refills | Status: DC

## 2021-07-21 NOTE — Telephone Encounter (Signed)
Last OV was on 03/04/21.  ?Next OV is scheduled for 08/05/21.  ?Last RX was written on 06/22/21 for 60 tabs.  ? ?[place] Drug Database has been reviewed.  ?

## 2021-07-22 DIAGNOSIS — M546 Pain in thoracic spine: Secondary | ICD-10-CM | POA: Diagnosis not present

## 2021-07-22 DIAGNOSIS — M5451 Vertebrogenic low back pain: Secondary | ICD-10-CM | POA: Diagnosis not present

## 2021-07-27 DIAGNOSIS — M5451 Vertebrogenic low back pain: Secondary | ICD-10-CM | POA: Diagnosis not present

## 2021-07-27 DIAGNOSIS — M546 Pain in thoracic spine: Secondary | ICD-10-CM | POA: Diagnosis not present

## 2021-07-30 DIAGNOSIS — M5451 Vertebrogenic low back pain: Secondary | ICD-10-CM | POA: Diagnosis not present

## 2021-07-30 DIAGNOSIS — M546 Pain in thoracic spine: Secondary | ICD-10-CM | POA: Diagnosis not present

## 2021-08-04 DIAGNOSIS — M5451 Vertebrogenic low back pain: Secondary | ICD-10-CM | POA: Diagnosis not present

## 2021-08-04 DIAGNOSIS — M546 Pain in thoracic spine: Secondary | ICD-10-CM | POA: Diagnosis not present

## 2021-08-05 ENCOUNTER — Encounter: Payer: Self-pay | Admitting: Neurology

## 2021-08-05 ENCOUNTER — Ambulatory Visit: Payer: Medicare Other | Admitting: Neurology

## 2021-08-05 VITALS — BP 128/68 | HR 90 | Ht 65.0 in | Wt 130.5 lb

## 2021-08-05 DIAGNOSIS — R3915 Urgency of urination: Secondary | ICD-10-CM | POA: Diagnosis not present

## 2021-08-05 DIAGNOSIS — R42 Dizziness and giddiness: Secondary | ICD-10-CM

## 2021-08-05 DIAGNOSIS — G35 Multiple sclerosis: Secondary | ICD-10-CM | POA: Diagnosis not present

## 2021-08-05 DIAGNOSIS — Z79899 Other long term (current) drug therapy: Secondary | ICD-10-CM

## 2021-08-05 DIAGNOSIS — R260 Ataxic gait: Secondary | ICD-10-CM | POA: Diagnosis not present

## 2021-08-05 MED ORDER — OXYBUTYNIN CHLORIDE ER 10 MG PO TB24: 10.0000 mg | ORAL_TABLET | Freq: Every day | ORAL | 3 refills | Status: DC

## 2021-08-05 NOTE — Progress Notes (Signed)
[facility]  PATIENT: Sheri Burns DOB: [birth year]  REFERRING CLINICIAN: Thressa Sheller  HISTORY FROM: Patient REASON FOR VISIT: MS   HISTORICAL  CHIEF COMPLAINT:  Chief Complaint  Patient presents with   Follow-up    RM 2, alone. Last seen 03/04/21. MS DMT: leflunomide. Reports she had 2 episodes of vertigo. One in August 2022 and again 04/2021. Episodes lasted about 4-5 hours. Both times, occurred when she woke up in the morning. Was well hydrated, unsure what caused episodes. 04/2021 had spinal fusion. Doing well since surgery. Still doing PT twice weekly/[facility].     HISTORY OF PRESENT ILLNESS:  Sheri Burns is a 66 year old woman with MS.     Update 08/05/2021: She woke up x 2 in August 2022 and Feb 2023 with rotational vertigo.  Gait was very off balaced ad she went back to bed.   Symptoms improved 4-5 hours later each time.     Changing position did not affect the vertigo.   No lightheadedness.   No preceding unusual positions, trauma or infection.   Uncertain if either ear had altered hearing.  No sensation of stuffiness.   She sometimes has tinnitus (no different during the episode).      She feels her MS is stable.   She is on leflunomide and tolerates it well.     Gait is doing well.   No change in strength, sensation.   Vision is fine.    Bladder has some urinary urgency helped by oxybutynin-XL 10 mg.    She no longer has incontinence.     She feels fatigue is better.   She is sleeping poorly, she feels mstly due mostly to leg pain.     Mood is doing well in general.   Adderall has helped her MS related ADD and fatigue.   .  She has anxiety and takes clonazepam 1 mg 3 times daily.  She had T7 to S1 fusion (Dr. Maudie Mercury in [place]) to correct scoliosis/scoliosis and for protrusions.    She needed a second operaio due to CSF leak and infection.  She feels she is recovering and pain is much better,   currently, the back pain and leg pain are  greatly improved and she was able to stop the fentanyl.  She still takes tramadol as needed.  MS History:   She was diagnosed with multiple sclerosis more than 20 years ago. Initially, she was treated with Betaseron then Copaxone but has not been on any disease modifying therapy for about 8 years. During this time, she has had occasional  Exacerbations with more fatigue and gait issues.  She also has noted mild cognitive dysfunction worsening over the past 5 or 6 years. Her MRI of the brain performed 08/31/2012 shows foci that are predominantly periventricular. They are consistent with the diagnosis of multiple sclerosis. When this MRI was compared to an MRI dated 10/12/2010, there was one additional small periventricular focus. There were no acute findings on either MRI.   In November 2015, she had an exacerbation with severe fatigue, worsened gait and dysphagia.    She started Aubagio 09/09/14.      IMAGING L-spine MRI (12/22/20 shows. Multiplevel DJD.   Changes comared to 2019 were:   Marland Kitchen New large disc extrusion at L1-2 resulting in severe right lateral recess stenosis and right L2 nerve root impingement. 2. Decreased size of L4-5 disc protrusion with slightly decreased left lateral recess stenosis.  MRI thoracic spine 12/22/2020 showed Thoracic  scoliosis.  No evidence of fracture or focal bone lesion.    There was chronic disc degeneration at T2-3 with a shallow central disc protrusion that narrows the ventral subarachnoid space but does not compress the cord. Chronic endplate marrow changes without evidence of active edema.     Left foraminal narrowing at T9-10, T10-11 and T11-12 with some potential to cause left-sided neural compression, particularly at T10-11.    It does show significant degenerative changes at several levels.  At L4-L5 there appears to be a disc herniation towards the left that could be compressing the left L5 nerve root.  At L3-L4, there are changes more to the right that  could affect the right L4 nerve root.  There are also degenerative changes at L1-L2 and L2-L3 though there does not appear to be nerve root compression at those levels.  MRI brain 7/142021 shows multiple T2/FLAIR hyperintense foci in the hemispheres with a few punctate foci in the pons and cerebral hemispheres. The pattern is consistent with chronic demyelinating plaque associated with multiple sclerosis. None of the foci appears to be acute. Compared to the MRI dated 08/31/2016, there are no new lesions.  MRI 08/31/2016 showed multiple lesions in the hemispheres and pons but no new lesions compared to 10/31/2014.  MRI 10/31/2014 showed periventricular and deep white matter T2/flair hyperintense foci in a pattern and configuration consistent with multiple sclerosis. None of these foci enhanced after contrast administration.  There were no new lesions compared to 08/30/2012 MRI    OTHER  NCV/EMG 10/18/2017 1.    Moderately severe right median neuropathy at the wrist (carpal tunnel syndrome).   Chronic denervation changes were noted in the APB muscle. 2.    Borderline left median neuropathy at the wrist. 3.    There is no evidence of a significant cervical radiculopathy on the right. REVIEW OF SYSTEMS:  Constitutional: No fevers, chills, sweats, or change in appetite.  She notes a lot of fatigue. She is sleeping well. Eyes: No visual changes, double vision, eye pain Ear, nose and throat: No hearing loss, ear pain, nasal congestion, sore throat Cardiovascular: No chest pain, palpitations Respiratory:  No shortness of breath at rest or with exertion.   No wheezes GastrointestinaI: No nausea, vomiting, diarrhea.  Has constipation Genitourinary:  see above. Musculoskeletal:  reports pain in knees.   Reports neck pain and back pain Integumentary: No rash, pruritus, skin lesions Neurological: as above Psychiatric: Depression and  Anxiety noted Endocrine: No palpitations, diaphoresis, change in appetite,  change in weigh or increased thirst Hematologic/Lymphatic:  No anemia, purpura, petechiae. Allergic/Immunologic: No itchy/runny eyes, nasal congestion, recent allergic reactions, rashes  ALLERGIES: Allergies  Allergen Reactions   Amoxicillin Hives and Itching   Codeine Nausea And Vomiting    vomiting   Demerol [Meperidine] Nausea And Vomiting   Ivp Dye [Iodinated Contrast Media] Hives   Penicillins Hives   Stadol [Butorphanol] Other (See Comments)    Per patient psychotic event   Sulfonamide Derivatives Hives   Clindamycin/Lincomycin Rash   Gadolinium Derivatives Hives    HOME MEDICATIONS: Outpatient Medications Prior to Visit  Medication Sig Dispense Refill   acetaminophen (TYLENOL) 500 MG tablet Take 500 mg by mouth 4 (four) times daily.     amLODipine-valsartan (EXFORGE) 5-160 MG tablet Take 1 tablet by mouth daily.     amphetamine-dextroamphetamine (ADDERALL) 20 MG tablet TAKE 1 TABLET BY MOUTH 2 TIMES DAILY 60 tablet 0   Calcium Carb-Cholecalciferol (CALCIUM 600 + D PO) Take 1 tablet  by mouth daily.     clonazePAM (KLONOPIN) 1 MG tablet TAKE 1 TABLET BY MOUTH 3 TIMES DAILY AS NEEDED 270 tablet 1   denosumab (PROLIA) 60 MG/ML SOSY injection Inject 60 mg into the skin every 6 (six) months. 1 each 1   DULoxetine (CYMBALTA) 60 MG capsule TAKE 1 CAPSULE BY MOUTH EVERY DAY 90 capsule 0   gabapentin (NEURONTIN) 600 MG tablet TAKE 1 TABLET BY MOUTH 4 TIMES DAILY 360 tablet 3   leflunomide (ARAVA) 20 MG tablet TAKE 1 TABLET BY MOUTH EVERY DAY 90 tablet 3   Multiple Vitamin (MULTIVITAMIN) tablet Take 1 tablet by mouth daily.     oxybutynin (DITROPAN-XL) 10 MG 24 hr tablet Take 1 tablet (10 mg total) by mouth daily. 90 tablet 3   traMADol (ULTRAM) 50 MG tablet TAKE 1 TABLET BY MOUTH EVERY 6 HOURS AS NEEDED (8am, 12 NOON, AT 4PM, AND 8pm) 120 tablet 3   fentaNYL (DURAGESIC) 25 MCG/HR Place 1 patch (25 mcg total) onto the skin every 3 (three) days. 10 patch 0   No facility-administered  medications prior to visit.    PAST MEDICAL HISTORY: Past Medical History:  Diagnosis Date   Anxiety    Bulging lumbar disc 1980   Chronic pansinusitis    Colon polyp    Depression    GERD (gastroesophageal reflux disease)    Gynecological examination    sees Dr. Caren Griffins [place]    [id number](643.3)    Hypertension    Multiple sclerosis ([place]) since 1992   [facility]. Arlice Colt in [facility]   Multiple thyroid nodules 1995   per pt. resolved on its own follwed by endo then released   Neurogenic bladder disorder 12/2009   w/ urge & stress inc. dx. Dr. McDiarmid   Neuropathy    Osteoarthritis    Osteoporosis    last DEXA 2018   Overactive bladder    sees Dr. Nicki Reaper McDiarmid    Pancreatitis 1977   Ulcerative proctitis ([place])    Vision abnormalities     PAST SURGICAL HISTORY: Past Surgical History:  Procedure Laterality Date   APPENDECTOMY     breast ruptured  left   ductal cyst   CARPAL TUNNEL RELEASE Right    CHOLECYSTECTOMY     COLONOSCOPY  01/10/09   repeat in 5 yrs Dr. Wilford Corner   DILATION AND CURETTAGE OF UTERUS     LAPAROSCOPY     x 3   left knee surgery  1978   to remove bone tumor     NASAL SINUS SURGERY Bilateral 01/13/2015   Procedure: ENDOSCOPIC SINUS SURGERY;  Surgeon: Izora Gala, MD;  Location: [facility];  Service: ENT;  Laterality: Bilateral;   NASAL SINUS SURGERY  05/2017   NASAL TURBINATE REDUCTION     pubo vaginal sling  10/2003   TONSILLECTOMY     vaginal cystocele repair     anterior   VAGINAL HYSTERECTOMY  1995   AUB & Endometriosis   widom teeth removal      FAMILY HISTORY: Family History  Problem Relation Age of Onset   Osteoporosis Mother        severe   Cancer Mother 84       colon /Breast also m. niece at 65's   Breast cancer Mother    Osteopenia Sister        X 2   Breast cancer Other     SOCIAL HISTORY:  Social History   Socioeconomic History  Marital status: Widowed    Spouse name: Jori Moll   Number  of children: 1   Years of education: 16   Highest education level: Not on file  Occupational History   Occupation: BS Nursing    Employer: UNEMPLOYED   Occupation: [employer], occupational  Tobacco Use   Smoking status: Former    Packs/day: 1.00    Years: 5.00    Pack years: 5.00    Types: Cigarettes    Quit date: 03/09/1979    Years since quitting: 42.4   Smokeless tobacco: Never  Vaping Use   Vaping Use: Never used  Substance and Sexual Activity   Alcohol use: No   Drug use: No   Sexual activity: Not Currently    Partners: Male    Birth control/protection: Surgical  Other Topics Concern   Not on file  Social History Narrative   Not on file   Social Determinants of Health   Financial Resource Strain: Not on file  Food Insecurity: Not on file  Transportation Needs: Not on file  Physical Activity: Not on file  Stress: Not on file  Social Connections: Not on file  Intimate Partner Violence: Not on file     PHYSICAL EXAM  Vitals:   08/05/21 0956  BP: 128/68  Pulse: 90  Weight: 130 lb 8 oz (59.2 kg)  Height: '5\' 5"'$  (1.651 m)    Body mass index is 21.72 kg/m.   General: The patient is well-developed and well-nourished and in no acute distress.     Neurologic Exam  Mental status: The patient is alert and oriented x 3 at the time of the examination. The patient has apparent normal recent and remote memory, with an apparently normal attention span and concentration ability.   Speech is normal.  Cranial nerves: Extraocular movements are full.  Facial strength and sensation is normal.  Trapezius strength is strong..  Motor:  Muscle bulk is normal and tone is increased in legs. Strength is  5 / 5  Sensory: She has numbness and allodynia in the left  L5 distribution.  .   Coordination: Cerebellar testing showed good heel to shin but and finger to nose  Gait and station: Station is stable.  Gait is mildly wide and arthritic.    Tandem gait is mildly wide.  The Romberg is  negative..   Reflexes: Deep tendon reflexes are symmetric and fairly normal in the arms. However, she has increased reflexes in the legs with spread at the knees but no clonus in the ankles...    ______________________________   MULTIPLE SCLEROSIS  Ataxic gait  Urinary urgency  High risk medication use  Vertigo   1.   Continue leflunomide for MS.  She does blood work with primary care.   2.   Take a clonazepam if vertigo recurs.  She will put 1 or 2 pills by the bedside so she can take more easily. 3.   Try to stay active and exercise as tolerated 4.   She was able to d/c fentanyl as pain is much better.  She is still on prn trmaadol 5.   Return to see [place] in 6 months or sooner for new or worsening neurologic symptoms    Starkisha Tullis A. Felecia Shelling, MD, PhD 2/44/0102, 72:53 AM Certified in Neurology, Clinical Neurophysiology, Sleep Medicine, Pain Medicine and Neuroimaging  [facility] [address] [place], [place] [address] [phone]

## 2021-08-06 DIAGNOSIS — M546 Pain in thoracic spine: Secondary | ICD-10-CM | POA: Diagnosis not present

## 2021-08-06 DIAGNOSIS — M5451 Vertebrogenic low back pain: Secondary | ICD-10-CM | POA: Diagnosis not present

## 2021-08-11 DIAGNOSIS — M5451 Vertebrogenic low back pain: Secondary | ICD-10-CM | POA: Diagnosis not present

## 2021-08-11 DIAGNOSIS — M546 Pain in thoracic spine: Secondary | ICD-10-CM | POA: Diagnosis not present

## 2021-08-14 DIAGNOSIS — M5451 Vertebrogenic low back pain: Secondary | ICD-10-CM | POA: Diagnosis not present

## 2021-08-14 DIAGNOSIS — M546 Pain in thoracic spine: Secondary | ICD-10-CM | POA: Diagnosis not present

## 2021-08-18 DIAGNOSIS — M5451 Vertebrogenic low back pain: Secondary | ICD-10-CM | POA: Diagnosis not present

## 2021-08-18 DIAGNOSIS — M546 Pain in thoracic spine: Secondary | ICD-10-CM | POA: Diagnosis not present

## 2021-08-20 ENCOUNTER — Other Ambulatory Visit: Payer: Self-pay | Admitting: Neurology

## 2021-08-20 DIAGNOSIS — M5451 Vertebrogenic low back pain: Secondary | ICD-10-CM | POA: Diagnosis not present

## 2021-08-20 DIAGNOSIS — M546 Pain in thoracic spine: Secondary | ICD-10-CM | POA: Diagnosis not present

## 2021-08-21 ENCOUNTER — Other Ambulatory Visit: Payer: Self-pay | Admitting: Neurology

## 2021-08-24 ENCOUNTER — Encounter: Payer: Self-pay | Admitting: Neurology

## 2021-08-24 ENCOUNTER — Other Ambulatory Visit: Payer: Self-pay | Admitting: *Deleted

## 2021-08-24 NOTE — Telephone Encounter (Signed)
Patient is up to date on her appointments and is due for a refill on adderall. [place] Controlled Substance Registry checked and is appropriate.

## 2021-08-25 DIAGNOSIS — M546 Pain in thoracic spine: Secondary | ICD-10-CM | POA: Diagnosis not present

## 2021-08-25 DIAGNOSIS — M5451 Vertebrogenic low back pain: Secondary | ICD-10-CM | POA: Diagnosis not present

## 2021-08-27 DIAGNOSIS — M546 Pain in thoracic spine: Secondary | ICD-10-CM | POA: Diagnosis not present

## 2021-08-27 DIAGNOSIS — M5451 Vertebrogenic low back pain: Secondary | ICD-10-CM | POA: Diagnosis not present

## 2021-08-31 ENCOUNTER — Other Ambulatory Visit: Payer: Self-pay | Admitting: Family Medicine

## 2021-08-31 DIAGNOSIS — M5451 Vertebrogenic low back pain: Secondary | ICD-10-CM | POA: Diagnosis not present

## 2021-08-31 DIAGNOSIS — M546 Pain in thoracic spine: Secondary | ICD-10-CM | POA: Diagnosis not present

## 2021-09-02 MED ORDER — DULOXETINE HCL 60 MG PO CPEP: ORAL_CAPSULE | ORAL | 3 refills | Status: DC

## 2021-09-03 DIAGNOSIS — M5451 Vertebrogenic low back pain: Secondary | ICD-10-CM | POA: Diagnosis not present

## 2021-09-03 DIAGNOSIS — M546 Pain in thoracic spine: Secondary | ICD-10-CM | POA: Diagnosis not present

## 2021-09-07 DIAGNOSIS — M546 Pain in thoracic spine: Secondary | ICD-10-CM | POA: Diagnosis not present

## 2021-09-07 DIAGNOSIS — M5451 Vertebrogenic low back pain: Secondary | ICD-10-CM | POA: Diagnosis not present

## 2021-09-11 DIAGNOSIS — M5451 Vertebrogenic low back pain: Secondary | ICD-10-CM | POA: Diagnosis not present

## 2021-09-11 DIAGNOSIS — M546 Pain in thoracic spine: Secondary | ICD-10-CM | POA: Diagnosis not present

## 2021-09-14 DIAGNOSIS — M5451 Vertebrogenic low back pain: Secondary | ICD-10-CM | POA: Diagnosis not present

## 2021-09-14 DIAGNOSIS — M546 Pain in thoracic spine: Secondary | ICD-10-CM | POA: Diagnosis not present

## 2021-09-15 ENCOUNTER — Other Ambulatory Visit: Payer: Self-pay | Admitting: Neurology

## 2021-09-17 ENCOUNTER — Other Ambulatory Visit: Payer: Self-pay | Admitting: Neurology

## 2021-09-17 DIAGNOSIS — M546 Pain in thoracic spine: Secondary | ICD-10-CM | POA: Diagnosis not present

## 2021-09-17 DIAGNOSIS — M5451 Vertebrogenic low back pain: Secondary | ICD-10-CM | POA: Diagnosis not present

## 2021-09-17 MED ORDER — AMPHETAMINE-DEXTROAMPHETAMINE 20 MG PO TABS: 20.0000 mg | ORAL_TABLET | Freq: Two times a day (BID) | ORAL | 0 refills | Status: DC

## 2021-09-17 NOTE — Telephone Encounter (Signed)
Last OV was on 08/05/21.  Next OV is scheduled for 02/10/22.  Last RX was written on 08/24/21 for 60 tabs.   [place] Drug Database has been reviewed.

## 2021-09-21 DIAGNOSIS — M546 Pain in thoracic spine: Secondary | ICD-10-CM | POA: Diagnosis not present

## 2021-09-21 DIAGNOSIS — M5451 Vertebrogenic low back pain: Secondary | ICD-10-CM | POA: Diagnosis not present

## 2021-09-23 DIAGNOSIS — M5451 Vertebrogenic low back pain: Secondary | ICD-10-CM | POA: Diagnosis not present

## 2021-09-23 DIAGNOSIS — M546 Pain in thoracic spine: Secondary | ICD-10-CM | POA: Diagnosis not present

## 2021-09-24 DIAGNOSIS — M4004 Postural kyphosis, thoracic region: Secondary | ICD-10-CM | POA: Diagnosis not present

## 2021-09-30 DIAGNOSIS — M546 Pain in thoracic spine: Secondary | ICD-10-CM | POA: Diagnosis not present

## 2021-09-30 DIAGNOSIS — M5451 Vertebrogenic low back pain: Secondary | ICD-10-CM | POA: Diagnosis not present

## 2021-10-02 ENCOUNTER — Other Ambulatory Visit: Payer: Self-pay | Admitting: Internal Medicine

## 2021-10-02 DIAGNOSIS — Z1231 Encounter for screening mammogram for malignant neoplasm of breast: Secondary | ICD-10-CM

## 2021-10-02 DIAGNOSIS — M546 Pain in thoracic spine: Secondary | ICD-10-CM | POA: Diagnosis not present

## 2021-10-02 DIAGNOSIS — M5451 Vertebrogenic low back pain: Secondary | ICD-10-CM | POA: Diagnosis not present

## 2021-10-05 DIAGNOSIS — M5451 Vertebrogenic low back pain: Secondary | ICD-10-CM | POA: Diagnosis not present

## 2021-10-05 DIAGNOSIS — M546 Pain in thoracic spine: Secondary | ICD-10-CM | POA: Diagnosis not present

## 2021-10-08 DIAGNOSIS — M546 Pain in thoracic spine: Secondary | ICD-10-CM | POA: Diagnosis not present

## 2021-10-08 DIAGNOSIS — M5451 Vertebrogenic low back pain: Secondary | ICD-10-CM | POA: Diagnosis not present

## 2021-10-09 ENCOUNTER — Other Ambulatory Visit: Payer: Self-pay | Admitting: Neurology

## 2021-10-09 NOTE — Telephone Encounter (Signed)
Last OV was on 08/05/21.  Next OV is scheduled for 02/10/22.  Last RX was written on 09/10/21 for 120 tabs.   [place] Drug Database has been reviewed.

## 2021-10-12 ENCOUNTER — Other Ambulatory Visit: Payer: Self-pay

## 2021-10-12 DIAGNOSIS — M5451 Vertebrogenic low back pain: Secondary | ICD-10-CM | POA: Diagnosis not present

## 2021-10-12 DIAGNOSIS — M546 Pain in thoracic spine: Secondary | ICD-10-CM | POA: Diagnosis not present

## 2021-10-12 MED ORDER — TRAMADOL HCL 50 MG PO TABS: ORAL_TABLET | ORAL | 3 refills | Status: DC

## 2021-10-12 NOTE — Progress Notes (Signed)
Last OV was on 08/05/21.  Next OV is scheduled for 02/10/22.  Last RX was written on 09/10/21 for 120 tabs.   [place] Drug Database has been reviewed

## 2021-10-14 DIAGNOSIS — M5451 Vertebrogenic low back pain: Secondary | ICD-10-CM | POA: Diagnosis not present

## 2021-10-14 DIAGNOSIS — M546 Pain in thoracic spine: Secondary | ICD-10-CM | POA: Diagnosis not present

## 2021-10-19 ENCOUNTER — Other Ambulatory Visit: Payer: Self-pay | Admitting: Neurology

## 2021-10-20 NOTE — Telephone Encounter (Signed)
Last OV was on 08/05/21.  Next OV is scheduled for 02/10/22.  Last RX was written on 09/21/21 for 60 tabs.   [place] Drug Database has been reviewed.

## 2021-10-21 DIAGNOSIS — M546 Pain in thoracic spine: Secondary | ICD-10-CM | POA: Diagnosis not present

## 2021-10-21 DIAGNOSIS — M5451 Vertebrogenic low back pain: Secondary | ICD-10-CM | POA: Diagnosis not present

## 2021-10-21 MED ORDER — AMPHETAMINE-DEXTROAMPHETAMINE 20 MG PO TABS
20.0000 mg | ORAL_TABLET | Freq: Two times a day (BID) | ORAL | 0 refills | Status: DC
Start: 2021-10-21 — End: 2021-11-16

## 2021-10-23 DIAGNOSIS — M5451 Vertebrogenic low back pain: Secondary | ICD-10-CM | POA: Diagnosis not present

## 2021-10-23 DIAGNOSIS — M546 Pain in thoracic spine: Secondary | ICD-10-CM | POA: Diagnosis not present

## 2021-11-05 DIAGNOSIS — M810 Age-related osteoporosis without current pathological fracture: Secondary | ICD-10-CM | POA: Diagnosis not present

## 2021-11-05 DIAGNOSIS — E781 Pure hyperglyceridemia: Secondary | ICD-10-CM | POA: Diagnosis not present

## 2021-11-05 DIAGNOSIS — F325 Major depressive disorder, single episode, in full remission: Secondary | ICD-10-CM | POA: Diagnosis not present

## 2021-11-05 DIAGNOSIS — I10 Essential (primary) hypertension: Secondary | ICD-10-CM | POA: Diagnosis not present

## 2021-11-06 ENCOUNTER — Telehealth: Payer: Self-pay | Admitting: Family Medicine

## 2021-11-06 ENCOUNTER — Ambulatory Visit: Payer: Medicare Other | Admitting: Family Medicine

## 2021-11-06 ENCOUNTER — Ambulatory Visit: Payer: Self-pay

## 2021-11-06 ENCOUNTER — Encounter: Payer: Self-pay | Admitting: Family Medicine

## 2021-11-06 VITALS — BP 108/56 | Ht 65.0 in | Wt 130.0 lb

## 2021-11-06 DIAGNOSIS — M1712 Unilateral primary osteoarthritis, left knee: Secondary | ICD-10-CM

## 2021-11-06 MED ORDER — KETOROLAC TROMETHAMINE 30 MG/ML IJ SOLN
30.0000 mg | Freq: Once | INTRAMUSCULAR | Status: AC
  Administered 2021-11-06: 30 mg via INTRA_ARTICULAR

## 2021-11-06 NOTE — Telephone Encounter (Signed)
Submitted request for zilretta left knee, today.

## 2021-11-06 NOTE — Patient Instructions (Signed)
Nice to meet you Please use ice as needed   Please send me a message in MyChart with any questions or updates.  We will call you back when the zilretta is approved.   --Dr. Raeford Razor

## 2021-11-06 NOTE — Progress Notes (Signed)
Avianna Moynahan Stratmann - 66 y.o. female MRN [id number]  Date of birth: Feb 26, 1956  SUBJECTIVE:  Including CC & ROS.  No chief complaint on file.   Fatoumata Albaugh Hudlow is a 67 y.o. female that is presenting with acute on chronic left knee pain.  The pain is more anterior in nature.  She has received steroid injection about a year ago.  No recent injury.  She has been doing rehab for her back and felt tightness in the left knee.    Review of Systems See HPI   HISTORY: Past Medical, Surgical, Social, and Family History Reviewed & Updated per EMR.   Pertinent Historical Findings include:  Past Medical History:  Diagnosis Date   Anxiety    Bulging lumbar disc 1980   Chronic pansinusitis    Colon polyp    Depression    GERD (gastroesophageal reflux disease)    Gynecological examination    sees Dr. Caren Griffins Romine    [id number](478.2)    Hypertension    Multiple sclerosis ([place]) since 1992   Dr. Arlice Colt in [facility]   Multiple thyroid nodules 1995   per pt. resolved on its own follwed by endo then released   Neurogenic bladder disorder 12/2009   w/ urge & stress inc. dx. Dr. McDiarmid   Neuropathy    Osteoarthritis    Osteoporosis    last DEXA 2018   Overactive bladder    sees Dr. Nicki Reaper McDiarmid    Pancreatitis 1977   Ulcerative proctitis ([place])    Vision abnormalities     Past Surgical History:  Procedure Laterality Date   APPENDECTOMY     breast ruptured  left   ductal cyst   CARPAL TUNNEL RELEASE Right    CHOLECYSTECTOMY     COLONOSCOPY  01/10/09   repeat in 5 yrs Dr. Wilford Corner   DILATION AND CURETTAGE OF UTERUS     LAPAROSCOPY     x 3   left knee surgery  1978   to remove bone tumor     NASAL SINUS SURGERY Bilateral 01/13/2015   Procedure: ENDOSCOPIC SINUS SURGERY;  Surgeon: Izora Gala, MD;  Location: [facility];  Service: ENT;  Laterality: Bilateral;   NASAL SINUS SURGERY  05/2017   NASAL TURBINATE REDUCTION     pubo vaginal  sling  10/2003   TONSILLECTOMY     vaginal cystocele repair     anterior   VAGINAL HYSTERECTOMY  1995   AUB & Endometriosis   widom teeth removal       PHYSICAL EXAM:  VS: BP (!) 108/56 (BP Location: Left Arm, Patient Position: Sitting)   Ht '5\' 5"'$  (1.651 m)   Wt 130 lb (59 kg)   LMP 07/06/1993   BMI 21.63 kg/m  Physical Exam Gen: NAD, alert, cooperative with exam, well-appearing MSK:  Neurovascularly intact    Limited ultrasound: Left knee:  Trace effusion in the suprapatellar pouch. Normal-appearing quadricep tendon. Large cystic appearance underneath the patellar tendon. Moderate degenerative changes in the lateral compartment.  Summary: Meniscal cyst and degenerative changes  Ultrasound and interpretation by Clearance Coots, MD  Aspiration/Injection Procedure Note Sandi Mealy Jent April 11, 1955  Procedure: Aspiration and Injection Indications: Left knee pain  Procedure Details Consent: Risks of procedure as well as the alternatives and risks of each were explained to the (patient/caregiver).  Consent for procedure obtained. Time Out: Verified patient identification, verified procedure, site/side was marked, verified correct patient position, special equipment/implants available, medications/allergies/relevent history reviewed, required  imaging and test results available.  Performed.  The area was cleaned with iodine and alcohol swabs.    The left meniscal cyst was injected using 3 cc of 1% lidocaine on a 25-gauge 1-1/2 inch needle.  An 18-gauge was used to achieve aspiration.  The syringe was switched and a mixture containing 1 cc's of 30 mg Toradol and 4 cc's of 0.25% bupivacaine was injected.  Ultrasound was used. Images were obtained in long views showing the injection.    Amount of Fluid Aspirated: minimal amount Character of Fluid: gelatinous Fluid was sent for: n/a  A sterile dressing was applied.  Patient did tolerate procedure well.     ASSESSMENT  & PLAN:   OA (osteoarthritis) of knee Acute on chronic in nature.  She has degenerative changes in the lateral compartment as well as a meniscal cyst anteriorly. -Counseled on home exercise therapy and supportive care. -Aspiration and injection. -pursue Zilretta.

## 2021-11-06 NOTE — Assessment & Plan Note (Signed)
Acute on chronic in nature.  She has degenerative changes in the lateral compartment as well as a meniscal cyst anteriorly. -Counseled on home exercise therapy and supportive care. -Aspiration and injection. -pursue Zilretta.

## 2021-11-10 ENCOUNTER — Encounter: Payer: Self-pay | Admitting: Neurology

## 2021-11-10 ENCOUNTER — Other Ambulatory Visit: Payer: Self-pay | Admitting: *Deleted

## 2021-11-10 MED ORDER — TRAMADOL HCL 50 MG PO TABS: ORAL_TABLET | ORAL | 3 refills | Status: DC

## 2021-11-10 NOTE — Addendum Note (Signed)
Addended by: Arlice Colt A on: 11/10/2021 12:59 PM   Modules accepted: Orders

## 2021-11-11 ENCOUNTER — Ambulatory Visit: Payer: Medicare Other | Admitting: Family Medicine

## 2021-11-13 ENCOUNTER — Ambulatory Visit
Admission: RE | Admit: 2021-11-13 | Discharge: 2021-11-13 | Disposition: A | Discharge status: Home / Self Care | Payer: Medicare Other | Source: Ambulatory Visit | Attending: Internal Medicine | Admitting: Internal Medicine

## 2021-11-13 ENCOUNTER — Encounter: Payer: Self-pay | Admitting: Family Medicine

## 2021-11-13 DIAGNOSIS — Z1231 Encounter for screening mammogram for malignant neoplasm of breast: Secondary | ICD-10-CM

## 2021-11-14 NOTE — Telephone Encounter (Signed)
Per flex forward portal: Approved for zilretta.  Patient responsibility for [phone] Sheri Burns) will be 20% with the remaining covered at 80% by the payer at the contracted rate. CPT code 20610 is covered at 100% by the payer at the contracted rate with no patient responsibility. Deductibles do not apply to these services. Specialist office visits if billed are covered after a $25 copay. Patient has an out of pocket maximum of $3950 and has accumulated $3144.61. If out of pocket is met, coverage goes to 100% and copays will no longer apply.

## 2021-11-16 ENCOUNTER — Other Ambulatory Visit: Payer: Self-pay | Admitting: Neurology

## 2021-11-16 NOTE — Telephone Encounter (Signed)
Pt informed of below via MyChart.

## 2021-11-18 MED ORDER — AMPHETAMINE-DEXTROAMPHETAMINE 20 MG PO TABS: 20.0000 mg | ORAL_TABLET | Freq: Two times a day (BID) | ORAL | 0 refills | Status: DC

## 2021-11-20 ENCOUNTER — Ambulatory Visit: Payer: Medicare Other | Admitting: Family Medicine

## 2021-11-20 ENCOUNTER — Ambulatory Visit: Payer: Self-pay

## 2021-11-20 VITALS — BP 136/81 | Ht 65.0 in | Wt 130.0 lb

## 2021-11-20 DIAGNOSIS — M1712 Unilateral primary osteoarthritis, left knee: Secondary | ICD-10-CM

## 2021-11-20 MED ORDER — TRIAMCINOLONE ACETONIDE 32 MG IX SRER
32.0000 mg | Freq: Once | INTRA_ARTICULAR | Status: AC
  Administered 2021-11-20: 32 mg via INTRA_ARTICULAR

## 2021-11-20 NOTE — Assessment & Plan Note (Signed)
Completed zilretta injection  - could consider PRP

## 2021-11-20 NOTE — Patient Instructions (Signed)
Good to see you Please use ice as needed   Please send me a message in MyChart with any questions or updates.  Please see me back in 4 weeks or as needed if better.   --Dr. Camy Leder  

## 2021-11-20 NOTE — Progress Notes (Signed)
Sheri Burns - 66 y.o. female MRN [id number]  Date of birth: [birth year]  SUBJECTIVE:  Including CC & ROS.  No chief complaint on file.   Sheri Burns is a 66 y.o. female that is  here for zilretta injection.    Review of Systems See HPI   HISTORY: Past Medical, Surgical, Social, and Family History Reviewed & Updated per EMR.   Pertinent Historical Findings include:  Past Medical History:  Diagnosis Date   Anxiety    Bulging lumbar disc 1980   Chronic pansinusitis    Colon polyp    Depression    GERD (gastroesophageal reflux disease)    Gynecological examination    sees Dr. Caren Griffins Romine    [id number](591.6)    Hypertension    Multiple sclerosis ([place]) since 1992   Dr. Arlice Colt in [facility]   Multiple thyroid nodules 1995   per pt. resolved on its own follwed by endo then released   Neurogenic bladder disorder 12/2009   w/ urge & stress inc. dx. Dr. McDiarmid   Neuropathy    Osteoarthritis    Osteoporosis    last DEXA 2018   Overactive bladder    sees Dr. Nicki Reaper McDiarmid    Pancreatitis 1977   Ulcerative proctitis ([place])    Vision abnormalities     Past Surgical History:  Procedure Laterality Date   APPENDECTOMY     breast ruptured  left   ductal cyst   CARPAL TUNNEL RELEASE Right    CHOLECYSTECTOMY     COLONOSCOPY  01/10/09   repeat in 5 yrs Dr. Wilford Corner   DILATION AND CURETTAGE OF UTERUS     LAPAROSCOPY     x 3   left knee surgery  1978   to remove bone tumor     NASAL SINUS SURGERY Bilateral 01/13/2015   Procedure: ENDOSCOPIC SINUS SURGERY;  Surgeon: Izora Gala, MD;  Location: [facility];  Service: ENT;  Laterality: Bilateral;   NASAL SINUS SURGERY  05/2017   NASAL TURBINATE REDUCTION     pubo vaginal sling  10/2003   TONSILLECTOMY     vaginal cystocele repair     anterior   VAGINAL HYSTERECTOMY  1995   AUB & Endometriosis   widom teeth removal       PHYSICAL EXAM:  VS: BP 136/81 (BP Location:  Left Arm, Patient Position: Sitting)   Ht '5\' 5"'$  (1.651 m)   Wt 130 lb (59 kg)   LMP 07/06/1993   BMI 21.63 kg/m  Physical Exam Gen: NAD, alert, cooperative with exam, well-appearing MSK:  Neurovascularly intact     Aspiration/Injection Procedure Note Sheri Burns 1955/09/20  Procedure: Injection Indications: left knee pain  Procedure Details Consent: Risks of procedure as well as the alternatives and risks of each were explained to the (patient/caregiver).  Consent for procedure obtained. Time Out: Verified patient identification, verified procedure, site/side was marked, verified correct patient position, special equipment/implants available, medications/allergies/relevent history reviewed, required imaging and test results available.  Performed.  The area was cleaned with iodine and alcohol swabs.    The left knee superior lateral suprapatellar pouch was injected using 3 cc of 1% lidocaine on a 25-gauge 1-1/2 inch needle.   The syringe was switched and a mixture containing 5 cc's of 32 mg Zilretta and 4 cc's of 0.25% bupivacaine was injected.  Ultrasound was used. Images were obtained in long views showing the injection.    A sterile dressing was applied.  Patient did tolerate procedure well.       ASSESSMENT & PLAN:   OA (osteoarthritis) of knee Completed zilretta injection  - could consider PRP

## 2021-12-03 ENCOUNTER — Telehealth: Payer: Self-pay

## 2021-12-03 DIAGNOSIS — R319 Hematuria, unspecified: Secondary | ICD-10-CM | POA: Diagnosis not present

## 2021-12-03 NOTE — Telephone Encounter (Signed)
Spoke with patient and advised her. ?

## 2021-12-03 NOTE — Telephone Encounter (Signed)
Patient has had a vaginal hysterectomy, so I suspect that the bleeding may be more likely coming from her urinary system.   I recommend she see her PCP tomorrow or go to the ER if it becomes prominent bleeding.   If she has a negative evaluation with her PCP, then have her see me on Monday.

## 2021-12-03 NOTE — Telephone Encounter (Signed)
Patient 66 yo with bright red spotting started this morning and seen it all day on panty liner . Noticed some "little small clots" with wiping. She questioned it the clots may have come from her urethra. She has no UTI sx but she said because of her MS the bladder does not empty completely and she has had asymptomatic UTI before. She is unsure of source of bleeding. She did not have tampons on hand.   She is very anxious about this. She called to make an appointment but Dr. Quincy Simmonds is out of office tomorrow.

## 2021-12-08 DIAGNOSIS — R310 Gross hematuria: Secondary | ICD-10-CM | POA: Diagnosis not present

## 2021-12-08 DIAGNOSIS — N319 Neuromuscular dysfunction of bladder, unspecified: Secondary | ICD-10-CM | POA: Diagnosis not present

## 2021-12-14 ENCOUNTER — Other Ambulatory Visit: Payer: Self-pay | Admitting: Neurology

## 2021-12-14 ENCOUNTER — Encounter: Payer: Self-pay | Admitting: Neurology

## 2021-12-14 DIAGNOSIS — H5213 Myopia, bilateral: Secondary | ICD-10-CM | POA: Diagnosis not present

## 2021-12-15 ENCOUNTER — Other Ambulatory Visit: Payer: Self-pay | Admitting: *Deleted

## 2021-12-15 MED ORDER — CLONAZEPAM 1 MG PO TABS: 1.0000 mg | ORAL_TABLET | Freq: Three times a day (TID) | ORAL | 0 refills | Status: DC | PRN

## 2021-12-15 MED ORDER — AMPHETAMINE-DEXTROAMPHETAMINE 20 MG PO TABS: 20.0000 mg | ORAL_TABLET | Freq: Two times a day (BID) | ORAL | 0 refills | Status: DC

## 2021-12-15 NOTE — Telephone Encounter (Signed)
Pt had appt last month and has an up coming appt next year. Pt has checked out in the registry.

## 2021-12-16 ENCOUNTER — Encounter: Payer: Self-pay | Admitting: Neurology

## 2021-12-23 DIAGNOSIS — I700 Atherosclerosis of aorta: Secondary | ICD-10-CM | POA: Diagnosis not present

## 2021-12-23 DIAGNOSIS — Z981 Arthrodesis status: Secondary | ICD-10-CM | POA: Diagnosis not present

## 2021-12-23 DIAGNOSIS — R310 Gross hematuria: Secondary | ICD-10-CM | POA: Diagnosis not present

## 2021-12-23 DIAGNOSIS — K769 Liver disease, unspecified: Secondary | ICD-10-CM | POA: Diagnosis not present

## 2021-12-29 DIAGNOSIS — N3946 Mixed incontinence: Secondary | ICD-10-CM | POA: Diagnosis not present

## 2021-12-29 DIAGNOSIS — R310 Gross hematuria: Secondary | ICD-10-CM | POA: Diagnosis not present

## 2021-12-30 ENCOUNTER — Other Ambulatory Visit: Payer: Self-pay | Admitting: Urology

## 2021-12-31 ENCOUNTER — Encounter: Payer: Self-pay | Admitting: Neurology

## 2021-12-31 ENCOUNTER — Other Ambulatory Visit: Payer: Self-pay | Admitting: Neurology

## 2021-12-31 NOTE — Telephone Encounter (Signed)
Per drug registry, last refilled 12/16/21 #[id number]. Too soon to send refill in

## 2022-01-01 NOTE — Progress Notes (Signed)
Second request for orders: left voicemail for [employer]

## 2022-01-03 NOTE — Patient Instructions (Addendum)
SURGICAL WAITING ROOM VISITATION Patients having surgery or a procedure may have no more than 2 support people in the waiting area - these visitors may rotate in the visitor waiting room.   Children under the age of 55 must have an adult with them who is not the patient. If the patient needs to stay at the hospital during part of their recovery, the visitor guidelines for inpatient rooms apply.  PRE-OP VISITATION  Pre-op nurse will coordinate an appropriate time for 1 support person to accompany the patient in pre-op.  This support person may not rotate.  This visitor will be contacted when the time is appropriate for the visitor to come back in the pre-op area.  Please refer to the [facility] website for the visitor guidelines for Inpatients (after your surgery is over and you are in a regular room).  You are not required to quarantine at this time prior to your surgery. However, you must do this: Hand Hygiene often Do NOT share personal items Notify your provider if you are in close contact with someone who has COVID or you develop fever 100.4 or greater, new onset of sneezing, cough, sore throat, shortness of breath or body aches.  If you test positive for Covid or have been in contact with anyone that has tested positive in the last 10 days please notify you surgeon.    Your procedure is scheduled on:  Wednesday  January 06, 2022  Report to [facility] Main Entrance: [address] entrance where the [employer] is available.   Report to admitting at: 1:45 PM  +++++Call this number if you have any questions or problems the morning of surgery [phone]  Do not eat or drink anything after Midnight the night prior to your surgery/procedure.  FOLLOW BOWEL PREP AND ANY ADDITIONAL PRE OP INSTRUCTIONS YOU RECEIVED FROM YOUR SURGEON'S OFFICE!!!   Oral Hygiene is also important to reduce your risk of infection.        Remember - BRUSH YOUR TEETH THE MORNING OF SURGERY WITH YOUR  REGULAR TOOTHPASTE  Take ONLY these medicines the morning of surgery with A SIP OF WATER: Clonazepam, Gabapentin, Duloxetine (Cymbalta), Oxybutynin (Ditropan) and you may use your Flonase nasal spray if needed                    You may not have any metal on your body including hair pins, jewelry, and body piercing  Do not wear make-up, lotions, powders, perfumes  or deodorant  Do not wear nail polish including gel and S&S, artificial / acrylic nails, or any other type of covering on natural nails including finger and toenails. If you have artificial nails, gel coating, etc., that needs to be removed by a nail salon, Please have this removed prior to surgery. Not doing so may mean that your surgery could be cancelled or delayed if the Surgeon or anesthesia staff feels like they are unable to monitor you safely.   Do not shave 48 hours prior to surgery to avoid nicks in your skin which may contribute to postoperative infections.    Contacts, Hearing Aids, dentures or bridgework may not be worn into surgery.   Patients discharged on the day of surgery will not be allowed to drive home.  Someone NEEDS to stay with you for the first 24 hours after anesthesia.  Do not bring your home medications to the hospital. The Pharmacy will dispense medications listed on your medication list to you during your  admission in the Hospital.  Special Instructions: Bring a copy of your healthcare power of attorney and living will documents the day of surgery, if you wish to have them scanned into your [facility] Records- EPIC  Please read over the following fact sheets you were given: IF YOU HAVE QUESTIONS ABOUT YOUR PRE-OP INSTRUCTIONS, PLEASE CALL [phone]  ([place])   [facility] - Preparing for Surgery Before surgery, you can play an important role.  Because skin is not sterile, your skin needs to be as free of germs as possible.  You can reduce the number of germs on your skin by washing with CHG  (chlorahexidine gluconate) soap before surgery.  CHG is an antiseptic cleaner which kills germs and bonds with the skin to continue killing germs even after washing. Please DO NOT use if you have an allergy to CHG or antibacterial soaps.  If your skin becomes reddened/irritated stop using the CHG and inform your nurse when you arrive at Short Stay. Do not shave (including legs and underarms) for at least 48 hours prior to the first CHG shower.  You may shave your face/neck.  Please follow these instructions carefully:  1.  Shower with CHG Soap the night before surgery and the  morning of surgery.  2.  If you choose to wash your hair, wash your hair first as usual with your normal  shampoo.  3.  After you shampoo, rinse your hair and body thoroughly to remove the shampoo.                             4.  Use CHG as you would any other liquid soap.  You can apply chg directly to the skin and wash.  Gently with a scrungie or clean washcloth.  5.  Apply the CHG Soap to your body ONLY FROM THE NECK DOWN.   Do not use on face/ open                           Wound or open sores. Avoid contact with eyes, ears mouth and genitals (private parts).                       Wash face,  Genitals (private parts) with your normal soap.             6.  Wash thoroughly, paying special attention to the area where your  surgery  will be performed.  7.  Thoroughly rinse your body with warm water from the neck down.  8.  DO NOT shower/wash with your normal soap after using and rinsing off the CHG Soap.            9.  Pat yourself dry with a clean towel.            10.  Wear clean pajamas.            11.  Place clean sheets on your bed the night of your first shower and do not  sleep with pets.  ON THE DAY OF SURGERY : Do not apply any lotions/deodorants the morning of surgery.  Please wear clean clothes to the hospital/surgery center.    FAILURE TO FOLLOW THESE INSTRUCTIONS MAY RESULT IN THE CANCELLATION OF YOUR  SURGERY  PATIENT SIGNATURE_________________________________  NURSE SIGNATURE__________________________________  ________________________________________________________________________

## 2022-01-03 NOTE — Progress Notes (Signed)
COVID Vaccine received:  '[]'$  No '[x]'$  Yes Date of any COVID positive Test in last 90 days:  PCP - Rochele Raring, MD Cardiologist -  Neurology- Arlice Colt, MD  Chest x-ray -  EKG -  do at PST Stress Test -  ECHO -  Cardiac Cath -   PCR screen: '[]'$  Ordered & Completed                      '[]'$   Not Ordered but Needs PROFEND                      '[x]'$   N/A for this surgery  Surgery Plan:  '[x]'$  Ambulatory  '[]'$  Outpatient  '[]'$  Admit Anesthesia:    '[x]'$  General  '[]'$  Spinal  '[]'$   Choice  Bowel Prep - '[x]'$  No  '[]'$   Yes _____________  Pacemaker/ICD device     '[x]'$  N/A Spinal Cord Stimulator:'[x]'$  No '[]'$  Yes      (Remind patient to bring remote DOS) Other Implants:   History of Sleep Apnea? '[]'$  No '[]'$  Yes   Sleep Study Date:   CPAP used?- '[]'$  No '[]'$  Yes  (Instruct to bring their mask & Tubing)  Does the patient monitor blood sugar? '[]'$  No '[]'$  Yes  '[x]'$  N/A Does patient have a [employer] or Dexacom? '[]'$  No '[]'$  Yes   Fasting Blood Sugar Ranges-  Checks Blood Sugar _____ times a day  Blood Thinner Instructions:  none Aspirin Instructions: Last Dose:  ERAS Protocol Ordered: '[x]'$  No  '[]'$  Yes Patient is NPO  Comments:Allergic: IVP dye and Gadolinium contrast- Hives for both  Activity level: Patient can / can not climb a flight of stairs without difficulty;  '[]'$  No CP  '[]'$  No SOB,  but would have ______   Patient can / can not perform ADLs without assistance.   Anesthesia review: Multiple Sclerosis- ataxic gait, HTN, hx T7-S1 fusion (Dr. Maudie Mercury in [place])  Patient denies shortness of breath, fever, cough and chest pain at PAT appointment.  Patient verbalized understanding and agreement to the Pre-Surgical Instructions that were given to them at this PAT appointment. Patient was also educated of the need to review these PAT instructions again prior to his/her surgery.I reviewed the appropriate phone numbers to call if they have any and questions or concerns.

## 2022-01-05 ENCOUNTER — Encounter (HOSPITAL_COMMUNITY)
Admission: RE | Admit: 2022-01-05 | Discharge: 2022-01-05 | Disposition: A | Discharge status: Home / Self Care | Payer: Medicare Other | Source: Ambulatory Visit | Attending: Urology | Admitting: Urology

## 2022-01-05 ENCOUNTER — Other Ambulatory Visit: Payer: Self-pay

## 2022-01-05 ENCOUNTER — Encounter (HOSPITAL_COMMUNITY): Payer: Self-pay

## 2022-01-05 VITALS — BP 130/76 | HR 84 | Temp 98.5°F | Resp 16 | Ht 65.0 in | Wt 133.0 lb

## 2022-01-05 DIAGNOSIS — Z01818 Encounter for other preprocedural examination: Secondary | ICD-10-CM | POA: Diagnosis not present

## 2022-01-05 DIAGNOSIS — I10 Essential (primary) hypertension: Secondary | ICD-10-CM | POA: Diagnosis not present

## 2022-01-05 HISTORY — DX: Myoneural disorder, unspecified: G70.9

## 2022-01-05 HISTORY — DX: Pneumonia, unspecified organism: J18.9

## 2022-01-05 LAB — BASIC METABOLIC PANEL
Anion gap: 7 (ref 5–15)
BUN: 15 mg/dL (ref 8–23)
CO2: 28 mmol/L (ref 22–32)
Calcium: 9.7 mg/dL (ref 8.9–10.3)
Chloride: 104 mmol/L (ref 98–111)
Creatinine, Ser: 1.03 mg/dL — ABNORMAL HIGH (ref 0.44–1.00)
GFR, Estimated: 60 mL/min — ABNORMAL LOW (ref >60)
Glucose, Bld: 90 mg/dL (ref 70–99)
Potassium: 3.8 mmol/L (ref 3.5–5.1)
Sodium: 139 mmol/L (ref 135–145)

## 2022-01-05 LAB — CBC
HCT: 35.2 % — ABNORMAL LOW (ref 36.0–46.0)
Hemoglobin: 10.9 g/dL — ABNORMAL LOW (ref 12.0–15.0)
MCH: 30.4 pg (ref 26.0–34.0)
MCHC: 31 g/dL (ref 30.0–36.0)
MCV: 98.1 fL (ref 80.0–100.0)
Platelets: 345 10*3/uL (ref 150–400)
RBC: 3.59 MIL/uL — ABNORMAL LOW (ref 3.87–5.11)
RDW: 12.7 % (ref 11.5–15.5)
WBC: 9.4 10*3/uL (ref 4.0–10.5)
nRBC: 0 % (ref 0.0–0.2)

## 2022-01-06 ENCOUNTER — Encounter (HOSPITAL_COMMUNITY)
Admission: RE | Disposition: A | Discharge status: Home / Self Care | Payer: Self-pay | Source: Home / Self Care | Attending: Urology

## 2022-01-06 ENCOUNTER — Encounter (HOSPITAL_COMMUNITY): Payer: Self-pay | Admitting: Urology

## 2022-01-06 ENCOUNTER — Ambulatory Visit (HOSPITAL_COMMUNITY)
Admission: RE | Admit: 2022-01-06 | Discharge: 2022-01-06 | Disposition: A | Discharge status: Home / Self Care | Payer: Medicare Other | Attending: Urology | Admitting: Urology

## 2022-01-06 ENCOUNTER — Ambulatory Visit (HOSPITAL_BASED_OUTPATIENT_CLINIC_OR_DEPARTMENT_OTHER): Payer: Medicare Other | Admitting: Certified Registered"

## 2022-01-06 ENCOUNTER — Ambulatory Visit (HOSPITAL_COMMUNITY): Payer: Medicare Other | Admitting: Physician Assistant

## 2022-01-06 ENCOUNTER — Other Ambulatory Visit: Payer: Self-pay

## 2022-01-06 DIAGNOSIS — N368 Other specified disorders of urethra: Secondary | ICD-10-CM | POA: Diagnosis not present

## 2022-01-06 DIAGNOSIS — N3281 Overactive bladder: Secondary | ICD-10-CM | POA: Insufficient documentation

## 2022-01-06 DIAGNOSIS — K219 Gastro-esophageal reflux disease without esophagitis: Secondary | ICD-10-CM | POA: Diagnosis not present

## 2022-01-06 DIAGNOSIS — F418 Other specified anxiety disorders: Secondary | ICD-10-CM | POA: Diagnosis not present

## 2022-01-06 DIAGNOSIS — N3941 Urge incontinence: Secondary | ICD-10-CM | POA: Diagnosis not present

## 2022-01-06 DIAGNOSIS — Z8711 Personal history of peptic ulcer disease: Secondary | ICD-10-CM | POA: Diagnosis not present

## 2022-01-06 DIAGNOSIS — I10 Essential (primary) hypertension: Secondary | ICD-10-CM

## 2022-01-06 DIAGNOSIS — D304 Benign neoplasm of urethra: Secondary | ICD-10-CM | POA: Diagnosis not present

## 2022-01-06 DIAGNOSIS — G35 Multiple sclerosis: Secondary | ICD-10-CM | POA: Diagnosis not present

## 2022-01-06 DIAGNOSIS — N362 Urethral caruncle: Secondary | ICD-10-CM | POA: Insufficient documentation

## 2022-01-06 DIAGNOSIS — N342 Other urethritis: Secondary | ICD-10-CM | POA: Diagnosis not present

## 2022-01-06 DIAGNOSIS — Z87891 Personal history of nicotine dependence: Secondary | ICD-10-CM | POA: Diagnosis not present

## 2022-01-06 DIAGNOSIS — D649 Anemia, unspecified: Secondary | ICD-10-CM | POA: Diagnosis not present

## 2022-01-06 DIAGNOSIS — R319 Hematuria, unspecified: Secondary | ICD-10-CM | POA: Diagnosis not present

## 2022-01-06 DIAGNOSIS — M1990 Unspecified osteoarthritis, unspecified site: Secondary | ICD-10-CM | POA: Insufficient documentation

## 2022-01-06 HISTORY — PX: CYSTOSCOPY WITH BIOPSY: SHX5122

## 2022-01-06 SURGERY — CYSTOSCOPY, WITH BIOPSY
Anesthesia: General

## 2022-01-06 MED ORDER — CHLORHEXIDINE GLUCONATE 0.12 % MT SOLN
15.0000 mL | Freq: Once | OROMUCOSAL | Status: AC
  Administered 2022-01-06: 15 mL via OROMUCOSAL

## 2022-01-06 MED ORDER — FENTANYL CITRATE (PF) 100 MCG/2ML IJ SOLN
INTRAMUSCULAR | Status: DC | PRN
  Administered 2022-01-06 (×2): 50 ug via INTRAVENOUS

## 2022-01-06 MED ORDER — DEXAMETHASONE SODIUM PHOSPHATE 10 MG/ML IJ SOLN
INTRAMUSCULAR | Status: AC
  Filled 2022-01-06: qty 1

## 2022-01-06 MED ORDER — ONDANSETRON HCL 4 MG/2ML IJ SOLN
INTRAMUSCULAR | Status: AC
  Filled 2022-01-06: qty 2

## 2022-01-06 MED ORDER — PROPOFOL 10 MG/ML IV BOLUS
INTRAVENOUS | Status: AC
  Filled 2022-01-06: qty 20

## 2022-01-06 MED ORDER — LIDOCAINE 2% (20 MG/ML) 5 ML SYRINGE
INTRAMUSCULAR | Status: DC | PRN
  Administered 2022-01-06: 60 mg via INTRAVENOUS

## 2022-01-06 MED ORDER — DEXAMETHASONE SODIUM PHOSPHATE 10 MG/ML IJ SOLN
INTRAMUSCULAR | Status: DC | PRN
  Administered 2022-01-06: 8 mg via INTRAVENOUS

## 2022-01-06 MED ORDER — PROPOFOL 10 MG/ML IV BOLUS
INTRAVENOUS | Status: DC | PRN
  Administered 2022-01-06: 140 mg via INTRAVENOUS

## 2022-01-06 MED ORDER — OXYCODONE-ACETAMINOPHEN 5-325 MG PO TABS: 1.0000 | ORAL_TABLET | Freq: Four times a day (QID) | ORAL | 0 refills | Status: DC | PRN

## 2022-01-06 MED ORDER — FENTANYL CITRATE PF 50 MCG/ML IJ SOSY: 25.0000 ug | PREFILLED_SYRINGE | INTRAMUSCULAR | Status: DC | PRN

## 2022-01-06 MED ORDER — OXYCODONE HCL 5 MG/5ML PO SOLN: 5.0000 mg | Freq: Once | ORAL | Status: DC | PRN

## 2022-01-06 MED ORDER — LIDOCAINE HCL (PF) 2 % IJ SOLN
INTRAMUSCULAR | Status: AC
  Filled 2022-01-06: qty 5

## 2022-01-06 MED ORDER — LACTATED RINGERS IV SOLN: INTRAVENOUS | Status: DC

## 2022-01-06 MED ORDER — ONDANSETRON HCL 4 MG/2ML IJ SOLN
INTRAMUSCULAR | Status: DC | PRN
  Administered 2022-01-06: 4 mg via INTRAVENOUS

## 2022-01-06 MED ORDER — ONDANSETRON HCL 4 MG/2ML IJ SOLN: 4.0000 mg | Freq: Once | INTRAMUSCULAR | Status: DC | PRN

## 2022-01-06 MED ORDER — MIDAZOLAM HCL 5 MG/5ML IJ SOLN
INTRAMUSCULAR | Status: DC | PRN
  Administered 2022-01-06: 2 mg via INTRAVENOUS

## 2022-01-06 MED ORDER — STERILE WATER FOR IRRIGATION IR SOLN
Status: DC | PRN
  Administered 2022-01-06: 3000 mL

## 2022-01-06 MED ORDER — ARTIFICIAL TEARS OPHTHALMIC OINT
TOPICAL_OINTMENT | OPHTHALMIC | Status: AC
  Filled 2022-01-06: qty 3.5

## 2022-01-06 MED ORDER — FENTANYL CITRATE (PF) 100 MCG/2ML IJ SOLN
INTRAMUSCULAR | Status: AC
  Filled 2022-01-06: qty 2

## 2022-01-06 MED ORDER — ORAL CARE MOUTH RINSE: 15.0000 mL | Freq: Once | OROMUCOSAL | Status: AC

## 2022-01-06 MED ORDER — OXYCODONE HCL 5 MG PO TABS: 5.0000 mg | ORAL_TABLET | Freq: Once | ORAL | Status: DC | PRN

## 2022-01-06 MED ORDER — PHENYLEPHRINE 80 MCG/ML (10ML) SYRINGE FOR IV PUSH (FOR BLOOD PRESSURE SUPPORT)
PREFILLED_SYRINGE | INTRAVENOUS | Status: DC | PRN
  Administered 2022-01-06: 80 ug via INTRAVENOUS

## 2022-01-06 MED ORDER — GENTAMICIN SULFATE 40 MG/ML IJ SOLN
5.0000 mg/kg | INTRAVENOUS | Status: AC
  Administered 2022-01-06: 300 mg via INTRAVENOUS
  Filled 2022-01-06: qty 7.5

## 2022-01-06 MED ORDER — MIDAZOLAM HCL 2 MG/2ML IJ SOLN
INTRAMUSCULAR | Status: AC
  Filled 2022-01-06: qty 2

## 2022-01-06 SURGICAL SUPPLY — 16 items
BAG URINE DRAIN 2000ML AR STRL (UROLOGICAL SUPPLIES) IMPLANT
BAG URO CATCHER STRL LF (MISCELLANEOUS) ×1 IMPLANT
DRAPE FOOT SWITCH (DRAPES) ×1 IMPLANT
ELECT REM PT RETURN 15FT ADLT (MISCELLANEOUS) ×1 IMPLANT
EVACUATOR MICROVAS BLADDER (UROLOGICAL SUPPLIES) IMPLANT
GLOVE SURG LX STRL 7.5 STRW (GLOVE) ×1 IMPLANT
GOWN SRG XL LVL 4 BRTHBL STRL (GOWNS) ×1 IMPLANT
GOWN STRL NON-REIN XL LVL4 (GOWNS) ×2
KIT TURNOVER KIT A (KITS) IMPLANT
LOOP CUT BIPOLAR 24F LRG (ELECTROSURGICAL) IMPLANT
MANIFOLD NEPTUNE II (INSTRUMENTS) ×1 IMPLANT
PACK CYSTO (CUSTOM PROCEDURE TRAY) ×1 IMPLANT
SYR TOOMEY IRRIG 70ML (MISCELLANEOUS)
SYRINGE TOOMEY IRRIG 70ML (MISCELLANEOUS) IMPLANT
TUBING CONNECTING 10 (TUBING) ×1 IMPLANT
TUBING UROLOGY SET (TUBING) ×1 IMPLANT

## 2022-01-06 NOTE — H&P (Signed)
Sheri Burns is an 66 y.o. female.    Chief Complaint: Pre-Op Bladder/Urethral Biopsy  HPI:   1- Urge Incontinence - on oxybutynin ER per Dr. Matilde Sprang and meeting goals   2 - Hematuria, Rule Out Urethral Neoplasm - new gross heamtuira and some spotting per urethra 2023. CT Urogram unremarkable for upper tract etioloty. Cysto by Dr. Matilde Sprang with papillary appearing distal urethral lesion (cysto image reviewed). No long term smoking.   PMH sig for MS (minimal deficits, follws Dr. Felecia Shelling), open chole, open appy, benign hyt, long segment T-S spinal fusion (severe kyphosis and disc disease, no deficits).Her PCP is Jola Baptist MD.   Today " Sheri Burns " is seen to proceed with cysto, and bladder/urethral biopsy to rule out urethral neoplasm. No interval fevers. Cr 1,    Past Medical History:  Diagnosis Date   Anxiety    Bulging lumbar disc 1980   Chronic pansinusitis    Colon polyp    Depression    GERD (gastroesophageal reflux disease)    Gynecological examination    sees Dr. Caren Griffins Romine    TIRWERXV(400.8)    Hypertension    Multiple sclerosis [facility]) since 1992   Dr. Arlice Colt in [facility]   Multiple thyroid nodules 1995   per pt. resolved on its own follwed by endo then released   Neurogenic bladder disorder 12/2009   w/ [facility] dx. Dr. McDiarmid   Neuromuscular disorder [facility])    Multiple sclerosis   Neuropathy    Osteoarthritis    Osteoporosis    last DEXA 2018   Overactive bladder    sees Dr. Nicki Reaper McDiarmid    Pancreatitis 1977   Pneumonia    Ulcerative proctitis ([place])    Vision abnormalities     Past Surgical History:  Procedure Laterality Date   APPENDECTOMY     breast ruptured  left   ductal cyst   CARPAL TUNNEL RELEASE Right    CHOLECYSTECTOMY     COLONOSCOPY  01/10/09   repeat in 5 yrs Dr. Wilford Corner   DILATION AND CURETTAGE OF UTERUS     LAPAROSCOPY     x 3   left knee surgery  1978   to remove bone tumor     NASAL  SINUS SURGERY Bilateral 01/13/2015   Procedure: ENDOSCOPIC SINUS SURGERY;  Surgeon: Izora Gala, MD;  Location: [place];  Service: ENT;  Laterality: Bilateral;   NASAL SINUS SURGERY  05/2017   NASAL TURBINATE REDUCTION     pubo vaginal sling  10/2003   TONSILLECTOMY     vaginal cystocele repair     anterior   VAGINAL HYSTERECTOMY  1995   AUB & Endometriosis   widom teeth removal      Family History  Problem Relation Age of Onset   Osteoporosis Mother        severe   Cancer Mother 51       colon /Breast also m. niece at 56's   Breast cancer Mother    Osteopenia Sister        X 2   Breast cancer Other    Social History:  reports that she quit smoking about 42 years ago. Her smoking use included cigarettes. She has a 5.00 pack-year smoking history. She has never used smokeless tobacco. She reports that she does not drink alcohol and does not use drugs.  Allergies:  Allergies  Allergen Reactions   Amoxicillin Hives and Itching   Codeine Nausea  And Vomiting    vomiting   Demerol [Meperidine] Nausea And Vomiting   Ivp Dye [Iodinated Contrast Media] Hives   Penicillins Hives   Stadol [Butorphanol] Other (See Comments)    Per patient psychotic event   Sulfonamide Derivatives Hives   Clindamycin/Lincomycin Rash   Dog Epithelium Allergy Skin Test Other (See Comments)    Sneezing, cough Cat/ sinus infection and congestion   Gadolinium Derivatives Hives    No medications prior to admission.    Results for orders placed or performed during the hospital encounter of 01/05/22 (from the past 48 hour(s))  Basic metabolic panel per protocol     Status: Abnormal   Collection Time: 01/05/22  8:29 AM  Result Value Ref Range   Sodium 139 135 - 145 mmol/L   Potassium 3.8 3.5 - 5.1 mmol/L   Chloride 104 98 - 111 mmol/L   CO2 28 22 - 32 mmol/L   Glucose, Bld 90 70 - 99 mg/dL    Comment: Glucose reference range applies only to samples taken after fasting for at least 8  hours.   BUN 15 8 - 23 mg/dL   Creatinine, Ser 1.03 (H) 0.44 - 1.00 mg/dL   Calcium 9.7 8.9 - 10.3 mg/dL   GFR, Estimated 60 (L) >60 mL/min    Comment: (NOTE) Calculated using the CKD-EPI Creatinine Equation (2021)    Anion gap 7 5 - 15    Comment: Performed at [facility], [place] [address], [place], [place] [address]  CBC per protocol     Status: Abnormal   Collection Time: 01/05/22  8:29 AM  Result Value Ref Range   WBC 9.4 4.0 - 10.5 K/uL   RBC 3.59 (L) 3.87 - 5.11 MIL/uL   Hemoglobin 10.9 (L) 12.0 - 15.0 g/dL   HCT 35.2 (L) 36.0 - 46.0 %   MCV 98.1 80.0 - 100.0 fL   MCH 30.4 26.0 - 34.0 pg   MCHC 31.0 30.0 - 36.0 g/dL   RDW 12.7 11.5 - 15.5 %   Platelets 345 150 - 400 K/uL   nRBC 0.0 0.0 - 0.2 %    Comment: Performed at [facility], [place] [address], [place], [place] [address]   No results found.  Review of Systems  Constitutional:  Negative for chills and fever.  All other systems reviewed and are negative.   Last menstrual period 07/06/1993. Physical Exam Vitals reviewed.  HENT:     Head: Normocephalic.     Nose: Nose normal.  Eyes:     Pupils: Pupils are equal, round, and reactive to light.  Cardiovascular:     Rate and Rhythm: Normal rate.  Pulmonary:     Effort: Pulmonary effort is normal.  Abdominal:     General: Abdomen is flat.  Genitourinary:    Comments: No CVAT Musculoskeletal:        General: Normal range of motion.     Cervical back: Normal range of motion.  Neurological:     General: No focal deficit present.     Mental Status: She is alert.  Psychiatric:        Mood and Affect: Mood normal.      Assessment/Plan  Proceed as planned with cysto, and bladder/urethral biopsy to rule out neoplasm. RIsks, benefits, alternatives, expected peri-op course discussed previously and reiterated today.   Alexis Frock, MD 01/06/2022, 6:45 AM

## 2022-01-06 NOTE — Discharge Instructions (Signed)
1 - You may have urinary urgency (bladder spasms) and bloody urine on / off for up to 2 weeks.  This is normal.  2 - Call MD or go to ER for fever >102, severe pain / nausea / vomiting not relieved by medications, or acute change in medical status  

## 2022-01-06 NOTE — Brief Op Note (Signed)
01/06/2022  5:16 PM  PATIENT:  Sheri Burns  66 y.o. female  PRE-OPERATIVE DIAGNOSIS:  URETHRAL MASS  POST-OPERATIVE DIAGNOSIS:  urethral mass  PROCEDURE:  Procedure(s) with comments: CYSTOSCOPY WITH BLADDER/URETHRAL BIOPSY (N/A) - 45 MINS  SURGEON:  Surgeon(s) and Role:    * Alexis Frock, MD - Primary  PHYSICIAN ASSISTANT:   ASSISTANTS: none   ANESTHESIA:   general  EBL:  minimal   BLOOD ADMINISTERED:none  DRAINS: none   LOCAL MEDICATIONS USED:  NONE  SPECIMEN:  Source of Specimen:  urethral biopsy  DISPOSITION OF SPECIMEN:  PATHOLOGY  COUNTS:  YES  TOURNIQUET:  * No tourniquets in log *  DICTATION: .Other Dictation: Dictation Number [id number]  PLAN OF CARE: Discharge to home after PACU  PATIENT DISPOSITION:  PACU - hemodynamically stable.   Delay start of Pharmacological VTE agent (>24hrs) due to surgical blood loss or risk of bleeding: yes

## 2022-01-06 NOTE — Anesthesia Preprocedure Evaluation (Addendum)
Anesthesia Evaluation  Patient identified by MRN, date of birth, ID band Patient awake    Reviewed: Allergy & Precautions, NPO status , Patient's Chart, lab work & pertinent test results  History of Anesthesia Complications Negative for: history of anesthetic complications  Airway Mallampati: III  TM Distance: >3 FB Neck ROM: Full    Dental  (+) Dental Advisory Given, Teeth Intact   Pulmonary former smoker,    Pulmonary exam normal        Cardiovascular hypertension, Pt. on medications Normal cardiovascular exam     Neuro/Psych  Headaches, PSYCHIATRIC DISORDERS Anxiety Depression  Neuromuscular disease (MS)    GI/Hepatic Neg liver ROS, PUD, GERD  Controlled,  Endo/Other  negative endocrine ROS  Renal/GU negative Renal ROS Bladder dysfunction Female GU complaint     Musculoskeletal  (+) Arthritis ,   Abdominal   Peds  Hematology  (+) Blood dyscrasia, anemia ,   Anesthesia Other Findings   Reproductive/Obstetrics                            Anesthesia Physical Anesthesia Plan  ASA: 3  Anesthesia Plan: General   Post-op Pain Management: Minimal or no pain anticipated   Induction: Intravenous  PONV Risk Score and Plan: 3 and Treatment may vary due to age or medical condition, Ondansetron and Dexamethasone  Airway Management Planned: LMA  Additional Equipment: None  Intra-op Plan:   Post-operative Plan: Extubation in OR  Informed Consent:   Plan Discussed with: CRNA and Anesthesiologist  Anesthesia Plan Comments:         Anesthesia Quick Evaluation

## 2022-01-06 NOTE — Anesthesia Postprocedure Evaluation (Signed)
Anesthesia Post Note  Patient: Sheri Burns  Procedure(s) Performed: CYSTOSCOPY WITH BLADDER/URETHRAL BIOPSY     Patient location during evaluation: PACU Anesthesia Type: General Level of consciousness: awake and alert Pain management: pain level controlled Vital Signs Assessment: post-procedure vital signs reviewed and stable Respiratory status: spontaneous breathing, nonlabored ventilation and respiratory function stable Cardiovascular status: stable and blood pressure returned to baseline Anesthetic complications: no   No notable events documented.  Last Vitals:  Vitals:   01/06/22 1730 01/06/22 1745  BP: (!) 146/80 (!) 140/84  Pulse: 84 84  Resp: (!) 8 17  Temp: 36.7 C   SpO2: 97% 100%    Last Pain:  Vitals:   01/06/22 1745  TempSrc:   PainSc: 0-No pain                 Audry Pili

## 2022-01-06 NOTE — Transfer of Care (Signed)
Immediate Anesthesia Transfer of Care Note  Patient: Sheri Burns  Procedure(s) Performed: CYSTOSCOPY WITH BLADDER/URETHRAL BIOPSY  Patient Location: PACU  Anesthesia Type:General  Level of Consciousness: drowsy and patient cooperative  Airway & Oxygen Therapy: Patient Spontanous Breathing and Patient connected to face mask oxygen  Post-op Assessment: Report given to RN and Post -op Vital signs reviewed and stable  Post vital signs: Reviewed and stable  Last Vitals:  Vitals Value Taken Time  BP 146/80 01/06/22 1730  Temp    Pulse 80 01/06/22 1731  Resp 7 01/06/22 1731  SpO2 99 % 01/06/22 1731  Vitals shown include unvalidated device data.  Last Pain:  Vitals:   01/06/22 1420  TempSrc: Oral  PainSc:          Complications: No notable events documented.

## 2022-01-06 NOTE — Anesthesia Procedure Notes (Signed)
Procedure Name: LMA Insertion Date/Time: 01/06/2022 4:48 PM  Performed by: Montel Clock, CRNAPre-anesthesia Checklist: Patient identified, Emergency Drugs available, Suction available, Patient being monitored and Timeout performed Patient Re-evaluated:Patient Re-evaluated prior to induction Oxygen Delivery Method: Circle system utilized Preoxygenation: Pre-oxygenation with 100% oxygen Induction Type: IV induction Ventilation: Mask ventilation without difficulty LMA: LMA with gastric port inserted LMA Size: 4.0 Number of attempts: 1 Dental Injury: Teeth and Oropharynx as per pre-operative assessment

## 2022-01-07 ENCOUNTER — Encounter (HOSPITAL_COMMUNITY): Payer: Self-pay | Admitting: Urology

## 2022-01-07 NOTE — Op Note (Signed)
NAME: Sheri Burns, LUTE MEDICAL RECORD NO: [id number] ACCOUNT NO: [id number] DATE OF BIRTH: Mar 25, 1955 FACILITY: [facility] LOCATION: [facility] PHYSICIAN: Alexis Frock, MD  Operative Report   PREOPERATIVE DIAGNOSIS:  Small urethral mass.  POSTOPERATIVE DIAGNOSIS:  Small urethral mass.  PROCEDURE PERFORMED:  Urethral/bladder biopsy with cystoscopy.  ESTIMATED BLOOD LOSS:  Nil.  COMPLICATIONS:  None.  SPECIMEN:  Urethral biopsy for permanent pathology.  FINDINGS: 1.  Unremarkable bladder. 2.  Small polypoid urethral mass at 12 o'clock position of the urethra, size 5 mm, not high-grade appearing.  INDICATIONS:  The patient is a pleasant 66 year old lady with history of irritative voiding, managed well with medical therapy.  She was found on evaluation of some urethral bloody spotting.  She had a questionable very small neoplasm in the distal half  of her urethra, but not overtly protruding the meatus.  She was referred for further management.  Axial imaging was unremarkable.  Given the appearance of the lesion on office cystoscopy performed by one of my partners with excellent photodocumentation,  I counseled the patient towards operative biopsy as most definitive goal of ruling out significant neoplasm.  Informed consent was obtained and placed in medical record.  PROCEDURE IN DETAIL:  The patient Sheri Burns being verified and procedure being cystoscopy with bladder/urethral biopsy and fulguration confirmed.  Procedure timeout was performed.  Intravenous antibiotics were administered.  General anesthesia was  induced.  The patient was placed into a low lithotomy position.  Sterile field was created, prepped and draped the patient's vagina, introitus, and proximal thighs using iodine.  Cystourethroscopy was performed using 21-French rigid cystoscope with  offset lens.  Inspection of the urinary bladder revealed no diverticula, calcifications, papillary lesions, and revealed mild  trabeculation.  Ureteral orifices appeared single.  With very careful inspection of the entire urethra, there was again a small  polypoid lesion noted at the 12 o'clock position ____ very small stalk.  This was not high-grade appearing.  It did appear amenable to simple biopsy with cold cup biopsy forceps and as such cold cup biopsy forceps were used to biopsy the lesion.  This  inherently removed all the gross visible aspect of it completely as it appeared to be attached by a very small stalk.  This polypoid tissue was set aside for permanent pathology and labeled as such. Urethra once again inspected and there was no  additional obvious concerning tissue.  Hemostasis was quite excellent.  Given this quite sensitive area with high propensity for stricturing, I elected not to perform any fulguration or cautery.  Hemostasis was already quite good.  Bladder was emptied  per cystoscope.  Procedure was then terminated.  The patient tolerated the procedure well, no immediate perioperative complications.  The patient taken to postanesthesia care unit in stable condition.  Plan for discharge home.   NIK D: 01/06/2022 5:21:18 pm T: 01/07/2022 12:30:00 am  JOB: [id number]/ [id number]

## 2022-01-08 LAB — SURGICAL PATHOLOGY

## 2022-01-13 DIAGNOSIS — G35 Multiple sclerosis: Secondary | ICD-10-CM | POA: Diagnosis not present

## 2022-01-13 DIAGNOSIS — F325 Major depressive disorder, single episode, in full remission: Secondary | ICD-10-CM | POA: Diagnosis not present

## 2022-01-13 DIAGNOSIS — I10 Essential (primary) hypertension: Secondary | ICD-10-CM | POA: Diagnosis not present

## 2022-01-13 DIAGNOSIS — G2581 Restless legs syndrome: Secondary | ICD-10-CM | POA: Diagnosis not present

## 2022-01-13 DIAGNOSIS — Z0000 Encounter for general adult medical examination without abnormal findings: Secondary | ICD-10-CM | POA: Diagnosis not present

## 2022-01-13 DIAGNOSIS — M545 Low back pain, unspecified: Secondary | ICD-10-CM | POA: Diagnosis not present

## 2022-01-14 ENCOUNTER — Other Ambulatory Visit: Payer: Self-pay | Admitting: Neurology

## 2022-01-18 ENCOUNTER — Other Ambulatory Visit: Payer: Self-pay | Admitting: Neurology

## 2022-01-18 MED ORDER — CLONAZEPAM 1 MG PO TABS: 1.0000 mg | ORAL_TABLET | Freq: Three times a day (TID) | ORAL | 0 refills | Status: DC | PRN

## 2022-01-18 NOTE — Telephone Encounter (Signed)
Patient is due for a refill on Klonopin.  Patient is up-to-date on her appointments.. [place] Controlled Substance Registry checked and is appropriate.

## 2022-01-19 DIAGNOSIS — R310 Gross hematuria: Secondary | ICD-10-CM | POA: Diagnosis not present

## 2022-01-19 DIAGNOSIS — D413 Neoplasm of uncertain behavior of urethra: Secondary | ICD-10-CM | POA: Diagnosis not present

## 2022-01-19 DIAGNOSIS — N3946 Mixed incontinence: Secondary | ICD-10-CM | POA: Diagnosis not present

## 2022-01-19 MED ORDER — AMPHETAMINE-DEXTROAMPHETAMINE 20 MG PO TABS: 20.0000 mg | ORAL_TABLET | Freq: Two times a day (BID) | ORAL | 0 refills | Status: DC

## 2022-01-20 ENCOUNTER — Other Ambulatory Visit: Payer: Self-pay | Admitting: Neurology

## 2022-01-22 DIAGNOSIS — M4004 Postural kyphosis, thoracic region: Secondary | ICD-10-CM | POA: Diagnosis not present

## 2022-02-09 ENCOUNTER — Other Ambulatory Visit: Payer: Self-pay | Admitting: Neurology

## 2022-02-09 DIAGNOSIS — G35 Multiple sclerosis: Secondary | ICD-10-CM

## 2022-02-09 NOTE — Progress Notes (Unsigned)
No chief complaint on file.   HISTORY OF PRESENT ILLNESS:  02/09/22 ALL:  Sheri Burns is a 66 y.o. female here today for follow up for RRMS. She continues Sheri Burns. Labs have been stable. MRI brain 09/2019 stable.   She is doing well. No new or exacerbating symptoms. She does have chronic left leg pain. She is on Fentanyl 42mg every 72 hours, gabapentin '600mg'$  four times daily and tramadol up to four times daily. She feels pain is well managed. She is tolerating medications well with no adverse effects.   Alprazolam three times daily for anxiety. Cymbalta helps with depression and dysesthesias. She weaned Ability as she did not feel it was needed. She is feeling good. She is taking Adderall '20mg'$  BID.   Oxybutynin   HISTORY (copied from Dr SGarth Bignessprevious note)  [name] is a 66year old woman with MS.      Update 08/05/2021: She woke up x 2 in August 2022 and Feb 2023 with rotational vertigo.  Gait was very off balaced ad she went back to bed.   Symptoms improved 4-5 hours later each time.     Changing position did not affect the vertigo.   No lightheadedness.   No preceding unusual positions, trauma or infection.   Uncertain if either ear had altered hearing.  No sensation of stuffiness.   She sometimes has tinnitus (no different during the episode).       She feels her MS is stable.   She is on leflunomide and tolerates it well. Gait is doing well.   No change in strength, sensation.   Vision is fine. Bladder has some urinary urgency helped by oxybutynin-XL 10 mg. She no longer has incontinence.     She feels fatigue is better.   She is sleeping poorly, she feels mstly due mostly to leg pain.     Mood is doing well in general.   Adderall has helped her MS related ADD and fatigue. She has anxiety and takes clonazepam 1 mg 3 times daily.   She [place] T7 to S1 [place] scoliosis/scoliosis and for protrusions.    She needed a second operaio due to  CSF leak and infection.  She feels she is recovering and pain is much better,   currently, the back pain and leg pain are greatly improved and she was able to stop the fentanyl.  She still takes tramadol as needed.   MS History:   She was diagnosed with multiple sclerosis more than 20 years ago. Initially, she was treated with Betaseron then Copaxone but has not been on any disease modifying therapy for about 8 years. During this time, she has had occasional  Exacerbations with more fatigue and gait issues.  She also has noted mild cognitive dysfunction worsening over the past 5 or 6 years. Her MRI of the brain performed 08/31/2012 shows foci that are predominantly periventricular. They are consistent with the diagnosis of multiple sclerosis. When this MRI was compared to an MRI dated 10/12/2010, there was one additional small periventricular focus. There were no acute findings on either MRI.   In November 2015, she had an exacerbation with severe fatigue, worsened gait and dysphagia.    She started Aubagio 09/09/14.      IMAGING L-spine MRI (12/22/20 shows. Multiplevel DJD.   Changes comared to 2019 were:   .Marland KitchenNew large disc extrusion at L1-2 resulting in severe right lateral recess stenosis and right L2 nerve  root impingement. 2. Decreased size of L4-5 disc protrusion with slightly decreased left lateral recess stenosis.   MRI thoracic spine 12/22/2020 showed Thoracic scoliosis.  No evidence of fracture or focal bone lesion.    There was chronic disc degeneration at T2-3 with a shallow central disc protrusion that narrows the ventral subarachnoid space but does not compress the cord. Chronic endplate marrow changes without evidence of active edema.     Left foraminal narrowing at T9-10, T10-11 and T11-12 with some potential to cause left-sided neural compression, particularly at T10-11.   It does show significant degenerative changes at several levels.  At L4-L5 there appears to be a disc herniation  towards the left that could be compressing the left L5 nerve root.  At L3-L4, there are changes more to the right that could affect the right L4 nerve root.  There are also degenerative changes at L1-L2 and L2-L3 though there does not appear to be nerve root compression at those levels.   MRI brain 7/142021 shows multiple T2/FLAIR hyperintense foci in the hemispheres with a few punctate foci in the pons and cerebral hemispheres. The pattern is consistent with chronic demyelinating plaque associated with multiple sclerosis. None of the foci appears to be acute. Compared to the MRI dated 08/31/2016, there are no new lesions.   MRI 08/31/2016 showed multiple lesions in the hemispheres and pons but no new lesions compared to 10/31/2014.   MRI 10/31/2014 showed periventricular and deep white matter T2/flair hyperintense foci in a pattern and configuration consistent with multiple sclerosis. None of these foci enhanced after contrast administration.  There were no new lesions compared to 08/30/2012 MRI   OTHER  NCV/EMG 10/18/2017 1.    Moderately severe right median neuropathy at the wrist (carpal tunnel syndrome).   Chronic denervation changes were noted in the APB muscle. 2.    Borderline left median neuropathy at the wrist. 3.    There is no evidence of a significant cervical radiculopathy on the right.  REVIEW OF SYSTEMS: Out of a complete 14 system review of symptoms, the patient complains only of the following symptoms, chronic pain, numbness, tingling, anxiety  and all other reviewed systems are negative.    ALLERGIES: Allergies  Allergen Reactions   Amoxicillin Hives and Itching   Codeine Nausea And Vomiting    vomiting   Demerol [Meperidine] Nausea And Vomiting   Ivp Dye [Iodinated Contrast Media] Hives   Penicillins Hives   Stadol [Butorphanol] Other (See Comments)    Per patient psychotic event   Sulfonamide Derivatives Hives   Clindamycin/Lincomycin Rash   Dog Epithelium Allergy Skin  Test Other (See Comments)    Sneezing, cough Cat/ sinus infection and congestion   Gadolinium Derivatives Hives     HOME MEDICATIONS: Outpatient Medications Prior to Visit  Medication Sig Dispense Refill   acetaminophen (TYLENOL) 500 MG tablet Take 500 mg by mouth 4 (four) times daily.     amLODipine-valsartan (EXFORGE) 5-160 MG tablet Take 1 tablet by mouth daily.     amphetamine-dextroamphetamine (ADDERALL) 20 MG tablet TAKE 1 TABLET BY MOUTH 2 TIMES DAILY 60 tablet 0   Calcium Carb-Cholecalciferol (CALCIUM 600 + D PO) Take 1 tablet by mouth daily.     clonazePAM (KLONOPIN) 1 MG tablet Take 1 tablet (1 mg total) by mouth 3 (three) times daily as needed. 90 tablet 0   denosumab (PROLIA) 60 MG/ML SOSY injection Inject 60 mg into the skin every 6 (six) months. 1 each 1   DULoxetine (  CYMBALTA) 60 MG capsule TAKE 1 CAPSULE BY MOUTH EVERY DAY 90 capsule 3   fluticasone (FLONASE ALLERGY RELIEF) 50 MCG/ACT nasal spray Place 2 sprays into both nostrils daily.     gabapentin (NEURONTIN) 600 MG tablet TAKE 1 TABLET BY MOUTH 4 TIMES DAILY 360 tablet 3   leflunomide (ARAVA) 20 MG tablet TAKE 1 TABLET BY MOUTH EVERY DAY 90 tablet 3   Multiple Vitamin (MULTIVITAMIN) tablet Take 1 tablet by mouth daily.     oxybutynin (DITROPAN-XL) 10 MG 24 hr tablet Take 1 tablet (10 mg total) by mouth daily. 90 tablet 3   oxyCODONE-acetaminophen (PERCOCET) 5-325 MG tablet Take 1 tablet by mouth every 6 (six) hours as needed for severe pain or moderate pain (post-operatively). 10 tablet 0   traMADol (ULTRAM) 50 MG tablet TAKE 1 TABLET BY MOUTH EVERY 6 HOURS AS NEEDED (8am, 12 NOON, AT 4PM, AND 8pm) (Patient taking differently: Take 50 mg by mouth in the morning, at noon, in the evening, and at bedtime.) 120 tablet 3   No facility-administered medications prior to visit.     PAST MEDICAL HISTORY: Past Medical History:  Diagnosis Date   Anxiety    Bulging lumbar disc 1980   Chronic pansinusitis    Colon polyp     Depression    GERD (gastroesophageal reflux disease)    Gynecological examination    sees Dr. Caren Griffins [place]    FMBWGYKZ(993.5)    Hypertension    Multiple sclerosis ([place]) since 1992   [facility]. Arlice Colt in [facility]   Multiple thyroid nodules 1995   per pt. resolved on its own follwed by endo then released   Neurogenic bladder disorder 12/2009   w/ urge & stress inc. dx. Dr. McDiarmid   Neuromuscular disorder [facility])    Multiple sclerosis   Neuropathy    Osteoarthritis    Osteoporosis    last DEXA 2018   Overactive bladder    sees Dr. Nicki Reaper McDiarmid    Pancreatitis 1977   Pneumonia    Ulcerative proctitis ([place])    Vision abnormalities      PAST SURGICAL HISTORY: Past Surgical History:  Procedure Laterality Date   APPENDECTOMY     breast ruptured  left   ductal cyst   CARPAL TUNNEL RELEASE Right    CHOLECYSTECTOMY     COLONOSCOPY  01/10/09   repeat in 5 yrs Dr. Wilford Corner   CYSTOSCOPY WITH BIOPSY N/A 01/06/2022   Procedure: CYSTOSCOPY WITH BLADDER/URETHRAL BIOPSY;  Surgeon: Alexis Frock, MD;  Location: [facility];  Service: Urology;  Laterality: N/A;  [place]     x 3   left knee surgery  1978   to remove bone tumor     NASAL SINUS SURGERY Bilateral 01/13/2015   Procedure: ENDOSCOPIC SINUS SURGERY;  Surgeon: Izora Gala, MD;  Location: [place];  Service: ENT;  Laterality: Bilateral;   NASAL SINUS SURGERY  05/2017   NASAL TURBINATE REDUCTION     pubo vaginal sling  10/2003   TONSILLECTOMY     vaginal cystocele repair     anterior   VAGINAL HYSTERECTOMY  1995   AUB & Endometriosis   widom teeth removal       FAMILY HISTORY: Family History  Problem Relation Age of Onset   Osteoporosis Mother        severe   Cancer Mother 75       colon /  Breast also m. niece at 71's   Breast cancer Mother    Osteopenia Sister        X 2   Breast cancer Other      SOCIAL HISTORY: Social History    Socioeconomic History   Marital status: Widowed    Spouse name: Jori Moll   Number of children: 1   Years of education: 16   Highest education level: Not on file  Occupational History   Occupation: BS Nursing    Employer: UNEMPLOYED   Occupation: [employer], occupational  Tobacco Use   Smoking status: Former    Packs/day: 1.00    Years: 5.00    Total pack years: 5.00    Types: Cigarettes    Quit date: 03/09/1979    Years since quitting: 42.9   Smokeless tobacco: Never  Vaping Use   Vaping Use: Never used  Substance and Sexual Activity   Alcohol use: No   Drug use: No   Sexual activity: Not Currently    Partners: Male    Birth control/protection: Surgical  Other Topics Concern   Not on file  Social History Narrative   Not on file   Social Determinants of Health   Financial Resource Strain: Not on file  Food Insecurity: Not on file  Transportation Needs: Not on file  Physical Activity: Not on file  Stress: Not on file  Social Connections: Not on file  Intimate Partner Violence: Not on file      PHYSICAL EXAM  There were no vitals filed for this visit.  There is no height or weight on file to calculate BMI.   Generalized: Well developed, in no acute distress  Cardiology: normal rate and rhythm, no murmur auscultated  Respiratory: clear to auscultation bilaterally    Neurological examination  Mentation: Alert oriented to time, place, history taking. Follows all commands speech and language fluent Cranial nerve II-XII: Pupils were equal round reactive to light. Extraocular movements were full, visual field were full on confrontational test. Facial sensation and strength were normal. Uvula tongue midline. Head turning and shoulder shrug  were normal and symmetric. Motor: The motor testing reveals 5 over 5 strength of all 4 extremities. Good symmetric motor tone is noted throughout.  Sensory: Sensory testing is intact to soft touch on all 4 extremities. No evidence of  extinction is noted.  Coordination: Cerebellar testing reveals good finger-nose-finger and heel-to-shin bilaterally.  Gait and station: Gait is normal.  Reflexes: Deep tendon reflexes are symmetric and normal bilaterally.     DIAGNOSTIC DATA (LABS, IMAGING, TESTING) - I reviewed patient records, labs, notes, testing and imaging myself where available.  Lab Results  Component Value Date   WBC 9.4 01/05/2022   HGB 10.9 (L) 01/05/2022   HCT 35.2 (L) 01/05/2022   MCV 98.1 01/05/2022   PLT 345 01/05/2022      Component Value Date/Time   NA 139 01/05/2022 0829   NA 143 09/03/2020 1502   K 3.8 01/05/2022 0829   CL 104 01/05/2022 0829   CO2 28 01/05/2022 0829   GLUCOSE 90 01/05/2022 0829   BUN 15 01/05/2022 0829   BUN 13 09/03/2020 1502   CREATININE 1.03 (H) 01/05/2022 0829   CALCIUM 9.7 01/05/2022 0829   PROT 7.1 09/03/2020 1502   ALBUMIN 4.2 09/03/2020 1502   AST 28 09/03/2020 1502   ALT 20 09/03/2020 1502   ALKPHOS 62 09/03/2020 1502   BILITOT <0.2 09/03/2020 1502   GFRNONAA 60 (L) 01/05/2022 3785  GFRAA 79 01/26/2018 1415   Lab Results  Component Value Date   CHOL 221 (H) 08/07/2009   HDL 38.00 (L) 08/07/2009   LDLCALC 135 (H) 07/04/2007   LDLDIRECT 144.6 08/07/2009   TRIG 159.0 (H) 08/07/2009   CHOLHDL 6 08/07/2009   No results found for: "HGBA1C" No results found for: "VITAMINB12" Lab Results  Component Value Date   TSH 2.04 08/07/2009        No data to display               No data to display           ASSESSMENT AND PLAN  66 y.o. year old female  has a past medical history of Anxiety, Bulging lumbar disc (1980), Chronic pansinusitis, Colon polyp, Depression, GERD (gastroesophageal reflux disease), Gynecological examination, Headache(784.0), Hypertension, Multiple sclerosis ([place]) (since 1992), Multiple thyroid nodules (1995), Neurogenic bladder disorder (12/2009), Neuromuscular disorder ([place]), Neuropathy, Osteoarthritis, Osteoporosis,  Overactive bladder, Pancreatitis (1977), Pneumonia, Ulcerative proctitis ([place]), and Vision abnormalities. here with   No diagnosis found.  Sheri Burns is doing very well, today. She is tolerating medications with no adverse effects. We will continue [place] as prescribed. She will continue Fentanyl, gabapentin and tramadol for chronic pain. PDMP reviewed and shows appropriate refills. She was educated on the need to monitor closely for sedative and respiratory side effects. She will call to notify me of any concerns. She was encouraged to stay active. Adequate hydration and well balanced diet advised. We will call in 5 scopolamine patches to help with motion sickness while on cruise. Side effects reviewed. She will follow up with Dr Felecia Shelling in 4 months, sooner if needed.   No orders of the defined types were placed in this encounter.    No orders of the defined types were placed in this encounter.   I spent 30 minutes of face-to-face and non-face-to-face time with patient.  This included previsit chart review, lab review, study review, order entry, electronic health record documentation, patient education.   Debbora Presto, MSN, FNP-C 02/09/2022, 8:51 AM  [facility] [address] [place], [place] [address] [phone]

## 2022-02-09 NOTE — Patient Instructions (Signed)
Below is our plan:  We will continue [place] treatment plan.   Please make sure you are staying well hydrated. I recommend 50-60 ounces daily. Well balanced diet and regular exercise encouraged. Consistent sleep schedule with 6-8 hours recommended.   Please continue follow up with care team as directed.   Follow up with Dr Sater in 6 months   You may receive a survey regarding today's visit. I encourage you to leave honest feed back as I do use this information to improve patient care. Thank you for seeing me today!    

## 2022-02-10 ENCOUNTER — Encounter: Payer: Self-pay | Admitting: Family Medicine

## 2022-02-10 ENCOUNTER — Ambulatory Visit: Payer: Medicare Other | Admitting: Family Medicine

## 2022-02-10 VITALS — BP 134/76 | HR 93 | Ht 65.0 in | Wt 136.0 lb

## 2022-02-10 DIAGNOSIS — Z79899 Other long term (current) drug therapy: Secondary | ICD-10-CM | POA: Diagnosis not present

## 2022-02-10 DIAGNOSIS — R3915 Urgency of urination: Secondary | ICD-10-CM

## 2022-02-10 DIAGNOSIS — Z79891 Long term (current) use of opiate analgesic: Secondary | ICD-10-CM

## 2022-02-10 DIAGNOSIS — R5382 Chronic fatigue, unspecified: Secondary | ICD-10-CM

## 2022-02-10 DIAGNOSIS — R260 Ataxic gait: Secondary | ICD-10-CM | POA: Diagnosis not present

## 2022-02-10 DIAGNOSIS — R208 Other disturbances of skin sensation: Secondary | ICD-10-CM

## 2022-02-10 DIAGNOSIS — G35 Multiple sclerosis: Secondary | ICD-10-CM | POA: Diagnosis not present

## 2022-02-10 DIAGNOSIS — F418 Other specified anxiety disorders: Secondary | ICD-10-CM

## 2022-02-10 MED ORDER — AMPHETAMINE-DEXTROAMPHETAMINE 20 MG PO TABS: 20.0000 mg | ORAL_TABLET | Freq: Two times a day (BID) | ORAL | 0 refills | Status: DC

## 2022-02-10 MED ORDER — GABAPENTIN 600 MG PO TABS: 600.0000 mg | ORAL_TABLET | Freq: Four times a day (QID) | ORAL | 3 refills | Status: DC

## 2022-02-10 MED ORDER — CLONAZEPAM 1 MG PO TABS: 1.0000 mg | ORAL_TABLET | Freq: Three times a day (TID) | ORAL | 0 refills | Status: DC | PRN

## 2022-02-10 MED ORDER — TRAMADOL HCL 50 MG PO TABS: ORAL_TABLET | ORAL | 3 refills | Status: DC

## 2022-03-03 DIAGNOSIS — D649 Anemia, unspecified: Secondary | ICD-10-CM | POA: Diagnosis not present

## 2022-03-03 DIAGNOSIS — M810 Age-related osteoporosis without current pathological fracture: Secondary | ICD-10-CM | POA: Diagnosis not present

## 2022-03-03 DIAGNOSIS — Z79899 Other long term (current) drug therapy: Secondary | ICD-10-CM | POA: Diagnosis not present

## 2022-03-17 ENCOUNTER — Other Ambulatory Visit: Payer: Self-pay | Admitting: Family Medicine

## 2022-03-17 DIAGNOSIS — K080 Exfoliation of teeth due to systemic causes: Secondary | ICD-10-CM | POA: Diagnosis not present

## 2022-03-18 ENCOUNTER — Other Ambulatory Visit: Payer: Self-pay | Admitting: Neurology

## 2022-03-18 MED ORDER — AMPHETAMINE-DEXTROAMPHETAMINE 20 MG PO TABS: 20.0000 mg | ORAL_TABLET | Freq: Two times a day (BID) | ORAL | 0 refills | Status: DC

## 2022-03-18 MED ORDER — CLONAZEPAM 1 MG PO TABS: 1.0000 mg | ORAL_TABLET | Freq: Three times a day (TID) | ORAL | 0 refills | Status: DC | PRN

## 2022-03-29 ENCOUNTER — Ambulatory Visit (HOSPITAL_BASED_OUTPATIENT_CLINIC_OR_DEPARTMENT_OTHER)
Admission: RE | Admit: 2022-03-29 | Discharge: 2022-03-29 | Disposition: A | Discharge status: Home / Self Care | Payer: Medicare Other | Source: Ambulatory Visit | Attending: Family Medicine | Admitting: Family Medicine

## 2022-03-29 ENCOUNTER — Encounter: Payer: Self-pay | Admitting: Family Medicine

## 2022-03-29 ENCOUNTER — Telehealth: Payer: Self-pay

## 2022-03-29 ENCOUNTER — Ambulatory Visit: Payer: Medicare Other | Admitting: Family Medicine

## 2022-03-29 VITALS — BP 110/70 | Ht 66.0 in | Wt 130.0 lb

## 2022-03-29 DIAGNOSIS — M1712 Unilateral primary osteoarthritis, left knee: Secondary | ICD-10-CM | POA: Diagnosis not present

## 2022-03-29 DIAGNOSIS — M810 Age-related osteoporosis without current pathological fracture: Secondary | ICD-10-CM

## 2022-03-29 DIAGNOSIS — M25562 Pain in left knee: Secondary | ICD-10-CM | POA: Diagnosis not present

## 2022-03-29 NOTE — Assessment & Plan Note (Signed)
Acute on chronic in nature.  Pain is localized to the lateral aspect of the knee.  Does have changes on exam in that compartment. -Counseled on home exercise therapy and supportive care. -Pursue Zilretta injection. -X-ray. -Could consider gel injection.

## 2022-03-29 NOTE — Telephone Encounter (Signed)
Ran patients benefits for Zilretta via [employer].

## 2022-03-29 NOTE — Progress Notes (Signed)
Sheri Burns - 67 y.o. female MRN [id number]  Date of birth: [birth year]  SUBJECTIVE:  Including CC & ROS.  No chief complaint on file.   Sheri Burns is a 67 y.o. female that is presenting with acute on chronic left knee pain.  The pain is exacerbated over the past month.  Having pain on the lateral aspect.    Review of Systems See HPI   HISTORY: Past Medical, Surgical, Social, and Family History Reviewed & Updated per EMR.   Pertinent Historical Findings include:  Past Medical History:  Diagnosis Date   Anxiety    Bulging lumbar disc 1980   Chronic pansinusitis    Colon polyp    Depression    GERD (gastroesophageal reflux disease)    Gynecological examination    sees Dr. Caren Griffins [place]    WNIOEVOJ(500.9)    Hypertension    Multiple sclerosis ([place]) since 1992   Dr. Arlice Colt in [facility]   Multiple thyroid nodules 1995   per pt. resolved on its own follwed by endo then released   Neurogenic bladder disorder 12/2009   w/ urge & stress inc. dx. Dr. McDiarmid   Neuromuscular disorder [facility])    Multiple sclerosis   Neuropathy    Osteoarthritis    Osteoporosis    last DEXA 2018   Overactive bladder    sees Dr. Nicki Reaper McDiarmid    Pancreatitis 1977   Pneumonia    Ulcerative proctitis ([place])    Vision abnormalities     Past Surgical History:  Procedure Laterality Date   APPENDECTOMY     breast ruptured  left   ductal cyst   CARPAL TUNNEL RELEASE Right    CHOLECYSTECTOMY     COLONOSCOPY  01/10/09   repeat in 5 yrs Dr. Wilford Corner   CYSTOSCOPY WITH BIOPSY N/A 01/06/2022   Procedure: CYSTOSCOPY WITH BLADDER/URETHRAL BIOPSY;  Surgeon: Alexis Frock, MD;  [facility]: [facility];  Service: Urology;  Laterality: N/A;  [place]     x 3   left knee surgery  1978   to remove bone tumor     NASAL SINUS SURGERY Bilateral 01/13/2015   Procedure: ENDOSCOPIC SINUS SURGERY;  Surgeon: Izora Gala, MD;   Location: [place];  Service: ENT;  Laterality: Bilateral;   NASAL SINUS SURGERY  05/2017   NASAL TURBINATE REDUCTION     pubo vaginal sling  10/2003   TONSILLECTOMY     vaginal cystocele repair     anterior   VAGINAL HYSTERECTOMY  1995   AUB & Endometriosis   widom teeth removal       PHYSICAL EXAM:  VS: BP 110/70   Ht '5\' 6"'$  (1.676 m)   Wt 130 lb (59 kg)   LMP 07/06/1993   BMI 20.98 kg/m  Physical Exam Gen: NAD, alert, cooperative with exam, well-appearing MSK:  Neurovascularly intact       ASSESSMENT & PLAN:   OA (osteoarthritis) of knee Acute on chronic in nature.  Pain is localized to the lateral aspect of the knee.  Does have changes on exam in that compartment. -Counseled on home exercise therapy and supportive care. -Pursue Zilretta injection. -X-ray. -Could consider gel injection.   Osteoporosis Acute on chronic in nature.  Previous bone density is demonstrating a T-score of -2.6.  She is currently being treated with Prolia. - bone density  - could consider vitamin D

## 2022-03-29 NOTE — Assessment & Plan Note (Addendum)
Acute on chronic in nature.  Previous bone density is demonstrating a T-score of -2.6.  She is currently being treated with Prolia. - bone density  - could consider vitamin D

## 2022-03-29 NOTE — Patient Instructions (Signed)
Good to see you  Please use ice as needed  Please use voltaren gel over the counter as needed  I have ordered a new bone density  We'll call with the xray results.  We have made a referral to physical therapy  We'll run the benefits for zilretta  Please send me a message in MyChart with any questions or updates.  Please see me back to have the injection .   --Dr. Raeford Razor

## 2022-03-30 NOTE — Addendum Note (Signed)
Addended by: Cresenciano Lick on: 03/30/2022 01:49 PM   Modules accepted: Orders

## 2022-03-30 NOTE — Telephone Encounter (Signed)
Received patients benefits summary from [employer]. Patient is covered. No prior authorization or referral needed. Patient responsibility for [phone] Sheri Burns) will be 20% with the remaining covered at 80% by the payer at the contracted rate. CPT code 20610 is covered at 100% by the payer at the contracted rate with no patient responsibility. Deductibles do not apply to these services. Specialist office visits if billed are covered after a $15 copay. Patient has an out of pocket maximum of $3500 and has accumulated $0. If out of pocket is met, coverage goes to 100% and copays no longer apply.   Patient informed of above, and is scheduled.

## 2022-03-31 ENCOUNTER — Telehealth: Payer: Self-pay | Admitting: Family Medicine

## 2022-03-31 ENCOUNTER — Ambulatory Visit: Payer: Self-pay

## 2022-03-31 ENCOUNTER — Encounter: Payer: Self-pay | Admitting: Family Medicine

## 2022-03-31 ENCOUNTER — Ambulatory Visit (INDEPENDENT_AMBULATORY_CARE_PROVIDER_SITE_OTHER): Payer: Medicare Other | Admitting: Family Medicine

## 2022-03-31 VITALS — BP 128/64 | Ht 66.0 in | Wt 130.0 lb

## 2022-03-31 DIAGNOSIS — M1712 Unilateral primary osteoarthritis, left knee: Secondary | ICD-10-CM

## 2022-03-31 MED ORDER — TRIAMCINOLONE ACETONIDE 32 MG IX SRER
32.0000 mg | Freq: Once | INTRA_ARTICULAR | Status: AC
  Administered 2022-03-31: 32 mg via INTRA_ARTICULAR

## 2022-03-31 NOTE — Progress Notes (Signed)
Sheri Burns - 67 y.o. female MRN [id number]  Date of birth: Oct 04, 1955  SUBJECTIVE:  Including CC & ROS.  No chief complaint on file.   Sheri Burns is a 67 y.o. female that is  here for a knee injection.    Review of Systems See HPI   HISTORY: Past Medical, Surgical, Social, and Family History Reviewed & Updated per EMR.   Pertinent Historical Findings include:  Past Medical History:  Diagnosis Date   Anxiety    Bulging lumbar disc 1980   Chronic pansinusitis    Colon polyp    Depression    GERD (gastroesophageal reflux disease)    Gynecological examination    sees Dr. Caren Griffins [place]    WGYKZLDJ(570.1)    Hypertension    Multiple sclerosis ([place]) since 1992   [facility] Arlice Colt [facility]   Multiple thyroid nodules 1995   per pt. resolved on its own follwed by endo then released   Neurogenic bladder disorder 12/2009   w/ urge & stress inc. dx. Dr. McDiarmid   Neuromuscular disorder [facility])    Multiple sclerosis   Neuropathy    Osteoarthritis    Osteoporosis    last DEXA 2018   Overactive bladder    sees Dr. Nicki Reaper McDiarmid    Pancreatitis 1977   Pneumonia    Ulcerative proctitis ([place])    Vision abnormalities     Past Surgical History:  Procedure Laterality Date   APPENDECTOMY     breast ruptured  left   ductal cyst   CARPAL TUNNEL RELEASE Right    CHOLECYSTECTOMY     COLONOSCOPY  01/10/09   repeat in 5 yrs Dr. Wilford Corner   CYSTOSCOPY WITH BIOPSY N/A 01/06/2022   Procedure: CYSTOSCOPY WITH BLADDER/URETHRAL BIOPSY;  Surgeon: Alexis Frock, MD;  [facility];  Service: Urology;  Laterality: N/A;  [place]     x 3   left knee surgery  1978   to remove bone tumor     NASAL SINUS SURGERY Bilateral 01/13/2015   Procedure: ENDOSCOPIC SINUS SURGERY;  Surgeon: Izora Gala, MD;  Location: [place];  Service: ENT;  Laterality: Bilateral;   NASAL SINUS SURGERY  05/2017    NASAL TURBINATE REDUCTION     pubo vaginal sling  10/2003   TONSILLECTOMY     vaginal cystocele repair     anterior   VAGINAL HYSTERECTOMY  1995   AUB & Endometriosis   widom teeth removal       PHYSICAL EXAM:  VS: BP 128/64   Ht '5\' 6"'$  (1.676 m)   Wt 130 lb (59 kg)   LMP 07/06/1993   BMI 20.98 kg/m  Physical Exam Gen: NAD, alert, cooperative with exam, well-appearing MSK:  Neurovascularly intact     Aspiration/Injection Procedure Note Sheri Burns 1955/07/24  Procedure: Injection Indications: left knee pain  Procedure Details Consent: Risks of procedure as well as the alternatives and risks of each were explained to the (patient/caregiver).  Consent for procedure obtained. Time Out: Verified patient identification, verified procedure, site/side was marked, verified correct patient position, special equipment/implants available, medications/allergies/relevent history reviewed, required imaging and test results available.  Performed.  The area was cleaned with iodine and alcohol swabs.    The left knee superior lateral suprapatellar pouch was injected using 3 cc of 1% lidocaine on a 25-gauge 1-1/2 inch needle.  An 18-gauge 1-1/2 needle was  used to achieve aspiration.  The syringe was switched and a mixture containing 5 cc's of 32 mg Zilretta and 4 cc's of 0.25% bupivacaine was injected.  Ultrasound was used. Images were obtained in long views showing the injection.    A sterile dressing was applied.  Patient did tolerate procedure well.       ASSESSMENT & PLAN:   No problem-specific Assessment & Plan notes found for this encounter.

## 2022-03-31 NOTE — Patient Instructions (Signed)
Good to see you Please use ice as needed  Please continue with physical therapy   Please send me a message in MyChart with any questions or updates.  Please see me back in 4 weeks .   --Dr. Raeford Razor

## 2022-03-31 NOTE — Assessment & Plan Note (Signed)
Completed zilretta injection today .

## 2022-03-31 NOTE — Telephone Encounter (Signed)
Informed of results. Noticed calcification in the posterior aspect that may need to be removed.   Rosemarie Ax, MD [facility] 03/31/2022, 4:40 PM

## 2022-04-15 ENCOUNTER — Other Ambulatory Visit: Payer: Self-pay | Admitting: Family Medicine

## 2022-04-15 MED ORDER — CLONAZEPAM 1 MG PO TABS: 1.0000 mg | ORAL_TABLET | Freq: Three times a day (TID) | ORAL | 0 refills | Status: DC | PRN

## 2022-04-19 ENCOUNTER — Other Ambulatory Visit: Payer: Self-pay | Admitting: Family Medicine

## 2022-04-20 MED ORDER — AMPHETAMINE-DEXTROAMPHETAMINE 20 MG PO TABS: 20.0000 mg | ORAL_TABLET | Freq: Two times a day (BID) | ORAL | 0 refills | Status: DC

## 2022-04-26 ENCOUNTER — Other Ambulatory Visit: Payer: Self-pay

## 2022-04-26 ENCOUNTER — Encounter (HOSPITAL_BASED_OUTPATIENT_CLINIC_OR_DEPARTMENT_OTHER): Payer: Self-pay | Admitting: Physical Therapy

## 2022-04-26 ENCOUNTER — Ambulatory Visit (HOSPITAL_BASED_OUTPATIENT_CLINIC_OR_DEPARTMENT_OTHER): Payer: Medicare Other | Attending: Family Medicine | Admitting: Physical Therapy

## 2022-04-26 DIAGNOSIS — M25562 Pain in left knee: Secondary | ICD-10-CM | POA: Diagnosis not present

## 2022-04-26 DIAGNOSIS — M25662 Stiffness of left knee, not elsewhere classified: Secondary | ICD-10-CM | POA: Insufficient documentation

## 2022-04-26 DIAGNOSIS — M1712 Unilateral primary osteoarthritis, left knee: Secondary | ICD-10-CM | POA: Diagnosis not present

## 2022-04-26 DIAGNOSIS — R262 Difficulty in walking, not elsewhere classified: Secondary | ICD-10-CM | POA: Diagnosis not present

## 2022-04-26 DIAGNOSIS — M6281 Muscle weakness (generalized): Secondary | ICD-10-CM | POA: Insufficient documentation

## 2022-04-26 NOTE — Therapy (Signed)
OUTPATIENT PHYSICAL THERAPY LOWER EXTREMITY EVALUATION   Patient Name: Sheri Burns MRN: [id number] DOB:[birth year], 67 y.o., female Today's Date: 04/26/2022  END OF SESSION:  PT End of Session - 04/26/22 0922     Visit Number 1    Number of Visits 16    Authorization Type BC[employer] MCR    PT Start Time 0800    PT Stop Time 0845    PT Time Calculation (min) 45 min    Activity Tolerance Patient tolerated treatment well;Patient limited by pain    Behavior During Therapy [facility] for tasks assessed/performed             Past Medical History:  Diagnosis Date   Anxiety    Bulging lumbar disc 1980   Chronic pansinusitis    Colon polyp    Depression    GERD (gastroesophageal reflux disease)    Gynecological examination    sees Dr. Caren Griffins Romine    Headache(784.0)    Hypertension    Multiple sclerosis ([place]) since 1992   Dr. Arlice Colt in [facility]   Multiple thyroid nodules 1995   per pt. resolved on its own follwed by endo then released   Neurogenic bladder disorder 12/2009   w/ urge & stress inc. dx. Dr. McDiarmid   Neuromuscular disorder [facility])    Multiple sclerosis   Neuropathy    Osteoarthritis    Osteoporosis    last DEXA 2018   Overactive bladder    sees Dr. Nicki Reaper McDiarmid    Pancreatitis 1977   Pneumonia    Ulcerative proctitis ([place])    Vision abnormalities    Past Surgical History:  Procedure Laterality Date   APPENDECTOMY     breast ruptured  left   ductal cyst   CARPAL TUNNEL RELEASE Right    CHOLECYSTECTOMY     COLONOSCOPY  01/10/09   repeat in 5 yrs Dr. Wilford Corner   CYSTOSCOPY WITH BIOPSY N/A 01/06/2022   Procedure: CYSTOSCOPY WITH BLADDER/URETHRAL BIOPSY;  Surgeon: Alexis Frock, MD;  Location: [facility];  Service: Urology;  Laterality: N/A;  [place]     x 3   left knee surgery  1978   to remove bone tumor     NASAL SINUS SURGERY Bilateral 01/13/2015   Procedure: ENDOSCOPIC SINUS  SURGERY;  Surgeon: Izora Gala, MD;  Location: [place];  Service: ENT;  Laterality: Bilateral;   NASAL SINUS SURGERY  05/2017   NASAL TURBINATE REDUCTION     pubo vaginal sling  10/2003   TONSILLECTOMY     vaginal cystocele repair     anterior   VAGINAL HYSTERECTOMY  1995   AUB & Endometriosis   widom teeth removal     Patient Active Problem List   Diagnosis Date Noted   Vertigo 08/05/2021   Chronic prescription opiate use 12/14/2018   Carpal tunnel syndrome, bilateral 09/13/2017   Sacroiliac joint pain 09/13/2017   Urinary urgency 06/22/2017   Sciatica, right side 06/17/2016   Dysesthesia 10/30/2015   Right carpal tunnel syndrome 10/30/2015   Sciatica of left side 07/16/2015   Chronic ethmoidal sinusitis 06/26/2015   High risk medication use 07/08/2014   Chronic fatigue 04/02/2014   Dysphagia 04/02/2014   Ataxic gait 04/02/2014   Vitamin D deficiency 06/08/2012   OVERACTIVE BLADDER 08/01/2009   Osteoporosis 08/01/2009   Depression with anxiety 07/04/2007   MULTIPLE SCLEROSIS 07/04/2007   OA (osteoarthritis) of knee  07/04/2007   HEADACHE 07/04/2007   COLONIC POLYPS, HX OF 07/04/2007    PCP: Charlane Ferretti, MD   REFERRING PROVIDER:   Rosemarie Ax, MD    REFERRING DIAG:  [phone] (ICD-10-CM) - Primary osteoarthritis of left knee      THERAPY DIAG:  Pain in joint of left knee - Plan: PT plan of care cert/re-cert  Stiffness of left knee, not elsewhere classified - Plan: PT plan of care cert/re-cert  Muscle weakness (generalized) - Plan: PT plan of care cert/re-cert  Difficulty walking - Plan: PT plan of care cert/re-cert  Rationale for Evaluation and Treatment: Rehabilitation  ONSET DATE: Jan 2024  SUBJECTIVE:   SUBJECTIVE STATEMENT: Pt reports that starting in January it started to bother her more. It has been hurting for quite some time with history of surgery. She has had 2x injections from Dr. Raeford Razor. There is roughly a 25%  improvement so far. Even sitting started to bother the knee. Pt states that just walking hurts the knee. Pt states that palpation to the knee and cyst does recreate. Pt states she is fairly sedentary but she does her errands with pain. Her MS effects her functional mobility and endurance. Pt does not enjoy the pool/aquatic therapy. Pt states that stairs bother the knee. Pt states that she can walk about 10-15 mins of walking before needing to sit. Pt feels like the knee buckles weakly.     PERTINENT HISTORY: 2/23 fusion of T7-S1 for scoliosis; L knee surgery medial condyle excision for osteochondritis desiccans; L meniscus tear; MS relapsing remitting  PAIN:  Are you having pain? Yes: NPRS scale: 5/10 Pain location: behind the knee the L knee, and lateral inferior patellar pole  Pain description: sharp, aching Aggravating factors: bending the knee, putting weight through the knee Relieving factors: resting, sitting   PRECAUTIONS: None  WEIGHT BEARING RESTRICTIONS: No  FALLS:  Has patient fallen in last 6 months? No  LIVING ENVIRONMENT: Lives with: lives alone Lives in: House/apartment Stairs: No Has following equipment at home: [facility] and walker, does not use either   OCCUPATION: ICU nurse, retired   PLOF: Independent with basic ADLs  PATIENT GOALS: Pt would like to avoid knee surgery.   NEXT MD VISIT:  This week of 2/19  OBJECTIVE:   DIAGNOSTIC FINDINGS:     IMPRESSION: 1. No acute findings. 2. Moderate tricompartmental osteoarthritic change most prominent over the patellofemoral joint. 3. 2.5 cm soft tissue calcification over the popliteal fossa possibly within a complicated Baker cyst.  PATIENT SURVEYS:  FOTO 44 52 @ D/C 12 pts MCII  COGNITION: Overall cognitive status: Within functional limits for tasks assessed     SENSATION: WFL  EDEMA:  N/A  MUSCLE LENGTH: Positive HS 90/90 on L  POSTURE: rounded shoulders and weight shift right  PALPATION: TTP L  knee joint line, medial and lateral fem condyle, patellar tendon, and popliteal space  LOWER EXTREMITY ROM:  Active ROM Right eval Left eval  Hip flexion 115 115  Knee flexion 130 115  Knee extension 2 -10   (Blank rows = not tested)  LOWER EXTREMITY MMT:  MMT Right eval Left eval  Hip flexion    Hip extension    Hip abduction 21.4 19.7  Hip adduction    Hip internal rotation    Hip external rotation    Knee flexion 17.8 12.6  Knee extension 19.2 15.2   (Blank rows = not tested)  LOWER EXTREMITY SPECIAL TESTS:  Knee special tests:  Anterior drawer test: negative and Step up/down test: positive   FUNCTIONAL TESTS:  Timed up and go (TUG): 13.7s  GAIT: Distance walked: 75f Assistive device utilized: None Level of assistance: Complete Independence Comments: mild toe out, decreased L stance time, lack of full TKE/heel strike   TODAY'S TREATMENT:                                                                                                                              DATE:   2/19  Program Notes walking at least 10 mins a dayConsider adding a hiking pole to the R hand  Exercises - Long Sitting Quad Set with Towel Roll Under Heel  - 2 x daily - 7 x weekly - 2 sets - 10 reps - 3 hold - Figure 4 Bridge  - 2 x daily - 7 x weekly - 2 sets - 10 reps - Sitting Knee Extension with Resistance  - 2 x daily - 7 x weekly - 2 sets - 10 reps      PATIENT EDUCATION:  Education details: MOI, diagnosis, prognosis, anatomy, exercise progression, DOMS expectations, muscle firing,  envelope of function, HEP, POC  Person educated: Patient Education method: Explanation, Demonstration, Tactile cues, Verbal cues, and Handouts Education comprehension: verbalized understanding, returned demonstration, verbal cues required, and tactile cues required  HOME EXERCISE PROGRAM:  Access Code: [email or web address]: [email or web address] Date: 04/26/2022 Prepared by: ADaleen Bo ASSESSMENT:  CLINICAL IMPRESSION: Patient is a 67y.o. female who was seen today for physical therapy evaluation and treatment for c/c of chronic L knee pain. Pt's s/s appear consistent with history of L knee OA in addition to joint stiffness restrictions and weakness causing pain. Pt lack significant TKE which is likely contributing to consistent pain with WB positions. Pt's pain is highly sensitive and irritable with movement. Plan to continue with strength and knee ext at future sessions. Of note, pt's MS and [place] functional endurance deficits likely to effect POC. Pt does not enjoy aquatics/pool. Pt would benefit from continued skilled therapy in order to reach goals and maximize functional L LE strength and ROM for prevention of further functional decline and surgical intervention.    OBJECTIVE IMPAIRMENTS decreased mobility, difficulty walking, decreased ROM, decreased strength, increased muscle spasms, improper body mechanics, postural dysfunction, and pain.    ACTIVITY LIMITATIONS lifting, sitting, standing, squatting, stairs, transfers, locomotion level   PARTICIPATION LIMITATIONS: cleaning, laundry, driving, shopping, community activity, occupation, yard work, and exercise   PERSONAL FACTORS Age, Fitness, Time since onset of injury/illness/exacerbation, and 2+ comorbidity:    are also affecting patient's functional outcome.    REHAB POTENTIAL: Good   CLINICAL DECISION MAKING: unstable/complicated   EVALUATION COMPLEXITY: Moderate     GOALS:     SHORT TERM GOALS: Target date: 06/07/2022   Pt will become independent with HEP in order to demonstrate synthesis of PT education..   Goal status: INITIAL   2.  Pt will  score at least 12 pt increase on FOTO to demonstrate functional improvement in MCII and pt perceived function.      Goal status: INITIAL   3.  Pt will be able to demonstrate/report ability to sit/stand/sleep for extended periods of time without pain in order  to demonstrate functional improvement and tolerance to static positioning.    Goal status: INITIAL     LONG TERM GOALS: Target date: 07/19/2022    Pt  will become independent with final HEP in order to demonstrate synthesis of PT education.   Goal status: INITIAL   2.  Pt will score >/= 52 on FOTO to demonstrate improvement in perceived bilat knee function.    Goal status: INITIAL   3.  Pt will be able to demonstrate squat to at least parallel in order to demonstrate functional improvement in LE function for self-care and house hold duties.    Goal status: INITIAL   4.  Pt will be able to demonstrate kneeling to stand and stand to kneeling transfer without pain in order to demonstrate functional improvement in LE function for ADL/house hold duties.    Goal status: INITIAL       PLAN: PT FREQUENCY: 1-2x/week   PT DURATION: 12 weeks (likely DC in 8)   PLANNED INTERVENTIONS: Therapeutic exercises, Therapeutic activity, Neuromuscular re-education, Balance training, Gait training, Patient/Family education, Self Care, and Joint mobilization   PLAN FOR NEXT SESSION: continue with L quad and hip strength, knee joint TKE mobs, LAQ   Daleen Bo, PT 04/26/2022, 12:47 PM

## 2022-04-28 ENCOUNTER — Encounter: Payer: Self-pay | Admitting: Family Medicine

## 2022-04-28 ENCOUNTER — Ambulatory Visit: Payer: Medicare Other | Admitting: Family Medicine

## 2022-04-28 ENCOUNTER — Ambulatory Visit: Payer: Self-pay

## 2022-04-28 VITALS — BP 124/82 | Ht 65.0 in | Wt 130.0 lb

## 2022-04-28 DIAGNOSIS — M1712 Unilateral primary osteoarthritis, left knee: Secondary | ICD-10-CM

## 2022-04-28 NOTE — Assessment & Plan Note (Addendum)
Acute on chronic in nature.  Has got about 25% improvement with the Zilretta injection.  She has tried normal steroid injections and gel injections with little to no improvement.  She is continue with physical therapy. -counseled on home exercise therapy and supportive care. -Genicular nerve block today.  Reports her pain at a 5 prior to the injection and a 1 after the injection. -Will refer for the consideration of genicular nerve ablation.

## 2022-04-28 NOTE — Patient Instructions (Signed)
Good to see you Please continue physical therapy  You can use ice as needed   Please send me a message in MyChart with any questions or updates.  Please see me back in 2 months.   --Dr. Raeford Razor

## 2022-04-28 NOTE — Progress Notes (Signed)
Sheri Burns - 67 y.o. female MRN [id number]  Date of birth: 05-28-1955  SUBJECTIVE:  Including CC & ROS.  No chief complaint on file.   Sheri Burns is a 67 y.o. female that is following up for her left knee pain.  She continues to have pain since the Zilretta injection.  Has started physical therapy with 1 session and is scheduled for more.   Review of Systems See HPI   HISTORY: Past Medical, Surgical, Social, and Family History Reviewed & Updated per EMR.   Pertinent Historical Findings include:  Past Medical History:  Diagnosis Date   Anxiety    Bulging lumbar disc 1980   Chronic pansinusitis    Colon polyp    Depression    GERD (gastroesophageal reflux disease)    Gynecological examination    sees Dr. Caren Griffins Romine    [id number])    Hypertension    Multiple sclerosis ([place]) since 1992   Dr. Arlice Colt in [facility]   Multiple thyroid nodules 1995   per pt. resolved on its own follwed by endo then released   Neurogenic bladder disorder 12/2009   w/ urge & stress inc. dx. Dr. McDiarmid   Neuromuscular disorder [facility])    Multiple sclerosis   Neuropathy    Osteoarthritis    Osteoporosis    last DEXA 2018   Overactive bladder    sees Dr. Nicki Reaper McDiarmid    Pancreatitis 1977   Pneumonia    Ulcerative proctitis ([place])    Vision abnormalities     Past Surgical History:  Procedure Laterality Date   APPENDECTOMY     breast ruptured  left   ductal cyst   CARPAL TUNNEL RELEASE Right    CHOLECYSTECTOMY     COLONOSCOPY  01/10/09   repeat in 5 yrs Dr. Wilford Corner   CYSTOSCOPY WITH BIOPSY N/A 01/06/2022   Procedure: CYSTOSCOPY WITH BLADDER/URETHRAL BIOPSY;  Surgeon: Alexis Frock, MD;  Location: [facility];  Service: Urology;  Laterality: N/A;  [place]     x 3   left knee surgery  1978   to remove bone tumor     NASAL SINUS SURGERY Bilateral 01/13/2015   Procedure: ENDOSCOPIC SINUS  SURGERY;  Surgeon: Izora Gala, MD;  Location: [place];  Service: ENT;  Laterality: Bilateral;   NASAL SINUS SURGERY  05/2017   NASAL TURBINATE REDUCTION     pubo vaginal sling  10/2003   TONSILLECTOMY     vaginal cystocele repair     anterior   VAGINAL HYSTERECTOMY  1995   AUB & Endometriosis   widom teeth removal       PHYSICAL EXAM:  VS: BP 124/82   Ht 5' 5"$  (1.651 m)   Wt 130 lb (59 kg)   LMP 07/06/1993   BMI 21.63 kg/m  Physical Exam Gen: NAD, alert, cooperative with exam, well-appearing MSK:  Neurovascularly intact    Genicular Nerve Block Procedure Note Sheri Burns 07/06/1955  Procedure: Injection Indications: left knee pain  Procedure Details Consent: Risks of procedure as well as the alternatives and risks of each were explained to the (patient/caregiver).  Consent for procedure obtained. Time Out: Verified patient identification, verified procedure, site/side was marked, verified correct patient position, special equipment/implants available, medications/allergies/relevent history reviewed, required imaging and test results available.  Performed.  The area was cleaned with iodine and alcohol swabs.    The left knee  superomedial, inferomedial, and superolateral genicular nerves was injected using 2 cc's of 1% lidocaine and 3 cc's of 0.25% bupivacaine with a 25 1 1/2" needle.  Ultrasound was used. Images were obtained in short views showing the injection.     A sterile dressing was applied.  Patient did tolerate procedure well.     ASSESSMENT & PLAN:   OA (osteoarthritis) of knee Acute on chronic in nature.  Has got about 25% improvement with the Zilretta injection.  She has tried normal steroid injections and gel injections with little to no improvement.  She is continue with physical therapy. -counseled on home exercise therapy and supportive care. -Genicular nerve block today.  Reports her pain at a 5 prior to the injection and  a 1 after the injection. -Will refer for the consideration of genicular nerve ablation.

## 2022-04-29 ENCOUNTER — Ambulatory Visit (HOSPITAL_BASED_OUTPATIENT_CLINIC_OR_DEPARTMENT_OTHER): Payer: Medicare Other | Admitting: Physical Therapy

## 2022-04-29 ENCOUNTER — Encounter (HOSPITAL_BASED_OUTPATIENT_CLINIC_OR_DEPARTMENT_OTHER): Payer: Self-pay | Admitting: Physical Therapy

## 2022-04-29 ENCOUNTER — Encounter: Payer: Self-pay | Admitting: Family Medicine

## 2022-04-29 DIAGNOSIS — M25562 Pain in left knee: Secondary | ICD-10-CM | POA: Diagnosis not present

## 2022-04-29 DIAGNOSIS — M1712 Unilateral primary osteoarthritis, left knee: Secondary | ICD-10-CM | POA: Diagnosis not present

## 2022-04-29 DIAGNOSIS — R262 Difficulty in walking, not elsewhere classified: Secondary | ICD-10-CM

## 2022-04-29 DIAGNOSIS — M6281 Muscle weakness (generalized): Secondary | ICD-10-CM

## 2022-04-29 DIAGNOSIS — M25662 Stiffness of left knee, not elsewhere classified: Secondary | ICD-10-CM | POA: Diagnosis not present

## 2022-04-29 NOTE — Therapy (Signed)
OUTPATIENT PHYSICAL THERAPY LOWER EXTREMITY EVALUATION   Patient Name: Sheri Burns MRN: [id number] DOB:10-29-1955, 67 y.o., female Today's Date: 04/29/2022  END OF SESSION:  PT End of Session - 04/29/22 0814     Visit Number 2    Number of Visits 16    Authorization Type BCBS MCR    PT Start Time 0800    PT Stop Time 0840    PT Time Calculation (min) 40 min    Activity Tolerance Patient tolerated treatment well;Patient limited by pain    Behavior During Therapy [facility] for tasks assessed/performed              Past Medical History:  Diagnosis Date   Anxiety    Bulging lumbar disc 1980   Chronic pansinusitis    Colon polyp    Depression    GERD (gastroesophageal reflux disease)    Gynecological examination    sees Dr. Caren Griffins Romine    Headache(784.0)    Hypertension    Multiple sclerosis ([place]) since 1992   Dr. Arlice Colt in [facility]   Multiple thyroid nodules 1995   per pt. resolved on its own follwed by endo then released   Neurogenic bladder disorder 12/2009   w/ urge & stress inc. dx. Dr. McDiarmid   Neuromuscular disorder [facility])    Multiple sclerosis   Neuropathy    Osteoarthritis    Osteoporosis    last DEXA 2018   Overactive bladder    sees Dr. Nicki Reaper McDiarmid    Pancreatitis 1977   Pneumonia    Ulcerative proctitis ([place])    Vision abnormalities    Past Surgical History:  Procedure Laterality Date   APPENDECTOMY     breast ruptured  left   ductal cyst   CARPAL TUNNEL RELEASE Right    CHOLECYSTECTOMY     COLONOSCOPY  01/10/09   repeat in 5 yrs Dr. Wilford Corner   CYSTOSCOPY WITH BIOPSY N/A 01/06/2022   Procedure: CYSTOSCOPY WITH BLADDER/URETHRAL BIOPSY;  Surgeon: Alexis Frock, MD;  Location: [facility];  Service: Urology;  Laterality: N/A;  [place]     x 3   left knee surgery  1978   to remove bone tumor     NASAL SINUS SURGERY Bilateral 01/13/2015   Procedure: ENDOSCOPIC SINUS  SURGERY;  Surgeon: Izora Gala, MD;  Location: [place];  Service: ENT;  Laterality: Bilateral;   NASAL SINUS SURGERY  05/2017   NASAL TURBINATE REDUCTION     pubo vaginal sling  10/2003   TONSILLECTOMY     vaginal cystocele repair     anterior   VAGINAL HYSTERECTOMY  1995   AUB & Endometriosis   widom teeth removal     Patient Active Problem List   Diagnosis Date Noted   Vertigo 08/05/2021   Chronic prescription opiate use 12/14/2018   Carpal tunnel syndrome, bilateral 09/13/2017   Sacroiliac joint pain 09/13/2017   Urinary urgency 06/22/2017   Sciatica, right side 06/17/2016   Dysesthesia 10/30/2015   Right carpal tunnel syndrome 10/30/2015   Sciatica of left side 07/16/2015   Chronic ethmoidal sinusitis 06/26/2015   High risk medication use 07/08/2014   Chronic fatigue 04/02/2014   Dysphagia 04/02/2014   Ataxic gait 04/02/2014   Vitamin D deficiency 06/08/2012   OVERACTIVE BLADDER 08/01/2009   Osteoporosis 08/01/2009   Depression with anxiety 07/04/2007   MULTIPLE SCLEROSIS 07/04/2007   OA (osteoarthritis) of  knee 07/04/2007   HEADACHE 07/04/2007   COLONIC POLYPS, HX OF 07/04/2007    PCP: Charlane Ferretti, MD   REFERRING PROVIDER:   Rosemarie Ax, MD    REFERRING DIAG:  [phone] (ICD-10-CM) - Primary osteoarthritis of left knee      THERAPY DIAG:  Pain in joint of left knee  Stiffness of left knee, not elsewhere classified  Muscle weakness (generalized)  Difficulty walking  Rationale for Evaluation and Treatment: Rehabilitation  ONSET DATE: Jan 2024  SUBJECTIVE:   SUBJECTIVE STATEMENT:  Pt did end up seeing the MD yesterday. She did have the genicular nerve block yesterday and will get the nerve ablation at some point down the road. She reports the pain is much much better now and is pleased with the pain reduction.    Eval:  Pt reports that starting in January it started to bother her more. It has been hurting for quite some  time with history of surgery. She has had 2x injections from Dr. Raeford Razor. There is roughly a 25% improvement so far. Even sitting started to bother the knee. Pt states that just walking hurts the knee. Pt states that palpation to the knee and cyst does recreate. Pt states she is fairly sedentary but she does her errands with pain. Her MS effects her functional mobility and endurance. Pt does not enjoy the pool/aquatic therapy. Pt states that stairs bother the knee. Pt states that she can walk about 10-15 mins of walking before needing to sit. Pt feels like the knee buckles weakly.     PERTINENT HISTORY: 2/23 fusion of T7-S1 for scoliosis; L knee surgery medial condyle excision for osteochondritis desiccans; L meniscus tear; MS relapsing remitting  PAIN:  Are you having pain? Yes: NPRS scale: 1/10 Pain location: behind the knee the L knee, and lateral inferior patellar pole  Pain description: sharp, aching Aggravating factors: bending the knee, putting weight through the knee Relieving factors: resting, sitting   PRECAUTIONS: None  WEIGHT BEARING RESTRICTIONS: No  FALLS:  Has patient fallen in last 6 months? No  LIVING ENVIRONMENT: Lives with: lives alone Lives in: House/apartment Stairs: No Has following equipment at home: [facility] and walker, does not use either   OCCUPATION: ICU nurse, retired   PLOF: Independent with basic ADLs  PATIENT GOALS: Pt would like to avoid knee surgery.   NEXT MD VISIT:  This week of 2/19  OBJECTIVE:   DIAGNOSTIC FINDINGS:     IMPRESSION: 1. No acute findings. 2. Moderate tricompartmental osteoarthritic change most prominent over the patellofemoral joint. 3. 2.5 cm soft tissue calcification over the popliteal fossa possibly within a complicated Baker cyst.  PATIENT SURVEYS:  FOTO 44 52 @ D/C 12 pts MCII   TODAY'S TREATMENT:                                                                                                                               DATE:  2/22 L knee TKE grade IV mob  Supine SLR 2x10 Supine figure 4 bridge 2x10 STS from high table 3x8 HR/TR 20x  Gait with AD sizing adjustment- 100'   2/19  Program Notes walking at least 10 mins a dayConsider adding a hiking pole to the R hand  Exercises - Long Sitting Quad Set with Towel Roll Under Heel  - 2 x daily - 7 x weekly - 2 sets - 10 reps - 3 hold - Figure 4 Bridge  - 2 x daily - 7 x weekly - 2 sets - 10 reps - Sitting Knee Extension with Resistance  - 2 x daily - 7 x weekly - 2 sets - 10 reps      PATIENT EDUCATION:  Education details: anatomy, exercise progression, DOMS expectations, muscle firing,  envelope of function, HEP, POC  Person educated: Patient Education method: Explanation, Demonstration, Tactile cues, Verbal cues, and Handouts Education comprehension: verbalized understanding, returned demonstration, verbal cues required, and tactile cues required  HOME EXERCISE PROGRAM:  Access Code: [email or web address] URL: [email or web address] Date: 04/26/2022 Prepared by: Daleen Bo  ASSESSMENT:  CLINICAL IMPRESSION: Pt presents with significantly less pain and irritability of the L knee today following her nerve block. Pt was able to progress quad strengthening exercise and focus on TKE without increase in pain. Pt does have quadriceps muscle endurance deficits as expected with SLR- unable to add weight at this time. HEP was updated today and pt given edu about AD sizing in order to promote efficiency with gait and reduce pain into the L/S. Pt would benefit from continued skilled therapy in order to reach goals and maximize functional L LE strength and ROM for prevention of further functional decline and surgical intervention.    OBJECTIVE IMPAIRMENTS decreased mobility, difficulty walking, decreased ROM, decreased strength, increased muscle spasms, improper body mechanics, postural dysfunction, and pain.    ACTIVITY LIMITATIONS lifting,  sitting, standing, squatting, stairs, transfers, locomotion level   PARTICIPATION LIMITATIONS: cleaning, laundry, driving, shopping, community activity, occupation, yard work, and exercise   PERSONAL FACTORS Age, Fitness, Time since onset of injury/illness/exacerbation, and 2+ comorbidity:    are also affecting patient's functional outcome.    REHAB POTENTIAL: Good   CLINICAL DECISION MAKING: unstable/complicated   EVALUATION COMPLEXITY: Moderate     GOALS:     SHORT TERM GOALS: Target date: 06/07/2022   Pt will become independent with HEP in order to demonstrate synthesis of PT education..   Goal status: INITIAL   2.  Pt will score at least 12 pt increase on FOTO to demonstrate functional improvement in MCII and pt perceived function.      Goal status: INITIAL   3.  Pt will be able to demonstrate/report ability to sit/stand/sleep for extended periods of time without pain in order to demonstrate functional improvement and tolerance to static positioning.    Goal status: INITIAL     LONG TERM GOALS: Target date: 07/19/2022    Pt  will become independent with final HEP in order to demonstrate synthesis of PT education.   Goal status: INITIAL   2.  Pt will score >/= 52 on FOTO to demonstrate improvement in perceived bilat knee function.    Goal status: INITIAL   3.  Pt will be able to demonstrate squat to at least parallel in order to demonstrate functional improvement in LE function for self-care and house hold duties.    Goal status: INITIAL   4.  Pt will be able to demonstrate kneeling to  stand and stand to kneeling transfer without pain in order to demonstrate functional improvement in LE function for ADL/house hold duties.    Goal status: INITIAL       PLAN: PT FREQUENCY: 1-2x/week   PT DURATION: 12 weeks (likely DC in 8)   PLANNED INTERVENTIONS: Therapeutic exercises, Therapeutic activity, Neuromuscular re-education, Balance training, Gait training,  Patient/Family education, Self Care, and Joint mobilization   PLAN FOR NEXT SESSION: continue with L quad and hip strength, knee joint TKE mobs  Daleen Bo, PT 04/29/2022, 8:47 AM

## 2022-05-03 DIAGNOSIS — D649 Anemia, unspecified: Secondary | ICD-10-CM | POA: Diagnosis not present

## 2022-05-06 ENCOUNTER — Encounter (HOSPITAL_BASED_OUTPATIENT_CLINIC_OR_DEPARTMENT_OTHER): Payer: Self-pay | Admitting: Physical Therapy

## 2022-05-06 ENCOUNTER — Ambulatory Visit (HOSPITAL_BASED_OUTPATIENT_CLINIC_OR_DEPARTMENT_OTHER): Payer: Medicare Other | Admitting: Physical Therapy

## 2022-05-06 DIAGNOSIS — M25562 Pain in left knee: Secondary | ICD-10-CM

## 2022-05-06 DIAGNOSIS — M6281 Muscle weakness (generalized): Secondary | ICD-10-CM

## 2022-05-06 DIAGNOSIS — R262 Difficulty in walking, not elsewhere classified: Secondary | ICD-10-CM | POA: Diagnosis not present

## 2022-05-06 DIAGNOSIS — M25662 Stiffness of left knee, not elsewhere classified: Secondary | ICD-10-CM | POA: Diagnosis not present

## 2022-05-06 DIAGNOSIS — M1712 Unilateral primary osteoarthritis, left knee: Secondary | ICD-10-CM | POA: Diagnosis not present

## 2022-05-06 NOTE — Therapy (Signed)
OUTPATIENT PHYSICAL THERAPY LOWER EXTREMITY EVALUATION   Patient Name: Sheri Burns MRN: [id number] DOB:1955-04-16, 67 y.o., female Today's Date: 05/06/2022  END OF SESSION:  PT End of Session - 05/06/22 1014     Visit Number 3    Number of Visits 16    Authorization Type BCBS MCR    PT Start Time 0930    PT Stop Time 1010    PT Time Calculation (min) 40 min    Activity Tolerance Patient tolerated treatment well;Patient limited by pain    Behavior During Therapy [facility] for tasks assessed/performed               Past Medical History:  Diagnosis Date   Anxiety    Bulging lumbar disc 1980   Chronic pansinusitis    Colon polyp    Depression    GERD (gastroesophageal reflux disease)    Gynecological examination    sees Dr. Caren Griffins Romine    Headache(784.0)    Hypertension    Multiple sclerosis ([place]) since 1992   Dr. Arlice Colt in [facility]   Multiple thyroid nodules 1995   per pt. resolved on its own follwed by endo then released   Neurogenic bladder disorder 12/2009   w/ urge & stress inc. dx. Dr. McDiarmid   Neuromuscular disorder [facility])    Multiple sclerosis   Neuropathy    Osteoarthritis    Osteoporosis    last DEXA 2018   Overactive bladder    sees Dr. Nicki Reaper McDiarmid    Pancreatitis 1977   Pneumonia    Ulcerative proctitis ([place])    Vision abnormalities    Past Surgical History:  Procedure Laterality Date   APPENDECTOMY     breast ruptured  left   ductal cyst   CARPAL TUNNEL RELEASE Right    CHOLECYSTECTOMY     COLONOSCOPY  01/10/09   repeat in 5 yrs Dr. Wilford Corner   CYSTOSCOPY WITH BIOPSY N/A 01/06/2022   Procedure: CYSTOSCOPY WITH BLADDER/URETHRAL BIOPSY;  Surgeon: Alexis Frock, MD;  [facility];  Service: Urology;  Laterality: N/A;  [place]     x 3   left knee surgery  1978   to remove bone tumor     NASAL SINUS SURGERY Bilateral 01/13/2015   Procedure: ENDOSCOPIC  SINUS SURGERY;  Surgeon: Izora Gala, MD;  Location: [place];  Service: ENT;  Laterality: Bilateral;   NASAL SINUS SURGERY  05/2017   NASAL TURBINATE REDUCTION     pubo vaginal sling  10/2003   TONSILLECTOMY     vaginal cystocele repair     anterior   VAGINAL HYSTERECTOMY  1995   AUB & Endometriosis   widom teeth removal     Patient Active Problem List   Diagnosis Date Noted   Vertigo 08/05/2021   Chronic prescription opiate use 12/14/2018   Carpal tunnel syndrome, bilateral 09/13/2017   Sacroiliac joint pain 09/13/2017   Urinary urgency 06/22/2017   Sciatica, right side 06/17/2016   Dysesthesia 10/30/2015   Right carpal tunnel syndrome 10/30/2015   Sciatica of left side 07/16/2015   Chronic ethmoidal sinusitis 06/26/2015   High risk medication use 07/08/2014   Chronic fatigue 04/02/2014   Dysphagia 04/02/2014   Ataxic gait 04/02/2014   Vitamin D deficiency 06/08/2012   OVERACTIVE BLADDER 08/01/2009   Osteoporosis 08/01/2009   Depression with anxiety 07/04/2007   MULTIPLE SCLEROSIS 07/04/2007   OA (osteoarthritis)  of knee 07/04/2007   HEADACHE 07/04/2007   COLONIC POLYPS, HX OF 07/04/2007    PCP: Charlane Ferretti, MD   REFERRING PROVIDER:   Rosemarie Ax, MD    REFERRING DIAG:  [phone] (ICD-10-CM) - Primary osteoarthritis of left knee      THERAPY DIAG:  Pain in joint of left knee  Stiffness of left knee, not elsewhere classified  Muscle weakness (generalized)  Difficulty walking  Rationale for Evaluation and Treatment: Rehabilitation  ONSET DATE: Jan 2024  SUBJECTIVE:   SUBJECTIVE STATEMENT:  Pt states the knee pain is similar today now that nerve block has worn. Pt has been compliant with HEP.    Eval:  Pt reports that starting in January it started to bother her more. It has been hurting for quite some time with history of surgery. She has had 2x injections from Dr. Raeford Razor. There is roughly a 25% improvement so far. Even  sitting started to bother the knee. Pt states that just walking hurts the knee. Pt states that palpation to the knee and cyst does recreate. Pt states she is fairly sedentary but she does her errands with pain. Her MS effects her functional mobility and endurance. Pt does not enjoy the pool/aquatic therapy. Pt states that stairs bother the knee. Pt states that she can walk about 10-15 mins of walking before needing to sit. Pt feels like the knee buckles weakly.     PERTINENT HISTORY: 2/23 fusion of T7-S1 for scoliosis; L knee surgery medial condyle excision for osteochondritis desiccans; L meniscus tear; MS relapsing remitting  PAIN:  Are you having pain? Yes: NPRS scale: 5/10 Pain location: behind the knee the L knee, and lateral inferior patellar pole  Pain description: sharp, aching Aggravating factors: bending the knee, putting weight through the knee Relieving factors: resting, sitting   PRECAUTIONS: None  WEIGHT BEARING RESTRICTIONS: No  FALLS:  Has patient fallen in last 6 months? No  LIVING ENVIRONMENT: Lives with: lives alone Lives in: House/apartment Stairs: No Has following equipment at home: [facility] and walker, does not use either   OCCUPATION: ICU nurse, retired   PLOF: Independent with basic ADLs  PATIENT GOALS: Pt would like to avoid knee surgery.   NEXT MD VISIT:  This week of 2/19  OBJECTIVE:   DIAGNOSTIC FINDINGS:     IMPRESSION: 1. No acute findings. 2. Moderate tricompartmental osteoarthritic change most prominent over the patellofemoral joint. 3. 2.5 cm soft tissue calcification over the popliteal fossa possibly within a complicated Baker cyst.  PATIENT SURVEYS:  [id number] @ D/C 12 pts MCII   TODAY'S TREATMENT:                                                                                                                              DATE:   2/29 L knee TKE grade IV mob  Supine SLR 1lb 3x8 Supine figure 4 bridge 2x10 STS from high table  3x8 GTB  at knees  HR/TR 20x GTB at knees sidestepping 75f 2x laps  Shuttle staggered stance 75lbs L low 3x8   2/22 L knee TKE grade IV mob  Supine SLR 2x10 Supine figure 4 bridge 2x10 STS from high table 3x8 HR/TR 20x  Gait with AD sizing adjustment- 100'   2/19  Program Notes walking at least 10 mins a dayConsider adding a hiking pole to the R hand  Exercises - Long Sitting Quad Set with Towel Roll Under Heel  - 2 x daily - 7 x weekly - 2 sets - 10 reps - 3 hold - Figure 4 Bridge  - 2 x daily - 7 x weekly - 2 sets - 10 reps - Sitting Knee Extension with Resistance  - 2 x daily - 7 x weekly - 2 sets - 10 reps      PATIENT EDUCATION:  Education details: anatomy, exercise progression, DOMS expectations, muscle firing,  envelope of function, HEP, POC  Person educated: Patient Education method: Explanation, Demonstration, Tactile cues, Verbal cues, and Handouts Education comprehension: verbalized understanding, returned demonstration, verbal cues required, and tactile cues required  HOME EXERCISE PROGRAM:  Access Code: [email or web address]: [email or web address] Date: 04/26/2022 Prepared by: ADaleen Bo ASSESSMENT:  CLINICAL IMPRESSION: Pt able to demo full SLR without extensor lag today following manual. Able to increase to 1lb weight but with increased fatigue by end of sets and reps. Pt able to progress LE strengthen and CKC exercise without irritation. Plan to continue with progression of L LE strength as tolerated. Consider addition of balance at next. Pt would benefit from continued skilled therapy in order to reach goals and maximize functional L LE strength and ROM for prevention of further functional decline and surgical intervention.    OBJECTIVE IMPAIRMENTS decreased mobility, difficulty walking, decreased ROM, decreased strength, increased muscle spasms, improper body mechanics, postural dysfunction, and pain.    ACTIVITY LIMITATIONS lifting, sitting,  standing, squatting, stairs, transfers, locomotion level   PARTICIPATION LIMITATIONS: cleaning, laundry, driving, shopping, community activity, occupation, yard work, and exercise   PERSONAL FACTORS Age, Fitness, Time since onset of injury/illness/exacerbation, and 2+ comorbidity:    are also affecting patient's functional outcome.    REHAB POTENTIAL: Good   CLINICAL DECISION MAKING: unstable/complicated   EVALUATION COMPLEXITY: Moderate     GOALS:     SHORT TERM GOALS: Target date: 06/07/2022   Pt will become independent with HEP in order to demonstrate synthesis of PT education..   Goal status: INITIAL   2.  Pt will score at least 12 pt increase on FOTO to demonstrate functional improvement in MCII and pt perceived function.      Goal status: INITIAL   3.  Pt will be able to demonstrate/report ability to sit/stand/sleep for extended periods of time without pain in order to demonstrate functional improvement and tolerance to static positioning.    Goal status: INITIAL     LONG TERM GOALS: Target date: 07/19/2022    Pt  will become independent with final HEP in order to demonstrate synthesis of PT education.   Goal status: INITIAL   2.  Pt will score >/= 52 on FOTO to demonstrate improvement in perceived bilat knee function.    Goal status: INITIAL   3.  Pt will be able to demonstrate squat to at least parallel in order to demonstrate functional improvement in LE function for self-care and house hold duties.    Goal status: INITIAL   4.  Pt will be able to  demonstrate kneeling to stand and stand to kneeling transfer without pain in order to demonstrate functional improvement in LE function for ADL/house hold duties.    Goal status: INITIAL       PLAN: PT FREQUENCY: 1-2x/week   PT DURATION: 12 weeks (likely DC in 8)   PLANNED INTERVENTIONS: Therapeutic exercises, Therapeutic activity, Neuromuscular re-education, Balance training, Gait training, Patient/Family  education, Self Care, and Joint mobilization   PLAN FOR NEXT SESSION: continue with L quad and hip strength, knee joint TKE mobs  Daleen Bo, PT 05/06/2022, 10:17 AM

## 2022-05-10 ENCOUNTER — Encounter (HOSPITAL_BASED_OUTPATIENT_CLINIC_OR_DEPARTMENT_OTHER): Payer: Self-pay

## 2022-05-10 ENCOUNTER — Ambulatory Visit (HOSPITAL_BASED_OUTPATIENT_CLINIC_OR_DEPARTMENT_OTHER): Payer: Medicare Other | Attending: Family Medicine

## 2022-05-10 DIAGNOSIS — M6281 Muscle weakness (generalized): Secondary | ICD-10-CM | POA: Insufficient documentation

## 2022-05-10 DIAGNOSIS — M25662 Stiffness of left knee, not elsewhere classified: Secondary | ICD-10-CM | POA: Diagnosis not present

## 2022-05-10 DIAGNOSIS — M25562 Pain in left knee: Secondary | ICD-10-CM | POA: Diagnosis not present

## 2022-05-10 DIAGNOSIS — R262 Difficulty in walking, not elsewhere classified: Secondary | ICD-10-CM | POA: Insufficient documentation

## 2022-05-10 NOTE — Therapy (Signed)
OUTPATIENT PHYSICAL THERAPY LOWER EXTREMITY EVALUATION   Patient Name: Sheri Burns MRN: [id number] DOB:1955-10-18, 67 y.o., female Today's Date: 05/10/2022  END OF SESSION:  PT End of Session - 05/10/22 1432     Visit Number 4    Number of Visits 16    Authorization Type BCBS MCR    PT Start Time [id number]    PT Stop Time 1515    PT Time Calculation (min) 42 min    Activity Tolerance Patient tolerated treatment well    Behavior During Therapy WFL for tasks assessed/performed               Past Medical History:  Diagnosis Date   Anxiety    Bulging lumbar disc 1980   Chronic pansinusitis    Colon polyp    Depression    GERD (gastroesophageal reflux disease)    Gynecological examination    sees Dr. Caren Griffins Romine    Headache(784.0)    Hypertension    Multiple sclerosis ([place]) since 1992   Dr. Arlice Colt in [facility]   Multiple thyroid nodules 1995   per pt. resolved on its own follwed by endo then released   Neurogenic bladder disorder 12/2009   w/ urge [facility] stress [facility]. dx. Dr. McDiarmid   Neuromuscular disorder [facility])    Multiple sclerosis   Neuropathy    Osteoarthritis    Osteoporosis    last DEXA 2018   Overactive bladder    sees Dr. Nicki Reaper McDiarmid    Pancreatitis 1977   Pneumonia    Ulcerative proctitis ([place])    Vision abnormalities    Past Surgical History:  Procedure Laterality Date   APPENDECTOMY     breast ruptured  left   ductal cyst   CARPAL TUNNEL RELEASE Right    CHOLECYSTECTOMY     COLONOSCOPY  01/10/09   repeat in 5 yrs Dr. Wilford Corner   CYSTOSCOPY WITH BIOPSY N/A 01/06/2022   Procedure: CYSTOSCOPY WITH BLADDER/URETHRAL BIOPSY;  Surgeon: Alexis Frock, MD;  Location: [facility];  Service: Urology;  Laterality: N/A;  [place]     x 3   left knee surgery  1978   to remove bone tumor     NASAL SINUS SURGERY Bilateral 01/13/2015   Procedure: ENDOSCOPIC SINUS SURGERY;  Surgeon:  Izora Gala, MD;  Location: [facility];  Service: ENT;  Laterality: Bilateral;   NASAL SINUS SURGERY  05/2017   NASAL TURBINATE REDUCTION     pubo vaginal sling  10/2003   TONSILLECTOMY     vaginal cystocele repair     anterior   VAGINAL HYSTERECTOMY  1995   AUB & Endometriosis   widom teeth removal     Patient Active Problem List   Diagnosis Date Noted   Vertigo 08/05/2021   Chronic prescription opiate use 12/14/2018   Carpal tunnel syndrome, bilateral 09/13/2017   Sacroiliac joint pain 09/13/2017   Urinary urgency 06/22/2017   Sciatica, right side 06/17/2016   Dysesthesia 10/30/2015   Right carpal tunnel syndrome 10/30/2015   Sciatica of left side 07/16/2015   Chronic ethmoidal sinusitis 06/26/2015   High risk medication use 07/08/2014   Chronic fatigue 04/02/2014   Dysphagia 04/02/2014   Ataxic gait 04/02/2014   Vitamin D deficiency 06/08/2012   OVERACTIVE BLADDER 08/01/2009   Osteoporosis 08/01/2009   Depression with anxiety 07/04/2007   MULTIPLE SCLEROSIS 07/04/2007   OA (osteoarthritis) of knee 07/04/2007  HEADACHE 07/04/2007   COLONIC POLYPS, HX OF 07/04/2007    PCP: Charlane Ferretti, MD   REFERRING PROVIDER:   Rosemarie Ax, MD    REFERRING DIAG:  [phone] (ICD-10-CM) - Primary osteoarthritis of left knee      THERAPY DIAG:  Pain in joint of left knee  Stiffness of left knee, not elsewhere classified  Muscle weakness (generalized)  Difficulty walking  Rationale for Evaluation and Treatment: Rehabilitation  ONSET DATE: Jan 2024  SUBJECTIVE:   SUBJECTIVE STATEMENT:  Pt reports no improvement or worsening in L knee. Did have to walk up/down steps at [facility] Saturday for a concert which caused increased 8/10 pain level.    Eval:  Pt reports that starting in January it started to bother her more. It has been hurting for quite some time with history of surgery. She has had 2x injections from Dr. Raeford Razor. There is roughly a 25%  improvement so far. Even sitting started to bother the knee. Pt states that just walking hurts the knee. Pt states that palpation to the knee and cyst does recreate. Pt states she is fairly sedentary but she does her errands with pain. Her MS effects her functional mobility and endurance. Pt does not enjoy the pool/aquatic therapy. Pt states that stairs bother the knee. Pt states that she can walk about 10-15 mins of walking before needing to sit. Pt feels like the knee buckles weakly.     PERTINENT HISTORY: 2/23 fusion of T7-S1 for scoliosis; L knee surgery medial condyle excision for osteochondritis desiccans; L meniscus tear; MS relapsing remitting  PAIN:  Are you having pain? Yes: NPRS scale: 5/10 Pain location: behind the knee the L knee, and lateral inferior patellar pole  Pain description: sharp, aching Aggravating factors: bending the knee, putting weight through the knee Relieving factors: resting, sitting   PRECAUTIONS: None  WEIGHT BEARING RESTRICTIONS: No  FALLS:  Has patient fallen in last 6 months? No  LIVING ENVIRONMENT: Lives with: lives alone Lives in: House/apartment Stairs: No Has following equipment at home: [facility] and walker, does not use either   OCCUPATION: ICU nurse, retired   PLOF: Independent with basic ADLs  PATIENT GOALS: Pt would like to avoid knee surgery.   NEXT MD VISIT:  This week of 2/19  OBJECTIVE:   DIAGNOSTIC FINDINGS:     IMPRESSION: 1. No acute findings. 2. Moderate tricompartmental osteoarthritic change most prominent over the patellofemoral joint. 3. 2.5 cm soft tissue calcification over the popliteal fossa possibly within a complicated Baker cyst.  PATIENT SURVEYS:  FOTO 44 52 @ D/C 12 pts MCII   TODAY'S TREATMENT:                                                                                                                              DATE:   3/4 L knee Tib/fem grade III mobs ant/posterior glide Patella femoral  mobilizations L knee PROM  Supine SLR 1lb 3x8 Supine  figure 4 bridge-attempted, but bothered back so stopped. Sidelying hip abduction 2x10L Long sitting  HSS 30secx3 ea STS from high table 3x8 GTB at knees  HR/TR 3x10 GTB at knees sidestepping 69f 3x laps     2/22 L knee TKE grade IV mob  Supine SLR 2x10 Supine figure 4 bridge 2x10 STS from high table 3x8 HR/TR 20x  Gait with AD sizing adjustment- 100'   2/19  Program Notes walking at least 10 mins a dayConsider adding a hiking pole to the R hand  Exercises - Long Sitting Quad Set with Towel Roll Under Heel  - 2 x daily - 7 x weekly - 2 sets - 10 reps - 3 hold - Figure 4 Bridge  - 2 x daily - 7 x weekly - 2 sets - 10 reps - Sitting Knee Extension with Resistance  - 2 x daily - 7 x weekly - 2 sets - 10 reps      PATIENT EDUCATION:  Education details: anatomy, exercise progression, DOMS expectations, muscle firing,  envelope of function, HEP, POC  Person educated: Patient Education method: Explanation, Demonstration, Tactile cues, Verbal cues, and Handouts Education comprehension: verbalized understanding, returned demonstration, verbal cues required, and tactile cues required  HOME EXERCISE PROGRAM:  Access Code: [email or web address]: [email or web address] Date: 04/26/2022 Prepared by: ADaleen Bo ASSESSMENT:  CLINICAL IMPRESSION: Pt remains tight and tender to palpation of popliteal area nd distal HS.  Very tight into HS bilaterally as noted with long sitting HS stretch. Weakness observed in glute med with s/l hip abduction. Updated HEP to include these exs for pt to perform at home. She did have some pain in back with bridging, so discontinued this. Will montior for back pain as we progress with strengthening.   OBJECTIVE IMPAIRMENTS decreased mobility, difficulty walking, decreased ROM, decreased strength, increased muscle spasms, improper body mechanics, postural dysfunction, and pain.    ACTIVITY  LIMITATIONS lifting, sitting, standing, squatting, stairs, transfers, locomotion level   PARTICIPATION LIMITATIONS: cleaning, laundry, driving, shopping, community activity, occupation, yard work, and exercise   PERSONAL FACTORS Age, Fitness, Time since onset of injury/illness/exacerbation, and 2+ comorbidity:    are also affecting patient's functional outcome.    REHAB POTENTIAL: Good   CLINICAL DECISION MAKING: unstable/complicated   EVALUATION COMPLEXITY: Moderate     GOALS:     SHORT TERM GOALS: Target date: 06/07/2022   Pt will become independent with HEP in order to demonstrate synthesis of PT education..   Goal status: INITIAL   2.  Pt will score at least 12 pt increase on FOTO to demonstrate functional improvement in MCII and pt perceived function.      Goal status: INITIAL   3.  Pt will be able to demonstrate/report ability to sit/stand/sleep for extended periods of time without pain in order to demonstrate functional improvement and tolerance to static positioning.    Goal status: INITIAL     LONG TERM GOALS: Target date: 07/19/2022    Pt  will become independent with final HEP in order to demonstrate synthesis of PT education.   Goal status: INITIAL   2.  Pt will score >/= 52 on FOTO to demonstrate improvement in perceived bilat knee function.    Goal status: INITIAL   3.  Pt will be able to demonstrate squat to at least parallel in order to demonstrate functional improvement in LE function for self-care and house hold duties.    Goal status: INITIAL   4.  Pt will be able  to demonstrate kneeling to stand and stand to kneeling transfer without pain in order to demonstrate functional improvement in LE function for ADL/house hold duties.    Goal status: INITIAL       PLAN: PT FREQUENCY: 1-2x/week   PT DURATION: 12 weeks (likely DC in 8)   PLANNED INTERVENTIONS: Therapeutic exercises, Therapeutic activity, Neuromuscular re-education, Balance training,  Gait training, Patient/Family education, Self Care, and Joint mobilization   PLAN FOR NEXT SESSION: continue with L quad and hip strength, knee joint TKE mobs  Graceann Congress Rudean Icenhour, PTA 05/10/2022, 5:10 PM

## 2022-05-11 ENCOUNTER — Other Ambulatory Visit: Payer: Self-pay | Admitting: Neurology

## 2022-05-11 DIAGNOSIS — G35 Multiple sclerosis: Secondary | ICD-10-CM

## 2022-05-11 NOTE — Telephone Encounter (Signed)
Pt last seen on 08/05/2021 Follow up visit scheduled on 08/18/22

## 2022-05-12 ENCOUNTER — Encounter (HOSPITAL_BASED_OUTPATIENT_CLINIC_OR_DEPARTMENT_OTHER): Payer: Self-pay

## 2022-05-12 ENCOUNTER — Ambulatory Visit (HOSPITAL_BASED_OUTPATIENT_CLINIC_OR_DEPARTMENT_OTHER): Payer: Medicare Other

## 2022-05-12 ENCOUNTER — Other Ambulatory Visit: Payer: Self-pay | Admitting: Neurology

## 2022-05-12 DIAGNOSIS — M6281 Muscle weakness (generalized): Secondary | ICD-10-CM

## 2022-05-12 DIAGNOSIS — M25662 Stiffness of left knee, not elsewhere classified: Secondary | ICD-10-CM | POA: Diagnosis not present

## 2022-05-12 DIAGNOSIS — R262 Difficulty in walking, not elsewhere classified: Secondary | ICD-10-CM

## 2022-05-12 DIAGNOSIS — M25562 Pain in left knee: Secondary | ICD-10-CM | POA: Diagnosis not present

## 2022-05-12 NOTE — Therapy (Signed)
OUTPATIENT PHYSICAL THERAPY LOWER EXTREMITY EVALUATION   Patient Name: Sheri Burns MRN: [id number] DOB:06-03-1955, 67 y.o., female Today's Date: 05/12/2022  END OF SESSION:  PT End of Session - 05/12/22 0938     Visit Number 5    Number of Visits 16    Authorization Type BCBS MCR    PT Start Time [id number]    PT Stop Time 1015    PT Time Calculation (min) 40 min    Activity Tolerance Patient tolerated treatment well    Behavior During Therapy WFL for tasks assessed/performed               Past Medical History:  Diagnosis Date   Anxiety    Bulging lumbar disc 1980   Chronic pansinusitis    Colon polyp    Depression    GERD (gastroesophageal reflux disease)    Gynecological examination    sees Dr. Caren Griffins Romine    Headache(784.0)    Hypertension    Multiple sclerosis ([place]) since 1992   Dr. Arlice Colt in [facility]   Multiple thyroid nodules 1995   per pt. resolved on its own follwed by endo then released   Neurogenic bladder disorder 12/2009   w/ urge & stress inc. dx. Dr. McDiarmid   Neuromuscular disorder [facility])    Multiple sclerosis   Neuropathy    Osteoarthritis    Osteoporosis    last DEXA 2018   Overactive bladder    sees Dr. Nicki Reaper McDiarmid    Pancreatitis 1977   Pneumonia    Ulcerative proctitis ([place])    Vision abnormalities    Past Surgical History:  Procedure Laterality Date   APPENDECTOMY     breast ruptured  left   ductal cyst   CARPAL TUNNEL RELEASE Right    CHOLECYSTECTOMY     COLONOSCOPY  01/10/09   repeat in 5 yrs Dr. Wilford Corner   CYSTOSCOPY WITH BIOPSY N/A 01/06/2022   Procedure: CYSTOSCOPY WITH BLADDER/URETHRAL BIOPSY;  Surgeon: Alexis Frock, MD;  Location: [facility];  Service: Urology;  Laterality: N/A;  [place]     x 3   left knee surgery  1978   to remove bone tumor     NASAL SINUS SURGERY Bilateral 01/13/2015   Procedure: ENDOSCOPIC SINUS SURGERY;  Surgeon:  Izora Gala, MD;  Location: [place];  Service: ENT;  Laterality: Bilateral;   NASAL SINUS SURGERY  05/2017   NASAL TURBINATE REDUCTION     pubo vaginal sling  10/2003   TONSILLECTOMY     vaginal cystocele repair     anterior   VAGINAL HYSTERECTOMY  1995   AUB & Endometriosis   widom teeth removal     Patient Active Problem List   Diagnosis Date Noted   Vertigo 08/05/2021   Chronic prescription opiate use 12/14/2018   Carpal tunnel syndrome, bilateral 09/13/2017   Sacroiliac joint pain 09/13/2017   Urinary urgency 06/22/2017   Sciatica, right side 06/17/2016   Dysesthesia 10/30/2015   Right carpal tunnel syndrome 10/30/2015   Sciatica of left side 07/16/2015   Chronic ethmoidal sinusitis 06/26/2015   High risk medication use 07/08/2014   Chronic fatigue 04/02/2014   Dysphagia 04/02/2014   Ataxic gait 04/02/2014   Vitamin D deficiency 06/08/2012   OVERACTIVE BLADDER 08/01/2009   Osteoporosis 08/01/2009   Depression with anxiety 07/04/2007   MULTIPLE SCLEROSIS 07/04/2007   OA (osteoarthritis) of knee 07/04/2007  HEADACHE 07/04/2007   COLONIC POLYPS, HX OF 07/04/2007    PCP: Charlane Ferretti, MD   REFERRING PROVIDER:   Rosemarie Ax, MD    REFERRING DIAG:  [phone] (ICD-10-CM) - Primary osteoarthritis of left knee      THERAPY DIAG:  Pain in joint of left knee  Stiffness of left knee, not elsewhere classified  Muscle weakness (generalized)  Difficulty walking  Rationale for Evaluation and Treatment: Rehabilitation  ONSET DATE: Jan 2024  SUBJECTIVE:   SUBJECTIVE STATEMENT:  Pt reports no changes in L knee as of yet. Exercises are going well. No incidence of back pain.   Eval:  Pt reports that starting in January it started to bother her more. It has been hurting for quite some time with history of surgery. She has had 2x injections from Dr. Raeford Razor. There is roughly a 25% improvement so far. Even sitting started to bother the knee. Pt  states that just walking hurts the knee. Pt states that palpation to the knee and cyst does recreate. Pt states she is fairly sedentary but she does her errands with pain. Her MS effects her functional mobility and endurance. Pt does not enjoy the pool/aquatic therapy. Pt states that stairs bother the knee. Pt states that she can walk about 10-15 mins of walking before needing to sit. Pt feels like the knee buckles weakly.     PERTINENT HISTORY: 2/23 fusion of T7-S1 for scoliosis; L knee surgery medial condyle excision for osteochondritis desiccans; L meniscus tear; MS relapsing remitting  PAIN:  Are you having pain? Yes: NPRS scale: 5/10 Pain location: behind the knee the L knee, and lateral inferior patellar pole  Pain description: sharp, aching Aggravating factors: bending the knee, putting weight through the knee Relieving factors: resting, sitting   PRECAUTIONS: None  WEIGHT BEARING RESTRICTIONS: No  FALLS:  Has patient fallen in last 6 months? No  LIVING ENVIRONMENT: Lives with: lives alone Lives in: House/apartment Stairs: No Has following equipment at home: [facility] and walker, does not use either   OCCUPATION: ICU nurse, retired   PLOF: Independent with basic ADLs  PATIENT GOALS: Pt would like to avoid knee surgery.   NEXT MD VISIT:  This week of 2/19  OBJECTIVE:   DIAGNOSTIC FINDINGS:     IMPRESSION: 1. No acute findings. 2. Moderate tricompartmental osteoarthritic change most prominent over the patellofemoral joint. 3. 2.5 cm soft tissue calcification over the popliteal fossa possibly within a complicated Baker cyst.  PATIENT SURVEYS:  FOTO 44 52 @ D/C 12 pts MCII   TODAY'S TREATMENT:                                                                                                                              DATE:  3/6 L knee Tib/fem grade III mobs ant/posterior glide L knee PROM  Supine SLR 1lb 3x10 Hold on bridges Sidelying hip abduction 3x10L LAQ  2# 2x10 5 sec hold Long  sitting  HSS 30secx3 ea STS from high table 3x8 GTB at knees  HR/TR 3x10 GTB at knees sidestepping 101f 3x laps  Standing hip abduction 1# 2x10 bil   3/4 L knee Tib/fem grade III mobs ant/posterior glide Patella femoral mobilizations L knee PROM  Supine SLR 1lb 3x8 Supine figure 4 bridge-attempted, but bothered back so stopped. Sidelying hip abduction 2x10L Long sitting  HSS 30secx3 ea STS from high table 3x8 GTB at knees  HR/TR 3x10 GTB at knees sidestepping 32f3x laps     2/22 L knee TKE grade IV mob  Supine SLR 2x10 Supine figure 4 bridge 2x10 STS from high table 3x8 HR/TR 20x  Gait with AD sizing adjustment- 100'   PATIENT EDUCATION:  Education details: anatomy, exercise progression, DOMS expectations, muscle firing,  envelope of function, HEP, POC  Person educated: Patient Education method: Explanation, Demonstration, Tactile cues, Verbal cues, and Handouts Education comprehension: verbalized understanding, returned demonstration, verbal cues required, and tactile cues required  HOME EXERCISE PROGRAM:  Access Code: [email or web address]: [email or web address] Date: 04/26/2022 Prepared by: AlDaleen BoASSESSMENT:  CLINICAL IMPRESSION: Remains challenged in HS length with long sit HSS. Lacks both end range knee flexion and extension, which slightly improved following PROM and joint mobilizations. Able to tolerate increased repetition for strengthening exercises today with no c/o pain, only fatigue. Noted fatigue in quads with LAQ and with sidelying hip abduction. Pt will benefit from continued LE strengthening.   OBJECTIVE IMPAIRMENTS decreased mobility, difficulty walking, decreased ROM, decreased strength, increased muscle spasms, improper body mechanics, postural dysfunction, and pain.    ACTIVITY LIMITATIONS lifting, sitting, standing, squatting, stairs, transfers, locomotion level   PARTICIPATION LIMITATIONS: cleaning,  laundry, driving, shopping, community activity, occupation, yard work, and exercise   PERSONAL FACTORS Age, Fitness, Time since onset of injury/illness/exacerbation, and 2+ comorbidity:    are also affecting patient's functional outcome.    REHAB POTENTIAL: Good   CLINICAL DECISION MAKING: unstable/complicated   EVALUATION COMPLEXITY: Moderate     GOALS:     SHORT TERM GOALS: Target date: 06/07/2022   Pt will become independent with HEP in order to demonstrate synthesis of PT education..   Goal status: INITIAL   2.  Pt will score at least 12 pt increase on FOTO to demonstrate functional improvement in MCII and pt perceived function.      Goal status: INITIAL   3.  Pt will be able to demonstrate/report ability to sit/stand/sleep for extended periods of time without pain in order to demonstrate functional improvement and tolerance to static positioning.    Goal status: INITIAL     LONG TERM GOALS: Target date: 07/19/2022    Pt  will become independent with final HEP in order to demonstrate synthesis of PT education.   Goal status: INITIAL   2.  Pt will score >/= 52 on FOTO to demonstrate improvement in perceived bilat knee function.    Goal status: INITIAL   3.  Pt will be able to demonstrate squat to at least parallel in order to demonstrate functional improvement in LE function for self-care and house hold duties.    Goal status: INITIAL   4.  Pt will be able to demonstrate kneeling to stand and stand to kneeling transfer without pain in order to demonstrate functional improvement in LE function for ADL/house hold duties.    Goal status: INITIAL       PLAN: PT FREQUENCY: 1-2x/week   PT DURATION: 12 weeks (likely DC in 8)  PLANNED INTERVENTIONS: Therapeutic exercises, Therapeutic activity, Neuromuscular re-education, Balance training, Gait training, Patient/Family education, Self Care, and Joint mobilization   PLAN FOR NEXT SESSION: continue with L quad and hip  strength, knee joint TKE mobs  Graceann Congress Charie Pinkus, PTA 05/12/2022, 11:29 AM

## 2022-05-13 MED ORDER — CLONAZEPAM 1 MG PO TABS: 1.0000 mg | ORAL_TABLET | Freq: Three times a day (TID) | ORAL | 0 refills | Status: DC | PRN

## 2022-05-18 ENCOUNTER — Other Ambulatory Visit: Payer: Self-pay | Admitting: Family Medicine

## 2022-05-18 MED ORDER — AMPHETAMINE-DEXTROAMPHETAMINE 20 MG PO TABS: 20.0000 mg | ORAL_TABLET | Freq: Two times a day (BID) | ORAL | 0 refills | Status: DC

## 2022-05-19 ENCOUNTER — Encounter (HOSPITAL_BASED_OUTPATIENT_CLINIC_OR_DEPARTMENT_OTHER): Payer: Self-pay

## 2022-05-19 ENCOUNTER — Ambulatory Visit (HOSPITAL_BASED_OUTPATIENT_CLINIC_OR_DEPARTMENT_OTHER): Payer: Medicare Other

## 2022-05-19 DIAGNOSIS — R262 Difficulty in walking, not elsewhere classified: Secondary | ICD-10-CM | POA: Diagnosis not present

## 2022-05-19 DIAGNOSIS — M25562 Pain in left knee: Secondary | ICD-10-CM | POA: Diagnosis not present

## 2022-05-19 DIAGNOSIS — M6281 Muscle weakness (generalized): Secondary | ICD-10-CM | POA: Diagnosis not present

## 2022-05-19 DIAGNOSIS — M25662 Stiffness of left knee, not elsewhere classified: Secondary | ICD-10-CM

## 2022-05-19 NOTE — Therapy (Signed)
OUTPATIENT PHYSICAL THERAPY LOWER EXTREMITY EVALUATION   Patient Name: Sheri Burns MRN: [id number] DOB:[birth year], 67 y.o., female Today's Date: 05/19/2022  END OF SESSION:      Past Medical History:  Diagnosis Date   Anxiety    Bulging lumbar disc 1980   Chronic pansinusitis    Colon polyp    Depression    GERD (gastroesophageal reflux disease)    Gynecological examination    sees Dr. Caren Griffins [place]    Headache(784.0)    Hypertension    Multiple sclerosis ([place]) since 1992   Dr. Arlice Colt in [facility]   Multiple thyroid nodules 1995   per pt. resolved on its own follwed by endo then released   Neurogenic bladder disorder 12/2009   w/ urge & stress [facility]. dx. Dr. McDiarmid   Neuromuscular disorder [facility])    Multiple sclerosis   Neuropathy    Osteoarthritis    Osteoporosis    last DEXA 2018   Overactive bladder    sees Dr. Nicki Reaper McDiarmid    Pancreatitis 1977   Pneumonia    Ulcerative proctitis ([place])    Vision abnormalities    Past Surgical History:  Procedure Laterality Date   APPENDECTOMY     breast ruptured  left   ductal cyst   CARPAL TUNNEL RELEASE Right    CHOLECYSTECTOMY     COLONOSCOPY  01/10/09   repeat in 5 yrs Dr. Wilford Corner   CYSTOSCOPY WITH BIOPSY N/A 01/06/2022   Procedure: CYSTOSCOPY WITH BLADDER/URETHRAL BIOPSY;  Surgeon: Alexis Frock, MD;  Location: [facility];  Service: Urology;  Laterality: N/A;  [place]     x 3   left knee surgery  1978   to remove bone tumor     NASAL SINUS SURGERY Bilateral 01/13/2015   Procedure: ENDOSCOPIC SINUS SURGERY;  Surgeon: Izora Gala, MD;  Location: [place];  Service: ENT;  Laterality: Bilateral;   NASAL SINUS SURGERY  05/2017   NASAL TURBINATE REDUCTION     pubo vaginal sling  10/2003   TONSILLECTOMY     vaginal cystocele repair     anterior   VAGINAL HYSTERECTOMY  1995   AUB & Endometriosis   widom teeth removal      Patient Active Problem List   Diagnosis Date Noted   Vertigo 08/05/2021   Chronic prescription opiate use 12/14/2018   Carpal tunnel syndrome, bilateral 09/13/2017   Sacroiliac joint pain 09/13/2017   Urinary urgency 06/22/2017   Sciatica, right side 06/17/2016   Dysesthesia 10/30/2015   Right carpal tunnel syndrome 10/30/2015   Sciatica of left side 07/16/2015   Chronic ethmoidal sinusitis 06/26/2015   High risk medication use 07/08/2014   Chronic fatigue 04/02/2014   Dysphagia 04/02/2014   Ataxic gait 04/02/2014   Vitamin D deficiency 06/08/2012   OVERACTIVE BLADDER 08/01/2009   Osteoporosis 08/01/2009   Depression with anxiety 07/04/2007   MULTIPLE SCLEROSIS 07/04/2007   OA (osteoarthritis) of knee 07/04/2007   HEADACHE 07/04/2007   COLONIC POLYPS, HX OF 07/04/2007    PCP: Charlane Ferretti, MD   REFERRING PROVIDER:   Rosemarie Ax, MD    REFERRING DIAG:  [phone] (ICD-10-CM) - Primary osteoarthritis of left knee      THERAPY DIAG:  No diagnosis found.  Rationale for Evaluation and Treatment: Rehabilitation  ONSET DATE: Jan 2024  SUBJECTIVE:   SUBJECTIVE STATEMENT:  Pt reports no changes in L knee  pain, continues to be 5/10. She does note improvement in strength and ROM .  Eval:  Pt reports that starting in January it started to bother her more. It has been hurting for quite some time with history of surgery. She has had 2x injections from Dr. Raeford Razor. There is roughly a 25% improvement so far. Even sitting started to bother the knee. Pt states that just walking hurts the knee. Pt states that palpation to the knee and cyst does recreate. Pt states she is fairly sedentary but she does her errands with pain. Her MS effects her functional mobility and endurance. Pt does not enjoy the pool/aquatic therapy. Pt states that stairs bother the knee. Pt states that she can walk about 10-15 mins of walking before needing to sit. Pt feels like the knee buckles weakly.      PERTINENT HISTORY: 2/23 fusion of T7-S1 for scoliosis; L knee surgery medial condyle excision for osteochondritis desiccans; L meniscus tear; MS relapsing remitting  PAIN:  Are you having pain? Yes: NPRS scale: 5/10 Pain location: behind the knee the L knee, and lateral inferior patellar pole  Pain description: sharp, aching Aggravating factors: bending the knee, putting weight through the knee Relieving factors: resting, sitting   PRECAUTIONS: None  WEIGHT BEARING RESTRICTIONS: No  FALLS:  Has patient fallen in last 6 months? No  LIVING ENVIRONMENT: Lives with: lives alone Lives in: House/apartment Stairs: No Has following equipment at home: [facility] and walker, does not use either   OCCUPATION: ICU nurse, retired   PLOF: Independent with basic ADLs  PATIENT GOALS: Pt would like to avoid knee surgery.   NEXT MD VISIT:  This week of 2/19  OBJECTIVE:   DIAGNOSTIC FINDINGS:     IMPRESSION: 1. No acute findings. 2. Moderate tricompartmental osteoarthritic change most prominent over the patellofemoral joint. 3. 2.5 cm soft tissue calcification over the popliteal fossa possibly within a complicated Baker cyst.  PATIENT SURVEYS:  FOTO 44 52 @ D/C 12 pts MCII   TODAY'S TREATMENT:                                                                                                                              DATE:  3/13 L knee Tib/fem grade III mobs ant/posterior glide L knee PROM  Supine SLR 1lb 3x10L Hold on bridges Sidelying hip abduction 3x10L LAQ 3# 2x10ea Long sitting  HSS 30secx3 ea STS from high table 3x8 GTB at knees  HR/TR 3x10 GTB at knees sidestepping 22f 3x laps  Standing hip abduction 1# 3x10 bil  3/6 L knee Tib/fem grade III mobs ant/posterior glide L knee PROM  Supine SLR 1lb 3x10 Hold on bridges Sidelying hip abduction 3x10L LAQ 2# 2x10 5 sec hold Long sitting  HSS 30secx3 ea STS from high table 3x8 GTB at knees  HR/TR 3x10 GTB at  knees sidestepping 337f3x laps  Standing hip abduction 1# 2x10 bil   3/4 L knee Tib/fem  grade III mobs ant/posterior glide Patella femoral mobilizations L knee PROM  Supine SLR 1lb 3x8 Supine figure 4 bridge-attempted, but bothered back so stopped. Sidelying hip abduction 2x10L Long sitting  HSS 30secx3 ea STS from high table 3x8 GTB at knees  HR/TR 3x10 GTB at knees sidestepping 41f 3x laps     2/22 L knee TKE grade IV mob  Supine SLR 2x10 Supine figure 4 bridge 2x10 STS from high table 3x8 HR/TR 20x  Gait with AD sizing adjustment- 100'   PATIENT EDUCATION:  Education details: anatomy, exercise progression, DOMS expectations, muscle firing,  envelope of function, HEP, POC  Person educated: Patient Education method: Explanation, Demonstration, Tactile cues, Verbal cues, and Handouts Education comprehension: verbalized understanding, returned demonstration, verbal cues required, and tactile cues required  HOME EXERCISE PROGRAM:  Access Code: [email or web address]: [email or web address] Date: 04/26/2022 Prepared by: ADaleen Bo ASSESSMENT:  CLINICAL IMPRESSION: Remains very restricted with HS length so continued to address this in clinic. She was able to progress with strengthening, completing increase resistance with LAQ and increased repetitions with standing hip abduction with light resistance. She denied pain with activity, though she does report muscular fatigue. Has been more aware of posture with exercises. FOTO next visit.  OBJECTIVE IMPAIRMENTS decreased mobility, difficulty walking, decreased ROM, decreased strength, increased muscle spasms, improper body mechanics, postural dysfunction, and pain.    ACTIVITY LIMITATIONS lifting, sitting, standing, squatting, stairs, transfers, locomotion level   PARTICIPATION LIMITATIONS: cleaning, laundry, driving, shopping, community activity, occupation, yard work, and exercise   PERSONAL FACTORS Age, Fitness,  Time since onset of injury/illness/exacerbation, and 2+ comorbidity:    are also affecting patient's functional outcome.    REHAB POTENTIAL: Good   CLINICAL DECISION MAKING: unstable/complicated   EVALUATION COMPLEXITY: Moderate     GOALS:     SHORT TERM GOALS: Target date: 06/07/2022   Pt will become independent with HEP in order to demonstrate synthesis of PT education..   Goal status: MET 3/13   2.  Pt will score at least 12 pt increase on FOTO to demonstrate functional improvement in MCII and pt perceived function.      Goal status: INITIAL   3.  Pt will be able to demonstrate/report ability to sit/stand/sleep for extended periods of time without pain in order to demonstrate functional improvement and tolerance to static positioning.    Goal status: INITIAL     LONG TERM GOALS: Target date: 07/19/2022    Pt  will become independent with final HEP in order to demonstrate synthesis of PT education.   Goal status: INITIAL   2.  Pt will score >/= 52 on FOTO to demonstrate improvement in perceived bilat knee function.    Goal status: INITIAL   3.  Pt will be able to demonstrate squat to at least parallel in order to demonstrate functional improvement in LE function for self-care and house hold duties.    Goal status: INITIAL   4.  Pt will be able to demonstrate kneeling to stand and stand to kneeling transfer without pain in order to demonstrate functional improvement in LE function for ADL/house hold duties.    Goal status: INITIAL       PLAN: PT FREQUENCY: 1-2x/week   PT DURATION: 12 weeks (likely DC in 8)   PLANNED INTERVENTIONS: Therapeutic exercises, Therapeutic activity, Neuromuscular re-education, Balance training, Gait training, Patient/Family education, Self Care, and Joint mobilization   PLAN FOR NEXT SESSION: continue with L quad and hip strength, knee joint TKE mobs  Graceann Congress Chalonda Schlatter, PTA 05/19/2022, 11:05 AM

## 2022-05-21 ENCOUNTER — Encounter (HOSPITAL_BASED_OUTPATIENT_CLINIC_OR_DEPARTMENT_OTHER): Payer: Self-pay

## 2022-05-21 ENCOUNTER — Ambulatory Visit (HOSPITAL_BASED_OUTPATIENT_CLINIC_OR_DEPARTMENT_OTHER): Payer: Medicare Other

## 2022-05-21 DIAGNOSIS — M25562 Pain in left knee: Secondary | ICD-10-CM | POA: Diagnosis not present

## 2022-05-21 DIAGNOSIS — M25662 Stiffness of left knee, not elsewhere classified: Secondary | ICD-10-CM

## 2022-05-21 DIAGNOSIS — M6281 Muscle weakness (generalized): Secondary | ICD-10-CM | POA: Diagnosis not present

## 2022-05-21 DIAGNOSIS — R262 Difficulty in walking, not elsewhere classified: Secondary | ICD-10-CM | POA: Diagnosis not present

## 2022-05-21 NOTE — Therapy (Signed)
OUTPATIENT PHYSICAL THERAPY LOWER EXTREMITY EVALUATION   Patient Name: Sheri Burns MRN: [id number] DOB:02-May-1955, 67 y.o., female Today's Date: 05/21/2022  END OF SESSION:  PT End of Session - 05/21/22 1020     Visit Number 7    Number of Visits 16    Authorization Type BCBS MCR    PT Start Time 1017    PT Stop Time 1055    PT Time Calculation (min) 38 min    Activity Tolerance Patient tolerated treatment well    Behavior During Therapy WFL for tasks assessed/performed                Past Medical History:  Diagnosis Date   Anxiety    Bulging lumbar disc 1980   Chronic pansinusitis    Colon polyp    Depression    GERD (gastroesophageal reflux disease)    Gynecological examination    sees Dr. Caren Griffins [place]    Headache(784.0)    Hypertension    Multiple sclerosis ([place]) since 1992   Dr. Arlice Colt in [facility]   Multiple thyroid nodules 1995   per pt. resolved on its own follwed by endo then released   Neurogenic bladder disorder 12/2009   w/ urge & stress inc. dx. Dr. McDiarmid   Neuromuscular disorder [facility])    Multiple sclerosis   Neuropathy    Osteoarthritis    Osteoporosis    last DEXA 2018   Overactive bladder    sees Dr. Nicki Reaper McDiarmid    Pancreatitis 1977   Pneumonia    Ulcerative proctitis ([place])    Vision abnormalities    Past Surgical History:  Procedure Laterality Date   APPENDECTOMY     breast ruptured  left   ductal cyst   CARPAL TUNNEL RELEASE Right    CHOLECYSTECTOMY     COLONOSCOPY  01/10/09   repeat in 5 yrs Dr. Wilford Corner   CYSTOSCOPY WITH BIOPSY N/A 01/06/2022   Procedure: CYSTOSCOPY WITH BLADDER/URETHRAL BIOPSY;  Surgeon: Alexis Frock, MD;  [facility]: [facility];  Service: Urology;  Laterality: N/A;  [place]     x 3   left knee surgery  1978   to remove bone tumor     NASAL SINUS SURGERY Bilateral 01/13/2015   Procedure: ENDOSCOPIC SINUS SURGERY;  Surgeon:  Izora Gala, MD;  Location: [place];  Service: ENT;  Laterality: Bilateral;   NASAL SINUS SURGERY  05/2017   NASAL TURBINATE REDUCTION     pubo vaginal sling  10/2003   TONSILLECTOMY     vaginal cystocele repair     anterior   VAGINAL HYSTERECTOMY  1995   AUB & Endometriosis   widom teeth removal     Patient Active Problem List   Diagnosis Date Noted   Vertigo 08/05/2021   Chronic prescription opiate use 12/14/2018   Carpal tunnel syndrome, bilateral 09/13/2017   Sacroiliac joint pain 09/13/2017   Urinary urgency 06/22/2017   Sciatica, right side 06/17/2016   Dysesthesia 10/30/2015   Right carpal tunnel syndrome 10/30/2015   Sciatica of left side 07/16/2015   Chronic ethmoidal sinusitis 06/26/2015   High risk medication use 07/08/2014   Chronic fatigue 04/02/2014   Dysphagia 04/02/2014   Ataxic gait 04/02/2014   Vitamin D deficiency 06/08/2012   OVERACTIVE BLADDER 08/01/2009   Osteoporosis 08/01/2009   Depression with anxiety 07/04/2007   MULTIPLE SCLEROSIS 07/04/2007   OA (osteoarthritis) of knee  07/04/2007   HEADACHE 07/04/2007   COLONIC POLYPS, HX OF 07/04/2007    PCP: Charlane Ferretti, MD   REFERRING PROVIDER:   Rosemarie Ax, MD    REFERRING DIAG:  [phone] (ICD-10-CM) - Primary osteoarthritis of left knee      THERAPY DIAG:  Pain in joint of left knee  Muscle weakness (generalized)  Difficulty walking  Stiffness of left knee, not elsewhere classified  Rationale for Evaluation and Treatment: Rehabilitation  ONSET DATE: Jan 2024  SUBJECTIVE:   SUBJECTIVE STATEMENT:  Pt reports she has noticed her knee no longer buckles throughout the day. Notes improvement with sit to stands and decreased pain with sitting for prolonged periods.   Eval:  Pt reports that starting in January it started to bother her more. It has been hurting for quite some time with history of surgery. She has had 2x injections from Dr. Raeford Razor. There is roughly  a 25% improvement so far. Even sitting started to bother the knee. Pt states that just walking hurts the knee. Pt states that palpation to the knee and cyst does recreate. Pt states she is fairly sedentary but she does her errands with pain. Her MS effects her functional mobility and endurance. Pt does not enjoy the pool/aquatic therapy. Pt states that stairs bother the knee. Pt states that she can walk about 10-15 mins of walking before needing to sit. Pt feels like the knee buckles weakly.     PERTINENT HISTORY: 2/23 fusion of T7-S1 for scoliosis; L knee surgery medial condyle excision for osteochondritis desiccans; L meniscus tear; MS relapsing remitting  PAIN:  Are you having pain? Yes: NPRS scale: 5/10 Pain location: behind the knee the L knee, and lateral inferior patellar pole  Pain description: sharp, aching Aggravating factors: bending the knee, putting weight through the knee Relieving factors: resting, sitting   PRECAUTIONS: None  WEIGHT BEARING RESTRICTIONS: No  FALLS:  Has patient fallen in last 6 months? No  LIVING ENVIRONMENT: Lives with: lives alone Lives in: House/apartment Stairs: No Has following equipment at home: [facility] and walker, does not use either   OCCUPATION: ICU nurse, retired   PLOF: Independent with basic ADLs  PATIENT GOALS: Pt would like to avoid knee surgery.   NEXT MD VISIT:  This week of 2/19  OBJECTIVE:   DIAGNOSTIC FINDINGS:     IMPRESSION: 1. No acute findings. 2. Moderate tricompartmental osteoarthritic change most prominent over the patellofemoral joint. 3. 2.5 cm soft tissue calcification over the popliteal fossa possibly within a complicated Baker cyst.  PATIENT SURVEYS:  FOTO 44 FOTO 47 3/15 52 @ D/C 12 pts MCII   TODAY'S TREATMENT:                                                                                                                              DATE:   3/15 L knee Tib/fem grade III mobs ant/posterior glide L  knee PROM  Supine SLR 1lb  3x10L Cannot bridge due to back* Sidelying hip abduction 3x10L LAQ 3# 2x10ea Long sitting  HSS 30secx3 ea STS from plinth- lower than usual-2x10 GTB at knees  HR/TR 3x10 GTB at knees sidestepping 79ft 3x laps  Standing hip abduction 2# 3x10 bil Step ups 4" 1x10L Partial  lunge 1x10L in front Tandem balance 20"ea 3/13 L knee Tib/fem grade III mobs ant/posterior glide L knee PROM  Supine SLR 1lb 3x10L Hold on bridges Sidelying hip abduction 3x10L LAQ 3# 2x10 Long sitting  HSS 30secx3 ea STS from high table 3x8 GTB at knees  HR/TR 3x10 GTB at knees sidestepping 81ft 3x laps  Standing hip abduction 1# 3x10 bil  3/6 L knee Tib/fem grade III mobs ant/posterior glide L knee PROM  Supine SLR 1lb 3x10 Hold on bridges Sidelying hip abduction 3x10L LAQ 2# 2x10 5 sec hold Long sitting  HSS 30secx3 ea STS from high table 3x8 GTB at knees  HR/TR 3x10 GTB at knees sidestepping 2ft 3x laps  Standing hip abduction 1# 2x10 bil   PATIENT EDUCATION:  Education details: anatomy, exercise progression, DOMS expectations, muscle firing,  envelope of function, HEP, POC  Person educated: Patient Education method: Explanation, Demonstration, Tactile cues, Verbal cues, and Handouts Education comprehension: verbalized understanding, returned demonstration, verbal cues required, and tactile cues required  HOME EXERCISE PROGRAM:  Access Code: [email or web address] URL: [email or web address] Date: 04/26/2022 Prepared by: Daleen Bo  ASSESSMENT:  CLINICAL IMPRESSION: No complaints of knee pain with exercises today, even with STS from lower surface. Able to progress program to include step ups and partial lunges. She denied pain with these. Kept reps low with these to monitor for soreness over the following days. If she responds well we will progress this next session. FOTO score increased by 3 points today.  OBJECTIVE IMPAIRMENTS decreased mobility, difficulty  walking, decreased ROM, decreased strength, increased muscle spasms, improper body mechanics, postural dysfunction, and pain.    ACTIVITY LIMITATIONS lifting, sitting, standing, squatting, stairs, transfers, locomotion level   PARTICIPATION LIMITATIONS: cleaning, laundry, driving, shopping, community activity, occupation, yard work, and exercise   PERSONAL FACTORS Age, Fitness, Time since onset of injury/illness/exacerbation, and 2+ comorbidity:    are also affecting patient's functional outcome.    REHAB POTENTIAL: Good   CLINICAL DECISION MAKING: unstable/complicated   EVALUATION COMPLEXITY: Moderate     GOALS:     SHORT TERM GOALS: Target date: 06/07/2022   Pt will become independent with HEP in order to demonstrate synthesis of PT education..   Goal status: MET 3/13   2.  Pt will score at least 12 pt increase on FOTO to demonstrate functional improvement in MCII and pt perceived function.      Goal status: INITIAL   3.  Pt will be able to demonstrate/report ability to sit/stand/sleep for extended periods of time without pain in order to demonstrate functional improvement and tolerance to static positioning.    Goal status: INITIAL     LONG TERM GOALS: Target date: 07/19/2022    Pt  will become independent with final HEP in order to demonstrate synthesis of PT education.   Goal status: INITIAL   2.  Pt will score >/= 52 on FOTO to demonstrate improvement in perceived bilat knee function.    Goal status: INITIAL   3.  Pt will be able to demonstrate squat to at least parallel in order to demonstrate functional improvement in LE function for self-care and house hold duties.    Goal status: INITIAL   4.  Pt will be able to demonstrate kneeling to stand and stand to kneeling transfer without pain in order to demonstrate functional improvement in LE function for ADL/house hold duties.    Goal status: INITIAL       PLAN: PT FREQUENCY: 1-2x/week   PT DURATION: 12  weeks (likely DC in 8)   PLANNED INTERVENTIONS: Therapeutic exercises, Therapeutic activity, Neuromuscular re-education, Balance training, Gait training, Patient/Family education, Self Care, and Joint mobilization   PLAN FOR NEXT SESSION: continue with L quad and hip strength, knee joint TKE mobs  Graceann Congress Annastyn Silvey, PTA 05/21/2022, 11:31 AM

## 2022-05-26 ENCOUNTER — Ambulatory Visit (HOSPITAL_BASED_OUTPATIENT_CLINIC_OR_DEPARTMENT_OTHER): Payer: Medicare Other | Admitting: Physical Therapy

## 2022-05-26 ENCOUNTER — Encounter (HOSPITAL_BASED_OUTPATIENT_CLINIC_OR_DEPARTMENT_OTHER): Payer: Self-pay | Admitting: Physical Therapy

## 2022-05-26 DIAGNOSIS — M25562 Pain in left knee: Secondary | ICD-10-CM

## 2022-05-26 DIAGNOSIS — R262 Difficulty in walking, not elsewhere classified: Secondary | ICD-10-CM | POA: Diagnosis not present

## 2022-05-26 DIAGNOSIS — M25662 Stiffness of left knee, not elsewhere classified: Secondary | ICD-10-CM | POA: Diagnosis not present

## 2022-05-26 DIAGNOSIS — M6281 Muscle weakness (generalized): Secondary | ICD-10-CM

## 2022-05-26 NOTE — Therapy (Signed)
OUTPATIENT PHYSICAL THERAPY LOWER EXTREMITY TREATMENT   Patient Name: Sheri Burns MRN: [id number] DOB:06/29/1955, 67 y.o., female Today's Date: 05/26/2022  END OF SESSION:  PT End of Session - 05/26/22 0928     Visit Number 8    Number of Visits 16    Authorization Type BCBS MCR    PT Start Time 0930    PT Stop Time 1010    PT Time Calculation (min) 40 min    Activity Tolerance Patient tolerated treatment well    Behavior During Therapy WFL for tasks assessed/performed                Past Medical History:  Diagnosis Date   Anxiety    Bulging lumbar disc 1980   Chronic pansinusitis    Colon polyp    Depression    GERD (gastroesophageal reflux disease)    Gynecological examination    sees Dr. Caren Griffins Romine    Headache(784.0)    Hypertension    Multiple sclerosis ([place]) since 1992   Dr. Arlice Colt in [facility]   Multiple thyroid nodules 1995   per pt. resolved on its own follwed by endo then released   Neurogenic bladder disorder 12/2009   w/ urge & stress inc. dx. Dr. McDiarmid   Neuromuscular disorder [facility])    Multiple sclerosis   Neuropathy    Osteoarthritis    Osteoporosis    last DEXA 2018   Overactive bladder    sees Dr. Nicki Reaper McDiarmid    Pancreatitis 1977   Pneumonia    Ulcerative proctitis ([place])    Vision abnormalities    Past Surgical History:  Procedure Laterality Date   APPENDECTOMY     breast ruptured  left   ductal cyst   CARPAL TUNNEL RELEASE Right    CHOLECYSTECTOMY     COLONOSCOPY  01/10/09   repeat in 5 yrs Dr. Wilford Corner   CYSTOSCOPY WITH BIOPSY N/A 01/06/2022   Procedure: CYSTOSCOPY WITH BLADDER/URETHRAL BIOPSY;  Surgeon: Alexis Frock, MD;  Location: [facility];  Service: Urology;  Laterality: N/A;  [place]     x 3   left knee surgery  1978   to remove bone tumor     NASAL SINUS SURGERY Bilateral 01/13/2015   Procedure: ENDOSCOPIC SINUS SURGERY;  Surgeon:  Izora Gala, MD;  Location: [place];  Service: ENT;  Laterality: Bilateral;   NASAL SINUS SURGERY  05/2017   NASAL TURBINATE REDUCTION     pubo vaginal sling  10/2003   TONSILLECTOMY     vaginal cystocele repair     anterior   VAGINAL HYSTERECTOMY  1995   AUB & Endometriosis   widom teeth removal     Patient Active Problem List   Diagnosis Date Noted   Vertigo 08/05/2021   Chronic prescription opiate use 12/14/2018   Carpal tunnel syndrome, bilateral 09/13/2017   Sacroiliac joint pain 09/13/2017   Urinary urgency 06/22/2017   Sciatica, right side 06/17/2016   Dysesthesia 10/30/2015   Right carpal tunnel syndrome 10/30/2015   Sciatica of left side 07/16/2015   Chronic ethmoidal sinusitis 06/26/2015   High risk medication use 07/08/2014   Chronic fatigue 04/02/2014   Dysphagia 04/02/2014   Ataxic gait 04/02/2014   Vitamin D deficiency 06/08/2012   OVERACTIVE BLADDER 08/01/2009   Osteoporosis 08/01/2009   Depression with anxiety 07/04/2007   MULTIPLE SCLEROSIS 07/04/2007   OA (osteoarthritis) of knee  07/04/2007   HEADACHE 07/04/2007   COLONIC POLYPS, HX OF 07/04/2007    PCP: Charlane Ferretti, MD   REFERRING PROVIDER:   Rosemarie Ax, MD    REFERRING DIAG:  [phone] (ICD-10-CM) - Primary osteoarthritis of left knee      THERAPY DIAG:  Pain in joint of left knee  Muscle weakness (generalized)  Difficulty walking  Stiffness of left knee, not elsewhere classified  Rationale for Evaluation and Treatment: Rehabilitation  ONSET DATE: Jan 2024  SUBJECTIVE:   SUBJECTIVE STATEMENT:  Pt states the knee is much better. Pt states she can squat now to pick up something from the floor. Pain is much better as well.   Eval:  Pt reports that starting in January it started to bother her more. It has been hurting for quite some time with history of surgery. She has had 2x injections from Dr. Raeford Razor. There is roughly a 25% improvement so far. Even  sitting started to bother the knee. Pt states that just walking hurts the knee. Pt states that palpation to the knee and cyst does recreate. Pt states she is fairly sedentary but she does her errands with pain. Her MS effects her functional mobility and endurance. Pt does not enjoy the pool/aquatic therapy. Pt states that stairs bother the knee. Pt states that she can walk about 10-15 mins of walking before needing to sit. Pt feels like the knee buckles weakly.     PERTINENT HISTORY: 2/23 fusion of T7-S1 for scoliosis; L knee surgery medial condyle excision for osteochondritis desiccans; L meniscus tear; MS relapsing remitting  PAIN:  Are you having pain? Yes: NPRS scale: 3/10 Pain location: behind the knee the L knee, and lateral inferior patellar pole  Pain description: sharp, aching Aggravating factors: bending the knee, putting weight through the knee Relieving factors: resting, sitting   PRECAUTIONS: None  WEIGHT BEARING RESTRICTIONS: No  FALLS:  Has patient fallen in last 6 months? No  LIVING ENVIRONMENT: Lives with: lives alone Lives in: House/apartment Stairs: No Has following equipment at home: [facility] and walker, does not use either   OCCUPATION: ICU nurse, retired   PLOF: Independent with basic ADLs  PATIENT GOALS: Pt would like to avoid knee surgery.   NEXT MD VISIT:  This week of 2/19  OBJECTIVE:   DIAGNOSTIC FINDINGS:     IMPRESSION: 1. No acute findings. 2. Moderate tricompartmental osteoarthritic change most prominent over the patellofemoral joint. 3. 2.5 cm soft tissue calcification over the popliteal fossa possibly within a complicated Baker cyst.  PATIENT SURVEYS:  FOTO 44 FOTO 47 3/15 52 @ D/C 12 pts MCII   TODAY'S TREATMENT:                                                                                                                              DATE:   3/20 L knee Tib/fem grade IV TKE mob L knee PROM  Supine SLR 2 lb 3x10L Cannot  bridge due to back* Shuttle leg press- staggered 50lb 10x; 2x10 75lbs  Long sitting  HSS 30secx3 ea Mini-squat from bar 2x10 GTB at knees  HR/TR 2x10- staggered with L in rear  4" box heel tap (offload with R) 3x6 Tandem balance 30s 3x each  3/15 L knee Tib/fem grade III mobs ant/posterior glide L knee PROM  Supine SLR 1lb 3x10L Cannot bridge due to back* Sidelying hip abduction 3x10L LAQ 3# 2x10ea Long sitting  HSS 30secx3 ea STS from plinth- lower than usual-2x10 GTB at knees  HR/TR 3x10 GTB at knees sidestepping 90ft 3x laps  Standing hip abduction 2# 3x10 bil Step ups 4" 1x10L Partial  lunge 1x10L in front Tandem balance 20"ea 3/13 L knee Tib/fem grade III mobs ant/posterior glide L knee PROM  Supine SLR 1lb 3x10L Hold on bridges Sidelying hip abduction 3x10L LAQ 3# 2x10 Long sitting  HSS 30secx3 ea STS from high table 3x8 GTB at knees  HR/TR 3x10 GTB at knees sidestepping 71ft 3x laps  Standing hip abduction 1# 3x10 bil  3/6 L knee Tib/fem grade III mobs ant/posterior glide L knee PROM  Supine SLR 1lb 3x10 Hold on bridges Sidelying hip abduction 3x10L LAQ 2# 2x10 5 sec hold Long sitting  HSS 30secx3 ea STS from high table 3x8 GTB at knees  HR/TR 3x10 GTB at knees sidestepping 68ft 3x laps  Standing hip abduction 1# 2x10 bil   PATIENT EDUCATION:  Education details: anatomy, exercise progression, DOMS expectations, muscle firing,  envelope of function, HEP, POC  Person educated: Patient Education method: Explanation, Demonstration, Tactile cues, Verbal cues, and Handouts Education comprehension: verbalized understanding, returned demonstration, verbal cues required, and tactile cues required  HOME EXERCISE PROGRAM:  Access Code: [email or web address] URL: [email or web address] Date: 04/26/2022 Prepared by: Daleen Bo  ASSESSMENT:  CLINICAL IMPRESSION: Pt able to continue with progression of strength and ROM of the L knee today without  irritation. Pt also able to progress CKC exercise as well as intro to machine strengthening today wihtout increase in pain- feels only crepitus. Plan to update HEP at next session if no adverse response to today's session. Pt is making great improvements with functional mobility and transfers. Plan to continue with knee ext ROM and strength as tolerated. Plan to reduce frequency to 1x/week. Pt would benefit from continued skilled therapy in order to reach goals and maximize functional L LE strength and ROM for prevention of functional decline.  OBJECTIVE IMPAIRMENTS decreased mobility, difficulty walking, decreased ROM, decreased strength, increased muscle spasms, improper body mechanics, postural dysfunction, and pain.    ACTIVITY LIMITATIONS lifting, sitting, standing, squatting, stairs, transfers, locomotion level   PARTICIPATION LIMITATIONS: cleaning, laundry, driving, shopping, community activity, occupation, yard work, and exercise   PERSONAL FACTORS Age, Fitness, Time since onset of injury/illness/exacerbation, and 2+ comorbidity:    are also affecting patient's functional outcome.    REHAB POTENTIAL: Good   CLINICAL DECISION MAKING: unstable/complicated   EVALUATION COMPLEXITY: Moderate     GOALS:     SHORT TERM GOALS: Target date: 06/07/2022   Pt will become independent with HEP in order to demonstrate synthesis of PT education..   Goal status: MET 3/13   2.  Pt will score at least 12 pt increase on FOTO to demonstrate functional improvement in MCII and pt perceived function.      Goal status: ongoing   3.  Pt will be able to demonstrate/report ability to sit/stand/sleep for extended periods of time without pain in order to demonstrate functional  improvement and tolerance to static positioning.    Goal status: ongoing     LONG TERM GOALS: Target date: 07/19/2022    Pt  will become independent with final HEP in order to demonstrate synthesis of PT education.   Goal  status: MET   2.  Pt will score >/= 52 on FOTO to demonstrate improvement in perceived bilat knee function.    Goal status: ongoing   3.  Pt will be able to demonstrate squat to at least parallel in order to demonstrate functional improvement in LE function for self-care and house hold duties.    Goal status: ongoing   4.  Pt will be able to demonstrate kneeling to stand and stand to kneeling transfer without pain in order to demonstrate functional improvement in LE function for ADL/house hold duties.    Goal status: ongoing       PLAN: PT FREQUENCY: 1-2x/week   PT DURATION: 12 weeks (likely DC in 8)   PLANNED INTERVENTIONS: Therapeutic exercises, Therapeutic activity, Neuromuscular re-education, Balance training, Gait training, Patient/Family education, Self Care, and Joint mobilization   PLAN FOR NEXT SESSION: continue with L quad and hip strength, knee joint TKE mobs  Daleen Bo, PT 05/26/2022, 10:13 AM

## 2022-05-27 DIAGNOSIS — M25562 Pain in left knee: Secondary | ICD-10-CM | POA: Diagnosis not present

## 2022-05-28 ENCOUNTER — Ambulatory Visit (HOSPITAL_BASED_OUTPATIENT_CLINIC_OR_DEPARTMENT_OTHER): Payer: Medicare Other | Admitting: Physical Therapy

## 2022-05-28 ENCOUNTER — Encounter (HOSPITAL_BASED_OUTPATIENT_CLINIC_OR_DEPARTMENT_OTHER): Payer: Self-pay | Admitting: Physical Therapy

## 2022-05-28 DIAGNOSIS — M25662 Stiffness of left knee, not elsewhere classified: Secondary | ICD-10-CM

## 2022-05-28 DIAGNOSIS — M25562 Pain in left knee: Secondary | ICD-10-CM | POA: Diagnosis not present

## 2022-05-28 DIAGNOSIS — R262 Difficulty in walking, not elsewhere classified: Secondary | ICD-10-CM

## 2022-05-28 DIAGNOSIS — M6281 Muscle weakness (generalized): Secondary | ICD-10-CM

## 2022-05-28 NOTE — Therapy (Signed)
OUTPATIENT PHYSICAL THERAPY LOWER EXTREMITY TREATMENT   Patient Name: Sheri Burns MRN: [id number] DOB:1955-08-31, 66 y.o., female Today's Date: 05/28/2022  END OF SESSION:  PT End of Session - 05/28/22 1004     Visit Number 9    Number of Visits 16    Authorization Type BCBS MCR    PT Start Time 0930    PT Stop Time 1010    PT Time Calculation (min) 40 min    Activity Tolerance Patient tolerated treatment well    Behavior During Therapy WFL for tasks assessed/performed                 Past Medical History:  Diagnosis Date   Anxiety    Bulging lumbar disc 1980   Chronic pansinusitis    Colon polyp    Depression    GERD (gastroesophageal reflux disease)    Gynecological examination    sees Dr. Caren Griffins Romine    Headache(784.0)    Hypertension    Multiple sclerosis ([place]) since 1992   Dr. Arlice Colt in [facility]   Multiple thyroid nodules 1995   per pt. resolved on its own follwed by endo then released   Neurogenic bladder disorder 12/2009   w/ urge & stress inc. dx. Dr. McDiarmid   Neuromuscular disorder [facility])    Multiple sclerosis   Neuropathy    Osteoarthritis    Osteoporosis    last DEXA 2018   Overactive bladder    sees Dr. Nicki Reaper McDiarmid    Pancreatitis 1977   Pneumonia    Ulcerative proctitis ([place])    Vision abnormalities    Past Surgical History:  Procedure Laterality Date   APPENDECTOMY     breast ruptured  left   ductal cyst   CARPAL TUNNEL RELEASE Right    CHOLECYSTECTOMY     COLONOSCOPY  01/10/09   repeat in 5 yrs Dr. Wilford Corner   CYSTOSCOPY WITH BIOPSY N/A 01/06/2022   Procedure: CYSTOSCOPY WITH BLADDER/URETHRAL BIOPSY;  Surgeon: Alexis Frock, MD;  [facility]: [facility];  Service: Urology;  Laterality: N/A;  [place]     x 3   left knee surgery  1978   to remove bone tumor     NASAL SINUS SURGERY Bilateral 01/13/2015   Procedure: ENDOSCOPIC SINUS SURGERY;  Surgeon:  Izora Gala, MD;  Location: [place];  Service: ENT;  Laterality: Bilateral;   NASAL SINUS SURGERY  05/2017   NASAL TURBINATE REDUCTION     pubo vaginal sling  10/2003   TONSILLECTOMY     vaginal cystocele repair     anterior   VAGINAL HYSTERECTOMY  1995   AUB & Endometriosis   widom teeth removal     Patient Active Problem List   Diagnosis Date Noted   Vertigo 08/05/2021   Chronic prescription opiate use 12/14/2018   Carpal tunnel syndrome, bilateral 09/13/2017   Sacroiliac joint pain 09/13/2017   Urinary urgency 06/22/2017   Sciatica, right side 06/17/2016   Dysesthesia 10/30/2015   Right carpal tunnel syndrome 10/30/2015   Sciatica of left side 07/16/2015   Chronic ethmoidal sinusitis 06/26/2015   High risk medication use 07/08/2014   Chronic fatigue 04/02/2014   Dysphagia 04/02/2014   Ataxic gait 04/02/2014   Vitamin D deficiency 06/08/2012   OVERACTIVE BLADDER 08/01/2009   Osteoporosis 08/01/2009   Depression with anxiety 07/04/2007   MULTIPLE SCLEROSIS 07/04/2007   OA (osteoarthritis) of  knee 07/04/2007   HEADACHE 07/04/2007   COLONIC POLYPS, HX OF 07/04/2007    PCP: Charlane Ferretti, MD   REFERRING PROVIDER:   Rosemarie Ax, MD    REFERRING DIAG:  [id number] (ICD-10-CM) - Primary osteoarthritis of left knee      THERAPY DIAG:  Pain in joint of left knee  Muscle weakness (generalized)  Difficulty walking  Stiffness of left knee, not elsewhere classified  Rationale for Evaluation and Treatment: Rehabilitation  ONSET DATE: Jan 2024  SUBJECTIVE:   SUBJECTIVE STATEMENT:  Pt states that the muscle was sore after last session but no increase in pain. Pt saw surgeon yesterday and would not be performing the ablation but will be aspirating the Baker's cyst. Next Thursday.   Eval:  Pt reports that starting in January it started to bother her more. It has been hurting for quite some time with history of surgery. She has had 2x injections  from Dr. Raeford Razor. There is roughly a 25% improvement so far. Even sitting started to bother the knee. Pt states that just walking hurts the knee. Pt states that palpation to the knee and cyst does recreate. Pt states she is fairly sedentary but she does her errands with pain. Her MS effects her functional mobility and endurance. Pt does not enjoy the pool/aquatic therapy. Pt states that stairs bother the knee. Pt states that she can walk about 10-15 mins of walking before needing to sit. Pt feels like the knee buckles weakly.     PERTINENT HISTORY: 2/23 fusion of T7-S1 for scoliosis; L knee surgery medial condyle excision for osteochondritis desiccans; L meniscus tear; MS relapsing remitting  PAIN:  Are you having pain? Yes: NPRS scale: 3/10 Pain location: behind the knee the L knee, and lateral inferior patellar pole  Pain description: sharp, aching Aggravating factors: bending the knee, putting weight through the knee Relieving factors: resting, sitting   PRECAUTIONS: None  WEIGHT BEARING RESTRICTIONS: No  FALLS:  Has patient fallen in last 6 months? No  LIVING ENVIRONMENT: Lives with: lives alone Lives in: House/apartment Stairs: No Has following equipment at home: [facility] and walker, does not use either   OCCUPATION: ICU nurse, retired   PLOF: Independent with basic ADLs  PATIENT GOALS: Pt would like to avoid knee surgery.   NEXT MD VISIT:  This week of 2/19  OBJECTIVE:   DIAGNOSTIC FINDINGS:     IMPRESSION: 1. No acute findings. 2. Moderate tricompartmental osteoarthritic change most prominent over the patellofemoral joint. 3. 2.5 cm soft tissue calcification over the popliteal fossa possibly within a complicated Baker cyst.  PATIENT SURVEYS:  FOTO 44 FOTO 47 3/15 52 @ D/C 12 pts MCII   TODAY'S TREATMENT:                                                                                                                              DATE:   3/22 L knee Tib/fem  grade IV TKE  mob  *Cannot bridge due to back*  Shuttle leg press- single leg 50lb 3x8 Long sitting  HSS 30secx3 ea Staggered stance mini-squat from bar 3x8 6" box step up 4x6    3/20 L knee Tib/fem grade IV TKE mob L knee PROM  Supine SLR 2 lb 3x10L Cannot bridge due to back* Shuttle leg press- staggered 50lb 10x; 2x10 75lbs  Long sitting  HSS 30secx3 ea Mini-squat from bar 2x10 GTB at knees  HR/TR 2x10- staggered with L in rear  4" box heel tap (offload with R) 3x6 Tandem balance 30s 3x each  3/15 L knee Tib/fem grade III mobs ant/posterior glide L knee PROM  Supine SLR 1lb 3x10L Cannot bridge due to back* Sidelying hip abduction 3x10L LAQ 3# 2x10ea Long sitting  HSS 30secx3 ea STS from plinth- lower than usual-2x10 GTB at knees  HR/TR 3x10 GTB at knees sidestepping 69ft 3x laps  Standing hip abduction 2# 3x10 bil Step ups 4" 1x10L Partial  lunge 1x10L in front Tandem balance 20"ea 3/13 L knee Tib/fem grade III mobs ant/posterior glide L knee PROM  Supine SLR 1lb 3x10L Hold on bridges Sidelying hip abduction 3x10L LAQ 3# 2x10 Long sitting  HSS 30secx3 ea STS from high table 3x8 GTB at knees  HR/TR 3x10 GTB at knees sidestepping 81ft 3x laps  Standing hip abduction 1# 3x10 bil  3/6 L knee Tib/fem grade III mobs ant/posterior glide L knee PROM  Supine SLR 1lb 3x10 Hold on bridges Sidelying hip abduction 3x10L LAQ 2# 2x10 5 sec hold Long sitting  HSS 30secx3 ea STS from high table 3x8 GTB at knees  HR/TR 3x10 GTB at knees sidestepping 59ft 3x laps  Standing hip abduction 1# 2x10 bil   PATIENT EDUCATION:  Education details: anatomy, exercise progression, DOMS expectations, muscle firing,  envelope of function, HEP, POC  Person educated: Patient Education method: Explanation, Demonstration, Tactile cues, Verbal cues, and Handouts Education comprehension: verbalized understanding, returned demonstration, verbal cues required, and tactile cues  required  HOME EXERCISE PROGRAM:  Access Code: [email or web address] URL: [email or web address] Date: 04/26/2022 Prepared by: Daleen Bo  ASSESSMENT:  CLINICAL IMPRESSION: Pt with mild DOMS after last session but with good tolerance to exercise without increase in knee joint pain. Exercise progressed at today's session and HEP updated accordingly. Pt is able to now continue with progressive SL strength as tolerated. Consider more weighted CKC and [place] knee extension at future visits. SL stability also likely to aid in functional mobility. Pt will have upcoming aspiration of the Baker's cyst next week which may affect POC. Pt would benefit from continued skilled therapy in order to reach goals and maximize functional L LE strength and ROM for prevention of functional decline.  OBJECTIVE IMPAIRMENTS decreased mobility, difficulty walking, decreased ROM, decreased strength, increased muscle spasms, improper body mechanics, postural dysfunction, and pain.    ACTIVITY LIMITATIONS lifting, sitting, standing, squatting, stairs, transfers, locomotion level   PARTICIPATION LIMITATIONS: cleaning, laundry, driving, shopping, community activity, occupation, yard work, and exercise   PERSONAL FACTORS Age, Fitness, Time since onset of injury/illness/exacerbation, and 2+ comorbidity:    are also affecting patient's functional outcome.    REHAB POTENTIAL: Good   CLINICAL DECISION MAKING: unstable/complicated   EVALUATION COMPLEXITY: Moderate     GOALS:     SHORT TERM GOALS: Target date: 06/07/2022   Pt will become independent with HEP in order to demonstrate synthesis of PT education..   Goal status: MET 3/13   2.  Pt will score at least  12 pt increase on FOTO to demonstrate functional improvement in MCII and pt perceived function.      Goal status: ongoing   3.  Pt will be able to demonstrate/report ability to sit/stand/sleep for extended periods of time without pain in order to demonstrate  functional improvement and tolerance to static positioning.    Goal status: ongoing     LONG TERM GOALS: Target date: 07/19/2022    Pt  will become independent with final HEP in order to demonstrate synthesis of PT education.   Goal status: MET   2.  Pt will score >/= 52 on FOTO to demonstrate improvement in perceived bilat knee function.    Goal status: ongoing   3.  Pt will be able to demonstrate squat to at least parallel in order to demonstrate functional improvement in LE function for self-care and house hold duties.    Goal status: ongoing   4.  Pt will be able to demonstrate kneeling to stand and stand to kneeling transfer without pain in order to demonstrate functional improvement in LE function for ADL/house hold duties.    Goal status: ongoing       PLAN: PT FREQUENCY: 1-2x/week   PT DURATION: 12 weeks (likely DC in 8)   PLANNED INTERVENTIONS: Therapeutic exercises, Therapeutic activity, Neuromuscular re-education, Balance training, Gait training, Patient/Family education, Self Care, and Joint mobilization   PLAN FOR NEXT SESSION: continue with L quad and hip strength, knee joint TKE mobs  Daleen Bo, PT 05/28/2022, 10:17 AM

## 2022-06-01 DIAGNOSIS — K080 Exfoliation of teeth due to systemic causes: Secondary | ICD-10-CM | POA: Diagnosis not present

## 2022-06-02 ENCOUNTER — Ambulatory Visit (HOSPITAL_BASED_OUTPATIENT_CLINIC_OR_DEPARTMENT_OTHER): Payer: Medicare Other | Admitting: Physical Therapy

## 2022-06-02 ENCOUNTER — Encounter (HOSPITAL_BASED_OUTPATIENT_CLINIC_OR_DEPARTMENT_OTHER): Payer: Self-pay | Admitting: Physical Therapy

## 2022-06-02 DIAGNOSIS — M25562 Pain in left knee: Secondary | ICD-10-CM | POA: Diagnosis not present

## 2022-06-02 DIAGNOSIS — M6281 Muscle weakness (generalized): Secondary | ICD-10-CM | POA: Diagnosis not present

## 2022-06-02 DIAGNOSIS — M25662 Stiffness of left knee, not elsewhere classified: Secondary | ICD-10-CM

## 2022-06-02 DIAGNOSIS — R262 Difficulty in walking, not elsewhere classified: Secondary | ICD-10-CM | POA: Diagnosis not present

## 2022-06-02 NOTE — Therapy (Signed)
OUTPATIENT PHYSICAL THERAPY LOWER EXTREMITY TREATMENT  Progress Note  Reporting Period 2/19 to 06/02/22  See note below for Objective Data and Assessment of Progress/Goals.       Patient Name: Sheri Burns MRN: [id number] DOB:[birth year], 67 y.o., female Today's Date: 06/02/2022  END OF SESSION:  PT End of Session - 06/02/22 0927     Visit Number 10    Number of Visits 16    Authorization Type BCBS MCR    PT Start Time 0930    PT Stop Time 1010    PT Time Calculation (min) 40 min    Activity Tolerance Patient tolerated treatment well    Behavior During Therapy WFL for tasks assessed/performed                 Past Medical History:  Diagnosis Date   Anxiety    Bulging lumbar disc 1980   Chronic pansinusitis    Colon polyp    Depression    GERD (gastroesophageal reflux disease)    Gynecological examination    sees Dr. Caren Griffins Rom[place]    Headache(784.0)    Hypertension    Multiple sclerosis ([place]) since 1992   Dr. Arlice Colt in [facility]   Multiple thyroid nodules 1995   per pt. resolved on its own follwed by endo then released   Neurogenic bladder disorder 12/2009   w/ urge & stress inc. dx. Dr. McDiarmid   Neuromuscular disorder [facility])    Multiple sclerosis   Neuropathy    Osteoarthritis    Osteoporosis    last DEXA 2018   Overactive bladder    sees Dr. Nicki Reaper McDiarmid    Pancreatitis 1977   Pneumonia    Ulcerative proctitis ([place])    Vision abnormalities    Past Surgical History:  Procedure Laterality Date   APPENDECTOMY     breast ruptured  left   ductal cyst   CARPAL TUNNEL RELEASE Right    CHOLECYSTECTOMY     COLONOSCOPY  01/10/09   repeat in 5 yrs Dr. Wilford Corner   CYSTOSCOPY WITH BIOPSY N/A 01/06/2022   Procedure: CYSTOSCOPY WITH BLADDER/URETHRAL BIOPSY;  Surgeon: Alexis Frock, MD;  Location: [facility];  Service: Urology;  Laterality: N/A;  [place]     x 3   left knee  surgery  1978   to remove bone tumor     NASAL SINUS SURGERY Bilateral 01/13/2015   Procedure: ENDOSCOPIC SINUS SURGERY;  Surgeon: Izora Gala, MD;  Location: [place];  Service: ENT;  Laterality: Bilateral;   NASAL SINUS SURGERY  05/2017   NASAL TURBINATE REDUCTION     pubo vaginal sling  10/2003   TONSILLECTOMY     vaginal cystocele repair     anterior   VAGINAL HYSTERECTOMY  1995   AUB & Endometriosis   widom teeth removal     Patient Active Problem List   Diagnosis Date Noted   Vertigo 08/05/2021   Chronic prescription opiate use 12/14/2018   Carpal tunnel syndrome, bilateral 09/13/2017   Sacroiliac joint pain 09/13/2017   Urinary urgency 06/22/2017   Sciatica, right side 06/17/2016   Dysesthesia 10/30/2015   Right carpal tunnel syndrome 10/30/2015   Sciatica of left side 07/16/2015   Chronic ethmoidal sinusitis 06/26/2015   High risk medication use 07/08/2014   Chronic fatigue 04/02/2014   Dysphagia 04/02/2014   Ataxic gait 04/02/2014   Vitamin D deficiency 06/08/2012  OVERACTIVE BLADDER 08/01/2009   Osteoporosis 08/01/2009   Depression with anxiety 07/04/2007   MULTIPLE SCLEROSIS 07/04/2007   OA (osteoarthritis) of knee 07/04/2007   HEADACHE 07/04/2007   COLONIC POLYPS, HX OF 07/04/2007    PCP: Charlane Ferretti, MD   REFERRING PROVIDER:   Rosemarie Ax, MD    REFERRING DIAG:  [phone] (ICD-10-CM) - Primary osteoarthritis of left knee      THERAPY DIAG:  Pain in joint of left knee  Muscle weakness (generalized)  Difficulty walking  Stiffness of left knee, not elsewhere classified  Rationale for Evaluation and Treatment: Rehabilitation  ONSET DATE: Jan 2024  SUBJECTIVE:   SUBJECTIVE STATEMENT:  Pt states that the knee is still sore at baseline but able to walk longer. Pt states she can now squat to pick up things from the ground. Transfers are much easier.   Eval:  Pt reports that starting in January it started to bother her  more. It has been hurting for quite some time with history of surgery. She has had 2x injections from Dr. Raeford Razor. There is roughly a 25% improvement so far. Even sitting started to bother the knee. Pt states that just walking hurts the knee. Pt states that palpation to the knee and cyst does recreate. Pt states she is fairly sedentary but she does her errands with pain. Her MS effects her functional mobility and endurance. Pt does not enjoy the pool/aquatic therapy. Pt states that stairs bother the knee. Pt states that she can walk about 10-15 mins of walking before needing to sit. Pt feels like the knee buckles weakly.     PERTINENT HISTORY: 2/23 fusion of T7-S1 for scoliosis; L knee surgery medial condyle excision for osteochondritis desiccans; L meniscus tear; MS relapsing remitting  PAIN:  Are you having pain? Yes: NPRS scale: 3/10 Pain location: behind the knee the L knee, and lateral inferior patellar pole  Pain description: sharp, aching Aggravating factors: bending the knee, putting weight through the knee Relieving factors: resting, sitting   PRECAUTIONS: None  WEIGHT BEARING RESTRICTIONS: No  FALLS:  Has patient fallen in last 6 months? No  LIVING ENVIRONMENT: Lives with: lives alone Lives in: House/apartment Stairs: No Has following equipment at home: [facility] and walker, does not use either   OCCUPATION: ICU nurse, retired   PLOF: Independent with basic ADLs  PATIENT GOALS: Pt would like to avoid knee surgery.    OBJECTIVE:   FOTO 3/15 47  FOTO 3/27 57 (exceeded expectations)   LOWER EXTREMITY ROM:   Active ROM Right eval Left eval L 3/27  Hip flexion 115 115   Knee flexion 130 115 121  Knee extension 2 -10 -2   (Blank rows = not tested)   LOWER EXTREMITY MMT:   MMT Right eval Left eval L 3/27  Hip flexion       Hip extension       Hip abduction 21.4 19.7 31.9  Hip adduction       Hip internal rotation       Hip external rotation       Knee flexion  17.8 12.6 38.7  Knee extension 19.2 15.2 32.1   (Blank rows = not tested)     FUNCTIONAL TESTS:  Timed up and go (TUG): <10s   DIAGNOSTIC FINDINGS:     IMPRESSION: 1. No acute findings. 2. Moderate tricompartmental osteoarthritic change most prominent over the patellofemoral joint. 3. 2.5 cm soft tissue calcification over the popliteal fossa possibly within a  complicated Baker cyst.  PATIENT SURVEYS:  FOTO 54 FOTO 47 3/15 52 @ D/C 12 pts MCII   TODAY'S TREATMENT:                                                                                                                              DATE:   3/27  L knee Tib/fem grade IV TKE mob  *Cannot bridge due to back*  Knee extension machine up 2 down1 3x6 10lbs  HS curl machine 25lbs 2x10 Lunge holds 10s 3x each  Standing hip ABD GTB 2x10 8" box step up 4x6    3/20 L knee Tib/fem grade IV TKE mob L knee PROM  Supine SLR 2 lb 3x10L Cannot bridge due to back* Shuttle leg press- staggered 50lb 10x; 2x10 75lbs  Long sitting  HSS 30secx3 ea Mini-squat from bar 2x10 GTB at knees  HR/TR 2x10- staggered with L in rear  4" box heel tap (offload with R) 3x6 Tandem balance 30s 3x each  3/15 L knee Tib/fem grade III mobs ant/posterior glide L knee PROM  Supine SLR 1lb 3x10L Cannot bridge due to back* Sidelying hip abduction 3x10L LAQ 3# 2x10ea Long sitting  HSS 30secx3 ea STS from plinth- lower than usual-2x10 GTB at knees  HR/TR 3x10 GTB at knees sidestepping 36ft 3x laps  Standing hip abduction 2# 3x10 bil Step ups 4" 1x10L Partial  lunge 1x10L in front Tandem balance 20"ea 3/13 L knee Tib/fem grade III mobs ant/posterior glide L knee PROM  Supine SLR 1lb 3x10L Hold on bridges Sidelying hip abduction 3x10L LAQ 3# 2x10 Long sitting  HSS 30secx3 ea STS from high table 3x8 GTB at knees  HR/TR 3x10 GTB at knees sidestepping 21ft 3x laps  Standing hip abduction 1# 3x10 bil  3/6 L knee Tib/fem grade III  mobs ant/posterior glide L knee PROM  Supine SLR 1lb 3x10 Hold on bridges Sidelying hip abduction 3x10L LAQ 2# 2x10 5 sec hold Long sitting  HSS 30secx3 ea STS from high table 3x8 GTB at knees  HR/TR 3x10 GTB at knees sidestepping 58ft 3x laps  Standing hip abduction 1# 2x10 bil   PATIENT EDUCATION:  Education details: anatomy, exercise progression, DOMS expectations, muscle firing,  envelope of function, HEP, POC  Person educated: Patient Education method: Explanation, Demonstration, Tactile cues, Verbal cues, and Handouts Education comprehension: verbalized understanding, returned demonstration, verbal cues required, and tactile cues required  HOME EXERCISE PROGRAM:  Access Code: [email or web address] URL: [email or web address] Date: 04/26/2022 Prepared by: Daleen Bo  ASSESSMENT:  CLINICAL IMPRESSION: Pt with significant increase in L LE strength and ROM measures at today's session. Pt has also exceeded expected FOTO outcome measure expectations at PN visit. Pt progressing towards independent gym based exercise program. Pt will have upcoming aspiration of the Baker's cyst next week which may affect POC. Plan to progress PT exercise to gym based movements for independent strengthening. Pt is still limited with SL strength in CKC or  with transfers at this time.  Pt would benefit from continued skilled therapy in order to reach goals and maximize functional L LE strength and ROM for prevention of functional decline.  OBJECTIVE IMPAIRMENTS decreased mobility, difficulty walking, decreased ROM, decreased strength, increased muscle spasms, improper body mechanics, postural dysfunction, and pain.    ACTIVITY LIMITATIONS lifting, sitting, standing, squatting, stairs, transfers, locomotion level   PARTICIPATION LIMITATIONS: cleaning, laundry, driving, shopping, community activity, occupation, yard work, and exercise   PERSONAL FACTORS Age, Fitness, Time since onset of  injury/illness/exacerbation, and 2+ comorbidity:    are also affecting patient's functional outcome.    REHAB POTENTIAL: Good   CLINICAL DECISION MAKING: unstable/complicated   EVALUATION COMPLEXITY: Moderate     GOALS:     SHORT TERM GOALS: Target date: 06/07/2022   Pt will become independent with HEP in order to demonstrate synthesis of PT education..   Goal status: MET 3/13   2.  Pt will score at least 12 pt increase on FOTO to demonstrate functional improvement in MCII and pt perceived function.      Goal status: MET   3.  Pt will be able to demonstrate/report ability to sit/stand/sleep for extended periods of time without pain in order to demonstrate functional improvement and tolerance to static positioning.    Goal status: MET     LONG TERM GOALS: Target date: 07/19/2022    Pt  will become independent with final HEP in order to demonstrate synthesis of PT education.   Goal status: MET   2.  Pt will score >/= 52 on FOTO to demonstrate improvement in perceived bilat knee function.    Goal status: MET   3.  Pt will be able to demonstrate squat to at least parallel in order to demonstrate functional improvement in LE function for self-care and house hold duties.    Goal status: ongoing   4.  Pt will be able to demonstrate kneeling to stand and stand to kneeling transfer without pain in order to demonstrate functional improvement in LE function for ADL/house hold duties.    Goal status: ongoing       PLAN: PT FREQUENCY: 1-2x/week   PT DURATION: 12 weeks (likely DC in 8)   PLANNED INTERVENTIONS: Therapeutic exercises, Therapeutic activity, Neuromuscular re-education, Balance training, Gait training, Patient/Family education, Self Care, and Joint mobilization   PLAN FOR NEXT SESSION: continue with L quad and hip strength, knee joint TKE mobs  Daleen Bo, PT 06/02/2022, 10:13 AM

## 2022-06-03 DIAGNOSIS — M7122 Synovial cyst of popliteal space [Baker], left knee: Secondary | ICD-10-CM | POA: Diagnosis not present

## 2022-06-07 ENCOUNTER — Ambulatory Visit (HOSPITAL_BASED_OUTPATIENT_CLINIC_OR_DEPARTMENT_OTHER): Payer: Medicare Other | Attending: Family Medicine | Admitting: Physical Therapy

## 2022-06-07 ENCOUNTER — Encounter (HOSPITAL_BASED_OUTPATIENT_CLINIC_OR_DEPARTMENT_OTHER): Payer: Self-pay | Admitting: Physical Therapy

## 2022-06-07 DIAGNOSIS — R262 Difficulty in walking, not elsewhere classified: Secondary | ICD-10-CM | POA: Insufficient documentation

## 2022-06-07 DIAGNOSIS — M25662 Stiffness of left knee, not elsewhere classified: Secondary | ICD-10-CM | POA: Insufficient documentation

## 2022-06-07 DIAGNOSIS — M6281 Muscle weakness (generalized): Secondary | ICD-10-CM | POA: Insufficient documentation

## 2022-06-07 DIAGNOSIS — M25562 Pain in left knee: Secondary | ICD-10-CM | POA: Insufficient documentation

## 2022-06-07 NOTE — Therapy (Signed)
OUTPATIENT PHYSICAL THERAPY LOWER EXTREMITY TREATMENT  Progress Note  Reporting Period 2/19 to 06/02/22  See note below for Objective Data and Assessment of Progress/Goals.       Patient Name: Sheri Burns MRN: [id number] DOB:04-17-1955, 67 y.o., female Today's Date: 06/07/2022  END OF SESSION:  PT End of Session - 06/07/22 1247     Visit Number 11    Number of Visits 16    Authorization Type BCBS MCR    PT Start Time 1145    PT Stop Time 1225    PT Time Calculation (min) 40 min    Activity Tolerance Patient tolerated treatment well    Behavior During Therapy WFL for tasks assessed/performed                  Past Medical History:  Diagnosis Date   Anxiety    Bulging lumbar disc 1980   Chronic pansinusitis    Colon polyp    Depression    GERD (gastroesophageal reflux disease)    Gynecological examination    sees Dr. Elyse Hsu    [id number])    Hypertension    Multiple sclerosis since 1992   Dr. Arlice Colt in [facility]   Multiple thyroid nodules 1995   per pt. resolved on its own follwed by endo then released   Neurogenic bladder disorder 12/2009   w/ urge & stress inc. dx. Dr. McDiarmid   Neuromuscular disorder    Multiple sclerosis   Neuropathy    Osteoarthritis    Osteoporosis    last DEXA 2018   Overactive bladder    sees Dr. Nicki Reaper McDiarmid    Pancreatitis 1977   Pneumonia    Ulcerative proctitis    Vision abnormalities    Past Surgical History:  Procedure Laterality Date   APPENDECTOMY     breast ruptured  left   ductal cyst   CARPAL TUNNEL RELEASE Right    CHOLECYSTECTOMY     COLONOSCOPY  01/10/09   repeat in 5 yrs Dr. Wilford Corner   CYSTOSCOPY WITH BIOPSY N/A 01/06/2022   Procedure: CYSTOSCOPY WITH BLADDER/URETHRAL BIOPSY;  Surgeon: Alexis Frock, MD;  [facility];  Service: Urology;  Laterality: N/A;  [place]     x 3   left knee surgery  1978   to  remove bone tumor     NASAL SINUS SURGERY Bilateral 01/13/2015   Procedure: ENDOSCOPIC SINUS SURGERY;  Surgeon: Izora Gala, MD;  Location: [place];  Service: ENT;  Laterality: Bilateral;   NASAL SINUS SURGERY  05/2017   NASAL TURBINATE REDUCTION     pubo vaginal sling  10/2003   TONSILLECTOMY     vaginal cystocele repair     anterior   VAGINAL HYSTERECTOMY  1995   AUB & Endometriosis   widom teeth removal     Patient Active Problem List   Diagnosis Date Noted   Vertigo 08/05/2021   Chronic prescription opiate use 12/14/2018   Carpal tunnel syndrome, bilateral 09/13/2017   Sacroiliac joint pain 09/13/2017   Urinary urgency 06/22/2017   Sciatica, right side 06/17/2016   Dysesthesia 10/30/2015   Right carpal tunnel syndrome 10/30/2015   Sciatica of left side 07/16/2015   Chronic ethmoidal sinusitis 06/26/2015   High risk medication use 07/08/2014   Chronic fatigue 04/02/2014   Dysphagia 04/02/2014   Ataxic gait 04/02/2014   Vitamin D deficiency 06/08/2012   OVERACTIVE  BLADDER 08/01/2009   Osteoporosis 08/01/2009   Depression with anxiety 07/04/2007   MULTIPLE SCLEROSIS 07/04/2007   OA (osteoarthritis) of knee 07/04/2007   HEADACHE 07/04/2007   COLONIC POLYPS, HX OF 07/04/2007    PCP: Charlane Ferretti, MD   REFERRING PROVIDER:   Rosemarie Ax, MD    REFERRING DIAG:  [phone] (ICD-10-CM) - Primary osteoarthritis of left knee      THERAPY DIAG:  Pain in joint of left knee  Muscle weakness (generalized)  Difficulty walking  Stiffness of left knee, not elsewhere classified  Rationale for Evaluation and Treatment: Rehabilitation  ONSET DATE: Jan 2024  SUBJECTIVE:   SUBJECTIVE STATEMENT:  Pt states she was able to walk 6/10th of a mile with walking stick and was sore into the knee but no increase in pain.   Eval:  Pt reports that starting in January it started to bother her more. It has been hurting for quite some time with history of  surgery. She has had 2x injections from Dr. Raeford Razor. There is roughly a 25% improvement so far. Even sitting started to bother the knee. Pt states that just walking hurts the knee. Pt states that palpation to the knee and cyst does recreate. Pt states she is fairly sedentary but she does her errands with pain. Her MS effects her functional mobility and endurance. Pt does not enjoy the pool/aquatic therapy. Pt states that stairs bother the knee. Pt states that she can walk about 10-15 mins of walking before needing to sit. Pt feels like the knee buckles weakly.     PERTINENT HISTORY: 2/23 fusion of T7-S1 for scoliosis; L knee surgery medial condyle excision for osteochondritis desiccans; L meniscus tear; MS relapsing remitting  PAIN:  Are you having pain? Yes: NPRS scale: 2/10 Pain location: behind the knee the L knee, and lateral inferior patellar pole  Pain description: sharp, aching Aggravating factors: bending the knee, putting weight through the knee Relieving factors: resting, sitting   PRECAUTIONS: None  WEIGHT BEARING RESTRICTIONS: No  FALLS:  Has patient fallen in last 6 months? No  LIVING ENVIRONMENT: Lives with: lives alone Lives in: House/apartment Stairs: No Has following equipment at home: [facility] and walker, does not use either   OCCUPATION: ICU nurse, retired   PLOF: Independent with basic ADLs  PATIENT GOALS: Pt would like to avoid knee surgery.    OBJECTIVE:   FOTO 3/15 47  FOTO 3/27 57 (exceeded expectations)   LOWER EXTREMITY ROM:   Active ROM Right eval Left eval L 3/27  Hip flexion 115 115   Knee flexion 130 115 121  Knee extension 2 -10 -2   (Blank rows = not tested)   LOWER EXTREMITY MMT:   MMT Right eval Left eval L 3/27  Hip flexion       Hip extension       Hip abduction 21.4 19.7 31.9  Hip adduction       Hip internal rotation       Hip external rotation       Knee flexion 17.8 12.6 38.7  Knee extension 19.2 15.2 32.1   (Blank rows  = not tested)     FUNCTIONAL TESTS:  Timed up and go (TUG): <10s   DIAGNOSTIC FINDINGS:     IMPRESSION: 1. No acute findings. 2. Moderate tricompartmental osteoarthritic change most prominent over the patellofemoral joint. 3. 2.5 cm soft tissue calcification over the popliteal fossa possibly within a complicated Baker cyst.  PATIENT SURVEYS:  FOTO  82 FOTO 47 3/15 52 @ D/C 12 pts MCII   TODAY'S TREATMENT:                                                                                                                              DATE:   4/1 L knee Tib/fem grade IV TKE mob  STM L HS Supine HS stretch 10s 10x     Frequency, volume, intensity modifications; walking program increase, self recovery and massage   3/27  L knee Tib/fem grade IV TKE mob  *Cannot bridge due to back*  Knee extension machine up 2 down1 3x6 10lbs  HS curl machine 25lbs 2x10 Lunge holds 10s 3x each  Standing hip ABD GTB 2x10 8" box step up 4x6    3/20 L knee Tib/fem grade IV TKE mob L knee PROM  Supine SLR 2 lb 3x10L Cannot bridge due to back* Shuttle leg press- staggered 50lb 10x; 2x10 75lbs  Long sitting  HSS 30secx3 ea Mini-squat from bar 2x10 GTB at knees  HR/TR 2x10- staggered with L in rear  4" box heel tap (offload with R) 3x6 Tandem balance 30s 3x each  3/15 L knee Tib/fem grade III mobs ant/posterior glide L knee PROM  Supine SLR 1lb 3x10L Cannot bridge due to back* Sidelying hip abduction 3x10L LAQ 3# 2x10ea Long sitting  HSS 30secx3 ea STS from plinth- lower than usual-2x10 GTB at knees  HR/TR 3x10 GTB at knees sidestepping 47ft 3x laps  Standing hip abduction 2# 3x10 bil Step ups 4" 1x10L Partial  lunge 1x10L in front Tandem balance 20"ea 3/13 L knee Tib/fem grade III mobs ant/posterior glide L knee PROM  Supine SLR 1lb 3x10L Hold on bridges Sidelying hip abduction 3x10L LAQ 3# 2x10 Long sitting  HSS 30secx3 ea STS from high table 3x8 GTB at knees   HR/TR 3x10 GTB at knees sidestepping 24ft 3x laps  Standing hip abduction 1# 3x10 bil  3/6 L knee Tib/fem grade III mobs ant/posterior glide L knee PROM  Supine SLR 1lb 3x10 Hold on bridges Sidelying hip abduction 3x10L LAQ 2# 2x10 5 sec hold Long sitting  HSS 30secx3 ea STS from high table 3x8 GTB at knees  HR/TR 3x10 GTB at knees sidestepping 91ft 3x laps  Standing hip abduction 1# 2x10 bil   PATIENT EDUCATION:  Education details: anatomy, exercise progression, DOMS expectations, muscle firing,  envelope of function, HEP, POC  Person educated: Patient Education method: Explanation, Demonstration, Tactile cues, Verbal cues, and Handouts Education comprehension: verbalized understanding, returned demonstration, verbal cues required, and tactile cues required  HOME EXERCISE PROGRAM:  Access Code: [email or web address] URL: [email or web address] Date: 04/26/2022 Prepared by: Daleen Bo  ASSESSMENT:  CLINICAL IMPRESSION: Pt presents to session with increase in L knee joint stiffness today with lack of full TKE that appears likely due to recent increase in walking volume. Pt has improved greatly with functional mobility and continues to make improvements. However, pt given thorough edu about  managing loading volume as she continues to progress with exercise and to utilize self recovery techniques in order to manage stiffness and soreness. Pt did reach full TKE by end of session and increased R step length. Plan to progress with CKC and SL stability at future sessions.  Pt would benefit from continued skilled therapy in order to reach goals and maximize functional L LE strength and ROM for prevention of functional decline.  OBJECTIVE IMPAIRMENTS decreased mobility, difficulty walking, decreased ROM, decreased strength, increased muscle spasms, improper body mechanics, postural dysfunction, and pain.    ACTIVITY LIMITATIONS lifting, sitting, standing, squatting, stairs, transfers,  locomotion level   PARTICIPATION LIMITATIONS: cleaning, laundry, driving, shopping, community activity, occupation, yard work, and exercise   PERSONAL FACTORS Age, Fitness, Time since onset of injury/illness/exacerbation, and 2+ comorbidity:    are also affecting patient's functional outcome.    REHAB POTENTIAL: Good   CLINICAL DECISION MAKING: unstable/complicated   EVALUATION COMPLEXITY: Moderate     GOALS:     SHORT TERM GOALS: Target date: 06/07/2022   Pt will become independent with HEP in order to demonstrate synthesis of PT education..   Goal status: MET 3/13   2.  Pt will score at least 12 pt increase on FOTO to demonstrate functional improvement in MCII and pt perceived function.      Goal status: MET   3.  Pt will be able to demonstrate/report ability to sit/stand/sleep for extended periods of time without pain in order to demonstrate functional improvement and tolerance to static positioning.    Goal status: MET     LONG TERM GOALS: Target date: 07/19/2022    Pt  will become independent with final HEP in order to demonstrate synthesis of PT education.   Goal status: MET   2.  Pt will score >/= 52 on FOTO to demonstrate improvement in perceived bilat knee function.    Goal status: MET   3.  Pt will be able to demonstrate squat to at least parallel in order to demonstrate functional improvement in LE function for self-care and house hold duties.    Goal status: ongoing   4.  Pt will be able to demonstrate kneeling to stand and stand to kneeling transfer without pain in order to demonstrate functional improvement in LE function for ADL/house hold duties.    Goal status: ongoing       PLAN: PT FREQUENCY: 1-2x/week   PT DURATION: 12 weeks (likely DC in 8)   PLANNED INTERVENTIONS: Therapeutic exercises, Therapeutic activity, Neuromuscular re-education, Balance training, Gait training, Patient/Family education, Self Care, and Joint mobilization   PLAN FOR  NEXT SESSION: continue with L quad and hip strength, knee joint TKE mobs  Daleen Bo, PT 06/07/2022, 1:02 PM

## 2022-06-08 ENCOUNTER — Other Ambulatory Visit: Payer: Self-pay | Admitting: *Deleted

## 2022-06-08 MED ORDER — TRAMADOL HCL 50 MG PO TABS: ORAL_TABLET | ORAL | 2 refills | Status: DC

## 2022-06-08 NOTE — Telephone Encounter (Signed)
Pt last seen on 02/10/22 Follow up scheduled on 08/18/22 Last filled on 06/08/22 #120 (30 day supply)  pharmacy attached note that if Rx refilled today it will be placed on file. Rx pending to be sighed

## 2022-06-14 ENCOUNTER — Other Ambulatory Visit: Payer: Self-pay | Admitting: Neurology

## 2022-06-14 ENCOUNTER — Other Ambulatory Visit: Payer: Self-pay | Admitting: Family Medicine

## 2022-06-15 ENCOUNTER — Encounter (HOSPITAL_BASED_OUTPATIENT_CLINIC_OR_DEPARTMENT_OTHER): Payer: Self-pay

## 2022-06-15 ENCOUNTER — Ambulatory Visit (HOSPITAL_BASED_OUTPATIENT_CLINIC_OR_DEPARTMENT_OTHER): Payer: Medicare Other

## 2022-06-15 DIAGNOSIS — R262 Difficulty in walking, not elsewhere classified: Secondary | ICD-10-CM

## 2022-06-15 DIAGNOSIS — M25562 Pain in left knee: Secondary | ICD-10-CM

## 2022-06-15 DIAGNOSIS — M25662 Stiffness of left knee, not elsewhere classified: Secondary | ICD-10-CM | POA: Diagnosis not present

## 2022-06-15 DIAGNOSIS — M6281 Muscle weakness (generalized): Secondary | ICD-10-CM | POA: Diagnosis not present

## 2022-06-15 MED ORDER — CLONAZEPAM 1 MG PO TABS: 1.0000 mg | ORAL_TABLET | Freq: Three times a day (TID) | ORAL | 0 refills | Status: DC | PRN

## 2022-06-15 MED ORDER — AMPHETAMINE-DEXTROAMPHETAMINE 20 MG PO TABS: 20.0000 mg | ORAL_TABLET | Freq: Two times a day (BID) | ORAL | 0 refills | Status: DC

## 2022-06-15 NOTE — Telephone Encounter (Signed)
Last seen on 02/10/22 Follow up scheduled on 08/18/22 Last filled on 05/14/22 # 90 tablets ( 30 day supply) Rx pending for you to sign

## 2022-06-15 NOTE — Telephone Encounter (Signed)
Last seen on 02/10/22 Follow up scheduled on 08/18/22 (Dr.Sater) Last filled on 05/19/22 #60 tablets (30 day supply) Rx pending to be signed

## 2022-06-15 NOTE — Therapy (Signed)
OUTPATIENT PHYSICAL THERAPY LOWER EXTREMITY TREATMENT  Progress Note  Reporting Period 2/19 to 06/02/22  See note below for Objective Data and Assessment of Progress/Goals.       Patient Name: Sheri Burns MRN: [id number] DOB:08/06/1955, 67 y.o., female Today's Date: 06/15/2022  END OF SESSION:  PT End of Session - 06/15/22 0841     Visit Number 12    Number of Visits 16    Authorization Type BCBS MCR    PT Start Time 0843    PT Stop Time 0923    PT Time Calculation (min) 40 min    Activity Tolerance Patient tolerated treatment well    Behavior During Therapy [facility] for tasks assessed/performed                   Past Medical History:  Diagnosis Date   Anxiety    Bulging lumbar disc 1980   Chronic pansinusitis    Colon polyp    Depression    GERD (gastroesophageal reflux disease)    Gynecological examination    sees Dr. Meredeth Ide    [id number](832.5)    Hypertension    Multiple sclerosis since 1992   Dr. Despina Arias in [facility]   Multiple thyroid nodules 1995   per pt. resolved on its own follwed by endo then released   Neurogenic bladder disorder 12/2009   w/ [facility] dx. Dr. McDiarmid   Neuromuscular disorder    Multiple sclerosis   Neuropathy    Osteoarthritis    Osteoporosis    last DEXA 2018   Overactive bladder    sees Dr. Lorin Picket McDiarmid    Pancreatitis 1977   Pneumonia    Ulcerative proctitis    Vision abnormalities    Past Surgical History:  Procedure Laterality Date   APPENDECTOMY     breast ruptured  left   ductal cyst   CARPAL TUNNEL RELEASE Right    CHOLECYSTECTOMY     COLONOSCOPY  01/10/09   repeat in 5 yrs Dr. Charlott Rakes   CYSTOSCOPY WITH BIOPSY N/A 01/06/2022   Procedure: CYSTOSCOPY WITH BLADDER/URETHRAL BIOPSY;  Surgeon: Sebastian Ache, MD;  Location: [facility];  Service: Urology;  Laterality: N/A;  45 MINS   DILATION AND CURETTAGE OF UTERUS     LAPAROSCOPY     x 3   left knee surgery  1978    to remove bone tumor     NASAL SINUS SURGERY Bilateral 01/13/2015   Procedure: ENDOSCOPIC SINUS SURGERY;  Surgeon: Serena Colonel, MD;  Location: [facility];  Service: ENT;  Laterality: Bilateral;   NASAL SINUS SURGERY  05/2017   NASAL TURBINATE REDUCTION     pubo vaginal sling  10/2003   TONSILLECTOMY     vaginal cystocele repair     anterior   VAGINAL HYSTERECTOMY  1995   AUB & Endometriosis   widom teeth removal     Patient Active Problem List   Diagnosis Date Noted   Vertigo 08/05/2021   Chronic prescription opiate use 12/14/2018   Carpal tunnel syndrome, bilateral 09/13/2017   Sacroiliac joint pain 09/13/2017   Urinary urgency 06/22/2017   Sciatica, right side 06/17/2016   Dysesthesia 10/30/2015   Right carpal tunnel syndrome 10/30/2015   Sciatica of left side 07/16/2015   Chronic ethmoidal sinusitis 06/26/2015   High risk medication use 07/08/2014   Chronic fatigue 04/02/2014   Dysphagia 04/02/2014   Ataxic gait 04/02/2014   Vitamin D deficiency 06/08/2012  OVERACTIVE BLADDER 08/01/2009   Osteoporosis 08/01/2009   Depression with anxiety 07/04/2007   MULTIPLE SCLEROSIS 07/04/2007   OA (osteoarthritis) of knee 07/04/2007   HEADACHE 07/04/2007   COLONIC POLYPS, HX OF 07/04/2007    PCP: Thana Ates, MD   REFERRING PROVIDER:   Myra Rude, MD    REFERRING DIAG:  [phone] (ICD-10-CM) - Primary osteoarthritis of left knee      THERAPY DIAG:  Pain in joint of left knee  Muscle weakness (generalized)  Difficulty walking  Stiffness of left knee, not elsewhere classified  Rationale for Evaluation and Treatment: Rehabilitation  ONSET DATE: Jan 2024  SUBJECTIVE:   SUBJECTIVE STATEMENT:  Pt states she has been alternating her walking days as instructed by PT. She reports some achiness today due to the weather.   Eval:  Pt reports that starting in January it started to bother her more. It has been hurting for quite some time with  history of surgery. She has had 2x injections from Dr. Jordan Likes. There is roughly a 25% improvement so far. Even sitting started to bother the knee. Pt states that just walking hurts the knee. Pt states that palpation to the knee and cyst does recreate. Pt states she is fairly sedentary but she does her errands with pain. Her MS effects her functional mobility and endurance. Pt does not enjoy the pool/aquatic therapy. Pt states that stairs bother the knee. Pt states that she can walk about 10-15 mins of walking before needing to sit. Pt feels like the knee buckles weakly.     PERTINENT HISTORY: 2/23 fusion of T7-S1 for scoliosis; L knee surgery medial condyle excision for osteochondritis desiccans; L meniscus tear; MS relapsing remitting  PAIN:  Are you having pain? Yes: NPRS scale: 2/10 Pain location: behind the knee the L knee, and lateral inferior patellar pole  Pain description: sharp, aching Aggravating factors: bending the knee, putting weight through the knee Relieving factors: resting, sitting   PRECAUTIONS: None  WEIGHT BEARING RESTRICTIONS: No  FALLS:  Has patient fallen in last 6 months? No  LIVING ENVIRONMENT: Lives with: lives alone Lives in: House/apartment Stairs: No Has following equipment at home: [facility] and walker, does not use either   OCCUPATION: ICU nurse, retired   PLOF: Independent with basic ADLs  PATIENT GOALS: Pt would like to avoid knee surgery.    OBJECTIVE:   FOTO 3/15 47  FOTO 3/27 57 (exceeded expectations)   LOWER EXTREMITY ROM:   Active ROM Right eval Left eval L 3/27  Hip flexion 115 115   Knee flexion 130 115 121  Knee extension 2 -10 -2   (Blank rows = not tested)   LOWER EXTREMITY MMT:   MMT Right eval Left eval L 3/27  Hip flexion       Hip extension       Hip abduction 21.4 19.7 31.9  Hip adduction       Hip internal rotation       Hip external rotation       Knee flexion 17.8 12.6 38.7  Knee extension 19.2 15.2 32.1    (Blank rows = not tested)     FUNCTIONAL TESTS:  Timed up and go (TUG): <10s   DIAGNOSTIC FINDINGS:     IMPRESSION: 1. No acute findings. 2. Moderate tricompartmental osteoarthritic change most prominent over the patellofemoral joint. 3. 2.5 cm soft tissue calcification over the popliteal fossa possibly within a complicated Baker cyst.  PATIENT SURVEYS:  FOTO 44 FOTO  47 3/15 52 @ D/C 12 pts MCII   TODAY'S TREATMENT:                                                                                                                              DATE:   4/9 L knee Tib/fem grade II-III TKE mob L knee PROM STM posterior knee  Supine SLR 2 lb 3x10L Long sitting  HSS 30secx3L Cannot bridge due to back*  Knee extension machine 2x10 10lbs  HS curl machine 25lbs 3x10 Lunge holds 3 second hold x10-fwd  Standing hip ABD GTB 3x10 8" box step up 3x6  Shuttle leg press- staggered 75lbs 2x10 (supine-the back rest bothered her back)    4/1 L knee Tib/fem grade IV TKE mob  STM L HS Supine HS stretch 10s 10x     Frequency, volume, intensity modifications; walking program increase, self recovery and massage   3/27  L knee Tib/fem grade IV TKE mob  *Cannot bridge due to back*  Knee extension machine up 2 down1 3x6 10lbs  HS curl machine 25lbs 2x10 Lunge holds 10s 3x each  Standing hip ABD GTB 2x10 8" box step up 4x6    3/20 L knee Tib/fem grade IV TKE mob L knee PROM  Supine SLR 2 lb 3x10L Cannot bridge due to back* Shuttle leg press- staggered 50lb 10x; 2x10 75lbs  Long sitting  HSS 30secx3 ea Mini-squat from bar 2x10 GTB at knees  HR/TR 2x10- staggered with L in rear  4" box heel tap (offload with R) 3x6 Tandem balance 30s 3x each  3/15 L knee Tib/fem grade III mobs ant/posterior glide L knee PROM  Supine SLR 1lb 3x10L Cannot bridge due to back* Sidelying hip abduction 3x10L LAQ 3# 2x10ea Long sitting  HSS 30secx3 ea STS from plinth- lower than  usual-2x10 GTB at knees  HR/TR 3x10 GTB at knees sidestepping 1235ft 3x laps  Standing hip abduction 2# 3x10 bil Step ups 4" 1x10L Partial  lunge 1x10L in front Tandem balance 20"ea 3/13 L knee Tib/fem grade III mobs ant/posterior glide L knee PROM  Supine SLR 1lb 3x10L Hold on bridges Sidelying hip abduction 3x10L LAQ 3# 2x10 Long sitting  HSS 30secx3 ea STS from high table 3x8 GTB at knees  HR/TR 3x10 GTB at knees sidestepping 2135ft 3x laps  Standing hip abduction 1# 3x10 bil   PATIENT EDUCATION:  Education details: anatomy, exercise progression, DOMS expectations, muscle firing,  envelope of function, HEP, POC  Person educated: Patient Education method: Explanation, Demonstration, Tactile cues, Verbal cues, and Handouts Education comprehension: verbalized understanding, returned demonstration, verbal cues required, and tactile cues required  HOME EXERCISE PROGRAM:  Access Code: [email or web address] URL: [email or web address] Date: 04/26/2022 Prepared by: Zebedee IbaAlan Zhou  ASSESSMENT:  CLINICAL IMPRESSION: Significant tightness noted in posterior knee today resulting in TKE deficit. Spent time on STM to this area and TKE mobilizations which improved her available extension. Good tolerance for LE strengthening without c/o pain.  She did note some back discomfort with shuttle press today. Will hold on this next time.   OBJECTIVE IMPAIRMENTS decreased mobility, difficulty walking, decreased ROM, decreased strength, increased muscle spasms, improper body mechanics, postural dysfunction, and pain.    ACTIVITY LIMITATIONS lifting, sitting, standing, squatting, stairs, transfers, locomotion level   PARTICIPATION LIMITATIONS: cleaning, laundry, driving, shopping, community activity, occupation, yard work, and exercise   PERSONAL FACTORS Age, Fitness, Time since onset of injury/illness/exacerbation, and 2+ comorbidity:    are also affecting patient's functional outcome.    REHAB  POTENTIAL: Good   CLINICAL DECISION MAKING: unstable/complicated   EVALUATION COMPLEXITY: Moderate     GOALS:     SHORT TERM GOALS: Target date: 06/07/2022   Pt will become independent with HEP in order to demonstrate synthesis of PT education..   Goal status: MET 3/13   2.  Pt will score at least 12 pt increase on FOTO to demonstrate functional improvement in MCII and pt perceived function.      Goal status: MET   3.  Pt will be able to demonstrate/report ability to sit/stand/sleep for extended periods of time without pain in order to demonstrate functional improvement and tolerance to static positioning.    Goal status: MET     LONG TERM GOALS: Target date: 07/19/2022    Pt  will become independent with final HEP in order to demonstrate synthesis of PT education.   Goal status: MET   2.  Pt will score >/= 52 on FOTO to demonstrate improvement in perceived bilat knee function.    Goal status: MET   3.  Pt will be able to demonstrate squat to at least parallel in order to demonstrate functional improvement in LE function for self-care and house hold duties.    Goal status: ongoing   4.  Pt will be able to demonstrate kneeling to stand and stand to kneeling transfer without pain in order to demonstrate functional improvement in LE function for ADL/house hold duties.    Goal status: ongoing       PLAN: PT FREQUENCY: 1-2x/week   PT DURATION: 12 weeks (likely DC in 8)   PLANNED INTERVENTIONS: Therapeutic exercises, Therapeutic activity, Neuromuscular re-education, Balance training, Gait training, Patient/Family education, Self Care, and Joint mobilization   PLAN FOR NEXT SESSION: continue with L quad and hip strength, knee joint TKE mobs  Donnel Saxon Amarah Brossman, PTA 06/15/2022, 9:59 AM

## 2022-06-18 ENCOUNTER — Encounter: Payer: Self-pay | Admitting: Family Medicine

## 2022-06-21 ENCOUNTER — Encounter: Payer: Self-pay | Admitting: *Deleted

## 2022-06-23 DIAGNOSIS — K080 Exfoliation of teeth due to systemic causes: Secondary | ICD-10-CM | POA: Diagnosis not present

## 2022-06-24 ENCOUNTER — Encounter (HOSPITAL_BASED_OUTPATIENT_CLINIC_OR_DEPARTMENT_OTHER): Payer: Self-pay

## 2022-06-24 ENCOUNTER — Ambulatory Visit (HOSPITAL_BASED_OUTPATIENT_CLINIC_OR_DEPARTMENT_OTHER): Payer: Medicare Other

## 2022-06-24 DIAGNOSIS — R262 Difficulty in walking, not elsewhere classified: Secondary | ICD-10-CM

## 2022-06-24 DIAGNOSIS — M6281 Muscle weakness (generalized): Secondary | ICD-10-CM

## 2022-06-24 DIAGNOSIS — M25662 Stiffness of left knee, not elsewhere classified: Secondary | ICD-10-CM

## 2022-06-24 DIAGNOSIS — M25562 Pain in left knee: Secondary | ICD-10-CM | POA: Diagnosis not present

## 2022-06-24 NOTE — Therapy (Signed)
OUTPATIENT PHYSICAL THERAPY LOWER EXTREMITY TREATMENT   Patient Name: Sheri Burns [place] MRN: [id number] DOB:[birth year], 67 y.o., female Today's Date: 06/24/2022  END OF SESSION:  PT End of Session - 06/24/22 1056     Visit Number 13    Number of Visits 16    Authorization Type BCBS MCR    PT Start Time 1102    PT Stop Time 1146    PT Time Calculation (min) 44 min    Activity Tolerance Patient tolerated treatment well    Behavior During Therapy WFL for tasks assessed/performed                   Past Medical History:  Diagnosis Date   Anxiety    Bulging lumbar disc 1980   Chronic pansinusitis    Colon polyp    Depression    GERD (gastroesophageal reflux disease)    Gynecological examination    sees Dr. Meredeth Ide    [id number](993.7)    Hypertension    Multiple sclerosis since 1992   Dr. Despina Arias in [facility]   Multiple thyroid nodules 1995   per pt. resolved on its own follwed by endo then released   Neurogenic bladder disorder 12/2009   w/ [facility] dx. Dr. McDiarmid   Neuromuscular disorder    Multiple sclerosis   Neuropathy    Osteoarthritis    Osteoporosis    last DEXA 2018   Overactive bladder    sees Dr. Lorin Picket McDiarmid    Pancreatitis 1977   Pneumonia    Ulcerative proctitis    Vision abnormalities    Past Surgical History:  Procedure Laterality Date   APPENDECTOMY     breast ruptured  left   ductal cyst   CARPAL TUNNEL RELEASE Right    CHOLECYSTECTOMY     COLONOSCOPY  01/10/09   repeat in 5 yrs Dr. Charlott Rakes   CYSTOSCOPY WITH BIOPSY N/A 01/06/2022   Procedure: CYSTOSCOPY WITH BLADDER/URETHRAL BIOPSY;  Surgeon: Sebastian Ache, MD;  Location: [facility];  Service: Urology;  Laterality: N/A;  45 MINS   DILATION AND CURETTAGE OF UTERUS     LAPAROSCOPY     x 3   left knee surgery  1978   to remove bone tumor     NASAL SINUS SURGERY Bilateral 01/13/2015   Procedure: ENDOSCOPIC SINUS SURGERY;  Surgeon: Serena Colonel,  MD;  Location: [facility];  Service: ENT;  Laterality: Bilateral;   NASAL SINUS SURGERY  05/2017   NASAL TURBINATE REDUCTION     pubo vaginal sling  10/2003   TONSILLECTOMY     vaginal cystocele repair     anterior   VAGINAL HYSTERECTOMY  1995   AUB & Endometriosis   widom teeth removal     Patient Active Problem List   Diagnosis Date Noted   Vertigo 08/05/2021   Chronic prescription opiate use 12/14/2018   Carpal tunnel syndrome, bilateral 09/13/2017   Sacroiliac joint pain 09/13/2017   Urinary urgency 06/22/2017   Sciatica, right side 06/17/2016   Dysesthesia 10/30/2015   Right carpal tunnel syndrome 10/30/2015   Sciatica of left side 07/16/2015   Chronic ethmoidal sinusitis 06/26/2015   High risk medication use 07/08/2014   Chronic fatigue 04/02/2014   Dysphagia 04/02/2014   Ataxic gait 04/02/2014   Vitamin D deficiency 06/08/2012   OVERACTIVE BLADDER 08/01/2009   Osteoporosis 08/01/2009   Depression with anxiety 07/04/2007   MULTIPLE SCLEROSIS 07/04/2007   OA (osteoarthritis) of knee  07/04/2007   HEADACHE 07/04/2007   COLONIC POLYPS, HX OF 07/04/2007    PCP: Thana Ates, MD   REFERRING PROVIDER:   Myra Rude, MD    REFERRING DIAG:  [phone] (ICD-10-CM) - Primary osteoarthritis of left knee      THERAPY DIAG:  Pain in joint of left knee  Muscle weakness (generalized)  Difficulty walking  Stiffness of left knee, not elsewhere classified  Rationale for Evaluation and Treatment: Rehabilitation  ONSET DATE: Jan 2024  SUBJECTIVE:   SUBJECTIVE STATEMENT:  Pt reports she was walking on Friday and had a sudden pain in L knee. This got worse after getting up out of a chair later in the day. "It hurt so bad I could barely walk." She messaged MD who offered to rx pain meds, but pt declined. She reports 3/10 pain level at entry. "I feel like I'm back where I started." Sees MD on 4/24.  Eval:  Pt reports that starting in January it  started to bother her more. It has been hurting for quite some time with history of surgery. She has had 2x injections from Dr. Jordan Likes. There is roughly a 25% improvement so far. Even sitting started to bother the knee. Pt states that just walking hurts the knee. Pt states that palpation to the knee and cyst does recreate. Pt states she is fairly sedentary but she does her errands with pain. Her MS effects her functional mobility and endurance. Pt does not enjoy the pool/aquatic therapy. Pt states that stairs bother the knee. Pt states that she can walk about 10-15 mins of walking before needing to sit. Pt feels like the knee buckles weakly.     PERTINENT HISTORY: 2/23 fusion of T7-S1 for scoliosis; L knee surgery medial condyle excision for osteochondritis desiccans; L meniscus tear; MS relapsing remitting  PAIN:  Are you having pain? Yes: NPRS scale: 3/10 Pain location: behind the knee the L knee, and lateral inferior patellar pole  Pain description: sharp, aching Aggravating factors: bending the knee, putting weight through the knee Relieving factors: resting, sitting   PRECAUTIONS: None  WEIGHT BEARING RESTRICTIONS: No  FALLS:  Has patient fallen in last 6 months? No  LIVING ENVIRONMENT: Lives with: lives alone Lives in: House/apartment Stairs: No Has following equipment at home: [facility] and walker, does not use either   OCCUPATION: ICU nurse, retired   PLOF: Independent with basic ADLs  PATIENT GOALS: Pt would like to avoid knee surgery.    OBJECTIVE:   FOTO 3/15 47  FOTO 3/27 57 (exceeded expectations)   LOWER EXTREMITY ROM:   Active ROM Right eval Left eval L 3/27  Hip flexion 115 115   Knee flexion 130 115 121  Knee extension 2 -10 -2   (Blank rows = not tested)   LOWER EXTREMITY MMT:   MMT Right eval Left eval L 3/27  Hip flexion       Hip extension       Hip abduction 21.4 19.7 31.9  Hip adduction       Hip internal rotation       Hip external  rotation       Knee flexion 17.8 12.6 38.7  Knee extension 19.2 15.2 32.1   (Blank rows = not tested)     FUNCTIONAL TESTS:  Timed up and go (TUG): <10s   DIAGNOSTIC FINDINGS:     IMPRESSION: 1. No acute findings. 2. Moderate tricompartmental osteoarthritic change most prominent over the patellofemoral joint. 3. 2.5  cm soft tissue calcification over the popliteal fossa possibly within a complicated Baker cyst.  PATIENT SURVEYS:  FOTO 44 FOTO 47 3/15 52 @ D/C 12 pts MCII   TODAY'S TREATMENT:                                                                                                                              DATE:   4/18 L knee Tib/fem grade II-III TKE mob L knee PROM STM posterior knee and distal ITB  Supine SLR 2 lb 3x10L LAQ-2x10 Long sitting  HSS 30secx3L Cannot bridge due to back*  Standing hip ABD GTB 3x10 8" box step up 3x6  4/9 L knee Tib/fem grade II-III TKE mob L knee PROM STM posterior knee  Supine SLR 2 lb 3x10L Long sitting  HSS 30secx3L Cannot bridge due to back*  Knee extension machine 2x10 10lbs  HS curl machine 25lbs 3x10 Lunge holds 3 second hold x10-fwd  Standing hip ABD GTB 3x10 8" box step up 3x6  Shuttle leg press- staggered 75lbs 2x10 (supine-the back rest bothered her back)    4/1 L knee Tib/fem grade IV TKE mob  STM L HS Supine HS stretch 10s 10x     Frequency, volume, intensity modifications; walking program increase, self recovery and massage    PATIENT EDUCATION:  Education details: anatomy, exercise progression, DOMS expectations, muscle firing,  envelope of function, HEP, POC  Person educated: Patient Education method: Explanation, Demonstration, Tactile cues, Verbal cues, and Handouts Education comprehension: verbalized understanding, returned demonstration, verbal cues required, and tactile cues required  HOME EXERCISE PROGRAM:  Access Code: [email or web address] URL: [email or web address] Date:  04/26/2022 Prepared by: Zebedee Iba  ASSESSMENT:  CLINICAL IMPRESSION: Regressions in exercise program today due to recent flare up. Remained within pain limits with exercises. Focused on manual intervention today with tenderness noted in distal ITB And posterior knee. Educated pt about activity modification and using ice prn.   OBJECTIVE IMPAIRMENTS decreased mobility, difficulty walking, decreased ROM, decreased strength, increased muscle spasms, improper body mechanics, postural dysfunction, and pain.    ACTIVITY LIMITATIONS lifting, sitting, standing, squatting, stairs, transfers, locomotion level   PARTICIPATION LIMITATIONS: cleaning, laundry, driving, shopping, community activity, occupation, yard work, and exercise   PERSONAL FACTORS Age, Fitness, Time since onset of injury/illness/exacerbation, and 2+ comorbidity:    are also affecting patient's functional outcome.    REHAB POTENTIAL: Good   CLINICAL DECISION MAKING: unstable/complicated   EVALUATION COMPLEXITY: Moderate     GOALS:     SHORT TERM GOALS: Target date: 06/07/2022   Pt will become independent with HEP in order to demonstrate synthesis of PT education..   Goal status: MET 3/13   2.  Pt will score at least 12 pt increase on FOTO to demonstrate functional improvement in MCII and pt perceived function.      Goal status: MET   3.  Pt will be able to demonstrate/report ability to sit/stand/sleep for extended periods of time  without pain in order to demonstrate functional improvement and tolerance to static positioning.    Goal status: MET     LONG TERM GOALS: Target date: 07/19/2022    Pt  will become independent with final HEP in order to demonstrate synthesis of PT education.   Goal status: MET   2.  Pt will score >/= 52 on FOTO to demonstrate improvement in perceived bilat knee function.    Goal status: MET   3.  Pt will be able to demonstrate squat to at least parallel in order to demonstrate  functional improvement in LE function for self-care and house hold duties.    Goal status: ongoing   4.  Pt will be able to demonstrate kneeling to stand and stand to kneeling transfer without pain in order to demonstrate functional improvement in LE function for ADL/house hold duties.    Goal status: ongoing       PLAN: PT FREQUENCY: 1-2x/week   PT DURATION: 12 weeks (likely DC in 8)   PLANNED INTERVENTIONS: Therapeutic exercises, Therapeutic activity, Neuromuscular re-education, Balance training, Gait training, Patient/Family education, Self Care, and Joint mobilization   PLAN FOR NEXT SESSION: continue with L quad and hip strength, knee joint TKE mobs  Donnel Saxon Kaleen Rochette, PTA 06/24/2022, 12:05 PM

## 2022-06-30 ENCOUNTER — Ambulatory Visit: Payer: Medicare Other | Admitting: Family Medicine

## 2022-06-30 ENCOUNTER — Encounter (HOSPITAL_BASED_OUTPATIENT_CLINIC_OR_DEPARTMENT_OTHER): Payer: Self-pay | Admitting: Physical Therapy

## 2022-06-30 ENCOUNTER — Ambulatory Visit (HOSPITAL_BASED_OUTPATIENT_CLINIC_OR_DEPARTMENT_OTHER): Payer: Medicare Other | Admitting: Physical Therapy

## 2022-06-30 ENCOUNTER — Encounter: Payer: Self-pay | Admitting: Family Medicine

## 2022-06-30 VITALS — BP 130/74 | Ht 65.0 in | Wt 130.0 lb

## 2022-06-30 DIAGNOSIS — M6281 Muscle weakness (generalized): Secondary | ICD-10-CM

## 2022-06-30 DIAGNOSIS — M1712 Unilateral primary osteoarthritis, left knee: Secondary | ICD-10-CM

## 2022-06-30 DIAGNOSIS — R262 Difficulty in walking, not elsewhere classified: Secondary | ICD-10-CM | POA: Diagnosis not present

## 2022-06-30 DIAGNOSIS — M25662 Stiffness of left knee, not elsewhere classified: Secondary | ICD-10-CM

## 2022-06-30 DIAGNOSIS — M25562 Pain in left knee: Secondary | ICD-10-CM

## 2022-06-30 NOTE — Progress Notes (Signed)
  Sheri Burns - 67 y.o. female MRN [id number]  Date of birth: 06-13-1955  SUBJECTIVE:  Including CC & ROS.  No chief complaint on file.   Sheri Burns is a 67 y.o. female that is following up for her left knee pain.  She has been doing well since the nerve block and having success with physical therapy.  She denies having any significant pain.  She rates her pain at a 1 or 2.    Review of Systems See HPI   HISTORY: Past Medical, Surgical, Social, and Family History Reviewed & Updated per EMR.   Pertinent Historical Findings include:  Past Medical History:  Diagnosis Date   Anxiety    Bulging lumbar disc 1980   Chronic pansinusitis    Colon polyp    Depression    GERD (gastroesophageal reflux disease)    Gynecological examination    sees Dr. Meredeth Ide    WUJWJXBJ(478.2)    Hypertension    Multiple sclerosis since 1992   Dr. Despina Arias in [facility]   Multiple thyroid nodules 1995   per pt. resolved on its own follwed by endo then released   Neurogenic bladder disorder 12/2009   w/ urge & stress inc. dx. Dr. McDiarmid   Neuromuscular disorder    Multiple sclerosis   Neuropathy    Osteoarthritis    Osteoporosis    last DEXA 2018   Overactive bladder    sees Dr. Lorin Picket McDiarmid    Pancreatitis 1977   Pneumonia    Ulcerative proctitis    Vision abnormalities     Past Surgical History:  Procedure Laterality Date   APPENDECTOMY     breast ruptured  left   ductal cyst   CARPAL TUNNEL RELEASE Right    CHOLECYSTECTOMY     COLONOSCOPY  01/10/09   repeat in 5 yrs Dr. Charlott Rakes   CYSTOSCOPY WITH BIOPSY N/A 01/06/2022   Procedure: CYSTOSCOPY WITH BLADDER/URETHRAL BIOPSY;  Surgeon: Sebastian Ache, MD;  Location: [facility];  Service: Urology;  Laterality: N/A;  45 MINS   DILATION AND CURETTAGE OF UTERUS     LAPAROSCOPY     x 3   left knee surgery  1978   to remove bone tumor     NASAL SINUS SURGERY Bilateral 01/13/2015   Procedure:  ENDOSCOPIC SINUS SURGERY;  Surgeon: Serena Colonel, MD;  Location: [facility];  Service: ENT;  Laterality: Bilateral;   NASAL SINUS SURGERY  05/2017   NASAL TURBINATE REDUCTION     pubo vaginal sling  10/2003   TONSILLECTOMY     vaginal cystocele repair     anterior   VAGINAL HYSTERECTOMY  1995   AUB & Endometriosis   widom teeth removal       PHYSICAL EXAM:  VS: BP 130/74 (BP Location: Left Arm, Patient Position: Sitting)   Ht  (1.651 m)   Wt 130 lb (59 kg)   LMP 07/06/1993   BMI 21.63 kg/m  Physical Exam Gen: NAD, alert, cooperative with exam, well-appearing MSK:  Neurovascularly intact       ASSESSMENT & PLAN:   OA (osteoarthritis) of knee Doing well with conservative measures up to this point.  Her pain is minimal. -Counseled on home exercise therapy and supportive care. - Continue physical therapy. -Could consider Zilretta injection or nerve ablation

## 2022-06-30 NOTE — Assessment & Plan Note (Signed)
Doing well with conservative measures up to this point.  Her pain is minimal. -Counseled on home exercise therapy and supportive care. - Continue physical therapy. -Could consider Zilretta injection or nerve ablation

## 2022-06-30 NOTE — Therapy (Signed)
OUTPATIENT PHYSICAL THERAPY LOWER EXTREMITY TREATMENT   Patient Name: Sheri Burns MRN: [id number] DOB:[birth year], 67 y.o., female Today's Date: 06/30/2022  END OF SESSION:  PT End of Session - 06/30/22 0923     Visit Number 14    Number of Visits 16    Authorization Type BCBS MCR    PT Start Time 0930    PT Stop Time 1010    PT Time Calculation (min) 40 min    Activity Tolerance Patient tolerated treatment well    Behavior During Therapy WFL for tasks assessed/performed                    Past Medical History:  Diagnosis Date   Anxiety    Bulging lumbar disc 1980   Chronic pansinusitis    Colon polyp    Depression    GERD (gastroesophageal reflux disease)    Gynecological examination    sees Dr. Meredeth Ide    WUJWJXBJ(478.2)    Hypertension    Multiple sclerosis since 1992   Dr. Despina Arias in [facility]   Multiple thyroid nodules 1995   per pt. resolved on its own follwed by endo then released   Neurogenic bladder disorder 12/2009   w/ urge & stress inc. dx. Dr. McDiarmid   Neuromuscular disorder    Multiple sclerosis   Neuropathy    Osteoarthritis    Osteoporosis    last DEXA 2018   Overactive bladder    sees Dr. Lorin Picket McDiarmid    Pancreatitis 1977   Pneumonia    Ulcerative proctitis    Vision abnormalities    Past Surgical History:  Procedure Laterality Date   APPENDECTOMY     breast ruptured  left   ductal cyst   CARPAL TUNNEL RELEASE Right    CHOLECYSTECTOMY     COLONOSCOPY  01/10/09   repeat in 5 yrs Dr. Charlott Rakes   CYSTOSCOPY WITH BIOPSY N/A 01/06/2022   Procedure: CYSTOSCOPY WITH BLADDER/URETHRAL BIOPSY;  Surgeon: Sebastian Ache, MD;  Location: [facility];  Service: Urology;  Laterality: N/A;  45 MINS   DILATION AND CURETTAGE OF UTERUS     LAPAROSCOPY     x 3   left knee surgery  1978   to remove bone tumor     NASAL SINUS SURGERY Bilateral 01/13/2015   Procedure: ENDOSCOPIC SINUS SURGERY;  Surgeon: Serena Colonel, MD;  Location: [facility];  Service: ENT;  Laterality: Bilateral;   NASAL SINUS SURGERY  05/2017   NASAL TURBINATE REDUCTION     pubo vaginal sling  10/2003   TONSILLECTOMY     vaginal cystocele repair     anterior   VAGINAL HYSTERECTOMY  1995   AUB & Endometriosis   widom teeth removal     Patient Active Problem List   Diagnosis Date Noted   Vertigo 08/05/2021   Chronic prescription opiate use 12/14/2018   Carpal tunnel syndrome, bilateral 09/13/2017   Sacroiliac joint pain 09/13/2017   Urinary urgency 06/22/2017   Sciatica, right side 06/17/2016   Dysesthesia 10/30/2015   Right carpal tunnel syndrome 10/30/2015   Sciatica of left side 07/16/2015   Chronic ethmoidal sinusitis 06/26/2015   High risk medication use 07/08/2014   Chronic fatigue 04/02/2014   Dysphagia 04/02/2014   Ataxic gait 04/02/2014   Vitamin D deficiency 06/08/2012   OVERACTIVE BLADDER 08/01/2009   Osteoporosis 08/01/2009   Depression with anxiety 07/04/2007   MULTIPLE SCLEROSIS 07/04/2007   OA (osteoarthritis) of  knee 07/04/2007   HEADACHE 07/04/2007   COLONIC POLYPS, HX OF 07/04/2007    PCP: Sheri Ates, MD   REFERRING PROVIDER:   Myra Rude, MD    REFERRING DIAG:  [phone] (ICD-10-CM) - Primary osteoarthritis of left knee      THERAPY DIAG:  Pain in joint of left knee  Muscle weakness (generalized)  Stiffness of left knee, not elsewhere classified  Difficulty walking  Rationale for Evaluation and Treatment: Rehabilitation  ONSET DATE: Jan 2024  SUBJECTIVE:   SUBJECTIVE STATEMENT:  Pt states the L knee in general was painful after sitting for a while. She felt it was giving way. Pt started using Voltaren last Wed QID. Pt states it is much better after last session with Lurena Joiner. She has been walking and exercise 3x each weekly. FWB SLS hurts too much to perform.   Eval:  Pt reports that starting in January it started to bother her more. It has been  hurting for quite some time with history of surgery. She has had 2x injections from Dr. Jordan Likes. There is roughly a 25% improvement so far. Even sitting started to bother the knee. Pt states that just walking hurts the knee. Pt states that palpation to the knee and cyst does recreate. Pt states she is fairly sedentary but she does her errands with pain. Her MS effects her functional mobility and endurance. Pt does not enjoy the pool/aquatic therapy. Pt states that stairs bother the knee. Pt states that she can walk about 10-15 mins of walking before needing to sit. Pt feels like the knee buckles weakly.     PERTINENT HISTORY: 2/23 fusion of T7-S1 for scoliosis; L knee surgery medial condyle excision for osteochondritis desiccans; L meniscus tear; MS relapsing remitting  PAIN:  Are you having pain? No: NPRS scale: 0/10 Pain location: behind the knee the L knee, and lateral inferior patellar pole  Pain description: sharp, aching Aggravating factors: bending the knee, putting weight through the knee Relieving factors: resting, sitting   PRECAUTIONS: None  WEIGHT BEARING RESTRICTIONS: No  FALLS:  Has patient fallen in last 6 months? No  LIVING ENVIRONMENT: Lives with: lives alone Lives in: House/apartment Stairs: No Has following equipment at home: [facility] and walker, does not use either   OCCUPATION: ICU nurse, retired   PLOF: Independent with basic ADLs  PATIENT GOALS: Pt would like to avoid knee surgery.    OBJECTIVE:   FOTO 3/15 47  FOTO 3/27 57 (exceeded expectations)   LOWER EXTREMITY ROM:   Active ROM Right eval Left eval L 3/27  Hip flexion 115 115   Knee flexion 130 115 121  Knee extension 2 -10 -2   (Blank rows = not tested)   LOWER EXTREMITY MMT:   MMT Right eval Left eval L 3/27  Hip flexion       Hip extension       Hip abduction 21.4 19.7 31.9  Hip adduction       Hip internal rotation       Hip external rotation       Knee flexion 17.8 12.6 38.7   Knee extension 19.2 15.2 32.1   (Blank rows = not tested)     FUNCTIONAL TESTS:  Timed up and go (TUG): <10s   DIAGNOSTIC FINDINGS:     IMPRESSION: 1. No acute findings. 2. Moderate tricompartmental osteoarthritic change most prominent over the patellofemoral joint. 3. 2.5 cm soft tissue calcification over the popliteal fossa possibly within a complicated  Baker cyst.  PATIENT SURVEYS:  FOTO 44 FOTO 47 3/15 52 @ D/C 12 pts MCII   TODAY'S TREATMENT:                                                                                                                              DATE:   4/24 STM L quad- VL, rec fem, VM Active release of L quad    S/L quad stretch 30s 3x Standing quad stretch with chair   Activity modifications, exercise volume, self recovery technique  4/18 L knee Tib/fem grade II-III TKE mob L knee PROM STM posterior knee and distal ITB  Supine SLR 2 lb 3x10L LAQ-2x10 Long sitting  HSS 30secx3L Cannot bridge due to back*  Standing hip ABD GTB 3x10 8" box step up 3x6  4/9 L knee Tib/fem grade II-III TKE mob L knee PROM STM posterior knee  Supine SLR 2 lb 3x10L Long sitting  HSS 30secx3L Cannot bridge due to back*  Knee extension machine 2x10 10lbs  HS curl machine 25lbs 3x10 Lunge holds 3 second hold x10-fwd  Standing hip ABD GTB 3x10 8" box step up 3x6  Shuttle leg press- staggered 75lbs 2x10 (supine-the back rest bothered her back)    4/1 L knee Tib/fem grade IV TKE mob  STM L HS Supine HS stretch 10s 10x     Frequency, volume, intensity modifications; walking program increase, self recovery and massage    PATIENT EDUCATION:  Education details: anatomy, exercise progression, DOMS expectations, muscle firing,  envelope of function, HEP, POC  Person educated: Patient Education method: Explanation, Demonstration, Tactile cues, Verbal cues, and Handouts Education comprehension: verbalized understanding, returned demonstration,  verbal cues required, and tactile cues required  HOME EXERCISE PROGRAM:  Access Code: [email or web address] URL: [email or web address] Date: 04/26/2022 Prepared by: Zebedee Iba  ASSESSMENT:  CLINICAL IMPRESSION: Pt s/s appear consistent with excessive tension across the L knee extensor mechanism. Movie-goers sign and positive Ely's likely contributing to anterior knee pain. Pt with relief following manual and stretching. HEP updated. Plan for 1-2 more visits with pt to establish gym program as pt is planning to join [employer]. Progress towards personal training. Pt would benefit from continued skilled therapy in order to reach goals and maximize functional L LE strength and ROM for return to exercise and fitness.   OBJECTIVE IMPAIRMENTS decreased mobility, difficulty walking, decreased ROM, decreased strength, increased muscle spasms, improper body mechanics, postural dysfunction, and pain.    ACTIVITY LIMITATIONS lifting, sitting, standing, squatting, stairs, transfers, locomotion level   PARTICIPATION LIMITATIONS: cleaning, laundry, driving, shopping, community activity, occupation, yard work, and exercise   PERSONAL FACTORS Age, Fitness, Time since onset of injury/illness/exacerbation, and 2+ comorbidity:    are also affecting patient's functional outcome.    REHAB POTENTIAL: Good   CLINICAL DECISION MAKING: unstable/complicated   EVALUATION COMPLEXITY: Moderate     GOALS:     SHORT TERM GOALS: Target date: 06/07/2022   Pt will become independent  with HEP in order to demonstrate synthesis of PT education..   Goal status: MET 3/13   2.  Pt will score at least 12 pt increase on FOTO to demonstrate functional improvement in MCII and pt perceived function.      Goal status: MET   3.  Pt will be able to demonstrate/report ability to sit/stand/sleep for extended periods of time without pain in order to demonstrate functional improvement and tolerance to static positioning.     Goal status: MET     LONG TERM GOALS: Target date: 07/19/2022    Pt  will become independent with final HEP in order to demonstrate synthesis of PT education.   Goal status: MET   2.  Pt will score >/= 52 on FOTO to demonstrate improvement in perceived bilat knee function.    Goal status: MET   3.  Pt will be able to demonstrate squat to at least parallel in order to demonstrate functional improvement in LE function for self-care and house hold duties.    Goal status: ongoing   4.  Pt will be able to demonstrate kneeling to stand and stand to kneeling transfer without pain in order to demonstrate functional improvement in LE function for ADL/house hold duties.    Goal status: ongoing       PLAN: PT FREQUENCY: 1-2x/week   PT DURATION: 12 weeks (likely DC in 8)   PLANNED INTERVENTIONS: Therapeutic exercises, Therapeutic activity, Neuromuscular re-education, Balance training, Gait training, Patient/Family education, Self Care, and Joint mobilization   PLAN FOR NEXT SESSION: continue with L quad and hip strength, knee joint TKE mobs  Zebedee Iba, PT 06/30/2022, 10:19 AM

## 2022-07-05 ENCOUNTER — Encounter: Payer: Self-pay | Admitting: Family Medicine

## 2022-07-07 ENCOUNTER — Encounter (HOSPITAL_BASED_OUTPATIENT_CLINIC_OR_DEPARTMENT_OTHER): Payer: Self-pay | Admitting: Physical Therapy

## 2022-07-07 ENCOUNTER — Ambulatory Visit (HOSPITAL_BASED_OUTPATIENT_CLINIC_OR_DEPARTMENT_OTHER): Payer: Medicare Other | Attending: Family Medicine | Admitting: Physical Therapy

## 2022-07-07 DIAGNOSIS — R262 Difficulty in walking, not elsewhere classified: Secondary | ICD-10-CM | POA: Insufficient documentation

## 2022-07-07 DIAGNOSIS — M6281 Muscle weakness (generalized): Secondary | ICD-10-CM | POA: Diagnosis not present

## 2022-07-07 DIAGNOSIS — M25662 Stiffness of left knee, not elsewhere classified: Secondary | ICD-10-CM | POA: Insufficient documentation

## 2022-07-07 DIAGNOSIS — M25562 Pain in left knee: Secondary | ICD-10-CM

## 2022-07-07 NOTE — Therapy (Signed)
OUTPATIENT PHYSICAL THERAPY LOWER EXTREMITY TREATMENT   Patient Name: Sheri Burns MRN: [id number] DOB:[birth year], 67 y.o., female Today's Date: 07/07/2022  END OF SESSION:  PT End of Session - 07/07/22 0916     Visit Number 15    Number of Visits 16    Date for PT Re-Evaluation 07/25/22    Authorization Type [employer] MCR    PT Start Time 0930    PT Stop Time 1015    PT Time Calculation (min) 45 min    Activity Tolerance Patient tolerated treatment well    Behavior During Therapy WFL for tasks assessed/performed                    Past Medical History:  Diagnosis Date   Anxiety    Bulging lumbar disc 1980   Chronic pansinusitis    Colon polyp    Depression    GERD (gastroesophageal reflux disease)    Gynecological examination    sees Dr. Aram Beecham Romine    Headache(784.0)    Hypertension    Multiple sclerosis (HCC) since 1992   Dr. Despina Arias in [facility]   Multiple thyroid nodules 1995   per pt. resolved on its own follwed by endo then released   Neurogenic bladder disorder 12/2009   w/ [facility] dx. Dr. McDiarmid   Neuromuscular disorder [facility])    Multiple sclerosis   Neuropathy    Osteoarthritis    Osteoporosis    last DEXA 2018   Overactive bladder    sees Dr. Lorin Picket McDiarmid    Pancreatitis 1977   Pneumonia    Ulcerative proctitis (HCC)    Vision abnormalities    Past Surgical History:  Procedure Laterality Date   APPENDECTOMY     breast ruptured  left   ductal cyst   CARPAL TUNNEL RELEASE Right    CHOLECYSTECTOMY     COLONOSCOPY  01/10/09   repeat in 5 yrs Dr. Charlott Rakes   CYSTOSCOPY WITH BIOPSY N/A 01/06/2022   Procedure: CYSTOSCOPY WITH BLADDER/URETHRAL BIOPSY;  Surgeon: Sebastian Ache, MD;  Location: [facility];  Service: Urology;  Laterality: N/A;  45 MINS   DILATION AND CURETTAGE OF UTERUS     LAPAROSCOPY     x 3   left knee surgery  1978   to remove bone tumor     NASAL SINUS SURGERY Bilateral 01/13/2015    Procedure: ENDOSCOPIC SINUS SURGERY;  Surgeon: Serena Colonel, MD;  Location: [facility];  Service: ENT;  Laterality: Bilateral;   NASAL SINUS SURGERY  05/2017   NASAL TURBINATE REDUCTION     pubo vaginal sling  10/2003   TONSILLECTOMY     vaginal cystocele repair     anterior   VAGINAL HYSTERECTOMY  1995   AUB & Endometriosis   widom teeth removal     Patient Active Problem List   Diagnosis Date Noted   Vertigo 08/05/2021   Chronic prescription opiate use 12/14/2018   Carpal tunnel syndrome, bilateral 09/13/2017   Sacroiliac joint pain 09/13/2017   Urinary urgency 06/22/2017   Sciatica, right side 06/17/2016   Dysesthesia 10/30/2015   Right carpal tunnel syndrome 10/30/2015   Sciatica of left side 07/16/2015   Chronic ethmoidal sinusitis 06/26/2015   High risk medication use 07/08/2014   Chronic fatigue 04/02/2014   Dysphagia 04/02/2014   Ataxic gait 04/02/2014   Vitamin D deficiency 06/08/2012   OVERACTIVE BLADDER 08/01/2009   Osteoporosis 08/01/2009   Depression with anxiety  07/04/2007   MULTIPLE SCLEROSIS 07/04/2007   OA (osteoarthritis) of knee 07/04/2007   HEADACHE 07/04/2007   COLONIC POLYPS, HX OF 07/04/2007    PCP: Thana Ates, MD   REFERRING PROVIDER:   Myra Rude, MD    REFERRING DIAG:  [phone] (ICD-10-CM) - Primary osteoarthritis of left knee      THERAPY DIAG:  Pain in joint of left knee  Muscle weakness (generalized)  Stiffness of left knee, not elsewhere classified  Difficulty walking  Rationale for Evaluation and Treatment: Rehabilitation  ONSET DATE: Jan 2024  SUBJECTIVE:   SUBJECTIVE STATEMENT:  Pt states that she had another episode of increase in pain with walking and exercise. Pt states it lasted for about 2 hours and had to hold onto the side of the car.   Eval:  Pt reports that starting in January it started to bother her more. It has been hurting for quite some time with history of surgery. She has had  2x injections from Dr. Jordan Likes. There is roughly a 25% improvement so far. Even sitting started to bother the knee. Pt states that just walking hurts the knee. Pt states that palpation to the knee and cyst does recreate. Pt states she is fairly sedentary but she does her errands with pain. Her MS effects her functional mobility and endurance. Pt does not enjoy the pool/aquatic therapy. Pt states that stairs bother the knee. Pt states that she can walk about 10-15 mins of walking before needing to sit. Pt feels like the knee buckles weakly.     PERTINENT HISTORY: 2/23 fusion of T7-S1 for scoliosis; L knee surgery medial condyle excision for osteochondritis desiccans; L meniscus tear; MS relapsing remitting  PAIN:  Are you having pain? Yes: NPRS scale: 4/10 Pain location: behind the knee the L knee, and lateral inferior patellar pole  Pain description: sharp, aching Aggravating factors: bending the knee, putting weight through the knee Relieving factors: resting, sitting   PRECAUTIONS: None  WEIGHT BEARING RESTRICTIONS: No  FALLS:  Has patient fallen in last 6 months? No  LIVING ENVIRONMENT: Lives with: lives alone Lives in: House/apartment Stairs: No Has following equipment at home: [facility] and walker, does not use either   OCCUPATION: ICU nurse, retired   PLOF: Independent with basic ADLs  PATIENT GOALS: Pt would like to avoid knee surgery.    OBJECTIVE:   FOTO 3/15 47  FOTO 3/27 57 (exceeded expectations)   LOWER EXTREMITY ROM:   Active ROM Right eval Left eval L 3/27  Hip flexion 115 115   Knee flexion 130 115 121  Knee extension 2 -10 -2   (Blank rows = not tested)   LOWER EXTREMITY MMT:   MMT Right eval Left eval L 3/27  Hip flexion       Hip extension       Hip abduction 21.4 19.7 31.9  Hip adduction       Hip internal rotation       Hip external rotation       Knee flexion 17.8 12.6 38.7  Knee extension 19.2 15.2 32.1   (Blank rows = not tested)      FUNCTIONAL TESTS:  Timed up and go (TUG): <10s   DIAGNOSTIC FINDINGS:     IMPRESSION: 1. No acute findings. 2. Moderate tricompartmental osteoarthritic change most prominent over the patellofemoral joint. 3. 2.5 cm soft tissue calcification over the popliteal fossa possibly within a complicated Baker cyst.  PATIENT SURVEYS:  FOTO 44 FOTO 47  3/15 52 @ D/C 12 pts MCII   TODAY'S TREATMENT:                                                                                                                              DATE:   5/1 STM L quad- VL, rec fem, VM, biceps femoris  Program Notes Quadriceps massage with rolling pin and hamstring massage with a tennis  Exercises - Sidelying Quadriceps Stretch with Strap  - 2 x daily - 7 x weekly - 1 sets - 3 reps - 30 hold - Supine Piriformis Stretch with Foot on Ground (Mirrored)  - 2 x daily - 7 x weekly - 1 sets - 3 reps - 30 hold - Supine Hamstring Stretch  - 1 x daily - 7 x weekly - 1 sets - 10 reps - 10 hold - Seated Table Hamstring Stretch  - 1 x daily - 7 x weekly - 3 sets - 30 seconds hold - Heel Raises with Counter Support  - 1 x daily - 2-3 x weekly - 3 sets - 10 reps - Side Stepping with Resistance at Thighs  - 1 x daily - 2-3 x weekly - 1 sets - 3 reps - Step Up  - 1 x daily - 2-3 x weekly - 3 sets - 6 reps - Full Leg Press  - 1 x daily - 2-3 x weekly - 3 sets - 10 reps - Eccentric Knee Extension with Weight Machine  - 1 x daily - 2-3 x weekly - 3 sets - 10 reps  4/24 STM L quad- VL, rec fem, VM Active release of L quad    S/L quad stretch 30s 3x Standing quad stretch with chair   Activity modifications, exercise volume, self recovery technique  4/18 L knee Tib/fem grade II-III TKE mob L knee PROM STM posterior knee and distal ITB  Supine SLR 2 lb 3x10L LAQ-2x10 Long sitting  HSS 30secx3L Cannot bridge due to back*  Standing hip ABD GTB 3x10 8" box step up 3x6  4/9 L knee Tib/fem grade II-III TKE mob L  knee PROM STM posterior knee  Supine SLR 2 lb 3x10L Long sitting  HSS 30secx3L Cannot bridge due to back*  Knee extension machine 2x10 10lbs  HS curl machine 25lbs 3x10 Lunge holds 3 second hold x10-fwd  Standing hip ABD GTB 3x10 8" box step up 3x6  Shuttle leg press- staggered 75lbs 2x10 (supine-the back rest bothered her back)    4/1 L knee Tib/fem grade IV TKE mob  STM L HS Supine HS stretch 10s 10x     Frequency, volume, intensity modifications; walking program increase, self recovery and massage    PATIENT EDUCATION:  Education details: anatomy, exercise progression, DOMS expectations, muscle firing,  envelope of function, HEP, POC  Person educated: Patient Education method: Explanation, Demonstration, Tactile cues, Verbal cues, and Handouts Education comprehension: verbalized understanding, returned demonstration, verbal cues required, and tactile cues required  HOME EXERCISE PROGRAM:  Access Code: [email or web address] URL: [email or web address] Date: 04/26/2022 Prepared by: Zebedee Iba  ASSESSMENT:  CLINICAL IMPRESSION: Pt with lateral knee irritation that appears related to attachments onto the ITB. Pt with increase in hypertonicity of the L quad, HS, and glute likely causing excessive tensile stress at the lateral knee. Pt advised on new mobility exercise, self STM, new gym based machines, and referral to personal training. Plan to additional final machine based LE exercises to be added at next session and plan for D/C if not further irritation to the L knee. Pt would benefit from continued skilled therapy in order to reach goals and maximize functional L LE strength and ROM for return to exercise and fitness.   OBJECTIVE IMPAIRMENTS decreased mobility, difficulty walking, decreased ROM, decreased strength, increased muscle spasms, improper body mechanics, postural dysfunction, and pain.    ACTIVITY LIMITATIONS lifting, sitting, standing, squatting, stairs,  transfers, locomotion level   PARTICIPATION LIMITATIONS: cleaning, laundry, driving, shopping, community activity, occupation, yard work, and exercise   PERSONAL FACTORS Age, Fitness, Time since onset of injury/illness/exacerbation, and 2+ comorbidity:    are also affecting patient's functional outcome.    REHAB POTENTIAL: Good   CLINICAL DECISION MAKING: unstable/complicated   EVALUATION COMPLEXITY: Moderate     GOALS:     SHORT TERM GOALS: Target date: 06/07/2022   Pt will become independent with HEP in order to demonstrate synthesis of PT education..   Goal status: MET 3/13   2.  Pt will score at least 12 pt increase on FOTO to demonstrate functional improvement in MCII and pt perceived function.      Goal status: MET   3.  Pt will be able to demonstrate/report ability to sit/stand/sleep for extended periods of time without pain in order to demonstrate functional improvement and tolerance to static positioning.    Goal status: MET     LONG TERM GOALS: Target date: 07/19/2022    Pt  will become independent with final HEP in order to demonstrate synthesis of PT education.   Goal status: MET   2.  Pt will score >/= 52 on FOTO to demonstrate improvement in perceived bilat knee function.    Goal status: MET   3.  Pt will be able to demonstrate squat to at least parallel in order to demonstrate functional improvement in LE function for self-care and house hold duties.    Goal status: ongoing   4.  Pt will be able to demonstrate kneeling to stand and stand to kneeling transfer without pain in order to demonstrate functional improvement in LE function for ADL/house hold duties.    Goal status: ongoing       PLAN: PT FREQUENCY: 1-2x/week   PT DURATION: 12 weeks (likely DC in 8)   PLANNED INTERVENTIONS: Therapeutic exercises, Therapeutic activity, Neuromuscular re-education, Balance training, Gait training, Patient/Family education, Self Care, and Joint mobilization    PLAN FOR NEXT SESSION: continue with L quad and hip strength, knee joint TKE mobs  Zebedee Iba, PT 07/07/2022, 1:01 PM

## 2022-07-12 ENCOUNTER — Other Ambulatory Visit: Payer: Self-pay | Admitting: Neurology

## 2022-07-13 MED ORDER — CLONAZEPAM 1 MG PO TABS: 1.0000 mg | ORAL_TABLET | Freq: Three times a day (TID) | ORAL | 0 refills | Status: DC | PRN

## 2022-07-14 ENCOUNTER — Other Ambulatory Visit: Payer: Self-pay

## 2022-07-14 ENCOUNTER — Other Ambulatory Visit: Payer: Self-pay | Admitting: *Deleted

## 2022-07-14 MED ORDER — DULOXETINE HCL 60 MG PO CPEP: ORAL_CAPSULE | ORAL | 0 refills | Status: DC

## 2022-07-14 MED ORDER — AMPHETAMINE-DEXTROAMPHETAMINE 20 MG PO TABS: 20.0000 mg | ORAL_TABLET | Freq: Two times a day (BID) | ORAL | 0 refills | Status: DC

## 2022-07-14 NOTE — Telephone Encounter (Signed)
Pt last seen on 02/10/22 per note "Cymbalta helps with depression and dysesthesias. "  Follow up scheduled on 08/18/22  Last filled on 07/08/22 #30 tablets (30 day supply)  Pharmacy is requesting refills to be placed on file

## 2022-07-21 DIAGNOSIS — M4004 Postural kyphosis, thoracic region: Secondary | ICD-10-CM | POA: Diagnosis not present

## 2022-08-04 ENCOUNTER — Ambulatory Visit (HOSPITAL_BASED_OUTPATIENT_CLINIC_OR_DEPARTMENT_OTHER): Payer: Medicare Other | Admitting: Physical Therapy

## 2022-08-09 ENCOUNTER — Ambulatory Visit (HOSPITAL_BASED_OUTPATIENT_CLINIC_OR_DEPARTMENT_OTHER): Payer: Medicare Other | Attending: Family Medicine | Admitting: Physical Therapy

## 2022-08-09 ENCOUNTER — Other Ambulatory Visit: Payer: Self-pay | Admitting: Neurology

## 2022-08-09 ENCOUNTER — Encounter (HOSPITAL_BASED_OUTPATIENT_CLINIC_OR_DEPARTMENT_OTHER): Payer: Self-pay | Admitting: Physical Therapy

## 2022-08-09 DIAGNOSIS — R293 Abnormal posture: Secondary | ICD-10-CM | POA: Diagnosis not present

## 2022-08-09 DIAGNOSIS — R262 Difficulty in walking, not elsewhere classified: Secondary | ICD-10-CM | POA: Insufficient documentation

## 2022-08-09 DIAGNOSIS — M25662 Stiffness of left knee, not elsewhere classified: Secondary | ICD-10-CM | POA: Insufficient documentation

## 2022-08-09 DIAGNOSIS — M25562 Pain in left knee: Secondary | ICD-10-CM | POA: Diagnosis not present

## 2022-08-09 DIAGNOSIS — M6281 Muscle weakness (generalized): Secondary | ICD-10-CM | POA: Diagnosis not present

## 2022-08-09 NOTE — Therapy (Signed)
OUTPATIENT PHYSICAL THERAPY LOWER EXTREMITY TREATMENT  PHYSICAL THERAPY DISCHARGE SUMMARY  Visits from Start of Care: 16  Plan: Patient agrees to discharge.  Patient goals were met. Patient is being discharged due to meeting the stated rehab goals.         Patient Name: Sheri Burns MRN: [id number] DOB:October 11, 1955, 67 y.o., female Today's Date: 08/09/2022  END OF SESSION:  PT End of Session - 08/09/22 0921     Visit Number 16    Number of Visits 16    Date for PT Re-Evaluation 07/25/22    Authorization Type BC[employer] MCR    PT Start Time 0847    PT Stop Time 0915    PT Time Calculation (min) 28 min    Activity Tolerance Patient tolerated treatment well    Behavior During Therapy [facility] for tasks assessed/performed                    Past Medical History:  Diagnosis Date   Anxiety    Bulging lumbar disc 1980   Chronic pansinusitis    Colon polyp    Depression    GERD (gastroesophageal reflux disease)    Gynecological examination    sees Dr. Aram Beecham Romine    Headache(784.0)    Hypertension    Multiple sclerosis (HCC) since 1992   Dr. Despina Arias in [facility]   Multiple thyroid nodules 1995   per pt. resolved on its own follwed by endo then released   Neurogenic bladder disorder 12/2009   w/ [facility] dx. Dr. McDiarmid   Neuromuscular disorder [facility])    Multiple sclerosis   Neuropathy    Osteoarthritis    Osteoporosis    last DEXA 2018   Overactive bladder    sees Dr. Lorin Picket McDiarmid    Pancreatitis 1977   Pneumonia    Ulcerative proctitis (HCC)    Vision abnormalities    Past Surgical History:  Procedure Laterality Date   APPENDECTOMY     breast ruptured  left   ductal cyst   CARPAL TUNNEL RELEASE Right    CHOLECYSTECTOMY     COLONOSCOPY  01/10/09   repeat in 5 yrs Dr. Charlott Rakes   CYSTOSCOPY WITH BIOPSY N/A 01/06/2022   Procedure: CYSTOSCOPY WITH BLADDER/URETHRAL BIOPSY;  Surgeon: Sebastian Ache, MD;  Location: [facility];  Service: Urology;  Laterality: N/A;  45 MINS   DILATION AND CURETTAGE OF UTERUS     LAPAROSCOPY     x 3   left knee surgery  1978   to remove bone tumor     NASAL SINUS SURGERY Bilateral 01/13/2015   Procedure: ENDOSCOPIC SINUS SURGERY;  Surgeon: Serena Colonel, MD;  Location: [facility];  Service: ENT;  Laterality: Bilateral;   NASAL SINUS SURGERY  05/2017   NASAL TURBINATE REDUCTION     pubo vaginal sling  10/2003   TONSILLECTOMY     vaginal cystocele repair     anterior   VAGINAL HYSTERECTOMY  1995   AUB & Endometriosis   widom teeth removal     Patient Active Problem List   Diagnosis Date Noted   Vertigo 08/05/2021   Chronic prescription opiate use 12/14/2018   Carpal tunnel syndrome, bilateral 09/13/2017   Sacroiliac joint pain 09/13/2017   Urinary urgency 06/22/2017   Sciatica, right side 06/17/2016   Dysesthesia 10/30/2015   Right carpal tunnel syndrome 10/30/2015   Sciatica of left side 07/16/2015   Chronic ethmoidal sinusitis 06/26/2015  High risk medication use 07/08/2014   Chronic fatigue 04/02/2014   Dysphagia 04/02/2014   Ataxic gait 04/02/2014   Vitamin D deficiency 06/08/2012   OVERACTIVE BLADDER 08/01/2009   Osteoporosis 08/01/2009   Depression with anxiety 07/04/2007   MULTIPLE SCLEROSIS 07/04/2007   OA (osteoarthritis) of knee 07/04/2007   HEADACHE 07/04/2007   COLONIC POLYPS, HX OF 07/04/2007    PCP: Thana Ates, MD   REFERRING PROVIDER:   Myra Rude, MD    REFERRING DIAG:  [phone] (ICD-10-CM) - Primary osteoarthritis of left knee      THERAPY DIAG:  Pain in joint of left knee  Muscle weakness (generalized)  Stiffness of left knee, not elsewhere classified  Difficulty walking  Rationale for Evaluation and Treatment: Rehabilitation  ONSET DATE: Jan 2024  SUBJECTIVE:   SUBJECTIVE STATEMENT:  Pt states she is in the gym 2 days a week and is able to walk most other days. The knee is aching/sore but will  recovery again. Stairs and inclines are still "noticeable" but not painful. Kneeling is still painful.   Eval:  Pt reports that starting in January it started to bother her more. It has been hurting for quite some time with history of surgery. She has had 2x injections from Dr. Jordan Likes. There is roughly a 25% improvement so far. Even sitting started to bother the knee. Pt states that just walking hurts the knee. Pt states that palpation to the knee and cyst does recreate. Pt states she is fairly sedentary but she does her errands with pain. Her MS effects her functional mobility and endurance. Pt does not enjoy the pool/aquatic therapy. Pt states that stairs bother the knee. Pt states that she can walk about 10-15 mins of walking before needing to sit. Pt feels like the knee buckles weakly.     PERTINENT HISTORY: 2/23 fusion of T7-S1 for scoliosis; L knee surgery medial condyle excision for osteochondritis desiccans; L meniscus tear; MS relapsing remitting  PAIN:  Are you having pain? yes: NPRS scale: 1-2/10 Pain location: behind the knee the L knee, and lateral inferior patellar pole  Pain description: sharp, aching Aggravating factors: bending the knee, putting weight through the knee Relieving factors: resting, sitting   PRECAUTIONS: None  WEIGHT BEARING RESTRICTIONS: No  FALLS:  Has patient fallen in last 6 months? No  LIVING ENVIRONMENT: Lives with: lives alone Lives in: House/apartment Stairs: No Has following equipment at home: [facility] and walker, does not use either   OCCUPATION: ICU nurse, retired   PLOF: Independent with basic ADLs  PATIENT GOALS: Pt would like to avoid knee surgery.    OBJECTIVE:   FOTO 3/15 47  FOTO 3/27 57 (exceeded expectations)   LOWER EXTREMITY ROM:   Active ROM Right eval Left eval L 3/27  Hip flexion 115 115   Knee flexion 130 115 121  Knee extension 2 -10 -2   (Blank rows = not tested)   LOWER EXTREMITY MMT:   MMT Right eval  Left eval L 3/27 L 6/3   Hip flexion        Hip extension        Hip abduction 21.4 19.7 31.9   Hip adduction        Hip internal rotation        Hip external rotation        Knee flexion 17.8 12.6 38.7 38.2  Knee extension 19.2 15.2 32.1 39.2   (Blank rows = not tested)  FUNCTIONAL TESTS:  Timed up and go (TUG): <10s   DIAGNOSTIC FINDINGS:   IMPRESSION: 1. No acute findings. 2. Moderate tricompartmental osteoarthritic change most prominent over the patellofemoral joint. 3. 2.5 cm soft tissue calcification over the popliteal fossa possibly within a complicated Baker cyst.  PATIENT SURVEYS:  FOTO 44 FOTO 47 3/15 52 @ D/C 12 pts MCII   TODAY'S TREATMENT:                                                                                                                              DATE:   6/3  Program Notes walking at least 10 mins a dayConsider adding a hiking pole to the R handQuadriceps massage with rolling pin and hamstring massage with a tennis  Exercises - Sidelying Quadriceps Stretch with Strap  - 2 x daily - 7 x weekly - 1 sets - 3 reps - 30 hold - Supine Piriformis Stretch with Foot on Ground (Mirrored)  - 2 x daily - 7 x weekly - 1 sets - 3 reps - 30 hold - Supine Hamstring Stretch  - 1 x daily - 7 x weekly - 1 sets - 10 reps - 10 hold - Seated Table Hamstring Stretch  - 1 x daily - 7 x weekly - 3 sets - 30 seconds hold - Heel Raises with Counter Support  - 1 x daily - 2-3 x weekly - 3 sets - 10 reps - Side Stepping with Resistance at Thighs  - 1 x daily - 2-3 x weekly - 1 sets - 3 reps - Step Up  - 1 x daily - 2-3 x weekly - 3 sets - 6 reps - Full Leg Press  - 1 x daily - 2-3 x weekly - 3 sets - 10 reps - Eccentric Knee Extension with Weight Machine  - 1 x daily - 2-3 x weekly - 3 sets - 10 reps - Hamstring Curl with Weight Machine  - 1 x daily - 2-3 x weekly - 3 sets - 10 reps  5/1 STM L quad- VL, rec fem, VM, biceps femoris  Program  Notes Quadriceps massage with rolling pin and hamstring massage with a tennis  Exercises - Sidelying Quadriceps Stretch with Strap  - 2 x daily - 7 x weekly - 1 sets - 3 reps - 30 hold - Supine Piriformis Stretch with Foot on Ground (Mirrored)  - 2 x daily - 7 x weekly - 1 sets - 3 reps - 30 hold - Supine Hamstring Stretch  - 1 x daily - 7 x weekly - 1 sets - 10 reps - 10 hold - Seated Table Hamstring Stretch  - 1 x daily - 7 x weekly - 3 sets - 30 seconds hold - Heel Raises with Counter Support  - 1 x daily - 2-3 x weekly - 3 sets - 10 reps - Side Stepping with Resistance at Thighs  - 1 x daily - 2-3 x  weekly - 1 sets - 3 reps - Step Up  - 1 x daily - 2-3 x weekly - 3 sets - 6 reps - Full Leg Press  - 1 x daily - 2-3 x weekly - 3 sets - 10 reps - Eccentric Knee Extension with Weight Machine  - 1 x daily - 2-3 x weekly - 3 sets - 10 reps  4/24 STM L quad- VL, rec fem, VM Active release of L quad    S/L quad stretch 30s 3x Standing quad stretch with chair   Activity modifications, exercise volume, self recovery technique  4/18 L knee Tib/fem grade II-III TKE mob L knee PROM STM posterior knee and distal ITB  Supine SLR 2 lb 3x10L LAQ-2x10 Long sitting  HSS 30secx3L Cannot bridge due to back*  Standing hip ABD GTB 3x10 8" box step up 3x6  4/9 L knee Tib/fem grade II-III TKE mob L knee PROM STM posterior knee  Supine SLR 2 lb 3x10L Long sitting  HSS 30secx3L Cannot bridge due to back*  Knee extension machine 2x10 10lbs  HS curl machine 25lbs 3x10 Lunge holds 3 second hold x10-fwd  Standing hip ABD GTB 3x10 8" box step up 3x6  Shuttle leg press- staggered 75lbs 2x10 (supine-the back rest bothered her back)    4/1 L knee Tib/fem grade IV TKE mob  STM L HS Supine HS stretch 10s 10x     Frequency, volume, intensity modifications; walking program increase, self recovery and massage    PATIENT EDUCATION:  Education details: anatomy, exercise progression, DOMS  expectations, muscle firing,  envelope of function, HEP, POC  Person educated: Patient Education method: Explanation, Demonstration, Tactile cues, Verbal cues, and Handouts Education comprehension: verbalized understanding, returned demonstration, verbal cues required, and tactile cues required  HOME EXERCISE PROGRAM:  Access Code: [email or web address] URL: [email or web address] Date: 04/26/2022 Prepared by: Zebedee Iba  ASSESSMENT:  CLINICAL IMPRESSION: Pt has met all PT goals at this time and is working with personal training for progression of LE strength. Pt has good understanding of exercise machine usage, exercise intensity, frequency, and volume progressions over time as well as self pain management strategies. No further needs for PT for this episode of care. D/C knee pain episode.   OBJECTIVE IMPAIRMENTS decreased mobility, difficulty walking, decreased ROM, decreased strength, increased muscle spasms, improper body mechanics, postural dysfunction, and pain.    ACTIVITY LIMITATIONS lifting, sitting, standing, squatting, stairs, transfers, locomotion level   PARTICIPATION LIMITATIONS: cleaning, laundry, driving, shopping, community activity, occupation, yard work, and exercise   PERSONAL FACTORS Age, Fitness, Time since onset of injury/illness/exacerbation, and 2+ comorbidity:    are also affecting patient's functional outcome.    REHAB POTENTIAL: Good   CLINICAL DECISION MAKING: unstable/complicated   EVALUATION COMPLEXITY: Moderate     GOALS:     SHORT TERM GOALS: Target date: 06/07/2022   Pt will become independent with HEP in order to demonstrate synthesis of PT education..   Goal status: MET 3/13   2.  Pt will score at least 12 pt increase on FOTO to demonstrate functional improvement in MCII and pt perceived function.      Goal status: MET   3.  Pt will be able to demonstrate/report ability to sit/stand/sleep for extended periods of time without pain in  order to demonstrate functional improvement and tolerance to static positioning.    Goal status: MET     LONG TERM GOALS: Target date: 07/19/2022    Pt  will become independent with final  HEP in order to demonstrate synthesis of PT education.   Goal status: MET   2.  Pt will score >/= 52 on FOTO to demonstrate improvement in perceived bilat knee function.    Goal status: MET   3.  Pt will be able to demonstrate squat to at least parallel in order to demonstrate functional improvement in LE function for self-care and house hold duties.    Goal status: Met    4.  Pt will be able to demonstrate kneeling to stand and stand to kneeling transfer without pain in order to demonstrate functional improvement in LE function for ADL/house hold duties.    Goal status: Met        PLAN: PT FREQUENCY: 1-2x/week   PT DURATION: 12 weeks (likely DC in 8)   PLANNED INTERVENTIONS: Therapeutic exercises, Therapeutic activity, Neuromuscular re-education, Balance training, Gait training, Patient/Family education, Self Care, and Joint mobilization   PLAN FOR NEXT SESSION: continue with L quad and hip strength, knee joint TKE mobs  Zebedee Iba, PT 08/09/2022, 9:23 AM

## 2022-08-10 ENCOUNTER — Other Ambulatory Visit: Payer: Self-pay | Admitting: *Deleted

## 2022-08-10 MED ORDER — CLONAZEPAM 1 MG PO TABS: 1.0000 mg | ORAL_TABLET | Freq: Three times a day (TID) | ORAL | 0 refills | Status: DC | PRN

## 2022-08-10 MED ORDER — AMPHETAMINE-DEXTROAMPHETAMINE 20 MG PO TABS: 20.0000 mg | ORAL_TABLET | Freq: Two times a day (BID) | ORAL | 0 refills | Status: DC

## 2022-08-10 NOTE — Telephone Encounter (Signed)
Last seen on 02/10/22 Follow up scheduled on 08/18/22 Rx last filled on 07/14/22 # 60 tablets (30 day supply) Rx pending to be signed

## 2022-08-10 NOTE — Telephone Encounter (Signed)
Pt last seen on 02/10/22 Follow up scheduled on 08/18/22 Last filled on 07/13/22 #90 tablets (30 day supply) Rx pending to be signed

## 2022-08-16 DIAGNOSIS — F325 Major depressive disorder, single episode, in full remission: Secondary | ICD-10-CM | POA: Diagnosis not present

## 2022-08-16 DIAGNOSIS — E781 Pure hyperglyceridemia: Secondary | ICD-10-CM | POA: Diagnosis not present

## 2022-08-16 DIAGNOSIS — I10 Essential (primary) hypertension: Secondary | ICD-10-CM | POA: Diagnosis not present

## 2022-08-16 DIAGNOSIS — M810 Age-related osteoporosis without current pathological fracture: Secondary | ICD-10-CM | POA: Diagnosis not present

## 2022-08-17 ENCOUNTER — Other Ambulatory Visit: Payer: Self-pay

## 2022-08-17 ENCOUNTER — Other Ambulatory Visit: Payer: Self-pay | Admitting: Neurology

## 2022-08-17 ENCOUNTER — Encounter (HOSPITAL_BASED_OUTPATIENT_CLINIC_OR_DEPARTMENT_OTHER): Payer: Medicare Other | Admitting: Physical Therapy

## 2022-08-18 ENCOUNTER — Ambulatory Visit: Payer: Medicare Other | Admitting: Neurology

## 2022-08-18 ENCOUNTER — Encounter: Payer: Self-pay | Admitting: Neurology

## 2022-08-18 VITALS — BP 137/80 | HR 82 | Ht 65.0 in | Wt 131.0 lb

## 2022-08-18 DIAGNOSIS — Z79899 Other long term (current) drug therapy: Secondary | ICD-10-CM | POA: Diagnosis not present

## 2022-08-18 DIAGNOSIS — G35 Multiple sclerosis: Secondary | ICD-10-CM | POA: Diagnosis not present

## 2022-08-18 NOTE — Progress Notes (Signed)
[facility]  PATIENT: Sheri Burns DOB: 08-31-1955  REFERRING CLINICIAN: Thayer Headings  HISTORY FROM: Patient REASON FOR VISIT: MS   HISTORICAL  CHIEF COMPLAINT:  Chief Complaint  Patient presents with   Follow-up    RM 2, alone. Last seen 03/04/21. MS DMT: leflunomide. Reports she had 2 episodes of vertigo. One in August 2022 and again 04/2021. Episodes lasted about 4-5 hours. Both times, occurred when she woke up in the morning. Was well hydrated, unsure what caused episodes. 04/2021 had spinal fusion. Doing well since surgery. Still doing PT twice weekly/[facility].     HISTORY OF PRESENT ILLNESS:  Sheri Burns is a 67 year old woman with MS.     Update  08/18/2022: She feels her MS is stable.   She is on leflunomide and tolerates it well.       Gait is doing well and she walks 1 1/4 mile 5 days a week.   She has joined a gym and uses a Psychologist, educational.   No issues with strength.  She has mild foot numbness which is stable.  Vision is fine.    Bladder has some urinary urgency helped by oxybutynin-XL 10 mg.    She no longer has incontinence.      She only has had a couple spells of vertigo, none severe.   She feels fatigue is better.   She is sleeping poorly, she feels mstly due mostly to leg pain.     Mood is doing well in general.   Adderall has helped her MS related ADD and fatigue.   .  She has anxiety and takes clonazepam 1 mg 3 times daily.  She had T7 to S1 fusion (Dr. Selena Batten in [place]) to correct scoliosis/scoliosis and for protrusions.    She needed a second operaio due to CSF leak and infection.  She feels she is recovering and pain is much better,   currently, the back pain and leg pain are greatly improved and she was able to stop the fentanyl.  She still takes tramadol as needed.  She had hematuria and saw urology.  She had outpatient surgery for a urethral mass.  No additional treatment was needed  MS History:   She was diagnosed with  multiple sclerosis more than 20 years ago. Initially, she was treated with Betaseron then Copaxone but has not been on any disease modifying therapy for about 8 years. During this time, she has had occasional  Exacerbations with more fatigue and gait issues.  She also has noted mild cognitive dysfunction worsening over the past 5 or 6 years. Her MRI of the brain performed 08/31/2012 shows foci that are predominantly periventricular. They are consistent with the diagnosis of multiple sclerosis. When this MRI was compared to an MRI dated 10/12/2010, there was one additional small periventricular focus. There were no acute findings on either MRI.   In November 2015, she had an exacerbation with severe fatigue, worsened gait and dysphagia.    She started Aubagio 09/09/14.      IMAGING L-spine MRI (12/22/20 shows. Multiplevel DJD.   Changes comared to 2019 were:   Marland Kitchen New large disc extrusion at L1-2 resulting in severe right lateral recess stenosis and right L2 nerve root impingement. 2. Decreased size of L4-5 disc protrusion with slightly decreased left lateral recess stenosis.  MRI thoracic spine 12/22/2020 showed Thoracic scoliosis.  No evidence of fracture or focal bone lesion.    There was chronic disc degeneration at T2-3 with a  shallow central disc protrusion that narrows the ventral subarachnoid space but does not compress the cord. Chronic endplate marrow changes without evidence of active edema.     Left foraminal narrowing at T9-10, T10-11 and T11-12 with some potential to cause left-sided neural compression, particularly at T10-11.    It does show significant degenerative changes at several levels.  At L4-L5 there appears to be a disc herniation towards the left that could be compressing the left L5 nerve root.  At L3-L4, there are changes more to the right that could affect the right L4 nerve root.  There are also degenerative changes at L1-L2 and L2-L3 though there does not appear to be nerve  root compression at those levels.  MRI brain 7/142021 shows multiple T2/FLAIR hyperintense foci in the hemispheres with a few punctate foci in the pons and cerebral hemispheres. The pattern is consistent with chronic demyelinating plaque associated with multiple sclerosis. None of the foci appears to be acute. Compared to the MRI dated 08/31/2016, there are no new lesions.  MRI 08/31/2016 showed multiple lesions in the hemispheres and pons but no new lesions compared to 10/31/2014.  MRI 10/31/2014 showed periventricular and deep white matter T2/flair hyperintense foci in a pattern and configuration consistent with multiple sclerosis. None of these foci enhanced after contrast administration.  There were no new lesions compared to 08/30/2012 MRI    OTHER  NCV/EMG 10/18/2017 1.    Moderately severe right median neuropathy at the wrist (carpal tunnel syndrome).   Chronic denervation changes were noted in the APB muscle. 2.    Borderline left median neuropathy at the wrist. 3.    There is no evidence of a significant cervical radiculopathy on the right. REVIEW OF SYSTEMS:  Constitutional: No fevers, chills, sweats, or change in appetite.  She notes a lot of fatigue. She is sleeping well. Eyes: No visual changes, double vision, eye pain Ear, nose and throat: No hearing loss, ear pain, nasal congestion, sore throat Cardiovascular: No chest pain, palpitations Respiratory:  No shortness of breath at rest or with exertion.   No wheezes GastrointestinaI: No nausea, vomiting, diarrhea.  Has constipation Genitourinary:  see above. Musculoskeletal:  reports pain in knees.   Reports neck pain and back pain Integumentary: No rash, pruritus, skin lesions Neurological: as above Psychiatric: Depression and  Anxiety noted Endocrine: No palpitations, diaphoresis, change in appetite, change in weigh or increased thirst Hematologic/Lymphatic:  No anemia, purpura, petechiae. Allergic/Immunologic: No itchy/runny  eyes, nasal congestion, recent allergic reactions, rashes  ALLERGIES: Allergies  Allergen Reactions   Amoxicillin Hives and Itching   Codeine Nausea And Vomiting    vomiting   Demerol [Meperidine] Nausea And Vomiting   Ivp Dye [Iodinated Contrast Media] Hives   Penicillins Hives   Stadol [Butorphanol] Other (See Comments)    Per patient psychotic event   Sulfonamide Derivatives Hives   Clindamycin/Lincomycin Rash   Gadolinium Derivatives Hives    HOME MEDICATIONS: Outpatient Medications Prior to Visit  Medication Sig Dispense Refill   acetaminophen (TYLENOL) 500 MG tablet Take 500 mg by mouth 4 (four) times daily.     amLODipine-valsartan (EXFORGE) 5-160 MG tablet Take 1 tablet by mouth daily.     amphetamine-dextroamphetamine (ADDERALL) 20 MG tablet TAKE 1 TABLET BY MOUTH 2 TIMES DAILY 60 tablet 0   Calcium Carb-Cholecalciferol (CALCIUM 600 + D PO) Take 1 tablet by mouth daily.     clonazePAM (KLONOPIN) 1 MG tablet TAKE 1 TABLET BY MOUTH 3 TIMES DAILY AS NEEDED  270 tablet 1   denosumab (PROLIA) 60 MG/ML SOSY injection Inject 60 mg into the skin every 6 (six) months. 1 each 1   DULoxetine (CYMBALTA) 60 MG capsule TAKE 1 CAPSULE BY MOUTH EVERY DAY 90 capsule 0   gabapentin (NEURONTIN) 600 MG tablet TAKE 1 TABLET BY MOUTH 4 TIMES DAILY 360 tablet 3   leflunomide (ARAVA) 20 MG tablet TAKE 1 TABLET BY MOUTH EVERY DAY 90 tablet 3   Multiple Vitamin (MULTIVITAMIN) tablet Take 1 tablet by mouth daily.     oxybutynin (DITROPAN-XL) 10 MG 24 hr tablet Take 1 tablet (10 mg total) by mouth daily. 90 tablet 3   traMADol (ULTRAM) 50 MG tablet TAKE 1 TABLET BY MOUTH EVERY 6 HOURS AS NEEDED (8am, 12 NOON, AT 4PM, AND 8pm) 120 tablet 3   fentaNYL (DURAGESIC) 25 MCG/HR Place 1 patch (25 mcg total) onto the skin every 3 (three) days. 10 patch 0   No facility-administered medications prior to visit.    PAST MEDICAL HISTORY: Past Medical History:  Diagnosis Date   Anxiety    Bulging lumbar disc  1980   Chronic pansinusitis    Colon polyp    Depression    GERD (gastroesophageal reflux disease)    Gynecological examination    sees Dr. Aram Beecham Romine    ZOXWRUEA(540.9)    Hypertension    Multiple sclerosis (HCC) since 1992   Dr. Despina Arias in [facility]   Multiple thyroid nodules 1995   per pt. resolved on its own follwed by endo then released   Neurogenic bladder disorder 12/2009   w/ urge & stress inc. dx. Dr. McDiarmid   Neuropathy    Osteoarthritis    Osteoporosis    last DEXA 2018   Overactive bladder    sees Dr. Lorin Picket McDiarmid    Pancreatitis 1977   Ulcerative proctitis (HCC)    Vision abnormalities     PAST SURGICAL HISTORY: Past Surgical History:  Procedure Laterality Date   APPENDECTOMY     breast ruptured  left   ductal cyst   CARPAL TUNNEL RELEASE Right    CHOLECYSTECTOMY     COLONOSCOPY  01/10/09   repeat in 5 yrs Dr. Charlott Rakes   DILATION AND CURETTAGE OF UTERUS     LAPAROSCOPY     x 3   left knee surgery  1978   to remove bone tumor     NASAL SINUS SURGERY Bilateral 01/13/2015   Procedure: ENDOSCOPIC SINUS SURGERY;  Surgeon: Serena Colonel, MD;  Location: [facility];  Service: ENT;  Laterality: Bilateral;   NASAL SINUS SURGERY  05/2017   NASAL TURBINATE REDUCTION     pubo vaginal sling  10/2003   TONSILLECTOMY     vaginal cystocele repair     anterior   VAGINAL HYSTERECTOMY  1995   AUB & Endometriosis   widom teeth removal      FAMILY HISTORY: Family History  Problem Relation Age of Onset   Osteoporosis Mother        severe   Cancer Mother 37       colon /Breast also m. niece at 67's   Breast cancer Mother    Osteopenia Sister        X 2   Breast cancer Other     SOCIAL HISTORY:  Social History   Socioeconomic History   Marital status: Widowed    Spouse name: Windy Fast   Number of children: 1   Years of education: 32  Highest education level: Not on file  Occupational History   Occupation: BS Nursing     Employer: UNEMPLOYED   Occupation: [employer] consultant  Tobacco Use   Smoking status: Former    Packs/day: 1.00    Years: 5.00    Pack years: 5.00    Types: Cigarettes    Quit date: 03/09/1979    Years since quitting: 42.4   Smokeless tobacco: Never  Vaping Use   Vaping Use: Never used  Substance and Sexual Activity   Alcohol use: No   Drug use: No   Sexual activity: Not Currently    Partners: Male    Birth control/protection: Surgical  Other Topics Concern   Not on file  Social History Narrative   Not on file   Social Determinants of Health   Financial Resource Strain: Not on file  Food Insecurity: Not on file  Transportation Needs: Not on file  Physical Activity: Not on file  Stress: Not on file  Social Connections: Not on file  Intimate Partner Violence: Not on file     PHYSICAL EXAM  Vitals:   08/05/21 0956  BP: 128/68  Pulse: 90  Weight: 130 lb 8 oz (59.2 kg)  Height: 5\' 5"  (1.651 m)    Body mass index is 21.72 kg/m.   General: The patient is well-developed and well-nourished and in no acute distress.     Neurologic Exam  Mental status: The patient is alert and oriented x 3 at the time of the examination. The patient has apparent normal recent and remote memory, with an apparently normal attention span and concentration ability.   Speech is normal.  Cranial nerves: Extraocular movements are full.  Facial strength and sensation is normal.  Trapezius strength is strong..  Motor:  Muscle bulk is normal and tone is increased in legs. Strength is  5 / 5  Sensory: She has numbness and allodynia in the left  L5 distribution.  .   Coordination: Cerebellar testing showed good heel to shin but and finger to nose  Gait and station: Station is stable.  Gait is mildly wide and arthritic.   Tandem gait is wide  The Romberg is negative..   Reflexes: Deep tendon reflexes are symmetric and fairly normal in the arms. However, she has increased reflexes in the legs  with spread at the knees .  No clonus in the ankles...    ______________________________   MULTIPLE SCLEROSIS  Ataxic gait  Urinary urgency  High risk medication use  Vertigo   1.   Continue leflunomide for MS.  Will check blood work .  Will check MRI brain to determine if any progression   If occurring will consider a stronger DMT.   We discussed that in a couple years if MRI and symptos are stable, we might want to consider d/c   2.   Take a clonazepam if vertigo recurs.  She will put 1 or 2 pills by the bedside so she can take more easily. 3.   Try to stay active and exercise as tolerated 4.   She was able to d/c fentanyl as pain is much better.  She is still on prn trmaadol 5.   Return to see [place] in 6 months or sooner for new or worsening neurologic symptoms  This visit is part of a comprehensive longitudinal care medical relationship regarding the patients primary diagnosis of MS and related concerns.   Caelan Atchley A. Epimenio Foot, MD, PhD 08/05/2021, 10:24 AM Certified in Neurology, Clinical  Neurophysiology, Sleep Medicine, Pain Medicine and Neuroimaging  [facility] [address], [address] [place], [place] [address] [phone]

## 2022-08-19 ENCOUNTER — Telehealth: Payer: Self-pay | Admitting: Neurology

## 2022-08-19 LAB — CBC WITH DIFFERENTIAL/PLATELET
Basophils Absolute: 0.1 10*3/uL (ref 0.0–0.2)
Basos: 2 %
EOS (ABSOLUTE): 0.4 10*3/uL (ref 0.0–0.4)
Eos: 5 %
Hematocrit: 37.4 % (ref 34.0–46.6)
Hemoglobin: 12.5 g/dL (ref 11.1–15.9)
Immature Grans (Abs): 0 10*3/uL (ref 0.0–0.1)
Immature Granulocytes: 1 %
Lymphocytes Absolute: 2.4 10*3/uL (ref 0.7–3.1)
Lymphs: 33 %
MCH: 31.9 pg (ref 26.6–33.0)
MCHC: 33.4 g/dL (ref 31.5–35.7)
MCV: 95 fL (ref 79–97)
Monocytes Absolute: 0.8 10*3/uL (ref 0.1–0.9)
Monocytes: 11 %
Neutrophils Absolute: 3.6 10*3/uL (ref 1.4–7.0)
Neutrophils: 48 %
Platelets: 394 10*3/uL (ref 150–450)
RBC: 3.92 x10E6/uL (ref 3.77–5.28)
RDW: 11.6 % — ABNORMAL LOW (ref 11.7–15.4)
WBC: 7.3 10*3/uL (ref 3.4–10.8)

## 2022-08-19 LAB — HEPATIC FUNCTION PANEL
ALT: 17 IU/L (ref 0–32)
AST: 25 IU/L (ref 0–40)
Albumin: 4.3 g/dL (ref 3.9–4.9)
Alkaline Phosphatase: 88 IU/L (ref 44–121)
Bilirubin Total: 0.2 mg/dL (ref 0.0–1.2)
Bilirubin, Direct: <0.1 mg/dL (ref 0.00–0.40)
Total Protein: 7.4 g/dL (ref 6.0–8.5)

## 2022-08-19 NOTE — Telephone Encounter (Signed)
BCBS medicare Berkley Harvey: [id number] exp. 08/19/22-09/17/22 sent to GI [phone]

## 2022-08-22 ENCOUNTER — Ambulatory Visit
Admission: RE | Admit: 2022-08-22 | Discharge: 2022-08-22 | Disposition: A | Discharge status: Home / Self Care | Payer: Medicare Other | Source: Ambulatory Visit | Attending: Neurology | Admitting: Neurology

## 2022-08-22 DIAGNOSIS — Z79899 Other long term (current) drug therapy: Secondary | ICD-10-CM

## 2022-08-22 DIAGNOSIS — G35 Multiple sclerosis: Secondary | ICD-10-CM

## 2022-08-23 DIAGNOSIS — K080 Exfoliation of teeth due to systemic causes: Secondary | ICD-10-CM | POA: Diagnosis not present

## 2022-09-02 ENCOUNTER — Other Ambulatory Visit: Payer: Self-pay | Admitting: Neurology

## 2022-09-03 ENCOUNTER — Ambulatory Visit (HOSPITAL_BASED_OUTPATIENT_CLINIC_OR_DEPARTMENT_OTHER): Payer: Medicare Other | Admitting: Physical Therapy

## 2022-09-03 ENCOUNTER — Encounter (HOSPITAL_BASED_OUTPATIENT_CLINIC_OR_DEPARTMENT_OTHER): Payer: Self-pay | Admitting: Physical Therapy

## 2022-09-03 ENCOUNTER — Other Ambulatory Visit: Payer: Self-pay

## 2022-09-03 DIAGNOSIS — R262 Difficulty in walking, not elsewhere classified: Secondary | ICD-10-CM

## 2022-09-03 DIAGNOSIS — R293 Abnormal posture: Secondary | ICD-10-CM

## 2022-09-03 DIAGNOSIS — M6281 Muscle weakness (generalized): Secondary | ICD-10-CM | POA: Diagnosis not present

## 2022-09-03 DIAGNOSIS — M25662 Stiffness of left knee, not elsewhere classified: Secondary | ICD-10-CM | POA: Diagnosis not present

## 2022-09-03 DIAGNOSIS — M810 Age-related osteoporosis without current pathological fracture: Secondary | ICD-10-CM | POA: Diagnosis not present

## 2022-09-03 DIAGNOSIS — M25562 Pain in left knee: Secondary | ICD-10-CM | POA: Diagnosis not present

## 2022-09-03 NOTE — Therapy (Signed)
OUTPATIENT PHYSICAL THERAPY THORACOLUMBAR EVALUATION   Patient Name: Sheri Burns MRN: [id number] DOB:[birth year], 67 y.o., female Today's Date: 09/03/2022  END OF SESSION:  PT End of Session - 09/03/22 1021     Visit Number 1    Number of Visits 16    Date for PT Re-Evaluation 12/02/22    Authorization Type BCBS MCR    PT Start Time 1015    PT Stop Time 1100    PT Time Calculation (min) 45 min    Activity Tolerance Patient tolerated treatment well    Behavior During Therapy WFL for tasks assessed/performed             Past Medical History:  Diagnosis Date   Anxiety    Bulging lumbar disc 1980   Chronic pansinusitis    Colon polyp    Depression    GERD (gastroesophageal reflux disease)    Gynecological examination    sees Dr. Aram Beecham Romine    Headache(784.0)    Hypertension    Multiple sclerosis (HCC) since 1992   Dr. Despina Arias in [facility]   Multiple thyroid nodules 1995   per pt. resolved on its own follwed by endo then released   Neurogenic bladder disorder 12/2009   w/ urge & stress inc. dx. Dr. McDiarmid   Neuromuscular disorder [facility])    Multiple sclerosis   Neuropathy    Osteoarthritis    Osteoporosis    last DEXA 2018   Overactive bladder    sees Dr. Lorin Picket McDiarmid    Pancreatitis 1977   Pneumonia    Ulcerative proctitis (HCC)    Vision abnormalities    Past Surgical History:  Procedure Laterality Date   APPENDECTOMY     breast ruptured  left   ductal cyst   CARPAL TUNNEL RELEASE Right    CHOLECYSTECTOMY     COLONOSCOPY  01/10/09   repeat in 5 yrs Dr. Charlott Rakes   CYSTOSCOPY WITH BIOPSY N/A 01/06/2022   Procedure: CYSTOSCOPY WITH BLADDER/URETHRAL BIOPSY;  Surgeon: Sebastian Ache, MD;  Location: [facility];  Service: Urology;  Laterality: N/A;  45 MINS   DILATION AND CURETTAGE OF UTERUS     LAPAROSCOPY     x 3   left knee surgery  1978   to remove bone tumor     NASAL SINUS SURGERY Bilateral 01/13/2015   Procedure:  ENDOSCOPIC SINUS SURGERY;  Surgeon: Serena Colonel, MD;  Location: [facility];  Service: ENT;  Laterality: Bilateral;   NASAL SINUS SURGERY  05/2017   NASAL TURBINATE REDUCTION     pubo vaginal sling  10/2003   TONSILLECTOMY     vaginal cystocele repair     anterior   VAGINAL HYSTERECTOMY  1995   AUB & Endometriosis   widom teeth removal     Patient Active Problem List   Diagnosis Date Noted   Vertigo 08/05/2021   Chronic prescription opiate use 12/14/2018   Carpal tunnel syndrome, bilateral 09/13/2017   Sacroiliac joint pain 09/13/2017   Urinary urgency 06/22/2017   Sciatica, right side 06/17/2016   Dysesthesia 10/30/2015   Right carpal tunnel syndrome 10/30/2015   Sciatica of left side 07/16/2015   Chronic ethmoidal sinusitis 06/26/2015   High risk medication use 07/08/2014   Chronic fatigue 04/02/2014   Dysphagia 04/02/2014   Ataxic gait 04/02/2014   Vitamin D deficiency 06/08/2012   OVERACTIVE BLADDER 08/01/2009   Osteoporosis 08/01/2009   Depression with anxiety 07/04/2007   MULTIPLE SCLEROSIS 07/04/2007  OA (osteoarthritis) of knee 07/04/2007   HEADACHE 07/04/2007   COLONIC POLYPS, HX OF 07/04/2007    PCP:  Thana Ates, MD       REFERRING PROVIDER:  Tama Gander, MD     REFERRING DIAG: M41.50 (ICD-10-CM) - Other secondary scoliosis, site unspecified   Rationale for Evaluation and Treatment: Rehabilitation  THERAPY DIAG:  Difficulty walking  Posture abnormality  ONSET DATE: 2023  SUBJECTIVE:                                                                                                                                                                                           SUBJECTIVE STATEMENT:  Pt states that her most recent T/S x-ray shows there is narrowing at T6. She is concerned about a future fusion of T6 to T2, per her MD. She is for postural strengthening. She notices more trouble with cervical posture/position. She has a  bulging disc at T2. In the last month, she notices where the "fusion ends. " Pt does have some neck discomfort and dull HA due to her neck position. Pt does read a lot and does prop the book up in order to read. Pt denies NT into hands or loss of strength into arms. Denies 5Ds 3Ns   Pt does report that the L knee recently has been bothering her again. She lifts 2x/week and walks 5x/week. She has been doing self massage and stretching. No feeling of increased stiffness. Pain feels to be inside the knee.    PERTINENT HISTORY:  2/23 fusion of T7-S1 for scoliosis; L knee surgery medial condyle excision for osteochondritis desiccans; L meniscus tear; MS relapsing remitting; history of vertigo  PAIN:  Are you having pain? No  PRECAUTIONS: None   WEIGHT BEARING RESTRICTIONS: No   FALLS:  Has patient fallen in last 6 months? No   LIVING ENVIRONMENT: Lives with: lives alone Lives in: House/apartment Stairs: No Has following equipment at home: [facility] and walker, does not use either    OCCUPATION: ICU nurse, retired    PLOF: Independent with basic ADLs  PATIENT GOALS: prolonging surgery   OBJECTIVE:   DIAGNOSTIC FINDINGS:  N/A recent  PATIENT SURVEYS:  FOTO 55 54 @ DC  SCREENING FOR RED FLAGS: Bowel or bladder incontinence: No Spinal tumors: No Cauda equina syndrome: No Compression fracture: No Abdominal aneurysm: No  COGNITION: Overall cognitive status: Within functional limits for tasks assessed     SENSATION: WFL  MUSCLE LENGTH: Hamstrings:   POSTURE: rounded shoulders, forward head, and increased thoracic kyphosis  PALPATION: TTP of bilat thoracic parapsinals in T4-6 region, rhomboids, lats  LUMBAR ROM:  thoracolumbar ROM limited as expected with T7-S1 fusion  Difficulty getting into T/S extension in standing; able to once combined with scapular retraction  CERVICAL ROM:   Active ROM A/PROM (deg) eval  Flexion 80  Extension 60  Right lateral flexion 35   Left lateral flexion 35  Right rotation 75  Left rotation 75   (Blank rows = not tested)   MMT Right 6/28  Left 6/28  Shoulder flexion 4+/5 4+/5  Shoulder extension    Shoulder abduction 4+/5 4+/5  Shoulder adduction    Shoulder internal rotation 4+/5 4+/5  Shoulder external rotation 4/5 4/5  (Blank rows = not tested)   GAIT: Distance walked: 1ft Assistive device utilized: None Level of assistance: Complete Independence Comments: WFL  TODAY'S TREATMENT:                                                                                                                              DATE: 6/28  Exercises - Seated Cervical Retraction  - 3-4 x daily - 7 x weekly - 1 sets - 10 reps - Shoulder External Rotation and Scapular Retraction with Resistance  - 3-4 x daily - 7 x weekly - 1 sets - 10 reps - Single Arm Doorway Pec Stretch at 90 Degrees Abduction  - 2 x daily - 7 x weekly - 1 sets - 3 reps - 30 hold - Seated Row Cable Machine  - 1 x daily - 2-3 x weekly - 2 sets - 10 reps     PATIENT EDUCATION:  Education details: MOI, diagnosis, prognosis, anatomy, exercise progression, DOMS expectations, muscle firing,  envelope of function, HEP, POC  Person educated: Patient Education method: Explanation, Demonstration, Tactile cues, Verbal cues, and Handouts Education comprehension: verbalized understanding, returned demonstration, verbal cues required, and tactile cues required  HOME EXERCISE PROGRAM: Access Code: [email or web address] URL: [email or web address] Date: 09/03/2022 Prepared by: Zebedee Iba  ASSESSMENT:  CLINICAL IMPRESSION: Patient is a 67 y.o. female who was seen today for physical therapy evaluation and treatment for c/c of postural dysfunction. Pt's s/s appear consistent with chronic fwd head, rounded shoulder, and increase in thoracic kyphosis. Pt has no pain at rest or discomfort associated with her upper back posture. However, pt's posture is of great concern to  pt as MD is concerned with potential further fusion should there be progression of the forward narrowing. Pt does require signficant VC and TC for scapular retraction and T/S extension with exercise. Pt advised strongly to consider postural movement breaks during her day, tactile input such as paper tape, and using gym based exercise to continue with truncal strength. Plan to continue with postural correction and strength at future sessions. Pt would benefit from continued skilled therapy in order to reach goals and maximize functional postural strength and ROM for prevention of further surgical intervention.   OBJECTIVE IMPAIRMENTS decreased mobility, difficulty walking, decreased ROM, decreased strength, increased muscle spasms, improper body mechanics, postural dysfunction, and pain.  ACTIVITY LIMITATIONS lifting, sitting, standing, squatting, stairs, transfers, locomotion level   PARTICIPATION LIMITATIONS: cleaning, laundry, driving, shopping, community activity, occupation, yard work, and exercise   PERSONAL FACTORS Age, Fitness, Time since onset of injury/illness/exacerbation, and 2+ comorbidity:    are also affecting patient's functional outcome.    REHAB POTENTIAL: Good   CLINICAL DECISION MAKING: unstable/complicated   EVALUATION COMPLEXITY: Moderate     GOALS:     SHORT TERM GOALS: Target date: 10/15/2022      Pt will become independent with HEP in order to demonstrate synthesis of PT education..   Goal status: INITIAL   2.  Pt will score at least 1 pt increase on FOTO to demonstrate functional improvement in MCII and pt perceived function.      Goal status: INITIAL   3.  Pt will be able to demonstrate/report ability to sit/stand/sleep for extended periods of time without neck or shoulder discomfort in order to demonstrate functional improvement and tolerance to static positioning.    Goal status: INITIAL     LONG TERM GOALS: Target date: 11/26/2022    Pt  will become  independent with final HEP in order to demonstrate synthesis of PT education.   Goal status: INITIAL   2.  Pt will score >/= 55 on FOTO to demonstrate improvement in perceived bilat knee function.    Goal status: INITIAL   3.  Pt will be able to demonstrate ability to maintain upright posture and alignment during all exercise in order to demonstrate functional improvement in postural awareness    Goal status: INITIAL          PLAN: PT FREQUENCY: 1-2x/week   PT DURATION: 12 weeks (likely DC in 6)   PLANNED INTERVENTIONS: Therapeutic exercises, Therapeutic activity, Neuromuscular re-education, Balance training, Gait training, Patient/Family education, Self Care, and Joint mobilization   PLAN FOR NEXT SESSION: anterior stretching, postural strengthening, integration into gym based routine, modified T/S extension     Zebedee Iba, PT 09/03/2022, 12:10 PM

## 2022-09-06 ENCOUNTER — Encounter: Payer: Self-pay | Admitting: Neurology

## 2022-09-06 NOTE — Addendum Note (Signed)
Addended by: Zebedee Iba on: 09/06/2022 09:02 AM   Modules accepted: Orders

## 2022-09-07 ENCOUNTER — Ambulatory Visit (HOSPITAL_BASED_OUTPATIENT_CLINIC_OR_DEPARTMENT_OTHER): Payer: Medicare Other | Attending: Family Medicine

## 2022-09-07 ENCOUNTER — Encounter (HOSPITAL_BASED_OUTPATIENT_CLINIC_OR_DEPARTMENT_OTHER): Payer: Self-pay

## 2022-09-07 DIAGNOSIS — R293 Abnormal posture: Secondary | ICD-10-CM | POA: Insufficient documentation

## 2022-09-07 DIAGNOSIS — R262 Difficulty in walking, not elsewhere classified: Secondary | ICD-10-CM | POA: Insufficient documentation

## 2022-09-07 NOTE — Therapy (Signed)
OUTPATIENT PHYSICAL THERAPY THORACOLUMBAR TREATMENT   Patient Name: Sheri Burns MRN: [id number] DOB:April 18, 1955, 67 y.o., female Today's Date: 09/07/2022  END OF SESSION:  PT End of Session - 09/07/22 1549     Visit Number 2    Number of Visits 16    Date for PT Re-Evaluation 12/02/22    Authorization Type BCBS MCR    PT Start Time 1603    PT Stop Time 1648    PT Time Calculation (min) 45 min    Activity Tolerance Patient tolerated treatment well    Behavior During Therapy WFL for tasks assessed/performed              Past Medical History:  Diagnosis Date   Anxiety    Bulging lumbar disc 1980   Chronic pansinusitis    Colon polyp    Depression    GERD (gastroesophageal reflux disease)    Gynecological examination    sees Dr. Aram Beecham Romine    Headache(784.0)    Hypertension    Multiple sclerosis (HCC) since 1992   Dr. Despina Arias in [facility]   Multiple thyroid nodules 1995   per pt. resolved on its own follwed by endo then released   Neurogenic bladder disorder 12/2009   w/ urge [facility] stress [facility]. dx. Dr. McDiarmid   Neuromuscular disorder [facility])    Multiple sclerosis   Neuropathy    Osteoarthritis    Osteoporosis    last DEXA 2018   Overactive bladder    sees Dr. Lorin Picket McDiarmid    Pancreatitis 1977   Pneumonia    Ulcerative proctitis (HCC)    Vision abnormalities    Past Surgical History:  Procedure Laterality Date   APPENDECTOMY     breast ruptured  left   ductal cyst   CARPAL TUNNEL RELEASE Right    CHOLECYSTECTOMY     COLONOSCOPY  01/10/09   repeat in 5 yrs Dr. Charlott Rakes   CYSTOSCOPY WITH BIOPSY N/A 01/06/2022   Procedure: CYSTOSCOPY WITH BLADDER/URETHRAL BIOPSY;  Surgeon: Sebastian Ache, MD;  Location: [facility];  Service: Urology;  Laterality: N/A;  45 MINS   DILATION AND CURETTAGE OF UTERUS     LAPAROSCOPY     x 3   left knee surgery  1978   to remove bone tumor     NASAL SINUS SURGERY Bilateral 01/13/2015   Procedure:  ENDOSCOPIC SINUS SURGERY;  Surgeon: Serena Colonel, MD;  Location: [facility];  Service: ENT;  Laterality: Bilateral;   NASAL SINUS SURGERY  05/2017   NASAL TURBINATE REDUCTION     pubo vaginal sling  10/2003   TONSILLECTOMY     vaginal cystocele repair     anterior   VAGINAL HYSTERECTOMY  1995   AUB & Endometriosis   widom teeth removal     Patient Active Problem List   Diagnosis Date Noted   Vertigo 08/05/2021   Chronic prescription opiate use 12/14/2018   Carpal tunnel syndrome, bilateral 09/13/2017   Sacroiliac joint pain 09/13/2017   Urinary urgency 06/22/2017   Sciatica, right side 06/17/2016   Dysesthesia 10/30/2015   Right carpal tunnel syndrome 10/30/2015   Sciatica of left side 07/16/2015   Chronic ethmoidal sinusitis 06/26/2015   High risk medication use 07/08/2014   Chronic fatigue 04/02/2014   Dysphagia 04/02/2014   Ataxic gait 04/02/2014   Vitamin D deficiency 06/08/2012   OVERACTIVE BLADDER 08/01/2009   Osteoporosis 08/01/2009   Depression with anxiety 07/04/2007   MULTIPLE SCLEROSIS 07/04/2007  OA (osteoarthritis) of knee 07/04/2007   HEADACHE 07/04/2007   COLONIC POLYPS, HX OF 07/04/2007    PCP:  Thana Ates, MD       REFERRING PROVIDER:  Tama Gander, MD     REFERRING DIAG: M41.50 (ICD-10-CM) - Other secondary scoliosis, site unspecified   Rationale for Evaluation and Treatment: Rehabilitation  THERAPY DIAG:  Posture abnormality  Difficulty walking  ONSET DATE: 2023  SUBJECTIVE:                                                                                                                                                                                           SUBJECTIVE STATEMENT:  Pt reports muscle soreness since stating PT. Denies pain in back but has some in L shoulder which she thinks may be from doorway stretch. Has nerve block for L knee in the next 2 weeks.   PERTINENT HISTORY:  2/23 fusion of T7-S1 for  scoliosis; L knee surgery medial condyle excision for osteochondritis desiccans; L meniscus tear; MS relapsing remitting; history of vertigo  PAIN:  Are you having pain? No  PRECAUTIONS: None   WEIGHT BEARING RESTRICTIONS: No   FALLS:  Has patient fallen in last 6 months? No   LIVING ENVIRONMENT: Lives with: lives alone Lives in: House/apartment Stairs: No Has following equipment at home: [facility] and walker, does not use either    OCCUPATION: ICU nurse, retired    PLOF: Independent with basic ADLs  PATIENT GOALS: prolonging surgery   OBJECTIVE:   DIAGNOSTIC FINDINGS:  N/A recent  PATIENT SURVEYS:  FOTO 55 54 @ DC  SCREENING FOR RED FLAGS: Bowel or bladder incontinence: No Spinal tumors: No Cauda equina syndrome: No Compression fracture: No Abdominal aneurysm: No  COGNITION: Overall cognitive status: Within functional limits for tasks assessed     SENSATION: WFL  MUSCLE LENGTH: Hamstrings:   POSTURE: rounded shoulders, forward head, and increased thoracic kyphosis  PALPATION: TTP of bilat thoracic parapsinals in T4-6 region, rhomboids, lats  LUMBAR ROM: thoracolumbar ROM limited as expected with T7-S1 fusion  Difficulty getting into T/S extension in standing; able to once combined with scapular retraction  CERVICAL ROM:   Active ROM A/PROM (deg) eval  Flexion 80  Extension 60  Right lateral flexion 35  Left lateral flexion 35  Right rotation 75  Left rotation 75   (Blank rows = not tested)   MMT Right 6/28  Left 6/28  Shoulder flexion 4+/5 4+/5  Shoulder extension    Shoulder abduction 4+/5 4+/5  Shoulder adduction    Shoulder internal rotation 4+/5 4+/5  Shoulder external rotation 4/5 4/5  (Blank rows =  not tested)   GAIT: Distance walked: 62ft Assistive device utilized: None Level of assistance: Complete Independence Comments: WFL  TODAY'S TREATMENT:                                                                                                                                DATE: 7/2  -Review of doorway stretch -Instruction in SCM stretch -supine horizontal abduction YTB 2x10 -Supine bilateral ER YTB 2x10 -Supine chin tucks-5" x10 -rhomboid stretch at doorway 3x20sec -lat stretching (seemed to bother shoulder-hold) -sidelying open book stretch (instructed for HEP) -lat pull down at cable column (standing with short bar) 10lbs x10 -row machine seated with legs on floor- 5lbs 2x10 Bilateral ER against wall (open and close-goalpost position) x10  DATE: 6/28  Exercises - Seated Cervical Retraction  - 3-4 x daily - 7 x weekly - 1 sets - 10 reps - Shoulder External Rotation and Scapular Retraction with Resistance  - 3-4 x daily - 7 x weekly - 1 sets - 10 reps - Single Arm Doorway Pec Stretch at 90 Degrees Abduction  - 2 x daily - 7 x weekly - 1 sets - 3 reps - 30 hold - Seated Row Cable Machine  - 1 x daily - 2-3 x weekly - 2 sets - 10 reps     PATIENT EDUCATION:  Education details: MOI, diagnosis, prognosis, anatomy, exercise progression, DOMS expectations, muscle firing,  envelope of function, HEP, POC  Person educated: Patient Education method: Explanation, Demonstration, Tactile cues, Verbal cues, and Handouts Education comprehension: verbalized understanding, returned demonstration, verbal cues required, and tactile cues required  HOME EXERCISE PROGRAM: Access Code: [email or web address] URL: [email or web address] Date: 09/03/2022 Prepared by: Zebedee Iba  ASSESSMENT:  CLINICAL IMPRESSION: Patient instructed in sidelying open book stretch to perform as doorway stretch irritated her shoulder. Pt has tenderness and pain in anterior shoulder and biceps. Instructed her to rest and avoid lifting/reaching. She can also perform gentle massage with opposite hand, but no theracane. Good tolerance to postural strengthening and re-ed. Will monitor response to exercises and progress as tolerated.  OBJECTIVE  IMPAIRMENTS decreased mobility, difficulty walking, decreased ROM, decreased strength, increased muscle spasms, improper body mechanics, postural dysfunction, and pain.    ACTIVITY LIMITATIONS lifting, sitting, standing, squatting, stairs, transfers, locomotion level   PARTICIPATION LIMITATIONS: cleaning, laundry, driving, shopping, community activity, occupation, yard work, and exercise   PERSONAL FACTORS Age, Fitness, Time since onset of injury/illness/exacerbation, and 2+ comorbidity:    are also affecting patient's functional outcome.    REHAB POTENTIAL: Good   CLINICAL DECISION MAKING: unstable/complicated   EVALUATION COMPLEXITY: Moderate     GOALS:     SHORT TERM GOALS: Target date: 10/15/2022      Pt will become independent with HEP in order to demonstrate synthesis of PT education..   Goal status: INITIAL   2.  Pt will score at least 1 pt increase on FOTO to demonstrate functional improvement in MCII and  pt perceived function.      Goal status: INITIAL   3.  Pt will be able to demonstrate/report ability to sit/stand/sleep for extended periods of time without neck or shoulder discomfort in order to demonstrate functional improvement and tolerance to static positioning.    Goal status: INITIAL     LONG TERM GOALS: Target date: 11/26/2022    Pt  will become independent with final HEP in order to demonstrate synthesis of PT education.   Goal status: INITIAL   2.  Pt will score >/= 55 on FOTO to demonstrate improvement in perceived bilat knee function.    Goal status: INITIAL   3.  Pt will be able to demonstrate ability to maintain upright posture and alignment during all exercise in order to demonstrate functional improvement in postural awareness    Goal status: INITIAL          PLAN: PT FREQUENCY: 1-2x/week   PT DURATION: 12 weeks (likely DC in 6)   PLANNED INTERVENTIONS: Therapeutic exercises, Therapeutic activity, Neuromuscular re-education, Balance  training, Gait training, Patient/Family education, Self Care, and Joint mobilization   PLAN FOR NEXT SESSION: anterior stretching, postural strengthening, integration into gym based routine, modified T/S extension     Donnel Saxon Elyza Whitt, PTA 09/07/2022, 5:15 PM

## 2022-09-10 ENCOUNTER — Other Ambulatory Visit: Payer: Self-pay | Admitting: Neurology

## 2022-09-13 ENCOUNTER — Other Ambulatory Visit: Payer: Self-pay

## 2022-09-13 MED ORDER — AMPHETAMINE-DEXTROAMPHETAMINE 20 MG PO TABS: 20.0000 mg | ORAL_TABLET | Freq: Two times a day (BID) | ORAL | 0 refills | Status: DC

## 2022-09-13 NOTE — Telephone Encounter (Signed)
Patient last seen: 08/18/22 Follow up scheduled: 02/23/23 Last filled : 08/11/22 #60 tablets (30 day supply) Rx pending to be signed

## 2022-09-14 ENCOUNTER — Other Ambulatory Visit: Payer: Self-pay | Admitting: Neurology

## 2022-09-14 DIAGNOSIS — G35 Multiple sclerosis: Secondary | ICD-10-CM

## 2022-09-14 NOTE — Telephone Encounter (Signed)
Last seen on 08/18/22 per note "Continue leflunomide for MS " Follow up scheduled on 02/23/23 Last filled on 09/02/22 #30 tablets (30 day supply)

## 2022-09-18 ENCOUNTER — Ambulatory Visit (HOSPITAL_BASED_OUTPATIENT_CLINIC_OR_DEPARTMENT_OTHER): Payer: Medicare Other

## 2022-09-18 ENCOUNTER — Encounter (HOSPITAL_BASED_OUTPATIENT_CLINIC_OR_DEPARTMENT_OTHER): Payer: Self-pay

## 2022-09-18 DIAGNOSIS — R262 Difficulty in walking, not elsewhere classified: Secondary | ICD-10-CM | POA: Diagnosis not present

## 2022-09-18 DIAGNOSIS — R293 Abnormal posture: Secondary | ICD-10-CM | POA: Diagnosis not present

## 2022-09-18 NOTE — Therapy (Signed)
OUTPATIENT PHYSICAL THERAPY THORACOLUMBAR TREATMENT   Patient Name: Sheri Burns MRN: [id number] DOB:06/23/1955, 67 y.o., female Today's Date: 09/18/2022  END OF SESSION:  PT End of Session - 09/18/22 1039     Visit Number 3    Number of Visits 16    Date for PT Re-Evaluation 12/02/22    Authorization Type BCBS MCR    PT Start Time 1048    PT Stop Time 1128    PT Time Calculation (min) 40 min    Activity Tolerance Patient tolerated treatment well    Behavior During Therapy WFL for tasks assessed/performed               Past Medical History:  Diagnosis Date   Anxiety    Bulging lumbar disc 1980   Chronic pansinusitis    Colon polyp    Depression    GERD (gastroesophageal reflux disease)    Gynecological examination    sees Dr. Aram Beecham Romine    Headache(784.0)    Hypertension    Multiple sclerosis (HCC) since 1992   Dr. Despina Arias in [facility]   Multiple thyroid nodules 1995   per pt. resolved on its own follwed by endo then released   Neurogenic bladder disorder 12/2009   w/ urge & stress inc. dx. Dr. McDiarmid   Neuromuscular disorder [facility])    Multiple sclerosis   Neuropathy    Osteoarthritis    Osteoporosis    last DEXA 2018   Overactive bladder    sees Dr. Lorin Picket McDiarmid    Pancreatitis 1977   Pneumonia    Ulcerative proctitis (HCC)    Vision abnormalities    Past Surgical History:  Procedure Laterality Date   APPENDECTOMY     breast ruptured  left   ductal cyst   CARPAL TUNNEL RELEASE Right    CHOLECYSTECTOMY     COLONOSCOPY  01/10/09   repeat in 5 yrs Dr. Charlott Rakes   CYSTOSCOPY WITH BIOPSY N/A 01/06/2022   Procedure: CYSTOSCOPY WITH BLADDER/URETHRAL BIOPSY;  Surgeon: Sebastian Ache, MD;  Location: [facility];  Service: Urology;  Laterality: N/A;  45 MINS   DILATION AND CURETTAGE OF UTERUS     LAPAROSCOPY     x 3   left knee surgery  1978   to remove bone tumor     NASAL SINUS SURGERY Bilateral 01/13/2015   Procedure:  ENDOSCOPIC SINUS SURGERY;  Surgeon: Serena Colonel, MD;  Location: [facility];  Service: ENT;  Laterality: Bilateral;   NASAL SINUS SURGERY  05/2017   NASAL TURBINATE REDUCTION     pubo vaginal sling  10/2003   TONSILLECTOMY     vaginal cystocele repair     anterior   VAGINAL HYSTERECTOMY  1995   AUB & Endometriosis   widom teeth removal     Patient Active Problem List   Diagnosis Date Noted   Vertigo 08/05/2021   Chronic prescription opiate use 12/14/2018   Carpal tunnel syndrome, bilateral 09/13/2017   Sacroiliac joint pain 09/13/2017   Urinary urgency 06/22/2017   Sciatica, right side 06/17/2016   Dysesthesia 10/30/2015   Right carpal tunnel syndrome 10/30/2015   Sciatica of left side 07/16/2015   Chronic ethmoidal sinusitis 06/26/2015   High risk medication use 07/08/2014   Chronic fatigue 04/02/2014   Dysphagia 04/02/2014   Ataxic gait 04/02/2014   Vitamin D deficiency 06/08/2012   OVERACTIVE BLADDER 08/01/2009   Osteoporosis 08/01/2009   Depression with anxiety 07/04/2007   MULTIPLE SCLEROSIS 07/04/2007  OA (osteoarthritis) of knee 07/04/2007   HEADACHE 07/04/2007   COLONIC POLYPS, HX OF 07/04/2007    PCP:  Thana Ates, MD       REFERRING PROVIDER:  Tama Gander, MD     REFERRING DIAG: M41.50 (ICD-10-CM) - Other secondary scoliosis, site unspecified   Rationale for Evaluation and Treatment: Rehabilitation  THERAPY DIAG:  Posture abnormality  Difficulty walking  ONSET DATE: 2023  SUBJECTIVE:                                                                                                                                                                                           SUBJECTIVE STATEMENT:  Pt reports she has been diligent about her HEP. States she has muscle soreness from exercises, but denies pain. Had injections into L knee which she states did not help. "He will probably do an MRI when I see him in 2 weeks.   PERTINENT  HISTORY:  2/23 fusion of T7-S1 for scoliosis; L knee surgery medial condyle excision for osteochondritis desiccans; L meniscus tear; MS relapsing remitting; history of vertigo  PAIN:  Are you having pain? No  PRECAUTIONS: None   WEIGHT BEARING RESTRICTIONS: No   FALLS:  Has patient fallen in last 6 months? No   LIVING ENVIRONMENT: Lives with: lives alone Lives in: House/apartment Stairs: No Has following equipment at home: [facility] and walker, does not use either    OCCUPATION: ICU nurse, retired    PLOF: Independent with basic ADLs  PATIENT GOALS: prolonging surgery   OBJECTIVE:   DIAGNOSTIC FINDINGS:  N/A recent  PATIENT SURVEYS:  FOTO 55 54 @ DC  SCREENING FOR RED FLAGS: Bowel or bladder incontinence: No Spinal tumors: No Cauda equina syndrome: No Compression fracture: No Abdominal aneurysm: No  COGNITION: Overall cognitive status: Within functional limits for tasks assessed     SENSATION: WFL  MUSCLE LENGTH: Hamstrings:   POSTURE: rounded shoulders, forward head, and increased thoracic kyphosis  PALPATION: TTP of bilat thoracic parapsinals in T4-6 region, rhomboids, lats  LUMBAR ROM: thoracolumbar ROM limited as expected with T7-S1 fusion  Difficulty getting into T/S extension in standing; able to once combined with scapular retraction  CERVICAL ROM:   Active ROM A/PROM (deg) eval  Flexion 80  Extension 60  Right lateral flexion 35  Left lateral flexion 35  Right rotation 75  Left rotation 75   (Blank rows = not tested)   MMT Right 6/28  Left 6/28  Shoulder flexion 4+/5 4+/5  Shoulder extension    Shoulder abduction 4+/5 4+/5  Shoulder adduction    Shoulder internal rotation 4+/5 4+/5  Shoulder  external rotation 4/5 4/5  (Blank rows = not tested)   GAIT: Distance walked: 7ft Assistive device utilized: None Level of assistance: Complete Independence Comments: WFL  TODAY'S TREATMENT:                                                                                                                                DATE: 7/13  -STM to L periscpa mm/mid thoracic ps, R mid rib cage region -supine horizontal abduction YTB 2x10 -Supine bilateral ER YTB 2x10 -Supine chin tucks-5" x10 -Supine shoulder flexion x10ea with 5" hold at end range for lat stretch -lat pull down at cable column (standing with short bar) 10lbs x10 -[employer] row machine seated- 15lbs 2x10 Bilateral ER against wall (open and close-goalpost position) x10 Reverse wall push up 1x10   DATE: 7/2  -Review of doorway stretch -Instruction in SCM stretch -supine horizontal abduction YTB 2x10 -Supine bilateral ER YTB 2x10 -Supine chin tucks-5" x10 -rhomboid stretch at doorway 3x20sec -lat stretching (seemed to bother shoulder-hold) -sidelying open book stretch (instructed for HEP) -lat pull down at cable column (standing with short bar) 10lbs x10 -row machine seated with legs on floor- 5lbs 2x10 Bilateral ER against wall (open and close-goalpost position) x10  DATE: 6/28  Exercises - Seated Cervical Retraction  - 3-4 x daily - 7 x weekly - 1 sets - 10 reps - Shoulder External Rotation and Scapular Retraction with Resistance  - 3-4 x daily - 7 x weekly - 1 sets - 10 reps - Single Arm Doorway Pec Stretch at 90 Degrees Abduction  - 2 x daily - 7 x weekly - 1 sets - 3 reps - 30 hold - Seated Row Cable Machine  - 1 x daily - 2-3 x weekly - 2 sets - 10 reps     PATIENT EDUCATION:  Education details: MOI, diagnosis, prognosis, anatomy, exercise progression, DOMS expectations, muscle firing,  envelope of function, HEP, POC  Person educated: Patient Education method: Explanation, Demonstration, Tactile cues, Verbal cues, and Handouts Education comprehension: verbalized understanding, returned demonstration, verbal cues required, and tactile cues required  HOME EXERCISE PROGRAM: Access Code: [email or web address] URL: [email or web address] Date:  09/03/2022 Prepared by: Zebedee Iba  ASSESSMENT:  CLINICAL IMPRESSION: Pt is progressing well thus far and has improved in her postural awareness. She is dealing with muscular soreness/tightness from initiation of PT. Will monitor soreness throughout POC. Performed STM to L periscapular mm which pt reported tenderness with. She did have relief of tightness following this. Pt able to complete shoulder flexion in supine without discomfort bilaterally, but does report strong stretch in lats with this. No complaints of pain with scapular strengthening.   OBJECTIVE IMPAIRMENTS decreased mobility, difficulty walking, decreased ROM, decreased strength, increased muscle spasms, improper body mechanics, postural dysfunction, and pain.    ACTIVITY LIMITATIONS lifting, sitting, standing, squatting, stairs, transfers, locomotion level   PARTICIPATION LIMITATIONS: cleaning, laundry, driving, shopping, community activity, occupation, yard work, and exercise  PERSONAL FACTORS Age, Fitness, Time since onset of injury/illness/exacerbation, and 2+ comorbidity:    are also affecting patient's functional outcome.    REHAB POTENTIAL: Good   CLINICAL DECISION MAKING: unstable/complicated   EVALUATION COMPLEXITY: Moderate     GOALS:     SHORT TERM GOALS: Target date: 10/15/2022      Pt will become independent with HEP in order to demonstrate synthesis of PT education..   Goal status: MET 7/13   2.  Pt will score at least 1 pt increase on FOTO to demonstrate functional improvement in MCII and pt perceived function.      Goal status: INITIAL   3.  Pt will be able to demonstrate/report ability to sit/stand/sleep for extended periods of time without neck or shoulder discomfort in order to demonstrate functional improvement and tolerance to static positioning.    Goal status: INITIAL     LONG TERM GOALS: Target date: 11/26/2022    Pt  will become independent with final HEP in order to demonstrate  synthesis of PT education.   Goal status: INITIAL   2.  Pt will score >/= 55 on FOTO to demonstrate improvement in perceived bilat knee function.    Goal status: INITIAL   3.  Pt will be able to demonstrate ability to maintain upright posture and alignment during all exercise in order to demonstrate functional improvement in postural awareness    Goal status: INITIAL          PLAN: PT FREQUENCY: 1-2x/week   PT DURATION: 12 weeks (likely DC in 6)   PLANNED INTERVENTIONS: Therapeutic exercises, Therapeutic activity, Neuromuscular re-education, Balance training, Gait training, Patient/Family education, Self Care, and Joint mobilization   PLAN FOR NEXT SESSION: anterior stretching, postural strengthening, integration into gym based routine, modified T/S extension     Donnel Saxon Theordore Cisnero, PTA 09/18/2022, 12:20 PM

## 2022-09-20 DIAGNOSIS — K080 Exfoliation of teeth due to systemic causes: Secondary | ICD-10-CM | POA: Diagnosis not present

## 2022-09-25 ENCOUNTER — Ambulatory Visit (HOSPITAL_BASED_OUTPATIENT_CLINIC_OR_DEPARTMENT_OTHER): Payer: Medicare Other

## 2022-09-25 ENCOUNTER — Encounter (HOSPITAL_BASED_OUTPATIENT_CLINIC_OR_DEPARTMENT_OTHER): Payer: Self-pay

## 2022-09-25 DIAGNOSIS — R293 Abnormal posture: Secondary | ICD-10-CM | POA: Diagnosis not present

## 2022-09-25 DIAGNOSIS — R262 Difficulty in walking, not elsewhere classified: Secondary | ICD-10-CM

## 2022-09-25 NOTE — Therapy (Signed)
OUTPATIENT PHYSICAL THERAPY THORACOLUMBAR TREATMENT   Patient Name: Sheri Burns MRN: [id number] DOB:[birth year], 67 y.o., female Today's Date: 09/25/2022  END OF SESSION:  PT End of Session - 09/25/22 0910     Visit Number 4    Number of Visits 16    Date for PT Re-Evaluation 12/02/22    Authorization Type BCBS MCR    PT Start Time 0917    PT Stop Time 1000    PT Time Calculation (min) 43 min    Activity Tolerance Patient tolerated treatment well    Behavior During Therapy WFL for tasks assessed/performed                Past Medical History:  Diagnosis Date   Anxiety    Bulging lumbar disc 1980   Chronic pansinusitis    Colon polyp    Depression    GERD (gastroesophageal reflux disease)    Gynecological examination    sees Dr. Aram Beecham Romine    Headache(784.0)    Hypertension    Multiple sclerosis (HCC) since 1992   Dr. Despina Arias in [facility]   Multiple thyroid nodules 1995   per pt. resolved on its own follwed by endo then released   Neurogenic bladder disorder 12/2009   w/ urge [facility] stress [facility]. dx. Dr. McDiarmid   Neuromuscular disorder [facility])    Multiple sclerosis   Neuropathy    Osteoarthritis    Osteoporosis    last DEXA 2018   Overactive bladder    sees Dr. Lorin Picket McDiarmid    Pancreatitis 1977   Pneumonia    Ulcerative proctitis (HCC)    Vision abnormalities    Past Surgical History:  Procedure Laterality Date   APPENDECTOMY     breast ruptured  left   ductal cyst   CARPAL TUNNEL RELEASE Right    CHOLECYSTECTOMY     COLONOSCOPY  01/10/09   repeat in 5 yrs Dr. Charlott Rakes   CYSTOSCOPY WITH BIOPSY N/A 01/06/2022   Procedure: CYSTOSCOPY WITH BLADDER/URETHRAL BIOPSY;  Surgeon: Sebastian Ache, MD;  Location: [facility];  Service: Urology;  Laterality: N/A;  45 MINS   DILATION AND CURETTAGE OF UTERUS     LAPAROSCOPY     x 3   left knee surgery  1978   to remove bone tumor     NASAL SINUS SURGERY Bilateral 01/13/2015   Procedure:  ENDOSCOPIC SINUS SURGERY;  Surgeon: Serena Colonel, MD;  Location: [facility];  Service: ENT;  Laterality: Bilateral;   NASAL SINUS SURGERY  05/2017   NASAL TURBINATE REDUCTION     pubo vaginal sling  10/2003   TONSILLECTOMY     vaginal cystocele repair     anterior   VAGINAL HYSTERECTOMY  1995   AUB & Endometriosis   widom teeth removal     Patient Active Problem List   Diagnosis Date Noted   Vertigo 08/05/2021   Chronic prescription opiate use 12/14/2018   Carpal tunnel syndrome, bilateral 09/13/2017   Sacroiliac joint pain 09/13/2017   Urinary urgency 06/22/2017   Sciatica, right side 06/17/2016   Dysesthesia 10/30/2015   Right carpal tunnel syndrome 10/30/2015   Sciatica of left side 07/16/2015   Chronic ethmoidal sinusitis 06/26/2015   High risk medication use 07/08/2014   Chronic fatigue 04/02/2014   Dysphagia 04/02/2014   Ataxic gait 04/02/2014   Vitamin D deficiency 06/08/2012   OVERACTIVE BLADDER 08/01/2009   Osteoporosis 08/01/2009   Depression with anxiety 07/04/2007   MULTIPLE SCLEROSIS  07/04/2007   OA (osteoarthritis) of knee 07/04/2007   HEADACHE 07/04/2007   COLONIC POLYPS, HX OF 07/04/2007    PCP:  Thana Ates, MD       REFERRING PROVIDER:  Tama Gander, MD     REFERRING DIAG: M41.50 (ICD-10-CM) - Other secondary scoliosis, site unspecified   Rationale for Evaluation and Treatment: Rehabilitation  THERAPY DIAG:  Posture abnormality  Difficulty walking  ONSET DATE: 2023  SUBJECTIVE:                                                                                                                                                                                           SUBJECTIVE STATEMENT:  Pt reports some muscle soreness in shoulder, but denies pain.   PERTINENT HISTORY:  2/23 fusion of T7-S1 for scoliosis; L knee surgery medial condyle excision for osteochondritis desiccans; L meniscus tear; MS relapsing remitting; history  of vertigo  PAIN:  Are you having pain? No  PRECAUTIONS: None   WEIGHT BEARING RESTRICTIONS: No   FALLS:  Has patient fallen in last 6 months? No   LIVING ENVIRONMENT: Lives with: lives alone Lives in: House/apartment Stairs: No Has following equipment at home: [place] and walker, does not use either    OCCUPATION: ICU nurse, retired    PLOF: Independent with basic ADLs  PATIENT GOALS: prolonging surgery   OBJECTIVE:   DIAGNOSTIC FINDINGS:  N/A recent  PATIENT SURVEYS:  FOTO 55 54 @ DC  SCREENING FOR RED FLAGS: Bowel or bladder incontinence: No Spinal tumors: No Cauda equina syndrome: No Compression fracture: No Abdominal aneurysm: No  COGNITION: Overall cognitive status: Within functional limits for tasks assessed     SENSATION: WFL  MUSCLE LENGTH: Hamstrings:   POSTURE: rounded shoulders, forward head, and increased thoracic kyphosis  PALPATION: TTP of bilat thoracic parapsinals in T4-6 region, rhomboids, lats  LUMBAR ROM: thoracolumbar ROM limited as expected with T7-S1 fusion  Difficulty getting into T/S extension in standing; able to once combined with scapular retraction  CERVICAL ROM:   Active ROM A/PROM (deg) eval  Flexion 80  Extension 60  Right lateral flexion 35  Left lateral flexion 35  Right rotation 75  Left rotation 75   (Blank rows = not tested)   MMT Right 6/28  Left 6/28  Shoulder flexion 4+/5 4+/5  Shoulder extension    Shoulder abduction 4+/5 4+/5  Shoulder adduction    Shoulder internal rotation 4+/5 4+/5  Shoulder external rotation 4/5 4/5  (Blank rows = not tested)   GAIT: Distance walked: 7ft Assistive device utilized: None Level of assistance: Complete Independence Comments: WFL  TODAY'S TREATMENT:  DATE: 7/20  -STM to L periscpa mm/mid thoracic ps, L/R mid rib cage region -supine  horizontal abduction RTB 2x10 -Supine bilateral ER RTB 2x10 -Supine shoulder flexion x10ea with 5" hold at end range for lat stretch Sidelying pec stretch 3x30s L Bilateral ER against wall (open and close-goalpost position) 2x10 Facing wall with shoulders OH- lift off bilaterally x10ea Scapular stability ball rolls 4 way 2.2# [id number]   DATE: 7/13  -STM to L periscpa mm/mid thoracic ps, R mid rib cage region -supine horizontal abduction YTB 2x10 -Supine bilateral ER YTB 2x10 -Supine chin tucks-5" x10 -Supine shoulder flexion x10ea with 5" hold at end range for lat stretch -lat pull down at cable column (standing with short bar) 10lbs x10 -life fitness row machine seated- 15lbs 2x10 Bilateral ER against wall (open and close-goalpost position) x10 Reverse wall push up 1x10   DATE: 7/2  -Review of doorway stretch -Instruction in SCM stretch -supine horizontal abduction YTB 2x10 -Supine bilateral ER YTB 2x10 -Supine chin tucks-5" x10 -rhomboid stretch at doorway 3x20sec -lat stretching (seemed to bother shoulder-hold) -sidelying open book stretch (instructed for HEP) -lat pull down at cable column (standing with short bar) 10lbs x10 -row machine seated with legs on floor- 5lbs 2x10 Bilateral ER against wall (open and close-goalpost position) x10  DATE: 6/28  Exercises - Seated Cervical Retraction  - 3-4 x daily - 7 x weekly - 1 sets - 10 reps - Shoulder External Rotation and Scapular Retraction with Resistance  - 3-4 x daily - 7 x weekly - 1 sets - 10 reps - Single Arm Doorway Pec Stretch at 90 Degrees Abduction  - 2 x daily - 7 x weekly - 1 sets - 3 reps - 30 hold - Seated Row Cable Machine  - 1 x daily - 2-3 x weekly - 2 sets - 10 reps     PATIENT EDUCATION:  Education details: MOI, diagnosis, prognosis, anatomy, exercise progression, DOMS expectations, muscle firing,  envelope of function, HEP, POC  Person educated: Patient Education method: Explanation, Demonstration,  Tactile cues, Verbal cues, and Handouts Education comprehension: verbalized understanding, returned demonstration, verbal cues required, and tactile cues required  HOME EXERCISE PROGRAM: Access Code: [email or web address] URL: [email or web address] Date: 09/03/2022 Prepared by: Zebedee Iba  ASSESSMENT:  CLINICAL IMPRESSION: Pt continues with soft tissue dysfunction and restriction in bilateral thoracic are and periscapular mm. She is tender to palpation throughout these areas. Good tolerance for progressions to scapular stabilization and strengthening. Will continue to progress as tolerated.   OBJECTIVE IMPAIRMENTS decreased mobility, difficulty walking, decreased ROM, decreased strength, increased muscle spasms, improper body mechanics, postural dysfunction, and pain.    ACTIVITY LIMITATIONS lifting, sitting, standing, squatting, stairs, transfers, locomotion level   PARTICIPATION LIMITATIONS: cleaning, laundry, driving, shopping, community activity, occupation, yard work, and exercise   PERSONAL FACTORS Age, Fitness, Time since onset of injury/illness/exacerbation, and 2+ comorbidity:    are also affecting patient's functional outcome.    REHAB POTENTIAL: Good   CLINICAL DECISION MAKING: unstable/complicated   EVALUATION COMPLEXITY: Moderate     GOALS:     SHORT TERM GOALS: Target date: 10/15/2022      Pt will become independent with HEP in order to demonstrate synthesis of PT education..   Goal status: MET 7/13   2.  Pt will score at least 1 pt increase on FOTO to demonstrate functional improvement in MCII and pt perceived function.      Goal status: INITIAL   3.  Pt will be able to  demonstrate/report ability to sit/stand/sleep for extended periods of time without neck or shoulder discomfort in order to demonstrate functional improvement and tolerance to static positioning.    Goal status: INITIAL     LONG TERM GOALS: Target date: 11/26/2022    Pt  will become  independent with final HEP in order to demonstrate synthesis of PT education.   Goal status: INITIAL   2.  Pt will score >/= 55 on FOTO to demonstrate improvement in perceived bilat knee function.    Goal status: INITIAL   3.  Pt will be able to demonstrate ability to maintain upright posture and alignment during all exercise in order to demonstrate functional improvement in postural awareness    Goal status: INITIAL          PLAN: PT FREQUENCY: 1-2x/week   PT DURATION: 12 weeks (likely DC in 6)   PLANNED INTERVENTIONS: Therapeutic exercises, Therapeutic activity, Neuromuscular re-education, Balance training, Gait training, Patient/Family education, Self Care, and Joint mobilization   PLAN FOR NEXT SESSION: anterior stretching, postural strengthening, integration into gym based routine, modified T/S extension     Donnel Saxon Deaundre Allston, PTA 09/25/2022, 10:03 AM

## 2022-09-29 ENCOUNTER — Ambulatory Visit (HOSPITAL_BASED_OUTPATIENT_CLINIC_OR_DEPARTMENT_OTHER): Payer: Medicare Other

## 2022-09-29 ENCOUNTER — Encounter (HOSPITAL_BASED_OUTPATIENT_CLINIC_OR_DEPARTMENT_OTHER): Payer: Self-pay

## 2022-09-29 DIAGNOSIS — R262 Difficulty in walking, not elsewhere classified: Secondary | ICD-10-CM | POA: Diagnosis not present

## 2022-09-29 DIAGNOSIS — R293 Abnormal posture: Secondary | ICD-10-CM

## 2022-09-29 NOTE — Therapy (Signed)
OUTPATIENT PHYSICAL THERAPY THORACOLUMBAR TREATMENT   Patient Name: Sheri Burns MRN: [id number] DOB:[birth year], 67 y.o., female Today's Date: 09/29/2022  END OF SESSION:  PT End of Session - 09/29/22 1346     Visit Number 5    Number of Visits 16    Date for PT Re-Evaluation 12/02/22    Authorization Type BCBS MCR    PT Start Time 1345    PT Stop Time 1428    PT Time Calculation (min) 43 min    Activity Tolerance Patient tolerated treatment well    Behavior During Therapy WFL for tasks assessed/performed                 Past Medical History:  Diagnosis Date   Anxiety    Bulging lumbar disc 1980   Chronic pansinusitis    Colon polyp    Depression    GERD (gastroesophageal reflux disease)    Gynecological examination    sees Dr. Aram Beecham Romine    Headache(784.0)    Hypertension    Multiple sclerosis (HCC) since 1992   Dr. Despina Arias in [facility]   Multiple thyroid nodules 1995   per pt. resolved on its own follwed by endo then released   Neurogenic bladder disorder 12/2009   w/ urge [facility] stress [facility] dx. Dr. McDiarmid   Neuromuscular disorder [facility])    Multiple sclerosis   Neuropathy    Osteoarthritis    Osteoporosis    last DEXA 2018   Overactive bladder    sees Dr. Lorin Picket McDiarmid    Pancreatitis 1977   Pneumonia    Ulcerative proctitis (HCC)    Vision abnormalities    Past Surgical History:  Procedure Laterality Date   APPENDECTOMY     breast ruptured  left   ductal cyst   CARPAL TUNNEL RELEASE Right    CHOLECYSTECTOMY     COLONOSCOPY  01/10/09   repeat in 5 yrs Dr. Charlott Rakes   CYSTOSCOPY WITH BIOPSY N/A 01/06/2022   Procedure: CYSTOSCOPY WITH BLADDER/URETHRAL BIOPSY;  Surgeon: Sebastian Ache, MD;  Location: [facility];  Service: Urology;  Laterality: N/A;  45 MINS   DILATION AND CURETTAGE OF UTERUS     LAPAROSCOPY     x 3   left knee surgery  1978   to remove bone tumor     NASAL SINUS SURGERY Bilateral 01/13/2015   Procedure:  ENDOSCOPIC SINUS SURGERY;  Surgeon: Serena Colonel, MD;  Location: [facility];  Service: ENT;  Laterality: Bilateral;   NASAL SINUS SURGERY  05/2017   NASAL TURBINATE REDUCTION     pubo vaginal sling  10/2003   TONSILLECTOMY     vaginal cystocele repair     anterior   VAGINAL HYSTERECTOMY  1995   AUB & Endometriosis   widom teeth removal     Patient Active Problem List   Diagnosis Date Noted   Vertigo 08/05/2021   Chronic prescription opiate use 12/14/2018   Carpal tunnel syndrome, bilateral 09/13/2017   Sacroiliac joint pain 09/13/2017   Urinary urgency 06/22/2017   Sciatica, right side 06/17/2016   Dysesthesia 10/30/2015   Right carpal tunnel syndrome 10/30/2015   Sciatica of left side 07/16/2015   Chronic ethmoidal sinusitis 06/26/2015   High risk medication use 07/08/2014   Chronic fatigue 04/02/2014   Dysphagia 04/02/2014   Ataxic gait 04/02/2014   Vitamin D deficiency 06/08/2012   OVERACTIVE BLADDER 08/01/2009   Osteoporosis 08/01/2009   Depression with anxiety 07/04/2007   MULTIPLE  SCLEROSIS 07/04/2007   OA (osteoarthritis) of knee 07/04/2007   HEADACHE 07/04/2007   COLONIC POLYPS, HX OF 07/04/2007    PCP:  Thana Ates, MD       REFERRING PROVIDER:  Tama Gander, MD     REFERRING DIAG: M41.50 (ICD-10-CM) - Other secondary scoliosis, site unspecified   Rationale for Evaluation and Treatment: Rehabilitation  THERAPY DIAG:  Posture abnormality  Difficulty walking  ONSET DATE: 2023  SUBJECTIVE:                                                                                                                                                                                           SUBJECTIVE STATEMENT:  Pt again reports some muscle soreness in shoulder, but denies pain.   PERTINENT HISTORY:  2/23 fusion of T7-S1 for scoliosis; L knee surgery medial condyle excision for osteochondritis desiccans; L meniscus tear; MS relapsing remitting;  history of vertigo  PAIN:  Are you having pain? No  PRECAUTIONS: None   WEIGHT BEARING RESTRICTIONS: No   FALLS:  Has patient fallen in last 6 months? No   LIVING ENVIRONMENT: Lives with: lives alone Lives in: House/apartment Stairs: No Has following equipment at home: [facility] and walker, does not use either    OCCUPATION: ICU nurse, retired    PLOF: Independent with basic ADLs  PATIENT GOALS: prolonging surgery   OBJECTIVE:   DIAGNOSTIC FINDINGS:  N/A recent  PATIENT SURVEYS:  FOTO 55 54 @ DC  SCREENING FOR RED FLAGS: Bowel or bladder incontinence: No Spinal tumors: No Cauda equina syndrome: No Compression fracture: No Abdominal aneurysm: No  COGNITION: Overall cognitive status: Within functional limits for tasks assessed     SENSATION: WFL  MUSCLE LENGTH: Hamstrings:   POSTURE: rounded shoulders, forward head, and increased thoracic kyphosis  PALPATION: TTP of bilat thoracic parapsinals in T4-6 region, rhomboids, lats  LUMBAR ROM: thoracolumbar ROM limited as expected with T7-S1 fusion  Difficulty getting into T/S extension in standing; able to once combined with scapular retraction  CERVICAL ROM:   Active ROM A/PROM (deg) eval  Flexion 80  Extension 60  Right lateral flexion 35  Left lateral flexion 35  Right rotation 75  Left rotation 75   (Blank rows = not tested)   MMT Right 6/28  Left 6/28  Shoulder flexion 4+/5 4+/5  Shoulder extension    Shoulder abduction 4+/5 4+/5  Shoulder adduction    Shoulder internal rotation 4+/5 4+/5  Shoulder external rotation 4/5 4/5  (Blank rows = not tested)   GAIT: Distance walked: 68ft Assistive device utilized: None Level of assistance: Complete Independence Comments: [facility]  TODAY'S TREATMENT:                                                                                                                                DATE: 7/24  -UBE L1 4' alternating 1' fwd/1'back -STM to L periscpa  mm/mid thoracic ps, L/R mid rib cage region -supine horizontal abduction RTB 2x10 -Supine bilateral ER RTB 2x10 -Supine shoulder flexion x10ea with 5" hold at end range for lat stretch Sidelying pec stretch 5x10s L Bilateral ER against wall (open and close-goalpost position) 2x10 Facing wall with shoulders OH- lift off bilaterally 2x10 Scapular stability ball rolls 4 way 2.2# [id number]  DATE: 7/20  -STM to L periscpa mm/mid thoracic ps, L/R mid rib cage region -supine horizontal abduction RTB 2x10 -Supine bilateral ER RTB 2x10 -Supine shoulder flexion x10ea with 5" hold at end range for lat stretch Sidelying pec stretch 3x30s L Bilateral ER against wall (open and close-goalpost position) 2x10 Facing wall with shoulders OH- lift off bilaterally x10ea Scapular stability ball rolls 4 way 2.2# [id number]   DATE: 7/13  -STM to L periscpa mm/mid thoracic ps, R mid rib cage region -supine horizontal abduction YTB 2x10 -Supine bilateral ER YTB 2x10 -Supine chin tucks-5" x10 -Supine shoulder flexion x10ea with 5" hold at end range for lat stretch -lat pull down at cable column (standing with short bar) 10lbs x10 -life fitness row machine seated- 15lbs 2x10 Bilateral ER against wall (open and close-goalpost position) x10 Reverse wall push up 1x10   DATE: 7/2  -Review of doorway stretch -Instruction in SCM stretch -supine horizontal abduction YTB 2x10 -Supine bilateral ER YTB 2x10 -Supine chin tucks-5" x10 -rhomboid stretch at doorway 3x20sec -lat stretching (seemed to bother shoulder-hold) -sidelying open book stretch (instructed for HEP) -lat pull down at cable column (standing with short bar) 10lbs x10 -row machine seated with legs on floor- 5lbs 2x10 Bilateral ER against wall (open and close-goalpost position) x10  DATE: 6/28  Exercises - Seated Cervical Retraction  - 3-4 x daily - 7 x weekly - 1 sets - 10 reps - Shoulder External Rotation and Scapular Retraction with Resistance   - 3-4 x daily - 7 x weekly - 1 sets - 10 reps - Single Arm Doorway Pec Stretch at 90 Degrees Abduction  - 2 x daily - 7 x weekly - 1 sets - 3 reps - 30 hold - Seated Row Cable Machine  - 1 x daily - 2-3 x weekly - 2 sets - 10 reps     PATIENT EDUCATION:  Education details: MOI, diagnosis, prognosis, anatomy, exercise progression, DOMS expectations, muscle firing,  envelope of function, HEP, POC  Person educated: Patient Education method: Explanation, Demonstration, Tactile cues, Verbal cues, and Handouts Education comprehension: verbalized understanding, returned demonstration, verbal cues required, and tactile cues required  HOME EXERCISE PROGRAM: Access Code: [email or web address] URL: [email or web address] Date: 09/03/2022 Prepared by: Zebedee Iba  ASSESSMENT:  CLINICAL IMPRESSION: Pt reports improvement in  pain level since starting PT. She has good tolerance for progressions to postural re-ed and strengthening interventions. She does fatigue by end of session, but denies any increase in pain. Pt will benefit from continued postural re-ed and strengthening to meet further goals.   OBJECTIVE IMPAIRMENTS decreased mobility, difficulty walking, decreased ROM, decreased strength, increased muscle spasms, improper body mechanics, postural dysfunction, and pain.    ACTIVITY LIMITATIONS lifting, sitting, standing, squatting, stairs, transfers, locomotion level   PARTICIPATION LIMITATIONS: cleaning, laundry, driving, shopping, community activity, occupation, yard work, and exercise   PERSONAL FACTORS Age, Fitness, Time since onset of injury/illness/exacerbation, and 2+ comorbidity:    are also affecting patient's functional outcome.    REHAB POTENTIAL: Good   CLINICAL DECISION MAKING: unstable/complicated   EVALUATION COMPLEXITY: Moderate     GOALS:     SHORT TERM GOALS: Target date: 10/15/2022      Pt will become independent with HEP in order to demonstrate synthesis of PT  education..   Goal status: MET 7/13   2.  Pt will score at least 1 pt increase on FOTO to demonstrate functional improvement in MCII and pt perceived function.      Goal status: INITIAL   3.  Pt will be able to demonstrate/report ability to sit/stand/sleep for extended periods of time without neck or shoulder discomfort in order to demonstrate functional improvement and tolerance to static positioning.    Goal status: IN PROGRESS (less pain)      LONG TERM GOALS: Target date: 11/26/2022    Pt  will become independent with final HEP in order to demonstrate synthesis of PT education.   Goal status: INITIAL   2.  Pt will score >/= 55 on FOTO to demonstrate improvement in perceived bilat knee function.    Goal status: INITIAL   3.  Pt will be able to demonstrate ability to maintain upright posture and alignment during all exercise in order to demonstrate functional improvement in postural awareness    Goal status: INITIAL          PLAN: PT FREQUENCY: 1-2x/week   PT DURATION: 12 weeks (likely DC in 6)   PLANNED INTERVENTIONS: Therapeutic exercises, Therapeutic activity, Neuromuscular re-education, Balance training, Gait training, Patient/Family education, Self Care, and Joint mobilization   PLAN FOR NEXT SESSION: anterior stretching, postural strengthening, integration into gym based routine, modified T/S extension     Donnel Saxon Milani [place], PTA 09/29/2022, 4:29 PM

## 2022-10-01 ENCOUNTER — Other Ambulatory Visit: Payer: Self-pay | Admitting: Internal Medicine

## 2022-10-01 DIAGNOSIS — Z1231 Encounter for screening mammogram for malignant neoplasm of breast: Secondary | ICD-10-CM

## 2022-10-01 DIAGNOSIS — R140 Abdominal distension (gaseous): Secondary | ICD-10-CM | POA: Diagnosis not present

## 2022-10-01 DIAGNOSIS — G35 Multiple sclerosis: Secondary | ICD-10-CM | POA: Diagnosis not present

## 2022-10-01 DIAGNOSIS — K219 Gastro-esophageal reflux disease without esophagitis: Secondary | ICD-10-CM | POA: Diagnosis not present

## 2022-10-01 DIAGNOSIS — F325 Major depressive disorder, single episode, in full remission: Secondary | ICD-10-CM | POA: Diagnosis not present

## 2022-10-04 ENCOUNTER — Other Ambulatory Visit: Payer: Self-pay | Admitting: Physical Medicine and Rehabilitation

## 2022-10-04 DIAGNOSIS — M25562 Pain in left knee: Secondary | ICD-10-CM

## 2022-10-06 ENCOUNTER — Ambulatory Visit (HOSPITAL_BASED_OUTPATIENT_CLINIC_OR_DEPARTMENT_OTHER): Payer: Medicare Other | Admitting: Physical Therapy

## 2022-10-06 ENCOUNTER — Encounter (HOSPITAL_BASED_OUTPATIENT_CLINIC_OR_DEPARTMENT_OTHER): Payer: Self-pay | Admitting: Physical Therapy

## 2022-10-06 ENCOUNTER — Other Ambulatory Visit: Payer: Self-pay | Admitting: Neurology

## 2022-10-06 DIAGNOSIS — R262 Difficulty in walking, not elsewhere classified: Secondary | ICD-10-CM | POA: Diagnosis not present

## 2022-10-06 DIAGNOSIS — R293 Abnormal posture: Secondary | ICD-10-CM | POA: Diagnosis not present

## 2022-10-06 NOTE — Therapy (Signed)
OUTPATIENT PHYSICAL THERAPY THORACOLUMBAR TREATMENT   Patient Name: Sheri Burns MRN: [id number] DOB:02-Mar-1956, 67 y.o., female Today's Date: 10/06/2022  END OF SESSION:  PT End of Session - 10/06/22 1004     Visit Number 6    Number of Visits 16    Date for PT Re-Evaluation 12/02/22    Authorization Type BCBS MCR    PT Start Time 0931    PT Stop Time 1010    PT Time Calculation (min) 39 min    Activity Tolerance Patient tolerated treatment well    Behavior During Therapy WFL for tasks assessed/performed                  Past Medical History:  Diagnosis Date   Anxiety    Bulging lumbar disc 1980   Chronic pansinusitis    Colon polyp    Depression    GERD (gastroesophageal reflux disease)    Gynecological examination    sees Dr. Aram Beecham Romine    Headache(784.0)    Hypertension    Multiple sclerosis (HCC) since 1992   Dr. Despina Arias in [facility]   Multiple thyroid nodules 1995   per pt. resolved on its own follwed by endo then released   Neurogenic bladder disorder 12/2009   w/ urge & stress inc. dx. Dr. McDiarmid   Neuromuscular disorder [facility])    Multiple sclerosis   Neuropathy    Osteoarthritis    Osteoporosis    last DEXA 2018   Overactive bladder    sees Dr. Lorin Picket McDiarmid    Pancreatitis 1977   Pneumonia    Ulcerative proctitis (HCC)    Vision abnormalities    Past Surgical History:  Procedure Laterality Date   APPENDECTOMY     breast ruptured  left   ductal cyst   CARPAL TUNNEL RELEASE Right    CHOLECYSTECTOMY     COLONOSCOPY  01/10/09   repeat in 5 yrs Dr. Charlott Rakes   CYSTOSCOPY WITH BIOPSY N/A 01/06/2022   Procedure: CYSTOSCOPY WITH BLADDER/URETHRAL BIOPSY;  Surgeon: Sebastian Ache, MD;  Location: [facility];  Service: Urology;  Laterality: N/A;  45 MINS   DILATION AND CURETTAGE OF UTERUS     LAPAROSCOPY     x 3   left knee surgery  1978   to remove bone tumor     NASAL SINUS SURGERY Bilateral 01/13/2015    Procedure: ENDOSCOPIC SINUS SURGERY;  Surgeon: Serena Colonel, MD;  Location: [facility];  Service: ENT;  Laterality: Bilateral;   NASAL SINUS SURGERY  05/2017   NASAL TURBINATE REDUCTION     pubo vaginal sling  10/2003   TONSILLECTOMY     vaginal cystocele repair     anterior   VAGINAL HYSTERECTOMY  1995   AUB & Endometriosis   widom teeth removal     Patient Active Problem List   Diagnosis Date Noted   Vertigo 08/05/2021   Chronic prescription opiate use 12/14/2018   Carpal tunnel syndrome, bilateral 09/13/2017   Sacroiliac joint pain 09/13/2017   Urinary urgency 06/22/2017   Sciatica, right side 06/17/2016   Dysesthesia 10/30/2015   Right carpal tunnel syndrome 10/30/2015   Sciatica of left side 07/16/2015   Chronic ethmoidal sinusitis 06/26/2015   High risk medication use 07/08/2014   Chronic fatigue 04/02/2014   Dysphagia 04/02/2014   Ataxic gait 04/02/2014   Vitamin D deficiency 06/08/2012   OVERACTIVE BLADDER 08/01/2009   Osteoporosis 08/01/2009   Depression with anxiety 07/04/2007  MULTIPLE SCLEROSIS 07/04/2007   OA (osteoarthritis) of knee 07/04/2007   HEADACHE 07/04/2007   COLONIC POLYPS, HX OF 07/04/2007    PCP:  Thana Ates, MD       REFERRING PROVIDER:  Tama Gander, MD     REFERRING DIAG: M41.50 (ICD-10-CM) - Other secondary scoliosis, site unspecified   Rationale for Evaluation and Treatment: Rehabilitation  THERAPY DIAG:  Posture abnormality  Difficulty walking  ONSET DATE: 2023  SUBJECTIVE:                                                                                                                                                                                           SUBJECTIVE STATEMENT:  Pt states she has some soreness into the shoulders and back but no pain. Pt notes she is more aware of posture. Unfortunately, she is talking with physicians regarding L knee surgery due to the nerve ablation being ineffective.    PERTINENT HISTORY:  2/23 fusion of T7-S1 for scoliosis; L knee surgery medial condyle excision for osteochondritis desiccans; L meniscus tear; MS relapsing remitting; history of vertigo  PAIN:  Are you having pain? No  PRECAUTIONS: None   WEIGHT BEARING RESTRICTIONS: No   FALLS:  Has patient fallen in last 6 months? No   LIVING ENVIRONMENT: Lives with: lives alone Lives in: House/apartment Stairs: No Has following equipment at home: [facility] and walker, does not use either    OCCUPATION: ICU nurse, retired    PLOF: Independent with basic ADLs  PATIENT GOALS: prolonging surgery   OBJECTIVE:   DIAGNOSTIC FINDINGS:  N/A recent  PATIENT SURVEYS:  FOTO 55 54 @ DC  SCREENING FOR RED FLAGS: Bowel or bladder incontinence: No Spinal tumors: No Cauda equina syndrome: No Compression fracture: No Abdominal aneurysm: No  COGNITION: Overall cognitive status: Within functional limits for tasks assessed     SENSATION: WFL  MUSCLE LENGTH: Hamstrings:   POSTURE: rounded shoulders, forward head, and increased thoracic kyphosis  PALPATION: TTP of bilat thoracic parapsinals in T4-6 region, rhomboids, lats  LUMBAR ROM: thoracolumbar ROM limited as expected with T7-S1 fusion  Difficulty getting into T/S extension in standing; able to once combined with scapular retraction  CERVICAL ROM:   Active ROM A/PROM (deg) eval  Flexion 80  Extension 60  Right lateral flexion 35  Left lateral flexion 35  Right rotation 75  Left rotation 75   (Blank rows = not tested)   MMT Right 6/28  Left 6/28  Shoulder flexion 4+/5 4+/5  Shoulder extension    Shoulder abduction 4+/5 4+/5  Shoulder adduction    Shoulder internal rotation 4+/5 4+/5  Shoulder external rotation 4/5 4/5  (Blank rows = not tested)   GAIT: Distance walked: 41ft Assistive device utilized: None Level of assistance: Complete Independence Comments: WFL  TODAY'S TREATMENT:      DATE: 7/31  -STM to L  periscpa mm/mid thoracic ps, L/R mid rib cage region --weighted carry 5lbs 3x hallway laps, focusing on postural hold  Discussion of history of left knee injury affecting plan of care.  Modifications to make with left knee in order to continue with progression of thoracic and upper back strength  DATE: 7/24  -UBE L1 4' alternating 1' fwd/1'back -STM to L periscpa mm/mid thoracic ps, L/R mid rib cage region -supine horizontal abduction RTB 2x10 -Supine bilateral ER RTB 2x10 -Supine shoulder flexion x10ea with 5" hold at end range for lat stretch Sidelying pec stretch 5x10s L Bilateral ER against wall (open and close-goalpost position) 2x10 Facing wall with shoulders OH- lift off bilaterally 2x10 Scapular stability ball rolls 4 way 2.2# [id number]  DATE: 7/20  -STM to L periscpa mm/mid thoracic ps, L/R mid rib cage region -supine horizontal abduction RTB 2x10 -Supine bilateral ER RTB 2x10 -Supine shoulder flexion x10ea with 5" hold at end range for lat stretch Sidelying pec stretch 3x30s L Bilateral ER against wall (open and close-goalpost position) 2x10 Facing wall with shoulders OH- lift off bilaterally x10ea Scapular stability ball rolls 4 way 2.2# [id number]   DATE: 7/13  -STM to L periscpa mm/mid thoracic ps, R mid rib cage region -supine horizontal abduction YTB 2x10 -Supine bilateral ER YTB 2x10 -Supine chin tucks-5" x10 -Supine shoulder flexion x10ea with 5" hold at end range for lat stretch -lat pull down at cable column (standing with short bar) 10lbs x10 -life [employer] row machine seated- 15lbs 2x10 Bilateral ER against wall (open and close-goalpost position) x10 Reverse wall push up 1x10   DATE: 7/2  -Review of doorway stretch -Instruction in SCM stretch -supine horizontal abduction YTB 2x10 -Supine bilateral ER YTB 2x10 -Supine chin tucks-5" x10 -rhomboid stretch at doorway 3x20sec -lat stretching (seemed to bother shoulder-hold) -sidelying open book stretch  (instructed for HEP) -lat pull down at cable column (standing with short bar) 10lbs x10 -row machine seated with legs on floor- 5lbs 2x10 Bilateral ER against wall (open and close-goalpost position) x10  DATE: 6/28  Exercises - Seated Cervical Retraction  - 3-4 x daily - 7 x weekly - 1 sets - 10 reps - Shoulder External Rotation and Scapular Retraction with Resistance  - 3-4 x daily - 7 x weekly - 1 sets - 10 reps - Single Arm Doorway Pec Stretch at 90 Degrees Abduction  - 2 x daily - 7 x weekly - 1 sets - 3 reps - 30 hold - Seated Row Cable Machine  - 1 x daily - 2-3 x weekly - 2 sets - 10 reps     PATIENT EDUCATION:  Education details: MOI, diagnosis, prognosis, anatomy, exercise progression, DOMS expectations, muscle firing,  envelope of function, HEP, POC  Person educated: Patient Education method: Explanation, Demonstration, Tactile cues, Verbal cues, and Handouts Education comprehension: verbalized understanding, returned demonstration, verbal cues required, and tactile cues required  HOME EXERCISE PROGRAM: Access Code: [email or web address] URL: [email or web address] Date: 09/03/2022 Prepared by: Zebedee Iba  ASSESSMENT:  CLINICAL IMPRESSION: Patient has met expected Foto score today session with improvement in postural awareness and strength.  Patient does present with increase and periscapular and thoracic muscle soreness likely due to increase in exercise.  However, patient was able to tolerate  increase in loading of the upper quarter today without increasing pain while focusing on endurance.  Unfortunately patient's left knee continues to be an aggravating factor in her daily exercise mobility with potential of upcoming surgery affecting plan of care.  Patient to have MRI on 10 August with discussion with physician regarding further steps.  Pt will benefit from continued postural re-ed and strengthening to meet further goals.   OBJECTIVE IMPAIRMENTS decreased mobility,  difficulty walking, decreased ROM, decreased strength, increased muscle spasms, improper body mechanics, postural dysfunction, and pain.    ACTIVITY LIMITATIONS lifting, sitting, standing, squatting, stairs, transfers, locomotion level   PARTICIPATION LIMITATIONS: cleaning, laundry, driving, shopping, community activity, occupation, yard work, and exercise   PERSONAL FACTORS Age, Fitness, Time since onset of injury/illness/exacerbation, and 2+ comorbidity:    are also affecting patient's functional outcome.    REHAB POTENTIAL: Good   CLINICAL DECISION MAKING: unstable/complicated   EVALUATION COMPLEXITY: Moderate     GOALS:     SHORT TERM GOALS: Target date: 10/15/2022      Pt will become independent with HEP in order to demonstrate synthesis of PT education..   Goal status: MET 7/13   2.  Pt will score at least 1 pt increase on FOTO to demonstrate functional improvement in MCII and pt perceived function.      Goal status: MEt   3.  Pt will be able to demonstrate/report ability to sit/stand/sleep for extended periods of time without neck or shoulder discomfort in order to demonstrate functional improvement and tolerance to static positioning.    Goal status: IN PROGRESS (less pain)      LONG TERM GOALS: Target date: 11/26/2022    Pt  will become independent with final HEP in order to demonstrate synthesis of PT education.   Goal status: INITIAL   2.  Pt will score >/= 55 on FOTO to demonstrate improvement in perceived bilat knee function.    Goal status: MET   3.  Pt will be able to demonstrate ability to maintain upright posture and alignment during all exercise in order to demonstrate functional improvement in postural awareness    Goal status: INITIAL          PLAN: PT FREQUENCY: 1-2x/week   PT DURATION: 12 weeks (likely DC in 6)   PLANNED INTERVENTIONS: Therapeutic exercises, Therapeutic activity, Neuromuscular re-education, Balance training, Gait training,  Patient/Family education, Self Care, and Joint mobilization   PLAN FOR NEXT SESSION: anterior stretching, postural strengthening, integration into gym based routine, modified T/S extension     Zebedee Iba, PT 10/06/2022, 10:19 AM

## 2022-10-07 NOTE — Telephone Encounter (Signed)
Last seen on 08/18/22 Follow up scheduled on 02/23/23 Last filled on 09/10/22 #90 tablets (30 day supply) Rx pending to signed

## 2022-10-11 ENCOUNTER — Telehealth: Payer: Self-pay | Admitting: Neurology

## 2022-10-11 ENCOUNTER — Encounter: Payer: Self-pay | Admitting: Neurology

## 2022-10-11 ENCOUNTER — Other Ambulatory Visit: Payer: Self-pay | Admitting: Neurology

## 2022-10-11 NOTE — Telephone Encounter (Signed)
Pharmacy sent refill request, Rx routed to provider.

## 2022-10-11 NOTE — Telephone Encounter (Signed)
Pt is requesting a refill for traMADol (ULTRAM) 50 MG tablet .  Pharmacy: [facility]

## 2022-10-11 NOTE — Telephone Encounter (Signed)
Last seen on 02/10/22 Follow up scheduled on 02/23/23 Last filled on 09/11/22 #120 tablets (30 day supply) Rx pending to be signed

## 2022-10-12 ENCOUNTER — Other Ambulatory Visit: Payer: Self-pay

## 2022-10-12 MED ORDER — TRAMADOL HCL 50 MG PO TABS: ORAL_TABLET | ORAL | 2 refills | Status: DC

## 2022-10-12 NOTE — Telephone Encounter (Signed)
Dr.Sater Rx printed that [facility] sent you, I have this one sent on normal now to be signed.   Thanks!

## 2022-10-13 ENCOUNTER — Other Ambulatory Visit: Payer: Self-pay | Admitting: Neurology

## 2022-10-13 ENCOUNTER — Encounter (HOSPITAL_BASED_OUTPATIENT_CLINIC_OR_DEPARTMENT_OTHER): Payer: Self-pay | Admitting: Physical Therapy

## 2022-10-13 ENCOUNTER — Ambulatory Visit (HOSPITAL_BASED_OUTPATIENT_CLINIC_OR_DEPARTMENT_OTHER): Payer: Medicare Other | Admitting: Physical Therapy

## 2022-10-13 MED ORDER — AMPHETAMINE-DEXTROAMPHETAMINE 20 MG PO TABS: 20.0000 mg | ORAL_TABLET | Freq: Two times a day (BID) | ORAL | 0 refills | Status: DC

## 2022-10-13 NOTE — Telephone Encounter (Signed)
Pt last seen on 08/18/22 Follow up scheduled on 02/23/23 Last filled on 09/13/22 #60 tablets (30 day supply) Rx pending to be signed

## 2022-10-15 ENCOUNTER — Ambulatory Visit (HOSPITAL_BASED_OUTPATIENT_CLINIC_OR_DEPARTMENT_OTHER): Payer: Medicare Other | Attending: Family Medicine

## 2022-10-15 ENCOUNTER — Encounter (HOSPITAL_BASED_OUTPATIENT_CLINIC_OR_DEPARTMENT_OTHER): Payer: Self-pay

## 2022-10-15 DIAGNOSIS — R293 Abnormal posture: Secondary | ICD-10-CM | POA: Insufficient documentation

## 2022-10-15 DIAGNOSIS — R262 Difficulty in walking, not elsewhere classified: Secondary | ICD-10-CM | POA: Diagnosis not present

## 2022-10-15 DIAGNOSIS — M4150 Other secondary scoliosis, site unspecified: Secondary | ICD-10-CM | POA: Diagnosis not present

## 2022-10-15 DIAGNOSIS — Z981 Arthrodesis status: Secondary | ICD-10-CM | POA: Diagnosis not present

## 2022-10-15 NOTE — Therapy (Signed)
OUTPATIENT PHYSICAL THERAPY THORACOLUMBAR TREATMENT   Patient Name: Sheri Burns MRN: [id number] DOB:[birth year], 67 y.o., female Today's Date: 10/15/2022  END OF SESSION:  PT End of Session - 10/15/22 1549     Visit Number 7    Number of Visits 16    Date for PT Re-Evaluation 12/02/22    Authorization Type BCBS MCR    PT Start Time 1518    PT Stop Time 1600    PT Time Calculation (min) 42 min    Activity Tolerance Patient tolerated treatment well    Behavior During Therapy WFL for tasks assessed/performed                   Past Medical History:  Diagnosis Date   Anxiety    Bulging lumbar disc 1980   Chronic pansinusitis    Colon polyp    Depression    GERD (gastroesophageal reflux disease)    Gynecological examination    sees Dr. Aram Beecham Romine    Headache(784.0)    Hypertension    Multiple sclerosis (HCC) since 1992   Dr. Despina Arias in [facility]   Multiple thyroid nodules 1995   per pt. resolved on its own follwed by endo then released   Neurogenic bladder disorder 12/2009   w/ urge & stress inc. dx. Dr. McDiarmid   Neuromuscular disorder [facility])    Multiple sclerosis   Neuropathy    Osteoarthritis    Osteoporosis    last DEXA 2018   Overactive bladder    sees Dr. Lorin Picket McDiarmid    Pancreatitis 1977   Pneumonia    Ulcerative proctitis (HCC)    Vision abnormalities    Past Surgical History:  Procedure Laterality Date   APPENDECTOMY     breast ruptured  left   ductal cyst   CARPAL TUNNEL RELEASE Right    CHOLECYSTECTOMY     COLONOSCOPY  01/10/09   repeat in 5 yrs Dr. Charlott Rakes   CYSTOSCOPY WITH BIOPSY N/A 01/06/2022   Procedure: CYSTOSCOPY WITH BLADDER/URETHRAL BIOPSY;  Surgeon: Sebastian Ache, MD;  Location: [facility];  Service: Urology;  Laterality: N/A;  45 MINS   DILATION AND CURETTAGE OF UTERUS     LAPAROSCOPY     x 3   left knee surgery  1978   to remove bone tumor     NASAL SINUS SURGERY Bilateral 01/13/2015    Procedure: ENDOSCOPIC SINUS SURGERY;  Surgeon: Serena Colonel, MD;  Location: [facility];  Service: ENT;  Laterality: Bilateral;   NASAL SINUS SURGERY  05/2017   NASAL TURBINATE REDUCTION     pubo vaginal sling  10/2003   TONSILLECTOMY     vaginal cystocele repair     anterior   VAGINAL HYSTERECTOMY  1995   AUB & Endometriosis   widom teeth removal     Patient Active Problem List   Diagnosis Date Noted   Vertigo 08/05/2021   Chronic prescription opiate use 12/14/2018   Carpal tunnel syndrome, bilateral 09/13/2017   Sacroiliac joint pain 09/13/2017   Urinary urgency 06/22/2017   Sciatica, right side 06/17/2016   Dysesthesia 10/30/2015   Right carpal tunnel syndrome 10/30/2015   Sciatica of left side 07/16/2015   Chronic ethmoidal sinusitis 06/26/2015   High risk medication use 07/08/2014   Chronic fatigue 04/02/2014   Dysphagia 04/02/2014   Ataxic gait 04/02/2014   Vitamin D deficiency 06/08/2012   OVERACTIVE BLADDER 08/01/2009   Osteoporosis 08/01/2009   Depression with anxiety 07/04/2007  MULTIPLE SCLEROSIS 07/04/2007   OA (osteoarthritis) of knee 07/04/2007   HEADACHE 07/04/2007   COLONIC POLYPS, HX OF 07/04/2007    PCP:  Thana Ates, MD       REFERRING PROVIDER:  Tama Gander, MD     REFERRING DIAG: M41.50 (ICD-10-CM) - Other secondary scoliosis, site unspecified   Rationale for Evaluation and Treatment: Rehabilitation  THERAPY DIAG:  Difficulty walking  Posture abnormality  ONSET DATE: 2023  SUBJECTIVE:                                                                                                                                                                                           SUBJECTIVE STATEMENT:  Pt reports no pain in shoulder at entry. Does have some ongoing R ribcage discomfort that is constant in nature.   PERTINENT HISTORY:  2/23 fusion of T7-S1 for scoliosis; L knee surgery medial condyle excision for  osteochondritis desiccans; L meniscus tear; MS relapsing remitting; history of vertigo  PAIN:  Are you having pain? No  PRECAUTIONS: None   WEIGHT BEARING RESTRICTIONS: No   FALLS:  Has patient fallen in last 6 months? No   LIVING ENVIRONMENT: Lives with: lives alone Lives in: House/apartment Stairs: No Has following equipment at home: [facility] and walker, does not use either    OCCUPATION: ICU nurse, retired    PLOF: Independent with basic ADLs  PATIENT GOALS: prolonging surgery   OBJECTIVE:   DIAGNOSTIC FINDINGS:  N/A recent  PATIENT SURVEYS:  FOTO 55 54 @ DC  SCREENING FOR RED FLAGS: Bowel or bladder incontinence: No Spinal tumors: No Cauda equina syndrome: No Compression fracture: No Abdominal aneurysm: No  COGNITION: Overall cognitive status: Within functional limits for tasks assessed     SENSATION: WFL  MUSCLE LENGTH: Hamstrings:   POSTURE: rounded shoulders, forward head, and increased thoracic kyphosis  PALPATION: TTP of bilat thoracic parapsinals in T4-6 region, rhomboids, lats  LUMBAR ROM: thoracolumbar ROM limited as expected with T7-S1 fusion  Difficulty getting into T/S extension in standing; able to once combined with scapular retraction  CERVICAL ROM:   Active ROM A/PROM (deg) eval  Flexion 80  Extension 60  Right lateral flexion 35  Left lateral flexion 35  Right rotation 75  Left rotation 75   (Blank rows = not tested)   MMT Right 6/28  Left 6/28  Shoulder flexion 4+/5 4+/5  Shoulder extension    Shoulder abduction 4+/5 4+/5  Shoulder adduction    Shoulder internal rotation 4+/5 4+/5  Shoulder external rotation 4/5 4/5  (Blank rows = not tested)   GAIT: Distance walked: 18ft Assistive device  utilized: None Level of assistance: Complete Independence Comments: WFL  TODAY'S TREATMENT:      DATE: 8/9  -UBE L1 4' alternating 1' fwd/1'back -STM to L periscpa mm/mid thoracic ps, L/R mid rib cage region -supine  horizontal abduction RTB 2x10 -Supine bilateral ER RTB 2x10 -Supine shoulder flexion x10ea with 5" hold at end range for lat stretch Sidelying pec stretch 5x10s L Sidelying abd x10 Bilateral ER against wall (open and close-goalpost position) 2x10 Facing wall with shoulders OH- lift off bilaterally 2x10 Scapular stability ball rolls 4 way 2.2# [id number]   DATE: 7/31  -STM to L periscpa mm/mid thoracic ps, L/R mid rib cage region --weighted carry 5lbs 3x hallway laps, focusing on postural hold  Discussion of history of left knee injury affecting plan of care.  Modifications to make with left knee in order to continue with progression of thoracic and upper back strength     PATIENT EDUCATION:  Education details: MOI, diagnosis, prognosis, anatomy, exercise progression, DOMS expectations, muscle firing,  envelope of function, HEP, POC  Person educated: Patient Education method: Explanation, Demonstration, Tactile cues, Verbal cues, and Handouts Education comprehension: verbalized understanding, returned demonstration, verbal cues required, and tactile cues required  HOME EXERCISE PROGRAM: Access Code: [email or web address] URL: [email or web address] Date: 09/03/2022 Prepared by: Zebedee Iba  ASSESSMENT:  CLINICAL IMPRESSION: Pt remains tender and restricted into L periscapular mm and pec mm. Spent time on manual techniques to aid with this. Added isolated SA and lat strengthening. Will continue to progress shoulder strength and stability as tolerated.    OBJECTIVE IMPAIRMENTS decreased mobility, difficulty walking, decreased ROM, decreased strength, increased muscle spasms, improper body mechanics, postural dysfunction, and pain.    ACTIVITY LIMITATIONS lifting, sitting, standing, squatting, stairs, transfers, locomotion level   PARTICIPATION LIMITATIONS: cleaning, laundry, driving, shopping, community activity, occupation, yard work, and exercise   PERSONAL FACTORS Age, Fitness,  Time since onset of injury/illness/exacerbation, and 2+ comorbidity:    are also affecting patient's functional outcome.    REHAB POTENTIAL: Good   CLINICAL DECISION MAKING: unstable/complicated   EVALUATION COMPLEXITY: Moderate     GOALS:     SHORT TERM GOALS: Target date: 10/15/2022      Pt will become independent with HEP in order to demonstrate synthesis of PT education..   Goal status: MET 7/13   2.  Pt will score at least 1 pt increase on FOTO to demonstrate functional improvement in MCII and pt perceived function.      Goal status: MEt   3.  Pt will be able to demonstrate/report ability to sit/stand/sleep for extended periods of time without neck or shoulder discomfort in order to demonstrate functional improvement and tolerance to static positioning.    Goal status: IN PROGRESS (less pain)      LONG TERM GOALS: Target date: 11/26/2022    Pt  will become independent with final HEP in order to demonstrate synthesis of PT education.   Goal status: INITIAL   2.  Pt will score >/= 55 on FOTO to demonstrate improvement in perceived bilat knee function.    Goal status: MET   3.  Pt will be able to demonstrate ability to maintain upright posture and alignment during all exercise in order to demonstrate functional improvement in postural awareness    Goal status: INITIAL          PLAN: PT FREQUENCY: 1-2x/week   PT DURATION: 12 weeks (likely DC in 6)   PLANNED INTERVENTIONS: Therapeutic exercises, Therapeutic activity, Neuromuscular re-education, Balance training,  Gait training, Patient/Family education, Self Care, and Joint mobilization   PLAN FOR NEXT SESSION: anterior stretching, postural strengthening, integration into gym based routine, modified T/S extension     Donnel Saxon , PTA 10/15/2022, 4:44 PM

## 2022-10-16 ENCOUNTER — Other Ambulatory Visit: Payer: Medicare Other

## 2022-10-18 ENCOUNTER — Encounter: Payer: Self-pay | Admitting: Physical Medicine and Rehabilitation

## 2022-10-20 ENCOUNTER — Ambulatory Visit (HOSPITAL_BASED_OUTPATIENT_CLINIC_OR_DEPARTMENT_OTHER): Payer: Medicare Other | Admitting: Physical Therapy

## 2022-10-20 ENCOUNTER — Encounter (HOSPITAL_BASED_OUTPATIENT_CLINIC_OR_DEPARTMENT_OTHER): Payer: Self-pay | Admitting: Physical Therapy

## 2022-10-20 DIAGNOSIS — M4150 Other secondary scoliosis, site unspecified: Secondary | ICD-10-CM | POA: Diagnosis not present

## 2022-10-20 DIAGNOSIS — R262 Difficulty in walking, not elsewhere classified: Secondary | ICD-10-CM

## 2022-10-20 DIAGNOSIS — R293 Abnormal posture: Secondary | ICD-10-CM | POA: Diagnosis not present

## 2022-10-20 DIAGNOSIS — Z981 Arthrodesis status: Secondary | ICD-10-CM | POA: Diagnosis not present

## 2022-10-20 NOTE — Therapy (Signed)
OUTPATIENT PHYSICAL THERAPY THORACOLUMBAR TREATMENT   Patient Name: Sheri Burns MRN: [id number] DOB:06-13-1955, 67 y.o., female Today's Date: 10/20/2022  END OF SESSION:  PT End of Session - 10/20/22 1406     Visit Number 8    Number of Visits 16    Date for PT Re-Evaluation 12/02/22    Authorization Type BCBS MCR    PT Start Time 1402    PT Stop Time 1441    PT Time Calculation (min) 39 min    Activity Tolerance Patient tolerated treatment well    Behavior During Therapy WFL for tasks assessed/performed                   Past Medical History:  Diagnosis Date   Anxiety    Bulging lumbar disc 1980   Chronic pansinusitis    Colon polyp    Depression    GERD (gastroesophageal reflux disease)    Gynecological examination    sees Dr. Aram Beecham Romine    Headache(784.0)    Hypertension    Multiple sclerosis (HCC) since 1992   Dr. Despina Arias in [facility]   Multiple thyroid nodules 1995   per pt. resolved on its own follwed by endo then released   Neurogenic bladder disorder 12/2009   w/ [facility] dx. Dr. McDiarmid   Neuromuscular disorder [facility])    Multiple sclerosis   Neuropathy    Osteoarthritis    Osteoporosis    last DEXA 2018   Overactive bladder    sees Dr. Lorin Picket McDiarmid    Pancreatitis 1977   Pneumonia    Ulcerative proctitis (HCC)    Vision abnormalities    Past Surgical History:  Procedure Laterality Date   APPENDECTOMY     breast ruptured  left   ductal cyst   CARPAL TUNNEL RELEASE Right    CHOLECYSTECTOMY     COLONOSCOPY  01/10/09   repeat in 5 yrs Dr. Charlott Rakes   CYSTOSCOPY WITH BIOPSY N/A 01/06/2022   Procedure: CYSTOSCOPY WITH BLADDER/URETHRAL BIOPSY;  Surgeon: Sebastian Ache, MD;  Location: [facility];  Service: Urology;  Laterality: N/A;  45 MINS   DILATION AND CURETTAGE OF UTERUS     LAPAROSCOPY     x 3   left knee surgery  1978   to remove bone tumor     NASAL SINUS SURGERY Bilateral 01/13/2015    Procedure: ENDOSCOPIC SINUS SURGERY;  Surgeon: Serena Colonel, MD;  Location: [facility];  Service: ENT;  Laterality: Bilateral;   NASAL SINUS SURGERY  05/2017   NASAL TURBINATE REDUCTION     pubo vaginal sling  10/2003   TONSILLECTOMY     vaginal cystocele repair     anterior   VAGINAL HYSTERECTOMY  1995   AUB & Endometriosis   widom teeth removal     Patient Active Problem List   Diagnosis Date Noted   Vertigo 08/05/2021   Chronic prescription opiate use 12/14/2018   Carpal tunnel syndrome, bilateral 09/13/2017   Sacroiliac joint pain 09/13/2017   Urinary urgency 06/22/2017   Sciatica, right side 06/17/2016   Dysesthesia 10/30/2015   Right carpal tunnel syndrome 10/30/2015   Sciatica of left side 07/16/2015   Chronic ethmoidal sinusitis 06/26/2015   High risk medication use 07/08/2014   Chronic fatigue 04/02/2014   Dysphagia 04/02/2014   Ataxic gait 04/02/2014   Vitamin D deficiency 06/08/2012   OVERACTIVE BLADDER 08/01/2009   Osteoporosis 08/01/2009   Depression with anxiety 07/04/2007  MULTIPLE SCLEROSIS 07/04/2007   OA (osteoarthritis) of knee 07/04/2007   HEADACHE 07/04/2007   COLONIC POLYPS, HX OF 07/04/2007    PCP:  Thana Ates, MD       REFERRING PROVIDER:  Tama Gander, MD     REFERRING DIAG: M41.50 (ICD-10-CM) - Other secondary scoliosis, site unspecified   Rationale for Evaluation and Treatment: Rehabilitation  THERAPY DIAG:  Difficulty walking  Posture abnormality  ONSET DATE: 2023  SUBJECTIVE:                                                                                                                                                                                           SUBJECTIVE STATEMENT:  Pt states that the manual from last time helped her pain. She is still sore/in pain from the shoulders down.   PERTINENT HISTORY:  2/23 fusion of T7-S1 for scoliosis; L knee surgery medial condyle excision for osteochondritis  desiccans; L meniscus tear; MS relapsing remitting; history of vertigo  PAIN:  Are you having pain? Yes 4/10 from shoulders down   PRECAUTIONS: None   WEIGHT BEARING RESTRICTIONS: No   FALLS:  Has patient fallen in last 6 months? No   LIVING ENVIRONMENT: Lives with: lives alone Lives in: House/apartment Stairs: No Has following equipment at home: [facility] and walker, does not use either    OCCUPATION: ICU nurse, retired    PLOF: Independent with basic ADLs  PATIENT GOALS: prolonging surgery   OBJECTIVE:   DIAGNOSTIC FINDINGS:  N/A recent  PATIENT SURVEYS:  FOTO 55 54 @ DC  SCREENING FOR RED FLAGS: Bowel or bladder incontinence: No Spinal tumors: No Cauda equina syndrome: No Compression fracture: No Abdominal aneurysm: No  COGNITION: Overall cognitive status: Within functional limits for tasks assessed     SENSATION: WFL  MUSCLE LENGTH: Hamstrings:   POSTURE: rounded shoulders, forward head, and increased thoracic kyphosis  PALPATION: TTP of bilat thoracic parapsinals in T4-6 region, rhomboids, lats  LUMBAR ROM: thoracolumbar ROM limited as expected with T7-S1 fusion  Difficulty getting into T/S extension in standing; able to once combined with scapular retraction  CERVICAL ROM:   Active ROM A/PROM (deg) eval  Flexion 80  Extension 60  Right lateral flexion 35  Left lateral flexion 35  Right rotation 75  Left rotation 75   (Blank rows = not tested)   MMT Right 6/28  Left 6/28  Shoulder flexion 4+/5 4+/5  Shoulder extension    Shoulder abduction 4+/5 4+/5  Shoulder adduction    Shoulder internal rotation 4+/5 4+/5  Shoulder external rotation 4/5 4/5  (Blank rows = not tested)   GAIT:  Distance walked: 55ft Assistive device utilized: None Level of assistance: Complete Independence Comments: WFL  TODAY'S TREATMENT:      8/14 -UBE L1 4' alternating 1' fwd/1'back -STM to L periscpa mm/mid thoracic ps, L/R mid rib cage region -bilat rib  springing T7-12 grade II  -seated thoracic ext with foam roller 5s 2x10 -seated T/S rotation 10x each way 3s  -wall push up 3x8 focus on scap depression -wall angel position with horizontal ABD   Cuing/posture focus on "chest up" vs "head over hips"   DATE: 8/9  -UBE L1 4' alternating 1' fwd/1'back -STM to L periscpa mm/mid thoracic ps, L/R mid rib cage region -supine horizontal abduction RTB 2x10 -Supine bilateral ER RTB 2x10 -Supine shoulder flexion x10ea with 5" hold at end range for lat stretch Sidelying pec stretch 5x10s L Sidelying abd x10 Bilateral ER against wall (open and close-goalpost position) 2x10 Facing wall with shoulders OH- lift off bilaterally 2x10 Scapular stability ball rolls 4 way 2.2# [id number]   DATE: 7/31  -STM to L periscpa mm/mid thoracic ps, L/R mid rib cage region --weighted carry 5lbs 3x hallway laps, focusing on postural hold  Discussion of history of left knee injury affecting plan of care.  Modifications to make with left knee in order to continue with progression of thoracic and upper back strength     PATIENT EDUCATION:  Education details: anatomy, exercise progression, DOMS expectations, muscle firing,  envelope of function, HEP, POC  Person educated: Patient Education method: Explanation, Demonstration, Tactile cues, Verbal cues, and Handouts Education comprehension: verbalized understanding, returned demonstration, verbal cues required, and tactile cues required  HOME EXERCISE PROGRAM: Access Code: [email or web address] URL: [email or web address] Date: 09/03/2022 Prepared by: Zebedee Iba  ASSESSMENT:  CLINICAL IMPRESSION: Patient does continue to respond well to manual therapy of the soft tissue in the parascapular area.  Patient did have increase in mobility with ribs springing and did not note any increase in pain following. However, was painful with grade III so reduced to grade II.  Exercise focused on refocusing of external cue in  order to improve thoracic extension upright positioning.  Patient responds better to chest up and scapular depression type cues in order to improve neutral trunk position.  Home exercise positions reviewed in order to improve postural retraining at home.  Plan to continue with thoracic extension, postural strength, and postural retraining at future sessions.  Plan for progression of visit 10.  Patient will benefit from continued skilled therapy in order to address Fuhs postural deficits for prevention of further surgical intervention and further functional decline.  OBJECTIVE IMPAIRMENTS decreased mobility, difficulty walking, decreased ROM, decreased strength, increased muscle spasms, improper body mechanics, postural dysfunction, and pain.    ACTIVITY LIMITATIONS lifting, sitting, standing, squatting, stairs, transfers, locomotion level   PARTICIPATION LIMITATIONS: cleaning, laundry, driving, shopping, community activity, occupation, yard work, and exercise   PERSONAL FACTORS Age, Fitness, Time since onset of injury/illness/exacerbation, and 2+ comorbidity:    are also affecting patient's functional outcome.    REHAB POTENTIAL: Good   CLINICAL DECISION MAKING: unstable/complicated   EVALUATION COMPLEXITY: Moderate     GOALS:     SHORT TERM GOALS: Target date: 10/15/2022      Pt will become independent with HEP in order to demonstrate synthesis of PT education..   Goal status: MET 7/13   2.  Pt will score at least 1 pt increase on FOTO to demonstrate functional improvement in MCII and pt perceived function.      Goal status:  MEt   3.  Pt will be able to demonstrate/report ability to sit/stand/sleep for extended periods of time without neck or shoulder discomfort in order to demonstrate functional improvement and tolerance to static positioning.    Goal status: IN PROGRESS (less pain)      LONG TERM GOALS: Target date: 11/26/2022    Pt  will become independent with final HEP  in order to demonstrate synthesis of PT education.   Goal status: INITIAL   2.  Pt will score >/= 55 on FOTO to demonstrate improvement in perceived bilat knee function.    Goal status: MET   3.  Pt will be able to demonstrate ability to maintain upright posture and alignment during all exercise in order to demonstrate functional improvement in postural awareness    Goal status: INITIAL          PLAN: PT FREQUENCY: 1-2x/week   PT DURATION: 12 weeks (likely DC in 6)   PLANNED INTERVENTIONS: Therapeutic exercises, Therapeutic activity, Neuromuscular re-education, Balance training, Gait training, Patient/Family education, Self Care, and Joint mobilization   PLAN FOR NEXT SESSION: anterior stretching, postural strengthening, integration into gym based routine, modified T/S extension     Zebedee Iba, PT 10/20/2022, 3:01 PM

## 2022-10-21 ENCOUNTER — Ambulatory Visit
Admission: RE | Admit: 2022-10-21 | Discharge: 2022-10-21 | Disposition: A | Discharge status: Home / Self Care | Payer: Medicare Other | Source: Ambulatory Visit | Attending: Physical Medicine and Rehabilitation | Admitting: Physical Medicine and Rehabilitation

## 2022-10-21 DIAGNOSIS — M23342 Other meniscus derangements, anterior horn of lateral meniscus, left knee: Secondary | ICD-10-CM | POA: Diagnosis not present

## 2022-10-21 DIAGNOSIS — M25562 Pain in left knee: Secondary | ICD-10-CM | POA: Diagnosis not present

## 2022-10-21 DIAGNOSIS — M1712 Unilateral primary osteoarthritis, left knee: Secondary | ICD-10-CM | POA: Diagnosis not present

## 2022-10-21 DIAGNOSIS — G8929 Other chronic pain: Secondary | ICD-10-CM | POA: Diagnosis not present

## 2022-10-22 DIAGNOSIS — M4004 Postural kyphosis, thoracic region: Secondary | ICD-10-CM | POA: Diagnosis not present

## 2022-10-27 ENCOUNTER — Encounter (HOSPITAL_BASED_OUTPATIENT_CLINIC_OR_DEPARTMENT_OTHER): Payer: Self-pay | Admitting: Physical Therapy

## 2022-10-27 ENCOUNTER — Encounter: Payer: Self-pay | Admitting: Neurology

## 2022-10-27 ENCOUNTER — Ambulatory Visit (HOSPITAL_BASED_OUTPATIENT_CLINIC_OR_DEPARTMENT_OTHER): Payer: Medicare Other | Admitting: Physical Therapy

## 2022-10-27 DIAGNOSIS — R262 Difficulty in walking, not elsewhere classified: Secondary | ICD-10-CM

## 2022-10-27 DIAGNOSIS — Z981 Arthrodesis status: Secondary | ICD-10-CM | POA: Diagnosis not present

## 2022-10-27 DIAGNOSIS — R293 Abnormal posture: Secondary | ICD-10-CM

## 2022-10-27 DIAGNOSIS — M4150 Other secondary scoliosis, site unspecified: Secondary | ICD-10-CM | POA: Diagnosis not present

## 2022-10-27 NOTE — Therapy (Signed)
OUTPATIENT PHYSICAL THERAPY THORACOLUMBAR TREATMENT   Patient Name: Sheri Burns MRN: [id number] DOB:1955/04/10, 67 y.o., female Today's Date: 10/27/2022  END OF SESSION:  PT End of Session - 10/27/22 1423     Visit Number 9    Number of Visits 16    Date for PT Re-Evaluation 12/02/22    Authorization Type BCBS MCR    PT Start Time 1445    PT Stop Time 1525    PT Time Calculation (min) 40 min    Activity Tolerance Patient tolerated treatment well    Behavior During Therapy WFL for tasks assessed/performed                   Past Medical History:  Diagnosis Date   Anxiety    Bulging lumbar disc 1980   Chronic pansinusitis    Colon polyp    Depression    GERD (gastroesophageal reflux disease)    Gynecological examination    sees Dr. Aram Beecham Romine    Headache(784.0)    Hypertension    Multiple sclerosis (HCC) since 1992   Dr. Despina Arias in [facility]   Multiple thyroid nodules 1995   per pt. resolved on its own follwed by endo then released   Neurogenic bladder disorder 12/2009   w/ urge [facility] stress inc[facility] dx. Dr. McDiarmid   Neuromuscular disorder [facility])    Multiple sclerosis   Neuropathy    Osteoarthritis    Osteoporosis    last DEXA 2018   Overactive bladder    sees Dr. Lorin Picket McDiarmid    Pancreatitis 1977   Pneumonia    Ulcerative proctitis (HCC)    Vision abnormalities    Past Surgical History:  Procedure Laterality Date   APPENDECTOMY     breast ruptured  left   ductal cyst   CARPAL TUNNEL RELEASE Right    CHOLECYSTECTOMY     COLONOSCOPY  01/10/09   repeat in 5 yrs Dr. Charlott Rakes   CYSTOSCOPY WITH BIOPSY N/A 01/06/2022   Procedure: CYSTOSCOPY WITH BLADDER/URETHRAL BIOPSY;  Surgeon: Sebastian Ache, MD;  Location: [facility];  Service: Urology;  Laterality: N/A;  45 MINS   DILATION AND CURETTAGE OF UTERUS     LAPAROSCOPY     x 3   left knee surgery  1978   to remove bone tumor     NASAL SINUS SURGERY Bilateral 01/13/2015    Procedure: ENDOSCOPIC SINUS SURGERY;  Surgeon: Serena Colonel, MD;  Location: [facility];  Service: ENT;  Laterality: Bilateral;   NASAL SINUS SURGERY  05/2017   NASAL TURBINATE REDUCTION     pubo vaginal sling  10/2003   TONSILLECTOMY     vaginal cystocele repair     anterior   VAGINAL HYSTERECTOMY  1995   AUB & Endometriosis   widom teeth removal     Patient Active Problem List   Diagnosis Date Noted   Vertigo 08/05/2021   Chronic prescription opiate use 12/14/2018   Carpal tunnel syndrome, bilateral 09/13/2017   Sacroiliac joint pain 09/13/2017   Urinary urgency 06/22/2017   Sciatica, right side 06/17/2016   Dysesthesia 10/30/2015   Right carpal tunnel syndrome 10/30/2015   Sciatica of left side 07/16/2015   Chronic ethmoidal sinusitis 06/26/2015   High risk medication use 07/08/2014   Chronic fatigue 04/02/2014   Dysphagia 04/02/2014   Ataxic gait 04/02/2014   Vitamin D deficiency 06/08/2012   OVERACTIVE BLADDER 08/01/2009   Osteoporosis 08/01/2009   Depression with anxiety 07/04/2007  MULTIPLE SCLEROSIS 07/04/2007   OA (osteoarthritis) of knee 07/04/2007   HEADACHE 07/04/2007   COLONIC POLYPS, HX OF 07/04/2007    PCP:  Thana Ates, MD       REFERRING PROVIDER:  Tama Gander, MD     REFERRING DIAG: M41.50 (ICD-10-CM) - Other secondary scoliosis, site unspecified   Rationale for Evaluation and Treatment: Rehabilitation  THERAPY DIAG:  Difficulty walking  Posture abnormality  ONSET DATE: 2023  SUBJECTIVE:                                                                                                                                                                                           SUBJECTIVE STATEMENT:  Pt states that the manual from last time helped her pain. She is still sore/in pain from the shoulders down. Pt reports she has had 2 falls in the last few months. Both were just sudden loss of balance/stability.   PERTINENT  HISTORY:  2/23 fusion of T7-S1 for scoliosis; L knee surgery medial condyle excision for osteochondritis desiccans; L meniscus tear; MS relapsing remitting; history of vertigo  PAIN:  Are you having pain? Yes 4/10 from shoulders down   PRECAUTIONS: None   WEIGHT BEARING RESTRICTIONS: No   FALLS:  Has patient fallen in last 6 months? No   LIVING ENVIRONMENT: Lives with: lives alone Lives in: House/apartment Stairs: No Has following equipment at home: [facility] and walker, does not use either    OCCUPATION: ICU nurse, retired    PLOF: Independent with basic ADLs  PATIENT GOALS: prolonging surgery   OBJECTIVE:   DIAGNOSTIC FINDINGS:  N/A recent  PATIENT SURVEYS:  FOTO 55 54 @ DC  SCREENING FOR RED FLAGS: Bowel or bladder incontinence: No Spinal tumors: No Cauda equina syndrome: No Compression fracture: No Abdominal aneurysm: No  COGNITION: Overall cognitive status: Within functional limits for tasks assessed     SENSATION: WFL  MUSCLE LENGTH: Hamstrings:   POSTURE: rounded shoulders, forward head, and increased thoracic kyphosis  PALPATION: TTP of bilat thoracic parapsinals in T4-6 region, rhomboids, lats  LUMBAR ROM: thoracolumbar ROM limited as expected with T7-S1 fusion  Difficulty getting into T/S extension in standing; able to once combined with scapular retraction  CERVICAL ROM:   Active ROM A/PROM (deg) eval  Flexion 80  Extension 60  Right lateral flexion 35  Left lateral flexion 35  Right rotation 75  Left rotation 75   (Blank rows = not tested)   MMT Right 6/28  Left 6/28  Shoulder flexion 4+/5 4+/5  Shoulder extension    Shoulder abduction 4+/5 4+/5  Shoulder adduction    Shoulder  internal rotation 4+/5 4+/5  Shoulder external rotation 4/5 4/5  (Blank rows = not tested)   GAIT: Distance walked: 29ft Assistive device utilized: None Level of assistance: Complete Independence Comments: WFL  TODAY'S TREATMENT:      8/21  -STM  to L periscpa mm/mid thoracic ps, L/R mid rib cage region  -combined thoracic and lumbar ext with hands propped on table 2s 2x10  8/14 -UBE L1 4' alternating 1' fwd/1'back -STM to L periscpa mm/mid thoracic ps, L/R mid rib cage region -bilat rib springing T7-12 grade II  -seated thoracic ext with foam roller 5s 2x10 -seated T/S rotation 10x each way 3s  -wall push up 3x8 focus on scap depression -wall angel position with horizontal ABD   Cuing/posture focus on "chest up" vs "head over hips"   DATE: 8/9  -UBE L1 4' alternating 1' fwd/1'back -STM to L periscpa mm/mid thoracic ps, L/R mid rib cage region -supine horizontal abduction RTB 2x10 -Supine bilateral ER RTB 2x10 -Supine shoulder flexion x10ea with 5" hold at end range for lat stretch Sidelying pec stretch 5x10s L Sidelying abd x10 Bilateral ER against wall (open and close-goalpost position) 2x10 Facing wall with shoulders OH- lift off bilaterally 2x10 Scapular stability ball rolls 4 way 2.2# [id number]   DATE: 7/31  -STM to L periscpa mm/mid thoracic ps, L/R mid rib cage region --weighted carry 5lbs 3x hallway laps, focusing on postural hold  Discussion of history of left knee injury affecting plan of care.  Modifications to make with left knee in order to continue with progression of thoracic and upper back strength     PATIENT EDUCATION:  Education details: anatomy, exercise progression, DOMS expectations, muscle firing,  envelope of function, HEP, POC  Person educated: Patient Education method: Explanation, Demonstration, Tactile cues, Verbal cues, and Handouts Education comprehension: verbalized understanding, returned demonstration, verbal cues required, and tactile cues required  HOME EXERCISE PROGRAM: Access Code: [email or web address] URL: [email or web address] Date: 09/03/2022 Prepared by: Zebedee Iba  ASSESSMENT:  CLINICAL IMPRESSION: Pt presents today with soreness and hypertonicity of posterior  postural musculature. Pt had good response to manual therapy with report of reduction of   stiffness and pain. Of note, pt does report recent falls due to unexplainable LOB. Pt did have recent imaging performed but was advised to notify neurologist due to this being a recent development. Plan to perform progress note at next session for assessment of future POC and therapy needs. Patient will benefit from continued skilled therapy in order to address Stigall postural deficits for prevention of further surgical intervention and further functional decline.  OBJECTIVE IMPAIRMENTS decreased mobility, difficulty walking, decreased ROM, decreased strength, increased muscle spasms, improper body mechanics, postural dysfunction, and pain.    ACTIVITY LIMITATIONS lifting, sitting, standing, squatting, stairs, transfers, locomotion level   PARTICIPATION LIMITATIONS: cleaning, laundry, driving, shopping, community activity, occupation, yard work, and exercise   PERSONAL FACTORS Age, Fitness, Time since onset of injury/illness/exacerbation, and 2+ comorbidity:    are also affecting patient's functional outcome.    REHAB POTENTIAL: Good   CLINICAL DECISION MAKING: unstable/complicated   EVALUATION COMPLEXITY: Moderate     GOALS:     SHORT TERM GOALS: Target date: 10/15/2022      Pt will become independent with HEP in order to demonstrate synthesis of PT education..   Goal status: MET 7/13   2.  Pt will score at least 1 pt increase on FOTO to demonstrate functional improvement in MCII and pt perceived function.  Goal status: MEt   3.  Pt will be able to demonstrate/report ability to sit/stand/sleep for extended periods of time without neck or shoulder discomfort in order to demonstrate functional improvement and tolerance to static positioning.    Goal status: IN PROGRESS (less pain)      LONG TERM GOALS: Target date: 11/26/2022    Pt  will become independent with final HEP in order to  demonstrate synthesis of PT education.   Goal status: INITIAL   2.  Pt will score >/= 55 on FOTO to demonstrate improvement in perceived bilat knee function.    Goal status: MET   3.  Pt will be able to demonstrate ability to maintain upright posture and alignment during all exercise in order to demonstrate functional improvement in postural awareness    Goal status: INITIAL          PLAN: PT FREQUENCY: 1-2x/week   PT DURATION: 12 weeks (likely DC in 6)   PLANNED INTERVENTIONS: Therapeutic exercises, Therapeutic activity, Neuromuscular re-education, Balance training, Gait training, Patient/Family education, Self Care, and Joint mobilization   PLAN FOR NEXT SESSION: anterior stretching, postural strengthening, integration into gym based routine, modified T/S extension     Zebedee Iba, PT 10/27/2022, 3:55 PM

## 2022-11-03 ENCOUNTER — Other Ambulatory Visit: Payer: Self-pay

## 2022-11-03 ENCOUNTER — Ambulatory Visit (HOSPITAL_BASED_OUTPATIENT_CLINIC_OR_DEPARTMENT_OTHER): Payer: Medicare Other | Admitting: Physical Therapy

## 2022-11-03 ENCOUNTER — Encounter (HOSPITAL_BASED_OUTPATIENT_CLINIC_OR_DEPARTMENT_OTHER): Payer: Self-pay | Admitting: Physical Therapy

## 2022-11-03 DIAGNOSIS — R293 Abnormal posture: Secondary | ICD-10-CM

## 2022-11-03 DIAGNOSIS — M4150 Other secondary scoliosis, site unspecified: Secondary | ICD-10-CM | POA: Diagnosis not present

## 2022-11-03 DIAGNOSIS — R262 Difficulty in walking, not elsewhere classified: Secondary | ICD-10-CM | POA: Diagnosis not present

## 2022-11-03 DIAGNOSIS — Z981 Arthrodesis status: Secondary | ICD-10-CM | POA: Diagnosis not present

## 2022-11-03 MED ORDER — DULOXETINE HCL 60 MG PO CPEP: ORAL_CAPSULE | ORAL | 1 refills | Status: DC

## 2022-11-03 NOTE — Therapy (Signed)
OUTPATIENT PHYSICAL THERAPY THORACOLUMBAR TREATMENT  Progress Note Reporting Period 09/03/22 to 11/03/22   See note below for Objective Data and Assessment of Progress/Goals.       Patient Name: Sheri Burns MRN: [id number] DOB:1955/06/06, 67 y.o., female Today's Date: 11/03/2022  END OF SESSION:  PT End of Session - 11/03/22 1514     Visit Number 10    Number of Visits 16    Date for PT Re-Evaluation 12/02/22    Authorization Type BCBS MCR    PT Start Time 1447    PT Stop Time 1525    PT Time Calculation (min) 38 min    Activity Tolerance Patient tolerated treatment well    Behavior During Therapy WFL for tasks assessed/performed                    Past Medical History:  Diagnosis Date   Anxiety    Bulging lumbar disc 1980   Chronic pansinusitis    Colon polyp    Depression    GERD (gastroesophageal reflux disease)    Gynecological examination    sees Dr. Aram Beecham Romine    Headache(784.0)    Hypertension    Multiple sclerosis (HCC) since 1992   Dr. Despina Arias in [facility]   Multiple thyroid nodules 1995   per pt. resolved on its own follwed by endo then released   Neurogenic bladder disorder 12/2009   w/ urge & stress inc. dx. Dr. McDiarmid   Neuromuscular disorder [facility])    Multiple sclerosis   Neuropathy    Osteoarthritis    Osteoporosis    last DEXA 2018   Overactive bladder    sees Dr. Lorin Picket McDiarmid    Pancreatitis 1977   Pneumonia    Ulcerative proctitis (HCC)    Vision abnormalities    Past Surgical History:  Procedure Laterality Date   APPENDECTOMY     breast ruptured  left   ductal cyst   CARPAL TUNNEL RELEASE Right    CHOLECYSTECTOMY     COLONOSCOPY  01/10/09   repeat in 5 yrs Dr. Charlott Rakes   CYSTOSCOPY WITH BIOPSY N/A 01/06/2022   Procedure: CYSTOSCOPY WITH BLADDER/URETHRAL BIOPSY;  Surgeon: Sebastian Ache, MD;  Location: [facility];  Service: Urology;  Laterality: N/A;  45 MINS   DILATION AND CURETTAGE OF  UTERUS     LAPAROSCOPY     x 3   left knee surgery  1978   to remove bone tumor     NASAL SINUS SURGERY Bilateral 01/13/2015   Procedure: ENDOSCOPIC SINUS SURGERY;  Surgeon: Serena Colonel, MD;  Location: [facility];  Service: ENT;  Laterality: Bilateral;   NASAL SINUS SURGERY  05/2017   NASAL TURBINATE REDUCTION     pubo vaginal sling  10/2003   TONSILLECTOMY     vaginal cystocele repair     anterior   VAGINAL HYSTERECTOMY  1995   AUB & Endometriosis   widom teeth removal     Patient Active Problem List   Diagnosis Date Noted   Vertigo 08/05/2021   Chronic prescription opiate use 12/14/2018   Carpal tunnel syndrome, bilateral 09/13/2017   Sacroiliac joint pain 09/13/2017   Urinary urgency 06/22/2017   Sciatica, right side 06/17/2016   Dysesthesia 10/30/2015   Right carpal tunnel syndrome 10/30/2015   Sciatica of left side 07/16/2015   Chronic ethmoidal sinusitis 06/26/2015   High risk medication use 07/08/2014   Chronic fatigue 04/02/2014   Dysphagia 04/02/2014  Ataxic gait 04/02/2014   Vitamin D deficiency 06/08/2012   OVERACTIVE BLADDER 08/01/2009   Osteoporosis 08/01/2009   Depression with anxiety 07/04/2007   MULTIPLE SCLEROSIS 07/04/2007   OA (osteoarthritis) of knee 07/04/2007   HEADACHE 07/04/2007   COLONIC POLYPS, HX OF 07/04/2007    PCP:  Thana Ates, MD       REFERRING PROVIDER:  Tama Gander, MD     REFERRING DIAG: M41.50 (ICD-10-CM) - Other secondary scoliosis, site unspecified   Rationale for Evaluation and Treatment: Rehabilitation  THERAPY DIAG:  Difficulty walking  Posture abnormality  ONSET DATE: 2023  SUBJECTIVE:                                                                                                                                                                                           SUBJECTIVE STATEMENT:  Pt states that the manual was helpful and she noticed less pain on the drive over here today. She is  still working on posture and exercise in the gym.  Pt feels that pain has leveled off and no longer painful. She feels she is able to achieve better posture.   PERTINENT HISTORY:  2/23 fusion of T7-S1 for scoliosis; L knee surgery medial condyle excision for osteochondritis desiccans; L meniscus tear; MS relapsing remitting; history of vertigo  PAIN:  Are you having pain? Yes 3/10 from shoulders down   PRECAUTIONS: None   WEIGHT BEARING RESTRICTIONS: No   FALLS:  Has patient fallen in last 6 months? No   LIVING ENVIRONMENT: Lives with: lives alone Lives in: House/apartment Stairs: No Has following equipment at home: [facility] and walker, does not use either    OCCUPATION: ICU nurse, retired    PLOF: Independent with basic ADLs  PATIENT GOALS: prolonging surgery   OBJECTIVE:   DIAGNOSTIC FINDINGS:  N/A recent  PATIENT SURVEYS:  FOTO 55 54 @ DC  FOTO 8/28  67%    SCREENING FOR RED FLAGS: Bowel or bladder incontinence: No Spinal tumors: No Cauda equina syndrome: No Compression fracture: No Abdominal aneurysm: No  COGNITION: Overall cognitive status: Within functional limits for tasks assessed     SENSATION: WFL  MUSCLE LENGTH: Hamstrings:   POSTURE: rounded shoulders, forward head, and increased thoracic kyphosis  PALPATION: TTP of bilat thoracic parapsinals in T4-6 region, rhomboids, lats  LUMBAR ROM: thoracolumbar ROM limited as expected with T7-S1 fusion  Difficulty getting into T/S extension in standing; able to once combined with scapular retraction  CERVICAL ROM:   Active ROM A/PROM (deg) eval 8/28  Flexion 80 80  Extension 60 60  Right lateral flexion 35 40  Left  lateral flexion 35 40  Right rotation 75 75  Left rotation 75 75   (Blank rows = not tested)   MMT Right 6/28 R  8/28  Left 6/28 L 8/28   Shoulder flexion 4+/5 5/5 4+/5 5/5  Shoulder extension      Shoulder abduction 4+/5 5/5 4+/5 5/5  Shoulder adduction      Shoulder  internal rotation 4+/5 5/5 4+/5 5/5  Shoulder external rotation 4/5 4+/5 4/5 4+/5  (Blank rows = not tested)   GAIT: Distance walked: 60ft Assistive device utilized: None Level of assistance: Complete Independence Comments: WFL  TODAY'S TREATMENT:      8/28 -STM to L periscap mm/mid thoracic parspinals  Exercises - Seated Cervical Retraction  - 3-4 x daily - 7 x weekly - 1 sets - 10 reps - Shoulder External Rotation and Scapular Retraction with Resistance  - 3-4 x daily - 7 x weekly - 1 sets - 10 reps - Single Arm Doorway Pec Stretch at 90 Degrees Abduction  - 2 x daily - 7 x weekly - 1 sets - 3 reps - 30 hold - Sidelying Thoracic Rotation with Open Book  - 1 x daily - 7 x weekly - 2 sets - 5 reps - 5-10seconds hold - Seated Row Cable Machine  - 1 x daily - 2-3 x weekly - 2 sets - 10 reps - Farmer[employer] with Kettlebells  - 1 x daily - 2-3 x weekly - 1 sets - 3 reps - 7ft hold  8/21  -STM to L periscpa mm/mid thoracic ps, L/R mid rib cage region  -combined thoracic and lumbar ext with hands propped on table 2s 2x10  8/14 -UBE L1 4' alternating 1' fwd/1'back -STM to L periscpa mm/mid thoracic ps, L/R mid rib cage region -bilat rib springing T7-12 grade II  -seated thoracic ext with foam roller 5s 2x10 -seated T/S rotation 10x each way 3s  -wall push up 3x8 focus on scap depression -wall angel position with horizontal ABD   Cuing/posture focus on "chest up" vs "head over hips"    PATIENT EDUCATION:  Education details: anatomy, exercise progression, DOMS expectations, muscle firing,  envelope of function, HEP, POC  Person educated: Patient Education method: Explanation, Demonstration, Tactile cues, Verbal cues, and Handouts Education comprehension: verbalized understanding, returned demonstration, verbal cues required, and tactile cues required  HOME EXERCISE PROGRAM: Access Code: [email or web address] URL: [email or web address] Date: 09/03/2022 Prepared by: Zebedee Iba  ASSESSMENT:  CLINICAL IMPRESSION: Patient continues to make progress with physical therapy with partial training as well as reinstruction in the gym-based exercise for overall upper thoracic and trunk strength.  Patient has met and exceeded Foto outcome measure at this time despite having long-term pain and longstanding postural deficits.  Patient continues to improve ability to tolerate gym-based exercise and would likely benefit from continued skilled therapy in order to address postural deficits prevention of further surgical intervention.  Plan to continue with gym-based loaded strengthening with consideration towards left knee pain as patient is preparing for trip that involves walking/hiking on various terrain.  Patient will benefit from continued skilled therapy in order to address Anand postural deficits for prevention of further surgical intervention and further functional decline.  OBJECTIVE IMPAIRMENTS decreased mobility, difficulty walking, decreased ROM, decreased strength, increased muscle spasms, improper body mechanics, postural dysfunction, and pain.    ACTIVITY LIMITATIONS lifting, sitting, standing, squatting, stairs, transfers, locomotion level   PARTICIPATION LIMITATIONS: cleaning, laundry, driving, shopping, community activity, occupation, yard work, and exercise  PERSONAL FACTORS Age, Fitness, Time since onset of injury/illness/exacerbation, and 2+ comorbidity:    are also affecting patient's functional outcome.    REHAB POTENTIAL: Good   CLINICAL DECISION MAKING: unstable/complicated   EVALUATION COMPLEXITY: Moderate     GOALS:     SHORT TERM GOALS: Target date: 10/15/2022      Pt will become independent with HEP in order to demonstrate synthesis of PT education..   Goal status: MET 7/13   2.  Pt will score at least 1 pt increase on FOTO to demonstrate functional improvement in MCII and pt perceived function.      Goal status: MEt   3.  Pt will be  able to demonstrate/report ability to sit/stand/sleep for extended periods of time without neck or shoulder discomfort in order to demonstrate functional improvement and tolerance to static positioning.    Goal status: IN PROGRESS (less pain)      LONG TERM GOALS: Target date: 11/26/2022    Pt  will become independent with final HEP in order to demonstrate synthesis of PT education.   Goal status: ongoing   2.  Pt will score >/= 55 on FOTO to demonstrate improvement in perceived bilat knee function.    Goal status: MET   3.  Pt will be able to demonstrate ability to maintain upright posture and alignment during all exercise in order to demonstrate functional improvement in postural awareness    Goal status: ongoing          PLAN: PT FREQUENCY: 1-2x/week   PT DURATION: 12 weeks (likely DC in 6)   PLANNED INTERVENTIONS: Therapeutic exercises, Therapeutic activity, Neuromuscular re-education, Balance training, Gait training, Patient/Family education, Self Care, and Joint mobilization   PLAN FOR NEXT SESSION: anterior stretching, postural strengthening, integration into gym based routine, modified T/S extension     Zebedee Iba, PT 11/03/2022, 8:24 PM

## 2022-11-11 ENCOUNTER — Other Ambulatory Visit: Payer: Self-pay | Admitting: Neurology

## 2022-11-11 DIAGNOSIS — M1712 Unilateral primary osteoarthritis, left knee: Secondary | ICD-10-CM | POA: Diagnosis not present

## 2022-11-15 ENCOUNTER — Encounter (HOSPITAL_BASED_OUTPATIENT_CLINIC_OR_DEPARTMENT_OTHER): Payer: Self-pay | Admitting: Physical Therapy

## 2022-11-15 ENCOUNTER — Other Ambulatory Visit: Payer: Self-pay | Admitting: Neurology

## 2022-11-15 ENCOUNTER — Ambulatory Visit (HOSPITAL_BASED_OUTPATIENT_CLINIC_OR_DEPARTMENT_OTHER): Payer: Medicare Other | Admitting: Physical Therapy

## 2022-11-16 MED ORDER — AMPHETAMINE-DEXTROAMPHETAMINE 20 MG PO TABS: 20.0000 mg | ORAL_TABLET | Freq: Two times a day (BID) | ORAL | 0 refills | Status: DC

## 2022-11-16 NOTE — Telephone Encounter (Signed)
Last seen on 08/18/22 Follow up scheduled on 02/23/23 Last filled on 10/14/22 #60 tablets (30 day supply) Rx pending to be signed

## 2022-11-17 ENCOUNTER — Ambulatory Visit
Admission: RE | Admit: 2022-11-17 | Discharge: 2022-11-17 | Disposition: A | Discharge status: Home / Self Care | Payer: Medicare Other | Source: Ambulatory Visit | Attending: Internal Medicine | Admitting: Internal Medicine

## 2022-11-17 ENCOUNTER — Other Ambulatory Visit: Payer: Self-pay | Admitting: Neurology

## 2022-11-17 DIAGNOSIS — Z1231 Encounter for screening mammogram for malignant neoplasm of breast: Secondary | ICD-10-CM | POA: Diagnosis not present

## 2022-11-17 MED ORDER — AMPHETAMINE-DEXTROAMPHETAMINE 20 MG PO TABS: 20.0000 mg | ORAL_TABLET | Freq: Two times a day (BID) | ORAL | 0 refills | Status: DC

## 2022-11-19 ENCOUNTER — Ambulatory Visit (HOSPITAL_BASED_OUTPATIENT_CLINIC_OR_DEPARTMENT_OTHER): Payer: Medicare Other

## 2022-11-26 ENCOUNTER — Ambulatory Visit (HOSPITAL_BASED_OUTPATIENT_CLINIC_OR_DEPARTMENT_OTHER): Payer: Medicare Other | Attending: Family Medicine

## 2022-11-26 ENCOUNTER — Encounter (HOSPITAL_BASED_OUTPATIENT_CLINIC_OR_DEPARTMENT_OTHER): Payer: Self-pay

## 2022-11-26 DIAGNOSIS — R262 Difficulty in walking, not elsewhere classified: Secondary | ICD-10-CM | POA: Insufficient documentation

## 2022-11-26 DIAGNOSIS — R293 Abnormal posture: Secondary | ICD-10-CM | POA: Insufficient documentation

## 2022-11-26 NOTE — Therapy (Signed)
OUTPATIENT PHYSICAL THERAPY THORACOLUMBAR TREATMENT  Progress Note Reporting Period 09/03/22 to 11/03/22   See note below for Objective Data and Assessment of Progress/Goals.       Patient Name: Sheri Burns MRN: [id number] DOB:Mar 07, 1956, 67 y.o., female Today's Date: 11/26/2022  END OF SESSION:  PT End of Session - 11/26/22 1524     Visit Number 11    Number of Visits 16    Date for PT Re-Evaluation 12/02/22    Authorization Type BCBS MCR    PT Start Time 1522    PT Stop Time 1600    PT Time Calculation (min) 38 min    Activity Tolerance Patient tolerated treatment well    Behavior During Therapy WFL for tasks assessed/performed                     Past Medical History:  Diagnosis Date   Anxiety    Bulging lumbar disc 1980   Chronic pansinusitis    Colon polyp    Depression    GERD (gastroesophageal reflux disease)    Gynecological examination    sees Dr. Aram Beecham Romine    Headache(784.0)    Hypertension    Multiple sclerosis (HCC) since 1992   Dr. Despina Arias in [facility]   Multiple thyroid nodules 1995   per pt. resolved on its own follwed by endo then released   Neurogenic bladder disorder 12/2009   w/ urge [facility] stress inc. dx. Dr. McDiarmid   Neuromuscular disorder [facility])    Multiple sclerosis   Neuropathy    Osteoarthritis    Osteoporosis    last DEXA 2018   Overactive bladder    sees Dr. Lorin Picket McDiarmid    Pancreatitis 1977   Pneumonia    Ulcerative proctitis (HCC)    Vision abnormalities    Past Surgical History:  Procedure Laterality Date   APPENDECTOMY     breast ruptured  left   ductal cyst   CARPAL TUNNEL RELEASE Right    CHOLECYSTECTOMY     COLONOSCOPY  01/10/09   repeat in 5 yrs Dr. Charlott Rakes   CYSTOSCOPY WITH BIOPSY N/A 01/06/2022   Procedure: CYSTOSCOPY WITH BLADDER/URETHRAL BIOPSY;  Surgeon: Sebastian Ache, MD;  Location: [facility];  Service: Urology;  Laterality: N/A;  45 MINS   DILATION AND CURETTAGE  OF UTERUS     LAPAROSCOPY     x 3   left knee surgery  1978   to remove bone tumor     NASAL SINUS SURGERY Bilateral 01/13/2015   Procedure: ENDOSCOPIC SINUS SURGERY;  Surgeon: Serena Colonel, MD;  Location: [facility];  Service: ENT;  Laterality: Bilateral;   NASAL SINUS SURGERY  05/2017   NASAL TURBINATE REDUCTION     pubo vaginal sling  10/2003   TONSILLECTOMY     vaginal cystocele repair     anterior   VAGINAL HYSTERECTOMY  1995   AUB & Endometriosis   widom teeth removal     Patient Active Problem List   Diagnosis Date Noted   Vertigo 08/05/2021   Chronic prescription opiate use 12/14/2018   Carpal tunnel syndrome, bilateral 09/13/2017   Sacroiliac joint pain 09/13/2017   Urinary urgency 06/22/2017   Sciatica, right side 06/17/2016   Dysesthesia 10/30/2015   Right carpal tunnel syndrome 10/30/2015   Sciatica of left side 07/16/2015   Chronic ethmoidal sinusitis 06/26/2015   High risk medication use 07/08/2014   Chronic fatigue 04/02/2014   Dysphagia 04/02/2014  Ataxic gait 04/02/2014   Vitamin D deficiency 06/08/2012   OVERACTIVE BLADDER 08/01/2009   Osteoporosis 08/01/2009   Depression with anxiety 07/04/2007   MULTIPLE SCLEROSIS 07/04/2007   OA (osteoarthritis) of knee 07/04/2007   HEADACHE 07/04/2007   COLONIC POLYPS, HX OF 07/04/2007    PCP:  Thana Ates, MD       REFERRING PROVIDER:  Tama Gander, MD     REFERRING DIAG: M41.50 (ICD-10-CM) - Other secondary scoliosis, site unspecified   Rationale for Evaluation and Treatment: Rehabilitation  THERAPY DIAG:  Difficulty walking  Posture abnormality  ONSET DATE: 2023  SUBJECTIVE:                                                                                                                                                                                           SUBJECTIVE STATEMENT:  Pt had Covid on 9/4 with severe symptoms, though managed to stay out of the hospital. Symptoms  have since resolved aside from lingering fatigue. No SOB. Pt has not attended PT or the gym during this time and feels deconditioned. Pt is scheduled for L TKA in January 2025. 3-4/10 pain level in mid thoracic area.   PERTINENT HISTORY:  2/23 fusion of T7-S1 for scoliosis; L knee surgery medial condyle excision for osteochondritis desiccans; L meniscus tear; MS relapsing remitting; history of vertigo  PAIN:  Are you having pain? Yes 3/10 from shoulders down   PRECAUTIONS: None   WEIGHT BEARING RESTRICTIONS: No   FALLS:  Has patient fallen in last 6 months? No   LIVING ENVIRONMENT: Lives with: lives alone Lives in: House/apartment Stairs: No Has following equipment at home: [facility] and walker, does not use either    OCCUPATION: ICU nurse, retired    PLOF: Independent with basic ADLs  PATIENT GOALS: prolonging surgery   OBJECTIVE:   DIAGNOSTIC FINDINGS:  N/A recent  PATIENT SURVEYS:  FOTO 55 54 @ DC  FOTO 8/28  67%    SCREENING FOR RED FLAGS: Bowel or bladder incontinence: No Spinal tumors: No Cauda equina syndrome: No Compression fracture: No Abdominal aneurysm: No  COGNITION: Overall cognitive status: Within functional limits for tasks assessed     SENSATION: WFL  MUSCLE LENGTH: Hamstrings:   POSTURE: rounded shoulders, forward head, and increased thoracic kyphosis  PALPATION: TTP of bilat thoracic parapsinals in T4-6 region, rhomboids, lats  LUMBAR ROM: thoracolumbar ROM limited as expected with T7-S1 fusion  Difficulty getting into T/S extension in standing; able to once combined with scapular retraction  CERVICAL ROM:   Active ROM A/PROM (deg) eval 8/28  Flexion 80 80  Extension 60 60  Right  lateral flexion 35 40  Left lateral flexion 35 40  Right rotation 75 75  Left rotation 75 75   (Blank rows = not tested)   MMT Right 6/28 R  8/28  Left 6/28 L 8/28   Shoulder flexion 4+/5 5/5 4+/5 5/5  Shoulder extension      Shoulder  abduction 4+/5 5/5 4+/5 5/5  Shoulder adduction      Shoulder internal rotation 4+/5 5/5 4+/5 5/5  Shoulder external rotation 4/5 4+/5 4/5 4+/5  (Blank rows = not tested)   GAIT: Distance walked: 66ft Assistive device utilized: None Level of assistance: Complete Independence Comments: WFL  TODAY'S TREATMENT:      9/20 STM to L periscap mm/mid thoracic paraspinals -Row machine #12- 30# 2x10  -Triceps machine (push down) 25lbs 2x10 -Lat pull at cable column 2x10 (standing)  -Sidelying open bok- 5" x10 -doorway stretch 20sec x2  8/28 -STM to L periscap mm/mid thoracic parspinals  Exercises - Seated Cervical Retraction  - 3-4 x daily - 7 x weekly - 1 sets - 10 reps - Shoulder External Rotation and Scapular Retraction with Resistance  - 3-4 x daily - 7 x weekly - 1 sets - 10 reps - Single Arm Doorway Pec Stretch at 90 Degrees Abduction  - 2 x daily - 7 x weekly - 1 sets - 3 reps - 30 hold - Sidelying Thoracic Rotation with Open Book  - 1 x daily - 7 x weekly - 2 sets - 5 reps - 5-10seconds hold - Seated Row Cable Machine  - 1 x daily - 2-3 x weekly - 2 sets - 10 reps - Farmer's Carry with Kettlebells  - 1 x daily - 2-3 x weekly - 1 sets - 3 reps - 28ft hold  8/21  -STM to L periscpa mm/mid thoracic ps, L/R mid rib cage region  -combined thoracic and lumbar ext with hands propped on table 2s 2x10  8/14 -UBE L1 4' alternating 1' fwd/1'back -STM to L periscpa mm/mid thoracic ps, L/R mid rib cage region -bilat rib springing T7-12 grade II  -seated thoracic ext with foam roller 5s 2x10 -seated T/S rotation 10x each way 3s  -wall push up 3x8 focus on scap depression -wall angel position with horizontal ABD   Cuing/posture focus on "chest up" vs "head over hips"    PATIENT EDUCATION:  Education details: anatomy, exercise progression, DOMS expectations, muscle firing,  envelope of function, HEP, POC  Person educated: Patient Education method: Explanation, Demonstration,  Tactile cues, Verbal cues, and Handouts Education comprehension: verbalized understanding, returned demonstration, verbal cues required, and tactile cues required  HOME EXERCISE PROGRAM: Access Code: [email or web address] URL: [email or web address] Date: 09/03/2022 Prepared by: Zebedee Iba  ASSESSMENT:  CLINICAL IMPRESSION: Reintroduced exercise program today as pt had to take time off. Continued to work on manual techniques to thoracic area with good response, though tender throughout. She did well with gym based program, however, her L knee did bother her with sit to stand movements getting up off the machines. Will assess response next visit and progress as tolerated.   OBJECTIVE IMPAIRMENTS decreased mobility, difficulty walking, decreased ROM, decreased strength, increased muscle spasms, improper body mechanics, postural dysfunction, and pain.    ACTIVITY LIMITATIONS lifting, sitting, standing, squatting, stairs, transfers, locomotion level   PARTICIPATION LIMITATIONS: cleaning, laundry, driving, shopping, community activity, occupation, yard work, and exercise   PERSONAL FACTORS Age, Fitness, Time since onset of injury/illness/exacerbation, and 2+ comorbidity:    are also affecting patient's functional  outcome.    REHAB POTENTIAL: Good   CLINICAL DECISION MAKING: unstable/complicated   EVALUATION COMPLEXITY: Moderate     GOALS:     SHORT TERM GOALS: Target date: 10/15/2022      Pt will become independent with HEP in order to demonstrate synthesis of PT education..   Goal status: MET 7/13   2.  Pt will score at least 1 pt increase on FOTO to demonstrate functional improvement in MCII and pt perceived function.      Goal status: MEt   3.  Pt will be able to demonstrate/report ability to sit/stand/sleep for extended periods of time without neck or shoulder discomfort in order to demonstrate functional improvement and tolerance to static positioning.    Goal status: IN  PROGRESS (less pain)      LONG TERM GOALS: Target date: 11/26/2022    Pt  will become independent with final HEP in order to demonstrate synthesis of PT education.   Goal status: ongoing   2.  Pt will score >/= 55 on FOTO to demonstrate improvement in perceived bilat knee function.    Goal status: MET   3.  Pt will be able to demonstrate ability to maintain upright posture and alignment during all exercise in order to demonstrate functional improvement in postural awareness    Goal status: ongoing          PLAN: PT FREQUENCY: 1-2x/week   PT DURATION: 12 weeks (likely DC in 6)   PLANNED INTERVENTIONS: Therapeutic exercises, Therapeutic activity, Neuromuscular re-education, Balance training, Gait training, Patient/Family education, Self Care, and Joint mobilization   PLAN FOR NEXT SESSION: anterior stretching, postural strengthening, integration into gym based routine, modified T/S extension     Lenore Manner, PTA 11/26/2022, 4:42 PM

## 2022-11-30 ENCOUNTER — Ambulatory Visit (HOSPITAL_BASED_OUTPATIENT_CLINIC_OR_DEPARTMENT_OTHER): Payer: Medicare Other

## 2022-11-30 ENCOUNTER — Encounter (HOSPITAL_BASED_OUTPATIENT_CLINIC_OR_DEPARTMENT_OTHER): Payer: Self-pay

## 2022-11-30 DIAGNOSIS — R293 Abnormal posture: Secondary | ICD-10-CM

## 2022-11-30 DIAGNOSIS — R262 Difficulty in walking, not elsewhere classified: Secondary | ICD-10-CM

## 2022-11-30 NOTE — Therapy (Signed)
OUTPATIENT PHYSICAL THERAPY THORACOLUMBAR TREATMENT       Patient Name: Sheri Burns MRN: [id number] DOB:09-12-1955, 67 y.o., female Today's Date: 11/30/2022  END OF SESSION:  PT End of Session - 11/30/22 1301     Visit Number 12    Number of Visits 16    Date for PT Re-Evaluation 12/02/22    Authorization Type BCBS MCR    PT Start Time 1301    PT Stop Time 1345    PT Time Calculation (min) 44 min    Activity Tolerance Patient tolerated treatment well    Behavior During Therapy WFL for tasks assessed/performed                      Past Medical History:  Diagnosis Date   Anxiety    Bulging lumbar disc 1980   Chronic pansinusitis    Colon polyp    Depression    GERD (gastroesophageal reflux disease)    Gynecological examination    sees Dr. Aram Beecham Romine    Headache(784.0)    Hypertension    Multiple sclerosis (HCC) since 1992   Dr. Despina Arias in [facility]   Multiple thyroid nodules 1995   per pt. resolved on its own follwed by endo then released   Neurogenic bladder disorder 12/2009   w/ [facility] dx. Dr. McDiarmid   Neuromuscular disorder [facility])    Multiple sclerosis   Neuropathy    Osteoarthritis    Osteoporosis    last DEXA 2018   Overactive bladder    sees Dr. Lorin Picket McDiarmid    Pancreatitis 1977   Pneumonia    Ulcerative proctitis (HCC)    Vision abnormalities    Past Surgical History:  Procedure Laterality Date   APPENDECTOMY     breast ruptured  left   ductal cyst   CARPAL TUNNEL RELEASE Right    CHOLECYSTECTOMY     COLONOSCOPY  01/10/09   repeat in 5 yrs Dr. Charlott Rakes   CYSTOSCOPY WITH BIOPSY N/A 01/06/2022   Procedure: CYSTOSCOPY WITH BLADDER/URETHRAL BIOPSY;  Surgeon: Sebastian Ache, MD;  Location: [facility];  Service: Urology;  Laterality: N/A;  45 MINS   DILATION AND CURETTAGE OF UTERUS     LAPAROSCOPY     x 3   left knee surgery  1978   to remove bone tumor     NASAL SINUS SURGERY Bilateral  01/13/2015   Procedure: ENDOSCOPIC SINUS SURGERY;  Surgeon: Serena Colonel, MD;  Location:  SURGERY CENTER;  Service: ENT;  Laterality: Bilateral;   NASAL SINUS SURGERY  05/2017   NASAL TURBINATE REDUCTION     pubo vaginal sling  10/2003   TONSILLECTOMY     vaginal cystocele repair     anterior   VAGINAL HYSTERECTOMY  1995   AUB & Endometriosis   widom teeth removal     Patient Active Problem List   Diagnosis Date Noted   Vertigo 08/05/2021   Chronic prescription opiate use 12/14/2018   Carpal tunnel syndrome, bilateral 09/13/2017   Sacroiliac joint pain 09/13/2017   Urinary urgency 06/22/2017   Sciatica, right side 06/17/2016   Dysesthesia 10/30/2015   Right carpal tunnel syndrome 10/30/2015   Sciatica of left side 07/16/2015   Chronic ethmoidal sinusitis 06/26/2015   High risk medication use 07/08/2014   Chronic fatigue 04/02/2014   Dysphagia 04/02/2014   Ataxic gait 04/02/2014   Vitamin D deficiency 06/08/2012   OVERACTIVE BLADDER 08/01/2009   Osteoporosis 08/01/2009  Depression with anxiety 07/04/2007   MULTIPLE SCLEROSIS 07/04/2007   OA (osteoarthritis) of knee 07/04/2007   HEADACHE 07/04/2007   COLONIC POLYPS, HX OF 07/04/2007    PCP:  Thana Ates, MD       REFERRING PROVIDER:  Tama Gander, MD     REFERRING DIAG: M41.50 (ICD-10-CM) - Other secondary scoliosis, site unspecified   Rationale for Evaluation and Treatment: Rehabilitation  THERAPY DIAG:  Difficulty walking  Posture abnormality  ONSET DATE: 2023  SUBJECTIVE:                                                                                                                                                                                           SUBJECTIVE STATEMENT:  Pt reports continued L knee pain. Muscle soreness in upper and mid back after last session.  PERTINENT HISTORY:  2/23 fusion of T7-S1 for scoliosis; L knee surgery medial condyle excision for osteochondritis  desiccans; L meniscus tear; MS relapsing remitting; history of vertigo  PAIN:  Are you having pain? Yes 3/10 from shoulders down   PRECAUTIONS: None   WEIGHT BEARING RESTRICTIONS: No   FALLS:  Has patient fallen in last 6 months? No   LIVING ENVIRONMENT: Lives with: lives alone Lives in: House/apartment Stairs: No Has following equipment at home: [facility] and walker, does not use either    OCCUPATION: ICU nurse, retired    PLOF: Independent with basic ADLs  PATIENT GOALS: prolonging surgery   OBJECTIVE:   DIAGNOSTIC FINDINGS:  N/A recent  PATIENT SURVEYS:  FOTO 55 54 @ DC  FOTO 8/28  67%    SCREENING FOR RED FLAGS: Bowel or bladder incontinence: No Spinal tumors: No Cauda equina syndrome: No Compression fracture: No Abdominal aneurysm: No  COGNITION: Overall cognitive status: Within functional limits for tasks assessed     SENSATION: WFL  MUSCLE LENGTH: Hamstrings:   POSTURE: rounded shoulders, forward head, and increased thoracic kyphosis  PALPATION: TTP of bilat thoracic parapsinals in T4-6 region, rhomboids, lats  LUMBAR ROM: thoracolumbar ROM limited as expected with T7-S1 fusion  Difficulty getting into T/S extension in standing; able to once combined with scapular retraction  CERVICAL ROM:   Active ROM A/PROM (deg) eval 8/28  Flexion 80 80  Extension 60 60  Right lateral flexion 35 40  Left lateral flexion 35 40  Right rotation 75 75  Left rotation 75 75   (Blank rows = not tested)   MMT Right 6/28 R  8/28  Left 6/28 L 8/28   Shoulder flexion 4+/5 5/5 4+/5 5/5  Shoulder extension      Shoulder abduction 4+/5 5/5 4+/5  5/5  Shoulder adduction      Shoulder internal rotation 4+/5 5/5 4+/5 5/5  Shoulder external rotation 4/5 4+/5 4/5 4+/5  (Blank rows = not tested)   GAIT: Distance walked: 60ft Assistive device utilized: None Level of assistance: Complete Independence Comments: WFL  TODAY'S TREATMENT:      9/24 STM to L  and R periscap mm/mid thoracic paraspinals-sidelying Rhomboid stretch in doorway 20sec x2 Bil ER against wall x10 -SA raise against wall RTB x10 -Reverse wall push ups x10 (L knee pain) -Row machine #12- 30# 2x10  -Triceps machine (push down) 25lbs 2x10 -Lat pull at cable column 2x10 20lbs (standing)  -Sidelying open book- 5" x5ea -LAQ with ball sqz x10 -HSC RTB x10    9/20 STM to L periscap mm/mid thoracic paraspinals -Row machine #12- 30# 2x10  -Triceps machine (push down) 25lbs 2x10 -Lat pull at cable column 2x10 (standing)  -Sidelying open bok- 5" x10 -doorway stretch 20sec x2  8/28 -STM to L periscap mm/mid thoracic parspinals  Exercises - Seated Cervical Retraction  - 3-4 x daily - 7 x weekly - 1 sets - 10 reps - Shoulder External Rotation and Scapular Retraction with Resistance  - 3-4 x daily - 7 x weekly - 1 sets - 10 reps - Single Arm Doorway Pec Stretch at 90 Degrees Abduction  - 2 x daily - 7 x weekly - 1 sets - 3 reps - 30 hold - Sidelying Thoracic Rotation with Open Book  - 1 x daily - 7 x weekly - 2 sets - 5 reps - 5-10seconds hold - Seated Row Cable Machine  - 1 x daily - 2-3 x weekly - 2 sets - 10 reps - Farmer's Carry with Kettlebells  - 1 x daily - 2-3 x weekly - 1 sets - 3 reps - 51ft hold  8/21  -STM to L periscpa mm/mid thoracic ps, L/R mid rib cage region  -combined thoracic and lumbar ext with hands propped on table 2s 2x10  8/14 -UBE L1 4' alternating 1' fwd/1'back -STM to L periscpa mm/mid thoracic ps, L/R mid rib cage region -bilat rib springing T7-12 grade II  -seated thoracic ext with foam roller 5s 2x10 -seated T/S rotation 10x each way 3s  -wall push up 3x8 focus on scap depression -wall angel position with horizontal ABD   Cuing/posture focus on "chest up" vs "head over hips"    PATIENT EDUCATION:  Education details: anatomy, exercise progression, DOMS expectations, muscle firing,  envelope of function, HEP, POC  Person educated:  Patient Education method: Explanation, Demonstration, Tactile cues, Verbal cues, and Handouts Education comprehension: verbalized understanding, returned demonstration, verbal cues required, and tactile cues required  HOME EXERCISE PROGRAM: Access Code: [email or web address] URL: [email or web address] Date: 09/03/2022 Prepared by: Zebedee Iba  ASSESSMENT:  CLINICAL IMPRESSION: Pt with continued tenderness and triggerpoints in bilateral thoracic paraspinals. Again spent time on STM to this area to decrease these. Good tolerance for postural re-training and strengthening, though her L knee did bother her in WB positions. Pt awaiting TKA in January. Would like to be able to go on [place] cruise this fall with as little pain/difficulty as possible.    OBJECTIVE IMPAIRMENTS decreased mobility, difficulty walking, decreased ROM, decreased strength, increased muscle spasms, improper body mechanics, postural dysfunction, and pain.    ACTIVITY LIMITATIONS lifting, sitting, standing, squatting, stairs, transfers, locomotion level   PARTICIPATION LIMITATIONS: cleaning, laundry, driving, shopping, community activity, occupation, yard work, and exercise   PERSONAL FACTORS Age, Fitness, Time since  onset of injury/illness/exacerbation, and 2+ comorbidity:    are also affecting patient's functional outcome.    REHAB POTENTIAL: Good   CLINICAL DECISION MAKING: unstable/complicated   EVALUATION COMPLEXITY: Moderate     GOALS:     SHORT TERM GOALS: Target date: 10/15/2022      Pt will become independent with HEP in order to demonstrate synthesis of PT education..   Goal status: MET 7/13   2.  Pt will score at least 1 pt increase on FOTO to demonstrate functional improvement in MCII and pt perceived function.      Goal status: MEt   3.  Pt will be able to demonstrate/report ability to sit/stand/sleep for extended periods of time without neck or shoulder discomfort in order to demonstrate  functional improvement and tolerance to static positioning.    Goal status: IN PROGRESS (less pain)      LONG TERM GOALS: Target date: 11/26/2022    Pt  will become independent with final HEP in order to demonstrate synthesis of PT education.   Goal status: ongoing   2.  Pt will score >/= 55 on FOTO to demonstrate improvement in perceived bilat knee function.    Goal status: MET   3.  Pt will be able to demonstrate ability to maintain upright posture and alignment during all exercise in order to demonstrate functional improvement in postural awareness    Goal status: ongoing          PLAN: PT FREQUENCY: 1-2x/week   PT DURATION: 12 weeks (likely DC in 6)   PLANNED INTERVENTIONS: Therapeutic exercises, Therapeutic activity, Neuromuscular re-education, Balance training, Gait training, Patient/Family education, Self Care, and Joint mobilization   PLAN FOR NEXT SESSION: anterior stretching, postural strengthening, integration into gym based routine, modified T/S extension     Donnel Saxon Mycheal [place], PTA 11/30/2022, 2:21 PM

## 2022-12-08 ENCOUNTER — Encounter (HOSPITAL_BASED_OUTPATIENT_CLINIC_OR_DEPARTMENT_OTHER): Payer: Self-pay | Admitting: Physical Therapy

## 2022-12-08 ENCOUNTER — Ambulatory Visit (HOSPITAL_BASED_OUTPATIENT_CLINIC_OR_DEPARTMENT_OTHER): Payer: Medicare Other | Attending: Family Medicine | Admitting: Physical Therapy

## 2022-12-08 ENCOUNTER — Other Ambulatory Visit (HOSPITAL_BASED_OUTPATIENT_CLINIC_OR_DEPARTMENT_OTHER): Payer: Self-pay

## 2022-12-08 DIAGNOSIS — R262 Difficulty in walking, not elsewhere classified: Secondary | ICD-10-CM | POA: Diagnosis not present

## 2022-12-08 DIAGNOSIS — R293 Abnormal posture: Secondary | ICD-10-CM | POA: Diagnosis not present

## 2022-12-08 MED ORDER — INFLUENZA VIRUS VACC SPLIT PF (FLUZONE) 0.5 ML IM SUSY
0.5000 mL | PREFILLED_SYRINGE | Freq: Once | INTRAMUSCULAR | 0 refills | Status: AC
  Filled 2022-12-08: qty 0.5, 1d supply, fill #0

## 2022-12-08 NOTE — Therapy (Signed)
OUTPATIENT PHYSICAL THERAPY THORACOLUMBAR TREATMENT       Patient Name: Sheri Burns MRN: [id number] DOB:09-05-1955, 67 y.o., female Today's Date: 12/08/2022  END OF SESSION:  PT End of Session - 12/08/22 1338     Visit Number 13    Number of Visits 22    Date for PT Re-Evaluation 03/08/23    Authorization Type BCBS MCR    PT Start Time 1300    PT Stop Time 1342    PT Time Calculation (min) 42 min    Activity Tolerance Patient tolerated treatment well    Behavior During Therapy WFL for tasks assessed/performed                       Past Medical History:  Diagnosis Date   Anxiety    Bulging lumbar disc 1980   Chronic pansinusitis    Colon polyp    Depression    GERD (gastroesophageal reflux disease)    Gynecological examination    sees Dr. Aram Beecham Romine    Headache(784.0)    Hypertension    Multiple sclerosis (HCC) since 1992   Dr. Despina Arias in [facility]   Multiple thyroid nodules 1995   per pt. resolved on its own follwed by endo then released   Neurogenic bladder disorder 12/2009   w/ [facility] dx. Dr. McDiarmid   Neuromuscular disorder [facility])    Multiple sclerosis   Neuropathy    Osteoarthritis    Osteoporosis    last DEXA 2018   Overactive bladder    sees Dr. Lorin Picket McDiarmid    Pancreatitis 1977   Pneumonia    Ulcerative proctitis (HCC)    Vision abnormalities    Past Surgical History:  Procedure Laterality Date   APPENDECTOMY     breast ruptured  left   ductal cyst   CARPAL TUNNEL RELEASE Right    CHOLECYSTECTOMY     COLONOSCOPY  01/10/09   repeat in 5 yrs Dr. Charlott Rakes   CYSTOSCOPY WITH BIOPSY N/A 01/06/2022   Procedure: CYSTOSCOPY WITH BLADDER/URETHRAL BIOPSY;  Surgeon: Sebastian Ache, MD;  Location: [facility];  Service: Urology;  Laterality: N/A;  45 MINS   DILATION AND CURETTAGE OF UTERUS     LAPAROSCOPY     x 3   left knee surgery  1978   to remove bone tumor     NASAL SINUS SURGERY Bilateral  01/13/2015   Procedure: ENDOSCOPIC SINUS SURGERY;  Surgeon: Serena Colonel, MD;  Location:  SURGERY CENTER;  Service: ENT;  Laterality: Bilateral;   NASAL SINUS SURGERY  05/2017   NASAL TURBINATE REDUCTION     pubo vaginal sling  10/2003   TONSILLECTOMY     vaginal cystocele repair     anterior   VAGINAL HYSTERECTOMY  1995   AUB & Endometriosis   widom teeth removal     Patient Active Problem List   Diagnosis Date Noted   Vertigo 08/05/2021   Chronic prescription opiate use 12/14/2018   Carpal tunnel syndrome, bilateral 09/13/2017   Sacroiliac joint pain 09/13/2017   Urinary urgency 06/22/2017   Sciatica, right side 06/17/2016   Dysesthesia 10/30/2015   Right carpal tunnel syndrome 10/30/2015   Sciatica of left side 07/16/2015   Chronic ethmoidal sinusitis 06/26/2015   High risk medication use 07/08/2014   Chronic fatigue 04/02/2014   Dysphagia 04/02/2014   Ataxic gait 04/02/2014   Vitamin D deficiency 06/08/2012   OVERACTIVE BLADDER 08/01/2009   Osteoporosis  08/01/2009   Depression with anxiety 07/04/2007   MULTIPLE SCLEROSIS 07/04/2007   OA (osteoarthritis) of knee 07/04/2007   Headache 07/04/2007   History of colonic polyps 07/04/2007    PCP:  Thana Ates, MD       REFERRING PROVIDER:  Tama Gander, MD     REFERRING DIAG: M41.50 (ICD-10-CM) - Other secondary scoliosis, site unspecified   Rationale for Evaluation and Treatment: Rehabilitation  THERAPY DIAG:  Difficulty walking - Plan: PT plan of care cert/re-cert  Posture abnormality - Plan: PT plan of care cert/re-cert  ONSET DATE: 2023  SUBJECTIVE:                                                                                                                                                                                           SUBJECTIVE STATEMENT:  Pt states that the postural muscle soreness has improved with less aching and pain. Pt states that the L knee started to hurt and limits here  walking. Pt has limited ability to walk due to the knee. Pt states she is 30% better with less postural pain.  PERTINENT HISTORY:  2/23 fusion of T7-S1 for scoliosis; L knee surgery medial condyle excision for osteochondritis desiccans; L meniscus tear; MS relapsing remitting; history of vertigo  PAIN:  Are you having pain? Yes 2/10 from shoulder blades   Yes to L knee pain. 7/10   PRECAUTIONS: None   WEIGHT BEARING RESTRICTIONS: No   FALLS:  Has patient fallen in last 6 months? No   LIVING ENVIRONMENT: Lives with: lives alone Lives in: House/apartment Stairs: No Has following equipment at home: [facility] and walker, does not use either    OCCUPATION: ICU nurse, retired    PLOF: Independent with basic ADLs  PATIENT GOALS: prolonging surgery   OBJECTIVE:   DIAGNOSTIC FINDINGS:  N/A recent  PATIENT SURVEYS:  FOTO 55 54 @ DC  FOTO 8/28  67%    SCREENING FOR RED FLAGS: Bowel or bladder incontinence: No Spinal tumors: No Cauda equina syndrome: No Compression fracture: No Abdominal aneurysm: No  COGNITION: Overall cognitive status: Within functional limits for tasks assessed     SENSATION: WFL   POSTURE: rounded shoulders, forward head, and increased thoracic kyphosis  PALPATION: TTP of bilat thoracic parapsinals in T4-6 region, rhomboids, lats  LUMBAR ROM: thoracolumbar ROM limited as expected with T7-S1 fusion; no pain with AROM; no longer painful with SB   Difficulty getting into T/S extension in standing; able to once combined with scapular retraction  CERVICAL ROM:   Active ROM A/PROM (deg) eval 8/28 10/2   Flexion 80 80 80  Extension  60 60 60  Right lateral flexion 35 40 40  Left lateral flexion 35 40 40  Right rotation 75 75 75  Left rotation 75 75 75   (Blank rows = not tested)   MMT Right 6/28 R  8/28  Left 6/28 L 10/2   Shoulder flexion 4+/5 5/5 4+/5 5/5  Shoulder extension      Shoulder abduction 4+/5 5/5 4+/5 5/5  Shoulder  adduction      Shoulder internal rotation 4+/5 5/5 4+/5 5/5  Shoulder external rotation 4/5 4+/5 4/5 4+/5  (Blank rows = not tested)   GAIT: Distance walked: 10ft Assistive device utilized: None Level of assistance: Complete Independence Comments: WFL  TODAY'S TREATMENT:      10/2  STM to L and R periscap mm/mid thoracic paraspinals-sidelying  Modified deadbug on inclined table 2x10 (alternating LE extension) Bilat ER against wall 2x10 Standing T/S ext and lat stretch using tall end of table 5s 10x    9/24 STM to L and R periscap mm/mid thoracic paraspinals-sidelying Rhomboid stretch in doorway 20sec x2 Bil ER against wall x10 -SA raise against wall RTB x10 -Reverse wall push ups x10 (L knee pain) -Row machine #12- 30# 2x10  -Triceps machine (push down) 25lbs 2x10 -Lat pull at cable column 2x10 20lbs (standing)  -Sidelying open book- 5" x5ea -LAQ with ball sqz x10 -HSC RTB x10    9/20 STM to L periscap mm/mid thoracic paraspinals -Row machine #12- 30# 2x10  -Triceps machine (push down) 25lbs 2x10 -Lat pull at cable column 2x10 (standing)  -Sidelying open bok- 5" x10 -doorway stretch 20sec x2  8/28 -STM to L periscap mm/mid thoracic parspinals  Exercises - Seated Cervical Retraction  - 3-4 x daily - 7 x weekly - 1 sets - 10 reps - Shoulder External Rotation and Scapular Retraction with Resistance  - 3-4 x daily - 7 x weekly - 1 sets - 10 reps - Single Arm Doorway Pec Stretch at 90 Degrees Abduction  - 2 x daily - 7 x weekly - 1 sets - 3 reps - 30 hold - Sidelying Thoracic Rotation with Open Book  - 1 x daily - 7 x weekly - 2 sets - 5 reps - 5-10seconds hold - Seated Row Cable Machine  - 1 x daily - 2-3 x weekly - 2 sets - 10 reps - Farmer's Carry with Kettlebells  - 1 x daily - 2-3 x weekly - 1 sets - 3 reps - 3ft hold  8/21  -STM to L periscpa mm/mid thoracic ps, L/R mid rib cage region  -combined thoracic and lumbar ext with hands propped on table 2s  2x10  8/14 -UBE L1 4' alternating 1' fwd/1'back -STM to L periscpa mm/mid thoracic ps, L/R mid rib cage region -bilat rib springing T7-12 grade II  -seated thoracic ext with foam roller 5s 2x10 -seated T/S rotation 10x each way 3s  -wall push up 3x8 focus on scap depression -wall angel position with horizontal ABD   Cuing/posture focus on "chest up" vs "head over hips"    PATIENT EDUCATION:  Education details: anatomy, exercise progression, DOMS expectations, muscle firing,  envelope of function, HEP, POC  Person educated: Patient Education method: Explanation, Demonstration, Tactile cues, Verbal cues, and Handouts Education comprehension: verbalized understanding, returned demonstration, verbal cues required, and tactile cues required  HOME EXERCISE PROGRAM: Access Code: [email or web address] URL: [email or web address] Date: 09/03/2022 Prepared by: Zebedee Iba  ASSESSMENT:  CLINICAL IMPRESSION: Pt continues to have subjective improvement in postural  function and demonstrates no loss of thoracolumbar ROM. Pt's baseline pain has improved into the lumbar and thoracic regions. Pt's funcitonal mobility is no further impaired due to recurrence of L knee pain. Pt has improved postural awareness and is more upright in standing. However, functional mobility is limited by knee discomfort. Plan to continue at reduced frequency with PT to continue with postural strength for prevention of further functional decline.  OBJECTIVE IMPAIRMENTS decreased mobility, difficulty walking, decreased ROM, decreased strength, increased muscle spasms, improper body mechanics, postural dysfunction, and pain.    ACTIVITY LIMITATIONS lifting, sitting, standing, squatting, stairs, transfers, locomotion level   PARTICIPATION LIMITATIONS: cleaning, laundry, driving, shopping, community activity, occupation, yard work, and exercise   PERSONAL FACTORS Age, Fitness, Time since onset of  injury/illness/exacerbation, and 2+ comorbidity:    are also affecting patient's functional outcome.    REHAB POTENTIAL: Good   CLINICAL DECISION MAKING: unstable/complicated   EVALUATION COMPLEXITY: Moderate     GOALS:     SHORT TERM GOALS: Target date: 10/15/2022      Pt will become independent with HEP in order to demonstrate synthesis of PT education..   Goal status: MET 7/13   2.  Pt will score at least 1 pt increase on FOTO to demonstrate functional improvement in MCII and pt perceived function.      Goal status: MEt   3.  Pt will be able to demonstrate/report ability to sit/stand/sleep for extended periods of time without neck or shoulder discomfort in order to demonstrate functional improvement and tolerance to static positioning.    Goal status: IN PROGRESS (less pain)      LONG TERM GOALS: Target date: 03/02/2023     Pt  will become independent with final HEP in order to demonstrate synthesis of PT education.   Goal status: ongoing   2.  Pt will score >/= 55 on FOTO to demonstrate improvement in perceived bilat knee function.    Goal status: MET   3.  Pt will be able to demonstrate ability to maintain upright posture and alignment during all exercise in order to demonstrate functional improvement in postural awareness    Goal status: ongoing          PLAN: PT FREQUENCY: 1-2x/week   PT DURATION: 12 weeks (likely DC in 6)   PLANNED INTERVENTIONS: Therapeutic exercises, Therapeutic activity, Neuromuscular re-education, Balance training, Gait training, Patient/Family education, Self Care, and Joint mobilization   PLAN FOR NEXT SESSION: anterior stretching, postural strengthening, integration into gym based routine, modified T/S extension     Zebedee Iba, PT 12/08/2022, 2:04 PM

## 2022-12-09 ENCOUNTER — Telehealth: Payer: Self-pay | Admitting: *Deleted

## 2022-12-09 NOTE — Telephone Encounter (Signed)
  Faxed to Dr.Frank Aluisio [phone] confirmation received.

## 2022-12-10 DIAGNOSIS — R140 Abdominal distension (gaseous): Secondary | ICD-10-CM | POA: Diagnosis not present

## 2022-12-10 DIAGNOSIS — R159 Full incontinence of feces: Secondary | ICD-10-CM | POA: Diagnosis not present

## 2022-12-10 DIAGNOSIS — K219 Gastro-esophageal reflux disease without esophagitis: Secondary | ICD-10-CM | POA: Diagnosis not present

## 2022-12-13 ENCOUNTER — Other Ambulatory Visit: Payer: Self-pay | Admitting: Neurology

## 2022-12-14 MED ORDER — AMPHETAMINE-DEXTROAMPHETAMINE 20 MG PO TABS: 20.0000 mg | ORAL_TABLET | Freq: Two times a day (BID) | ORAL | 0 refills | Status: DC

## 2022-12-15 ENCOUNTER — Encounter (HOSPITAL_BASED_OUTPATIENT_CLINIC_OR_DEPARTMENT_OTHER): Payer: Self-pay | Admitting: Physical Therapy

## 2022-12-15 ENCOUNTER — Ambulatory Visit (HOSPITAL_BASED_OUTPATIENT_CLINIC_OR_DEPARTMENT_OTHER): Payer: Medicare Other | Admitting: Physical Therapy

## 2022-12-15 DIAGNOSIS — R262 Difficulty in walking, not elsewhere classified: Secondary | ICD-10-CM | POA: Diagnosis not present

## 2022-12-15 DIAGNOSIS — R293 Abnormal posture: Secondary | ICD-10-CM

## 2022-12-15 NOTE — Therapy (Signed)
OUTPATIENT PHYSICAL THERAPY THORACOLUMBAR TREATMENT       Patient Name: Sheri Burns MRN: [id number] DOB:[birth year], 67 y.o., female Today's Date: 12/15/2022  END OF SESSION:  PT End of Session - 12/15/22 1455     Visit Number 14    Number of Visits 22    Date for PT Re-Evaluation 03/08/23    Authorization Type BCBS MCR    PT Start Time 1447    PT Stop Time 1526    PT Time Calculation (min) 39 min    Activity Tolerance Patient tolerated treatment well    Behavior During Therapy WFL for tasks assessed/performed                        Past Medical History:  Diagnosis Date   Anxiety    Bulging lumbar disc 1980   Chronic pansinusitis    Colon polyp    Depression    GERD (gastroesophageal reflux disease)    Gynecological examination    sees Dr. Aram Beecham Romine    Headache(784.0)    Hypertension    Multiple sclerosis (HCC) since 1992   Dr. Despina Arias in [facility]   Multiple thyroid nodules 1995   per pt. resolved on its own follwed by endo then released   Neurogenic bladder disorder 12/2009   w/ urge [facility] stress [facility]. dx. Dr. McDiarmid   Neuromuscular disorder [facility])    Multiple sclerosis   Neuropathy    Osteoarthritis    Osteoporosis    last DEXA 2018   Overactive bladder    sees Dr. Lorin Picket McDiarmid    Pancreatitis 1977   Pneumonia    Ulcerative proctitis (HCC)    Vision abnormalities    Past Surgical History:  Procedure Laterality Date   APPENDECTOMY     breast ruptured  left   ductal cyst   CARPAL TUNNEL RELEASE Right    CHOLECYSTECTOMY     COLONOSCOPY  01/10/09   repeat in 5 yrs Dr. Charlott Rakes   CYSTOSCOPY WITH BIOPSY N/A 01/06/2022   Procedure: CYSTOSCOPY WITH BLADDER/URETHRAL BIOPSY;  Surgeon: Sebastian Ache, MD;  Location: [facility];  Service: Urology;  Laterality: N/A;  45 MINS   DILATION AND CURETTAGE OF UTERUS     LAPAROSCOPY     x 3   left knee surgery  1978   to remove bone tumor     NASAL SINUS SURGERY Bilateral  01/13/2015   Procedure: ENDOSCOPIC SINUS SURGERY;  Surgeon: Serena Colonel, MD;  Location: [facility];  Service: ENT;  Laterality: Bilateral;   NASAL SINUS SURGERY  05/2017   NASAL TURBINATE REDUCTION     pubo vaginal sling  10/2003   TONSILLECTOMY     vaginal cystocele repair     anterior   VAGINAL HYSTERECTOMY  1995   AUB & Endometriosis   widom teeth removal     Patient Active Problem List   Diagnosis Date Noted   Vertigo 08/05/2021   Chronic prescription opiate use 12/14/2018   Carpal tunnel syndrome, bilateral 09/13/2017   Sacroiliac joint pain 09/13/2017   Urinary urgency 06/22/2017   Sciatica, right side 06/17/2016   Dysesthesia 10/30/2015   Right carpal tunnel syndrome 10/30/2015   Sciatica of left side 07/16/2015   Chronic ethmoidal sinusitis 06/26/2015   High risk medication use 07/08/2014   Chronic fatigue 04/02/2014   Dysphagia 04/02/2014   Ataxic gait 04/02/2014   Vitamin D deficiency 06/08/2012   OVERACTIVE BLADDER 08/01/2009  Osteoporosis 08/01/2009   Depression with anxiety 07/04/2007   MULTIPLE SCLEROSIS 07/04/2007   OA (osteoarthritis) of knee 07/04/2007   Headache 07/04/2007   History of colonic polyps 07/04/2007    PCP:  Thana Ates, MD       REFERRING PROVIDER:  Tama Gander, MD     REFERRING DIAG: M41.50 (ICD-10-CM) - Other secondary scoliosis, site unspecified   Rationale for Evaluation and Treatment: Rehabilitation  THERAPY DIAG:  Difficulty walking  Posture abnormality  ONSET DATE: 2023  SUBJECTIVE:                                                                                                                                                                                           SUBJECTIVE STATEMENT:  Pt states that the postural muscle soreness has improved with less aching and pain. Pt states that the L knee started to hurt and limits here walking. Pt has limited ability to walk due to the knee. Pt states she is  30% better with less postural pain.  PERTINENT HISTORY:  2/23 fusion of T7-S1 for scoliosis; L knee surgery medial condyle excision for osteochondritis desiccans; L meniscus tear; MS relapsing remitting; history of vertigo  PAIN:  Are you having pain? Yes 2/10 from shoulder blades   Yes to L knee pain. 7/10   PRECAUTIONS: None   WEIGHT BEARING RESTRICTIONS: No   FALLS:  Has patient fallen in last 6 months? No   LIVING ENVIRONMENT: Lives with: lives alone Lives in: House/apartment Stairs: No Has following equipment at home: [facility] and walker, does not use either    OCCUPATION: ICU nurse, retired    PLOF: Independent with basic ADLs  PATIENT GOALS: prolonging surgery   OBJECTIVE:   DIAGNOSTIC FINDINGS:  N/A recent  PATIENT SURVEYS:  FOTO 55 54 @ DC  FOTO 8/28  67%    SCREENING FOR RED FLAGS: Bowel or bladder incontinence: No Spinal tumors: No Cauda equina syndrome: No Compression fracture: No Abdominal aneurysm: No  COGNITION: Overall cognitive status: Within functional limits for tasks assessed     SENSATION: WFL   POSTURE: rounded shoulders, forward head, and increased thoracic kyphosis  PALPATION: TTP of bilat thoracic parapsinals in T4-6 region, rhomboids, lats  LUMBAR ROM: thoracolumbar ROM limited as expected with T7-S1 fusion; no pain with AROM; no longer painful with SB   Difficulty getting into T/S extension in standing; able to once combined with scapular retraction  CERVICAL ROM:   Active ROM A/PROM (deg) eval 8/28 10/2   Flexion 80 80 80  Extension 60 60 60  Right lateral flexion 35 40 40  Left lateral  flexion 35 40 40  Right rotation 75 75 75  Left rotation 75 75 75   (Blank rows = not tested)   MMT Right 6/28 R  8/28  Left 6/28 L 10/2   Shoulder flexion 4+/5 5/5 4+/5 5/5  Shoulder extension      Shoulder abduction 4+/5 5/5 4+/5 5/5  Shoulder adduction      Shoulder internal rotation 4+/5 5/5 4+/5 5/5  Shoulder  external rotation 4/5 4+/5 4/5 4+/5  (Blank rows = not tested)   GAIT: Distance walked: 7ft Assistive device utilized: None Level of assistance: Complete Independence Comments: WFL  TODAY'S TREATMENT:       10/9  STM to L and R periscap mm/mid thoracic paraspinals-sidelying  Modified deadbug on inclined table 2x10 (alternating LE extension) Incline W 2x10 Incline table shoulder flexion 3lb weight 3x10 Table include horizontal Abd YTB 2x10 Standing T/S ext and lat stretch using green ball 2x10   10/2  STM to L and R periscap mm/mid thoracic paraspinals-sidelying  Modified deadbug on inclined table 2x10 (alternating LE extension) Bilat ER against wall 2x10 Standing T/S ext and lat stretch using tall end of table 5s 10x    9/24 STM to L and R periscap mm/mid thoracic paraspinals-sidelying Rhomboid stretch in doorway 20sec x2 Bil ER against wall x10 -SA raise against wall RTB x10 -Reverse wall push ups x10 (L knee pain) -Row machine #12- 30# 2x10  -Triceps machine (push down) 25lbs 2x10 -Lat pull at cable column 2x10 20lbs (standing)  -Sidelying open book- 5" x5ea -LAQ with ball sqz x10 -HSC RTB x10    9/20 STM to L periscap mm/mid thoracic paraspinals -Row machine #12- 30# 2x10  -Triceps machine (push down) 25lbs 2x10 -Lat pull at cable column 2x10 (standing)  -Sidelying open bok- 5" x10 -doorway stretch 20sec x2  8/28 -STM to L periscap mm/mid thoracic parspinals  Exercises - Seated Cervical Retraction  - 3-4 x daily - 7 x weekly - 1 sets - 10 reps - Shoulder External Rotation and Scapular Retraction with Resistance  - 3-4 x daily - 7 x weekly - 1 sets - 10 reps - Single Arm Doorway Pec Stretch at 90 Degrees Abduction  - 2 x daily - 7 x weekly - 1 sets - 3 reps - 30 hold - Sidelying Thoracic Rotation with Open Book  - 1 x daily - 7 x weekly - 2 sets - 5 reps - 5-10seconds hold - Seated Row Cable Machine  - 1 x daily - 2-3 x weekly - 2 sets - 10 reps -  Farmer's Carry with Kettlebells  - 1 x daily - 2-3 x weekly - 1 sets - 3 reps - 35ft hold  8/21  -STM to L periscpa mm/mid thoracic ps, L/R mid rib cage region  -combined thoracic and lumbar ext with hands propped on table 2s 2x10  8/14 -UBE L1 4' alternating 1' fwd/1'back -STM to L periscpa mm/mid thoracic ps, L/R mid rib cage region -bilat rib springing T7-12 grade II  -seated thoracic ext with foam roller 5s 2x10 -seated T/S rotation 10x each way 3s  -wall push up 3x8 focus on scap depression -wall angel position with horizontal ABD   Cuing/posture focus on "chest up" vs "head over hips"    PATIENT EDUCATION:  Education details: anatomy, exercise progression, DOMS expectations, muscle firing,  envelope of function, HEP, POC  Person educated: Patient Education method: Explanation, Demonstration, Tactile cues, Verbal cues, and Handouts Education comprehension: verbalized understanding,  returned demonstration, verbal cues required, and tactile cues required  HOME EXERCISE PROGRAM: Access Code: [email or web address] URL: [email or web address] Date: 09/03/2022 Prepared by: Zebedee Iba  ASSESSMENT:  CLINICAL IMPRESSION: Pt upper back mobility and strengthening progressed today without pain or discomfort. Include W position and horizontal ABD is much more tolerable than standing. HEP modified accordingly. Progress/consider weighted scap depression at next. Plan to continue at reduced frequency with PT to continue with postural strength for prevention of further functional decline.  OBJECTIVE IMPAIRMENTS decreased mobility, difficulty walking, decreased ROM, decreased strength, increased muscle spasms, improper body mechanics, postural dysfunction, and pain.    ACTIVITY LIMITATIONS lifting, sitting, standing, squatting, stairs, transfers, locomotion level   PARTICIPATION LIMITATIONS: cleaning, laundry, driving, shopping, community activity, occupation, yard work, and exercise    PERSONAL FACTORS Age, Fitness, Time since onset of injury/illness/exacerbation, and 2+ comorbidity:    are also affecting patient's functional outcome.    REHAB POTENTIAL: Good   CLINICAL DECISION MAKING: unstable/complicated   EVALUATION COMPLEXITY: Moderate     GOALS:     SHORT TERM GOALS: Target date: 10/15/2022      Pt will become independent with HEP in order to demonstrate synthesis of PT education..   Goal status: MET 7/13   2.  Pt will score at least 1 pt increase on FOTO to demonstrate functional improvement in MCII and pt perceived function.      Goal status: MEt   3.  Pt will be able to demonstrate/report ability to sit/stand/sleep for extended periods of time without neck or shoulder discomfort in order to demonstrate functional improvement and tolerance to static positioning.    Goal status: IN PROGRESS (less pain)      LONG TERM GOALS: Target date: 03/02/2023     Pt  will become independent with final HEP in order to demonstrate synthesis of PT education.   Goal status: ongoing   2.  Pt will score >/= 55 on FOTO to demonstrate improvement in perceived bilat knee function.    Goal status: MET   3.  Pt will be able to demonstrate ability to maintain upright posture and alignment during all exercise in order to demonstrate functional improvement in postural awareness    Goal status: ongoing          PLAN: PT FREQUENCY: 1-2x/week   PT DURATION: 12 weeks (likely DC in 6)   PLANNED INTERVENTIONS: Therapeutic exercises, Therapeutic activity, Neuromuscular re-education, Balance training, Gait training, Patient/Family education, Self Care, and Joint mobilization   PLAN FOR NEXT SESSION: anterior stretching, postural strengthening, integration into gym based routine, modified T/S extension     Zebedee Iba, PT 12/15/2022, 3:32 PM

## 2022-12-20 DIAGNOSIS — H5213 Myopia, bilateral: Secondary | ICD-10-CM | POA: Diagnosis not present

## 2022-12-22 ENCOUNTER — Encounter (HOSPITAL_BASED_OUTPATIENT_CLINIC_OR_DEPARTMENT_OTHER): Payer: Self-pay | Admitting: Physical Therapy

## 2022-12-22 ENCOUNTER — Ambulatory Visit (HOSPITAL_BASED_OUTPATIENT_CLINIC_OR_DEPARTMENT_OTHER): Payer: Medicare Other | Admitting: Physical Therapy

## 2022-12-22 DIAGNOSIS — R293 Abnormal posture: Secondary | ICD-10-CM | POA: Diagnosis not present

## 2022-12-22 DIAGNOSIS — R262 Difficulty in walking, not elsewhere classified: Secondary | ICD-10-CM

## 2022-12-22 NOTE — Therapy (Signed)
OUTPATIENT PHYSICAL THERAPY THORACOLUMBAR TREATMENT       Patient Name: Sheri Burns MRN: [id number] DOB:06-02-1955, 67 y.o., female Today's Date: 12/22/2022  END OF SESSION:  PT End of Session - 12/22/22 1331     Visit Number 16    Number of Visits 22    Date for PT Re-Evaluation 03/08/23    Authorization Type BCBS MCR    PT Start Time 1315    PT Stop Time 1353    PT Time Calculation (min) 38 min    Activity Tolerance Patient tolerated treatment well    Behavior During Therapy WFL for tasks assessed/performed                         Past Medical History:  Diagnosis Date   Anxiety    Bulging lumbar disc 1980   Chronic pansinusitis    Colon polyp    Depression    GERD (gastroesophageal reflux disease)    Gynecological examination    sees Dr. Aram Beecham Romine    Headache(784.0)    Hypertension    Multiple sclerosis (HCC) since 1992   Dr. Despina Arias in [facility]   Multiple thyroid nodules 1995   per pt. resolved on its own follwed by endo then released   Neurogenic bladder disorder 12/2009   w/ [facility] dx. Dr. McDiarmid   Neuromuscular disorder [facility])    Multiple sclerosis   Neuropathy    Osteoarthritis    Osteoporosis    last DEXA 2018   Overactive bladder    sees Dr. Lorin Picket McDiarmid    Pancreatitis 1977   Pneumonia    Ulcerative proctitis (HCC)    Vision abnormalities    Past Surgical History:  Procedure Laterality Date   APPENDECTOMY     breast ruptured  left   ductal cyst   CARPAL TUNNEL RELEASE Right    CHOLECYSTECTOMY     COLONOSCOPY  01/10/09   repeat in 5 yrs Dr. Charlott Rakes   CYSTOSCOPY WITH BIOPSY N/A 01/06/2022   Procedure: CYSTOSCOPY WITH BLADDER/URETHRAL BIOPSY;  Surgeon: Sebastian Ache, MD;  Location: [facility];  Service: Urology;  Laterality: N/A;  45 MINS   DILATION AND CURETTAGE OF UTERUS     LAPAROSCOPY     x 3   left knee surgery  1978   to remove bone tumor     NASAL SINUS SURGERY  Bilateral 01/13/2015   Procedure: ENDOSCOPIC SINUS SURGERY;  Surgeon: Serena Colonel, MD;  Location:  SUR[facility]ERY CENTER;  Service: ENT;  Laterality: Bilateral;   NASAL SINUS SURGERY  05/2017   NASAL TURBINATE REDUCTION     pubo vaginal sling  10/2003   TONSILLECTOMY     vaginal cystocele repair     anterior   VAGINAL HYSTERECTOMY  1995   AUB & Endometriosis   widom teeth removal     Patient Active Problem List   Diagnosis Date Noted   Vertigo 08/05/2021   Chronic prescription opiate use 12/14/2018   Carpal tunnel syndrome, bilateral 09/13/2017   Sacroiliac joint pain 09/13/2017   Urinary urgency 06/22/2017   Sciatica, right side 06/17/2016   Dysesthesia 10/30/2015   Right carpal tunnel syndrome 10/30/2015   Sciatica of left side 07/16/2015   Chronic ethmoidal sinusitis 06/26/2015   High risk medication use 07/08/2014   Chronic fatigue 04/02/2014   Dysphagia 04/02/2014   Ataxic gait 04/02/2014   Vitamin D deficiency 06/08/2012   OVERACTIVE BLADDER 08/01/2009  Osteoporosis 08/01/2009   Depression with anxiety 07/04/2007   MULTIPLE SCLEROSIS 07/04/2007   OA (osteoarthritis) of knee 07/04/2007   Headache 07/04/2007   History of colonic polyps 07/04/2007    PCP:  Thana Ates, MD       REFERRING PROVIDER:  Tama Gander, MD     REFERRING DIAG: M41.50 (ICD-10-CM) - Other secondary scoliosis, site unspecified   Rationale for Evaluation and Treatment: Rehabilitation  THERAPY DIAG:  Difficulty walking  Posture abnormality  ONSET DATE: 2023  SUBJECTIVE:                                                                                                                                                                                           SUBJECTIVE STATEMENT:  Pt states she was in pain since last session. It is a dull constant pain. Will also feel pain with getting in and out of the car.   PERTINENT HISTORY:  2/23 fusion of T7-S1 for scoliosis; L knee  surgery medial condyle excision for osteochondritis desiccans; L meniscus tear; MS relapsing remitting; history of vertigo  PAIN:  Are you having pain? Yes 4/10 between shoulder blades   Yes to L knee pain. 6/10   PRECAUTIONS: None   WEIGHT BEARING RESTRICTIONS: No   FALLS:  Has patient fallen in last 6 months? No   LIVING ENVIRONMENT: Lives with: lives alone Lives in: House/apartment Stairs: No Has following equipment at home: [facility] and walker, does not use either    OCCUPATION: ICU nurse, retired    PLOF: Independent with basic ADLs  PATIENT GOALS: prolonging surgery   OBJECTIVE:   DIAGNOSTIC FINDINGS:  N/A recent  PATIENT SURVEYS:  FOTO 55 54 @ DC  FOTO 8/28  67%    SCREENING FOR RED FLAGS: Bowel or bladder incontinence: No Spinal tumors: No Cauda equina syndrome: No Compression fracture: No Abdominal aneurysm: No  COGNITION: Overall cognitive status: Within functional limits for tasks assessed     SENSATION: WFL   POSTURE: rounded shoulders, forward head, and increased thoracic kyphosis  PALPATION: TTP of bilat thoracic parapsinals in T4-6 region, rhomboids, lats  LUMBAR ROM: thoracolumbar ROM limited as expected with T7-S1 fusion; no pain with AROM; no longer painful with SB   Difficulty getting into T/S extension in standing; able to once combined with scapular retraction  CERVICAL ROM:   Active ROM A/PROM (deg) eval 8/28 10/2   Flexion 80 80 80  Extension 60 60 60  Right lateral flexion 35 40 40  Left lateral flexion 35 40 40  Right rotation 75 75 75  Left rotation 75 75 75   (Blank  rows = not tested)   MMT Right 6/28 R  8/28  Left 6/28 L 10/2   Shoulder flexion 4+/5 5/5 4+/5 5/5  Shoulder extension      Shoulder abduction 4+/5 5/5 4+/5 5/5  Shoulder adduction      Shoulder internal rotation 4+/5 5/5 4+/5 5/5  Shoulder external rotation 4/5 4+/5 4/5 4+/5  (Blank rows = not tested)   GAIT: Distance walked: 69ft Assistive  device utilized: None Level of assistance: Complete Independence Comments: WFL  TODAY'S TREATMENT:      10/16  STM to L and R periscap mm/mid thoracic paraspinals-sidelying; STM bilat QL and lumbar paraspinals Modification to HEP ROM   10/9  STM to L and R periscap mm/mid thoracic paraspinals-sidelying  Modified deadbug on inclined table 2x10 (alternating LE extension) Incline W 2x10 Incline table shoulder flexion 3lb weight 3x10 Table include horizontal Abd YTB 2x10 Standing T/S ext and lat stretch using green ball 2x10   10/2  STM to L and R periscap mm/mid thoracic paraspinals-sidelying  Modified deadbug on inclined table 2x10 (alternating LE extension) Bilat ER against wall 2x10 Standing T/S ext and lat stretch using tall end of table 5s 10x    9/24 STM to L and R periscap mm/mid thoracic paraspinals-sidelying Rhomboid stretch in doorway 20sec x2 Bil ER against wall x10 -SA raise against wall RTB x10 -Reverse wall push ups x10 (L knee pain) -Row machine #12- 30# 2x10  -Triceps machine (push down) 25lbs 2x10 -Lat pull at cable column 2x10 20lbs (standing)  -Sidelying open book- 5" x5ea -LAQ with ball sqz x10 -HSC RTB x10    9/20 STM to L periscap mm/mid thoracic paraspinals -Row machine #12- 30# 2x10  -Triceps machine (push down) 25lbs 2x10 -Lat pull at cable column 2x10 (standing)  -Sidelying open bok- 5" x10 -doorway stretch 20sec x2  8/28 -STM to L periscap mm/mid thoracic parspinals  Exercises - Seated Cervical Retraction  - 3-4 x daily - 7 x weekly - 1 sets - 10 reps - Shoulder External Rotation and Scapular Retraction with Resistance  - 3-4 x daily - 7 x weekly - 1 sets - 10 reps - Single Arm Doorway Pec Stretch at 90 Degrees Abduction  - 2 x daily - 7 x weekly - 1 sets - 3 reps - 30 hold - Sidelying Thoracic Rotation with Open Book  - 1 x daily - 7 x weekly - 2 sets - 5 reps - 5-10seconds hold - Seated Row Cable Machine  - 1 x daily - 2-3 x  weekly - 2 sets - 10 reps - Farmer's Carry with Kettlebells  - 1 x daily - 2-3 x weekly - 1 sets - 3 reps - 76ft hold  8/21  -STM to L periscpa mm/mid thoracic ps, L/R mid rib cage region  -combined thoracic and lumbar ext with hands propped on table 2s 2x10  8/14 -UBE L1 4' alternating 1' fwd/1'back -STM to L periscpa mm/mid thoracic ps, L/R mid rib cage region -bilat rib springing T7-12 grade II  -seated thoracic ext with foam roller 5s 2x10 -seated T/S rotation 10x each way 3s  -wall push up 3x8 focus on scap depression -wall angel position with horizontal ABD   Cuing/posture focus on "chest up" vs "head over hips"    PATIENT EDUCATION:  Education details: anatomy, exercise progression, DOMS expectations, muscle firing,  envelope of function, HEP, POC  Person educated: Patient Education method: Explanation, Demonstration, Tactile cues, Verbal cues, and Handouts Education  comprehension: verbalized understanding, returned demonstration, verbal cues required, and tactile cues required  HOME EXERCISE PROGRAM: Access Code: [email or web address] URL: [email or web address] Date: 09/03/2022 Prepared by: Zebedee Iba  ASSESSMENT:  CLINICAL IMPRESSION: Patient report of central pain appears related to soft tissue as well as report of increasing range of motion with home exercise program.  Patient advised to reduce range of motion and gravity assisted position and transition to standing as well as using a solid surface to reduce excessive extension force at the upper and lower back.  Rotationally, QL and lower lat attachment with hypertonicity at today's session which may also account for lower lumbar pain.  Patient advised on using modalities and home exercise modifications moving forward to help manage discomfort.  Patient advised to still continue with range of motion exercises it will continue to help improve her postural awareness and positioning.  OBJECTIVE IMPAIRMENTS decreased  mobility, difficulty walking, decreased ROM, decreased strength, increased muscle spasms, improper body mechanics, postural dysfunction, and pain.    ACTIVITY LIMITATIONS lifting, sitting, standing, squatting, stairs, transfers, locomotion level   PARTICIPATION LIMITATIONS: cleaning, laundry, driving, shopping, community activity, occupation, yard work, and exercise   PERSONAL FACTORS Age, Fitness, Time since onset of injury/illness/exacerbation, and 2+ comorbidity:    are also affecting patient's functional outcome.    REHAB POTENTIAL: Good   CLINICAL DECISION MAKING: unstable/complicated   EVALUATION COMPLEXITY: Moderate     GOALS:     SHORT TERM GOALS: Target date: 10/15/2022      Pt will become independent with HEP in order to demonstrate synthesis of PT education..   Goal status: MET 7/13   2.  Pt will score at least 1 pt increase on FOTO to demonstrate functional improvement in MCII and pt perceived function.      Goal status: MEt   3.  Pt will be able to demonstrate/report ability to sit/stand/sleep for extended periods of time without neck or shoulder discomfort in order to demonstrate functional improvement and tolerance to static positioning.    Goal status: IN PROGRESS (less pain)      LONG TERM GOALS: Target date: 03/02/2023     Pt  will become independent with final HEP in order to demonstrate synthesis of PT education.   Goal status: ongoing   2.  Pt will score >/= 55 on FOTO to demonstrate improvement in perceived bilat knee function.    Goal status: MET   3.  Pt will be able to demonstrate ability to maintain upright posture and alignment during all exercise in order to demonstrate functional improvement in postural awareness    Goal status: ongoing          PLAN: PT FREQUENCY: 1-2x/week   PT DURATION: 12 weeks (likely DC in 6)   PLANNED INTERVENTIONS: Therapeutic exercises, Therapeutic activity, Neuromuscular re-education, Balance training,  Gait training, Patient/Family education, Self Care, and Joint mobilization   PLAN FOR NEXT SESSION: anterior stretching, postural strengthening, integration into gym based routine, modified T/S extension     Zebedee Iba, PT 12/22/2022, 2:01 PM

## 2022-12-27 ENCOUNTER — Encounter (HOSPITAL_BASED_OUTPATIENT_CLINIC_OR_DEPARTMENT_OTHER): Payer: Self-pay | Admitting: Physical Therapy

## 2022-12-29 ENCOUNTER — Ambulatory Visit (HOSPITAL_BASED_OUTPATIENT_CLINIC_OR_DEPARTMENT_OTHER): Payer: Medicare Other | Admitting: Physical Therapy

## 2022-12-29 ENCOUNTER — Encounter (HOSPITAL_BASED_OUTPATIENT_CLINIC_OR_DEPARTMENT_OTHER): Payer: Self-pay | Admitting: Physical Therapy

## 2022-12-29 DIAGNOSIS — R293 Abnormal posture: Secondary | ICD-10-CM

## 2022-12-29 DIAGNOSIS — R262 Difficulty in walking, not elsewhere classified: Secondary | ICD-10-CM

## 2022-12-29 NOTE — Therapy (Signed)
OUTPATIENT PHYSICAL THERAPY THORACOLUMBAR TREATMENT       Patient Name: Sheri Burns MRN: [id number] DOB:Apr 06, 1955, 67 y.o., female Today's Date: 12/29/2022  END OF SESSION:  PT End of Session - 12/29/22 1348     Visit Number 17    Number of Visits 22    Date for PT Re-Evaluation 03/08/23    Authorization Type BCBS MCR    PT Start Time 1320   late check in   PT Stop Time 1353    PT Time Calculation (min) 33 min    Activity Tolerance Patient tolerated treatment well    Behavior During Therapy WFL for tasks assessed/performed                          Past Medical History:  Diagnosis Date   Anxiety    Bulging lumbar disc 1980   Chronic pansinusitis    Colon polyp    Depression    GERD (gastroesophageal reflux disease)    Gynecological examination    sees Dr. Aram Beecham Romine    Headache(784.0)    Hypertension    Multiple sclerosis (HCC) since 1992   Dr. Despina Arias in [facility]   Multiple thyroid nodules 1995   per pt. resolved on its own follwed by endo then released   Neurogenic bladder disorder 12/2009   w/ urge & stress inc. dx. Dr. McDiarmid   Neuromuscular disorder [facility])    Multiple sclerosis   Neuropathy    Osteoarthritis    Osteoporosis    last DEXA 2018   Overactive bladder    sees Dr. Lorin Picket McDiarmid    Pancreatitis 1977   Pneumonia    Ulcerative proctitis (HCC)    Vision abnormalities    Past Surgical History:  Procedure Laterality Date   APPENDECTOMY     breast ruptured  left   ductal cyst   CARPAL TUNNEL RELEASE Right    CHOLECYSTECTOMY     COLONOSCOPY  01/10/09   repeat in 5 yrs Dr. Charlott Rakes   CYSTOSCOPY WITH BIOPSY N/A 01/06/2022   Procedure: CYSTOSCOPY WITH BLADDER/URETHRAL BIOPSY;  Surgeon: Sebastian Ache, MD;  Location: [facility];  Service: Urology;  Laterality: N/A;  45 MINS   DILATION AND CURETTAGE OF UTERUS     LAPAROSCOPY     x 3   left knee surgery  1978   to remove bone tumor     NASAL  SINUS SURGERY Bilateral 01/13/2015   Procedure: ENDOSCOPIC SINUS SURGERY;  Surgeon: Serena Colonel, MD;  Location: [facility];  Service: ENT;  Laterality: Bilateral;   NASAL SINUS SURGERY  05/2017   NASAL TURBINATE REDUCTION     pubo vaginal sling  10/2003   TONSILLECTOMY     vaginal cystocele repair     anterior   VAGINAL HYSTERECTOMY  1995   AUB & Endometriosis   widom teeth removal     Patient Active Problem List   Diagnosis Date Noted   Vertigo 08/05/2021   Chronic prescription opiate use 12/14/2018   Carpal tunnel syndrome, bilateral 09/13/2017   Sacroiliac joint pain 09/13/2017   Urinary urgency 06/22/2017   Sciatica, right side 06/17/2016   Dysesthesia 10/30/2015   Right carpal tunnel syndrome 10/30/2015   Sciatica of left side 07/16/2015   Chronic ethmoidal sinusitis 06/26/2015   High risk medication use 07/08/2014   Chronic fatigue 04/02/2014   Dysphagia 04/02/2014   Ataxic gait 04/02/2014   Vitamin D deficiency 06/08/2012  OVERACTIVE BLADDER 08/01/2009   Osteoporosis 08/01/2009   Depression with anxiety 07/04/2007   MULTIPLE SCLEROSIS 07/04/2007   OA (osteoarthritis) of knee 07/04/2007   Headache 07/04/2007   History of colonic polyps 07/04/2007    PCP:  Thana Ates, MD       REFERRING PROVIDER:  Tama Gander, MD     REFERRING DIAG: M41.50 (ICD-10-CM) - Other secondary scoliosis, site unspecified   Rationale for Evaluation and Treatment: Rehabilitation  THERAPY DIAG:  Difficulty walking  Posture abnormality  ONSET DATE: 2023  SUBJECTIVE:                                                                                                                                                                                           SUBJECTIVE STATEMENT:  Pt states upper back pain has improved but she has more lower/rib type discomfort. Modifying HEP has helped and prevent exacerbation.   PERTINENT HISTORY:  2/23 fusion of T7-S1 for  scoliosis; L knee surgery medial condyle excision for osteochondritis desiccans; L meniscus tear; MS relapsing remitting; history of vertigo  PAIN:  Are you having pain? Yes 4/10 between shoulder blades   Yes to L knee pain. 6/10   PRECAUTIONS: None   WEIGHT BEARING RESTRICTIONS: No   FALLS:  Has patient fallen in last 6 months? No   LIVING ENVIRONMENT: Lives with: lives alone Lives in: House/apartment Stairs: No Has following equipment at home: [facility] and walker, does not use either    OCCUPATION: ICU nurse, retired    PLOF: Independent with basic ADLs  PATIENT GOALS: prolonging surgery   OBJECTIVE:   DIAGNOSTIC FINDINGS:  N/A recent  PATIENT SURVEYS:  FOTO 55 54 @ DC  FOTO 8/28  67%    SCREENING FOR RED FLAGS: Bowel or bladder incontinence: No Spinal tumors: No Cauda equina syndrome: No Compression fracture: No Abdominal aneurysm: No  COGNITION: Overall cognitive status: Within functional limits for tasks assessed     SENSATION: WFL   POSTURE: rounded shoulders, forward head, and increased thoracic kyphosis  PALPATION: TTP of bilat thoracic parapsinals in T4-6 region, rhomboids, lats  LUMBAR ROM: thoracolumbar ROM limited as expected with T7-S1 fusion; no pain with AROM; no longer painful with SB   Difficulty getting into T/S extension in standing; able to once combined with scapular retraction  CERVICAL ROM:   Active ROM A/PROM (deg) eval 8/28 10/2   Flexion 80 80 80  Extension 60 60 60  Right lateral flexion 35 40 40  Left lateral flexion 35 40 40  Right rotation 75 75 75  Left rotation 75 75 75   (Blank rows =  not tested)   MMT Right 6/28 R  8/28  Left 6/28 L 10/2   Shoulder flexion 4+/5 5/5 4+/5 5/5  Shoulder extension      Shoulder abduction 4+/5 5/5 4+/5 5/5  Shoulder adduction      Shoulder internal rotation 4+/5 5/5 4+/5 5/5  Shoulder external rotation 4/5 4+/5 4/5 4+/5  (Blank rows = not tested)   GAIT: Distance  walked: 103ft Assistive device utilized: None Level of assistance: Complete Independence Comments: WFL  TODAY'S TREATMENT:      10/23   STM to L and R periscap mm/mid thoracic paraspinals-sidelying; STM bilat QL and lumbar paraspinals  YTB wall horizontal ABD 3x10 Bent over T 2x10 each UE UBE 2 min retro and fwd Lvl 1     10/16  STM to L and R periscap mm/mid thoracic paraspinals-sidelying; STM bilat QL and lumbar paraspinals Modification to HEP ROM   10/9  STM to L and R periscap mm/mid thoracic paraspinals-sidelying  Modified deadbug on inclined table 2x10 (alternating LE extension) Incline W 2x10 Incline table shoulder flexion 3lb weight 3x10 Table include horizontal Abd YTB 2x10 Standing T/S ext and lat stretch using green ball 2x10   10/2  STM to L and R periscap mm/mid thoracic paraspinals-sidelying  Modified deadbug on inclined table 2x10 (alternating LE extension) Bilat ER against wall 2x10 Standing T/S ext and lat stretch using tall end of table 5s 10x    9/24 STM to L and R periscap mm/mid thoracic paraspinals-sidelying Rhomboid stretch in doorway 20sec x2 Bil ER against wall x10 -SA raise against wall RTB x10 -Reverse wall push ups x10 (L knee pain) -Row machine #12- 30# 2x10  -Triceps machine (push down) 25lbs 2x10 -Lat pull at cable column 2x10 20lbs (standing)  -Sidelying open book- 5" x5ea -LAQ with ball sqz x10 -HSC RTB x10    9/20 STM to L periscap mm/mid thoracic paraspinals -Row machine #12- 30# 2x10  -Triceps machine (push down) 25lbs 2x10 -Lat pull at cable column 2x10 (standing)  -Sidelying open bok- 5" x10 -doorway stretch 20sec x2  8/28 -STM to L periscap mm/mid thoracic parspinals  Exercises - Seated Cervical Retraction  - 3-4 x daily - 7 x weekly - 1 sets - 10 reps - Shoulder External Rotation and Scapular Retraction with Resistance  - 3-4 x daily - 7 x weekly - 1 sets - 10 reps - Single Arm Doorway Pec Stretch at 90  Degrees Abduction  - 2 x daily - 7 x weekly - 1 sets - 3 reps - 30 hold - Sidelying Thoracic Rotation with Open Book  - 1 x daily - 7 x weekly - 2 sets - 5 reps - 5-10seconds hold - Seated Row Cable Machine  - 1 x daily - 2-3 x weekly - 2 sets - 10 reps - Farmer's Carry with Kettlebells  - 1 x daily - 2-3 x weekly - 1 sets - 3 reps - 6ft hold  8/21  -STM to L periscpa mm/mid thoracic ps, L/R mid rib cage region  -combined thoracic and lumbar ext with hands propped on table 2s 2x10  8/14 -UBE L1 4' alternating 1' fwd/1'back -STM to L periscpa mm/mid thoracic ps, L/R mid rib cage region -bilat rib springing T7-12 grade II  -seated thoracic ext with foam roller 5s 2x10 -seated T/S rotation 10x each way 3s  -wall push up 3x8 focus on scap depression -wall angel position with horizontal ABD   Cuing/posture focus on "chest up"  vs "head over hips"    PATIENT EDUCATION:  Education details: anatomy, exercise progression, DOMS expectations, muscle firing,  envelope of function, HEP, POC  Person educated: Patient Education method: Explanation, Demonstration, Tactile cues, Verbal cues, and Handouts Education comprehension: verbalized understanding, returned demonstration, verbal cues required, and tactile cues required  HOME EXERCISE PROGRAM: Access Code: [email or web address] URL: [email or web address] Date: 09/03/2022 Prepared by: Zebedee Iba  ASSESSMENT:  CLINICAL IMPRESSION: Pt able to return to postural exercise and postural endurance today during session with decrease in resting discomfort and pain since last session. Controlling ROM and staying within tolerable ranges reducing lumbar and thoracic irritation. HEP updated today for wall supported horizontal ABD has pt has quickly fatiguing scapular retractors during horizontal ABD. Plan to continue with postural strength as tolerated.  OBJECTIVE IMPAIRMENTS decreased mobility, difficulty walking, decreased ROM, decreased strength,  increased muscle spasms, improper body mechanics, postural dysfunction, and pain.    ACTIVITY LIMITATIONS lifting, sitting, standing, squatting, stairs, transfers, locomotion level   PARTICIPATION LIMITATIONS: cleaning, laundry, driving, shopping, community activity, occupation, yard work, and exercise   PERSONAL FACTORS Age, Fitness, Time since onset of injury/illness/exacerbation, and 2+ comorbidity:    are also affecting patient's functional outcome.    REHAB POTENTIAL: Good   CLINICAL DECISION MAKING: unstable/complicated   EVALUATION COMPLEXITY: Moderate     GOALS:     SHORT TERM GOALS: Target date: 10/15/2022      Pt will become independent with HEP in order to demonstrate synthesis of PT education..   Goal status: MET 7/13   2.  Pt will score at least 1 pt increase on FOTO to demonstrate functional improvement in MCII and pt perceived function.      Goal status: MEt   3.  Pt will be able to demonstrate/report ability to sit/stand/sleep for extended periods of time without neck or shoulder discomfort in order to demonstrate functional improvement and tolerance to static positioning.    Goal status: IN PROGRESS (less pain)      LONG TERM GOALS: Target date: 03/02/2023     Pt  will become independent with final HEP in order to demonstrate synthesis of PT education.   Goal status: ongoing   2.  Pt will score >/= 55 on FOTO to demonstrate improvement in perceived bilat knee function.    Goal status: MET   3.  Pt will be able to demonstrate ability to maintain upright posture and alignment during all exercise in order to demonstrate functional improvement in postural awareness    Goal status: MET          PLAN: PT FREQUENCY: 1-2x/week   PT DURATION: 12 weeks (likely DC in 6)   PLANNED INTERVENTIONS: Therapeutic exercises, Therapeutic activity, Neuromuscular re-education, Balance training, Gait training, Patient/Family education, Self Care, and Joint  mobilization   PLAN FOR NEXT SESSION: anterior stretching, postural strengthening, integration into gym based routine, modified T/S extension     Zebedee Iba, PT 12/29/2022, 1:59 PM

## 2022-12-30 ENCOUNTER — Other Ambulatory Visit: Payer: Self-pay | Admitting: Neurology

## 2022-12-30 NOTE — Telephone Encounter (Signed)
Last seen 08/18/22 and next f/u 1218/24. Last refilled 12/09/22 #120. Too soon for refill.

## 2023-01-04 ENCOUNTER — Other Ambulatory Visit: Payer: Self-pay | Admitting: Neurology

## 2023-01-05 ENCOUNTER — Ambulatory Visit (HOSPITAL_BASED_OUTPATIENT_CLINIC_OR_DEPARTMENT_OTHER): Payer: Medicare Other | Admitting: Physical Therapy

## 2023-01-05 DIAGNOSIS — R262 Difficulty in walking, not elsewhere classified: Secondary | ICD-10-CM | POA: Diagnosis not present

## 2023-01-05 DIAGNOSIS — R293 Abnormal posture: Secondary | ICD-10-CM

## 2023-01-05 NOTE — Therapy (Signed)
OUTPATIENT PHYSICAL THERAPY THORACOLUMBAR TREATMENT       Patient Name: Sheri Burns MRN: [id number] DOB:1955-06-13, 67 y.o., female Today's Date: 01/05/2023  END OF SESSION:  PT End of Session - 01/05/23 1531     Visit Number 18    Number of Visits 22    Date for PT Re-Evaluation 03/08/23    Authorization Type BCBS MCR    PT Start Time 1448    PT Stop Time 1526    PT Time Calculation (min) 38 min    Activity Tolerance Patient tolerated treatment well    Behavior During Therapy WFL for tasks assessed/performed                           Past Medical History:  Diagnosis Date   Anxiety    Bulging lumbar disc 1980   Chronic pansinusitis    Colon polyp    Depression    GERD (gastroesophageal reflux disease)    Gynecological examination    sees Dr. Aram Beecham Romine    Headache(784.0)    Hypertension    Multiple sclerosis (HCC) since 1992   Dr. Despina Arias in [facility]   Multiple thyroid nodules 1995   per pt. resolved on its own follwed by endo then released   Neurogenic bladder disorder 12/2009   w/ urge & stress inc. dx. Dr. McDiarmid   Neuromuscular disorder [facility])    Multiple sclerosis   Neuropathy    Osteoarthritis    Osteoporosis    last DEXA 2018   Overactive bladder    sees Dr. Lorin Picket McDiarmid    Pancreatitis 1977   Pneumonia    Ulcerative proctitis (HCC)    Vision abnormalities    Past Surgical History:  Procedure Laterality Date   APPENDECTOMY     breast ruptured  left   ductal cyst   CARPAL TUNNEL RELEASE Right    CHOLECYSTECTOMY     COLONOSCOPY  01/10/09   repeat in 5 yrs Dr. Charlott Rakes   CYSTOSCOPY WITH BIOPSY N/A 01/06/2022   Procedure: CYSTOSCOPY WITH BLADDER/URETHRAL BIOPSY;  Surgeon: Sebastian Ache, MD;  Location: [facility];  Service: Urology;  Laterality: N/A;  45 MINS   DILATION AND CURETTAGE OF UTERUS     LAPAROSCOPY     x 3   left knee surgery  1978   to remove bone tumor     NASAL SINUS SURGERY  Bilateral 01/13/2015   Procedure: ENDOSCOPIC SINUS SURGERY;  Surgeon: Serena Colonel, MD;  Location: [facility];  Service: ENT;  Laterality: Bilateral;   NASAL SINUS SURGERY  05/2017   NASAL TURBINATE REDUCTION     pubo vaginal sling  10/2003   TONSILLECTOMY     vaginal cystocele repair     anterior   VAGINAL HYSTERECTOMY  1995   AUB & Endometriosis   widom teeth removal     Patient Active Problem List   Diagnosis Date Noted   Vertigo 08/05/2021   Chronic prescription opiate use 12/14/2018   Carpal tunnel syndrome, bilateral 09/13/2017   Sacroiliac joint pain 09/13/2017   Urinary urgency 06/22/2017   Sciatica, right side 06/17/2016   Dysesthesia 10/30/2015   Right carpal tunnel syndrome 10/30/2015   Sciatica of left side 07/16/2015   Chronic ethmoidal sinusitis 06/26/2015   High risk medication use 07/08/2014   Chronic fatigue 04/02/2014   Dysphagia 04/02/2014   Ataxic gait 04/02/2014   Vitamin D deficiency 06/08/2012   OVERACTIVE BLADDER  08/01/2009   Osteoporosis 08/01/2009   Depression with anxiety 07/04/2007   MULTIPLE SCLEROSIS 07/04/2007   OA (osteoarthritis) of knee 07/04/2007   Headache 07/04/2007   History of colonic polyps 07/04/2007    PCP:  Thana Ates, MD       REFERRING PROVIDER:  Tama Gander, MD     REFERRING DIAG: M41.50 (ICD-10-CM) - Other secondary scoliosis, site unspecified   Rationale for Evaluation and Treatment: Rehabilitation  THERAPY DIAG:  Difficulty walking  Posture abnormality  ONSET DATE: 2023  SUBJECTIVE:                                                                                                                                                                                           SUBJECTIVE STATEMENT:  Pt states that the lower back pain is still better than last time. Pt's R scapular mm is tight and painful currently, around lower should blade  PERTINENT HISTORY:  2/23 fusion of T7-S1 for scoliosis;  L knee surgery medial condyle excision for osteochondritis desiccans; L meniscus tear; MS relapsing remitting; history of vertigo  PAIN:  Are you having pain? Yes 2/10 between shoulder blades   Yes to L knee pain. 6/10   PRECAUTIONS: None   WEIGHT BEARING RESTRICTIONS: No   FALLS:  Has patient fallen in last 6 months? No   LIVING ENVIRONMENT: Lives with: lives alone Lives in: House/apartment Stairs: No Has following equipment at home: [facility] and walker, does not use either    OCCUPATION: ICU nurse, retired    PLOF: Independent with basic ADLs  PATIENT GOALS: prolonging surgery   OBJECTIVE:   DIAGNOSTIC FINDINGS:  N/A recent  PATIENT SURVEYS:  FOTO 55 54 @ DC  FOTO 8/28  67%    SCREENING FOR RED FLAGS: Bowel or bladder incontinence: No Spinal tumors: No Cauda equina syndrome: No Compression fracture: No Abdominal aneurysm: No  COGNITION: Overall cognitive status: Within functional limits for tasks assessed     SENSATION: WFL   POSTURE: rounded shoulders, forward head, and increased thoracic kyphosis  PALPATION: TTP of bilat thoracic parapsinals in T4-6 region, rhomboids, lats  LUMBAR ROM: thoracolumbar ROM limited as expected with T7-S1 fusion; no pain with AROM; no longer painful with SB   Difficulty getting into T/S extension in standing; able to once combined with scapular retraction  CERVICAL ROM:   Active ROM A/PROM (deg) eval 8/28 10/2   Flexion 80 80 80  Extension 60 60 60  Right lateral flexion 35 40 40  Left lateral flexion 35 40 40  Right rotation 75 75 75  Left rotation 75 75 75   (  Blank rows = not tested)   MMT Right 6/28 R  8/28  Left 6/28 L 10/2   Shoulder flexion 4+/5 5/5 4+/5 5/5  Shoulder extension      Shoulder abduction 4+/5 5/5 4+/5 5/5  Shoulder adduction      Shoulder internal rotation 4+/5 5/5 4+/5 5/5  Shoulder external rotation 4/5 4+/5 4/5 4+/5  (Blank rows = not tested)   GAIT: Distance walked:  13ft Assistive device utilized: None Level of assistance: Complete Independence Comments: WFL  TODAY'S TREATMENT:      10/30  STM to L and R periscap mm/mid thoracic paraspinals-sidelying; STM bilat QL and lumbar paraspinals  Straight arm shoulder ext 10lbs 3x10 Standing T vs prone/bent over T for home RTB shoulder ER    10/23   STM to L and R periscap mm/mid thoracic paraspinals-sidelying; STM bilat QL and lumbar paraspinals  YTB wall horizontal ABD 3x10 Bent over T 2x10 each UE UBE 2 min retro and fwd Lvl 1     10/16  STM to L and R periscap mm/mid thoracic paraspinals-sidelying; STM bilat QL and lumbar paraspinals Modification to HEP ROM   10/9  STM to L and R periscap mm/mid thoracic paraspinals-sidelying  Modified deadbug on inclined table 2x10 (alternating LE extension) Incline W 2x10 Incline table shoulder flexion 3lb weight 3x10 Table include horizontal Abd YTB 2x10 Standing T/S ext and lat stretch using green ball 2x10   10/2  STM to L and R periscap mm/mid thoracic paraspinals-sidelying  Modified deadbug on inclined table 2x10 (alternating LE extension) Bilat ER against wall 2x10 Standing T/S ext and lat stretch using tall end of table 5s 10x    9/24 STM to L and R periscap mm/mid thoracic paraspinals-sidelying Rhomboid stretch in doorway 20sec x2 Bil ER against wall x10 -SA raise against wall RTB x10 -Reverse wall push ups x10 (L knee pain) -Row machine #12- 30# 2x10  -Triceps machine (push down) 25lbs 2x10 -Lat pull at cable column 2x10 20lbs (standing)  -Sidelying open book- 5" x5ea -LAQ with ball sqz x10 -HSC RTB x10    9/20 STM to L periscap mm/mid thoracic paraspinals -Row machine #12- 30# 2x10  -Triceps machine (push down) 25lbs 2x10 -Lat pull at cable column 2x10 (standing)  -Sidelying open bok- 5" x10 -doorway stretch 20sec x2  8/28 -STM to L periscap mm/mid thoracic parspinals  Exercises - Seated Cervical Retraction  -  3-4 x daily - 7 x weekly - 1 sets - 10 reps - Shoulder External Rotation and Scapular Retraction with Resistance  - 3-4 x daily - 7 x weekly - 1 sets - 10 reps - Single Arm Doorway Pec Stretch at 90 Degrees Abduction  - 2 x daily - 7 x weekly - 1 sets - 3 reps - 30 hold - Sidelying Thoracic Rotation with Open Book  - 1 x daily - 7 x weekly - 2 sets - 5 reps - 5-10seconds hold - Seated Row Cable Machine  - 1 x daily - 2-3 x weekly - 2 sets - 10 reps - Farmer's Carry with Kettlebells  - 1 x daily - 2-3 x weekly - 1 sets - 3 reps - 31ft hold  8/21  -STM to L periscpa mm/mid thoracic ps, L/R mid rib cage region  -combined thoracic and lumbar ext with hands propped on table 2s 2x10  8/14 -UBE L1 4' alternating 1' fwd/1'back -STM to L periscpa mm/mid thoracic ps, L/R mid rib cage region -bilat rib springing  T7-12 grade II  -seated thoracic ext with foam roller 5s 2x10 -seated T/S rotation 10x each way 3s  -wall push up 3x8 focus on scap depression -wall angel position with horizontal ABD   Cuing/posture focus on "chest up" vs "head over hips"    PATIENT EDUCATION:  Education details: anatomy, exercise progression, DOMS expectations, muscle firing,  envelope of function, HEP, POC  Person educated: Patient Education method: Explanation, Demonstration, Tactile cues, Verbal cues, and Handouts Education comprehension: verbalized understanding, returned demonstration, verbal cues required, and tactile cues required  HOME EXERCISE PROGRAM: Access Code: [email or web address] URL: [email or web address] Date: 09/03/2022 Prepared by: Zebedee Iba  ASSESSMENT:  CLINICAL IMPRESSION:  Pt with improvement in soft tissue extensibility following manual therapy and able to update postural strength in HEP. Plan to continue with postural strength as tolerated. No pain noted during session.Pt likely to D/C in 3-4 visits as we finalize her home HEP. Pt to return to therapy at a later date for for L  knee pre-hab for TKA.   OBJECTIVE IMPAIRMENTS decreased mobility, difficulty walking, decreased ROM, decreased strength, increased muscle spasms, improper body mechanics, postural dysfunction, and pain.    ACTIVITY LIMITATIONS lifting, sitting, standing, squatting, stairs, transfers, locomotion level   PARTICIPATION LIMITATIONS: cleaning, laundry, driving, shopping, community activity, occupation, yard work, and exercise   PERSONAL FACTORS Age, Fitness, Time since onset of injury/illness/exacerbation, and 2+ comorbidity:    are also affecting patient's functional outcome.    REHAB POTENTIAL: Good   CLINICAL DECISION MAKING: unstable/complicated   EVALUATION COMPLEXITY: Moderate     GOALS:     SHORT TERM GOALS: Target date: 10/15/2022      Pt will become independent with HEP in order to demonstrate synthesis of PT education..   Goal status: MET 7/13   2.  Pt will score at least 1 pt increase on FOTO to demonstrate functional improvement in MCII and pt perceived function.      Goal status: MEt   3.  Pt will be able to demonstrate/report ability to sit/stand/sleep for extended periods of time without neck or shoulder discomfort in order to demonstrate functional improvement and tolerance to static positioning.    Goal status: IN PROGRESS (less pain)      LONG TERM GOALS: Target date: 03/02/2023     Pt  will become independent with final HEP in order to demonstrate synthesis of PT education.   Goal status: ongoing   2.  Pt will score >/= 55 on FOTO to demonstrate improvement in perceived bilat knee function.    Goal status: MET   3.  Pt will be able to demonstrate ability to maintain upright posture and alignment during all exercise in order to demonstrate functional improvement in postural awareness    Goal status: MET          PLAN: PT FREQUENCY: 1-2x/week   PT DURATION: 12 weeks (likely DC in 6)   PLANNED INTERVENTIONS: Therapeutic exercises, Therapeutic  activity, Neuromuscular re-education, Balance training, Gait training, Patient/Family education, Self Care, and Joint mobilization   PLAN FOR NEXT SESSION: anterior stretching, postural strengthening, integration into gym based routine, modified T/S extension     Zebedee Iba, PT 01/05/2023, 3:34 PM

## 2023-01-05 NOTE — Telephone Encounter (Signed)
Last seen on 08/18/22 Follow up scheduled 02/23/23 Last filled on 12/09/22 #120 tablets (30 day supply) Rx pending to be signed

## 2023-01-25 ENCOUNTER — Ambulatory Visit (HOSPITAL_BASED_OUTPATIENT_CLINIC_OR_DEPARTMENT_OTHER): Payer: Medicare Other | Attending: Family Medicine

## 2023-01-25 ENCOUNTER — Encounter (HOSPITAL_BASED_OUTPATIENT_CLINIC_OR_DEPARTMENT_OTHER): Payer: Self-pay

## 2023-02-01 DIAGNOSIS — M4004 Postural kyphosis, thoracic region: Secondary | ICD-10-CM | POA: Diagnosis not present

## 2023-02-01 DIAGNOSIS — N3946 Mixed incontinence: Secondary | ICD-10-CM | POA: Diagnosis not present

## 2023-02-01 DIAGNOSIS — N319 Neuromuscular dysfunction of bladder, unspecified: Secondary | ICD-10-CM | POA: Diagnosis not present

## 2023-02-04 ENCOUNTER — Other Ambulatory Visit: Payer: Self-pay | Admitting: Neurology

## 2023-02-08 NOTE — Telephone Encounter (Signed)
Last seen on 08/18/22 Follow up scheduled on 02/23/23 Last filled on 01/27/23 #60 tablets(30 day supply) Rx pending to be signed

## 2023-02-09 ENCOUNTER — Ambulatory Visit (HOSPITAL_BASED_OUTPATIENT_CLINIC_OR_DEPARTMENT_OTHER): Payer: Medicare Other | Attending: Family Medicine | Admitting: Physical Therapy

## 2023-02-09 ENCOUNTER — Other Ambulatory Visit (HOSPITAL_BASED_OUTPATIENT_CLINIC_OR_DEPARTMENT_OTHER): Payer: Self-pay

## 2023-02-09 ENCOUNTER — Encounter (HOSPITAL_BASED_OUTPATIENT_CLINIC_OR_DEPARTMENT_OTHER): Payer: Self-pay | Admitting: Physical Therapy

## 2023-02-09 DIAGNOSIS — M810 Age-related osteoporosis without current pathological fracture: Secondary | ICD-10-CM | POA: Diagnosis not present

## 2023-02-09 DIAGNOSIS — M25662 Stiffness of left knee, not elsewhere classified: Secondary | ICD-10-CM | POA: Insufficient documentation

## 2023-02-09 DIAGNOSIS — G35 Multiple sclerosis: Secondary | ICD-10-CM | POA: Diagnosis not present

## 2023-02-09 DIAGNOSIS — E781 Pure hyperglyceridemia: Secondary | ICD-10-CM | POA: Diagnosis not present

## 2023-02-09 DIAGNOSIS — M6281 Muscle weakness (generalized): Secondary | ICD-10-CM | POA: Insufficient documentation

## 2023-02-09 DIAGNOSIS — Z79899 Other long term (current) drug therapy: Secondary | ICD-10-CM | POA: Diagnosis not present

## 2023-02-09 DIAGNOSIS — F325 Major depressive disorder, single episode, in full remission: Secondary | ICD-10-CM | POA: Diagnosis not present

## 2023-02-09 DIAGNOSIS — M25562 Pain in left knee: Secondary | ICD-10-CM | POA: Insufficient documentation

## 2023-02-09 DIAGNOSIS — R262 Difficulty in walking, not elsewhere classified: Secondary | ICD-10-CM | POA: Diagnosis not present

## 2023-02-09 DIAGNOSIS — R293 Abnormal posture: Secondary | ICD-10-CM | POA: Insufficient documentation

## 2023-02-09 DIAGNOSIS — Z0000 Encounter for general adult medical examination without abnormal findings: Secondary | ICD-10-CM | POA: Diagnosis not present

## 2023-02-09 DIAGNOSIS — Z1331 Encounter for screening for depression: Secondary | ICD-10-CM | POA: Diagnosis not present

## 2023-02-09 DIAGNOSIS — D84821 Immunodeficiency due to drugs: Secondary | ICD-10-CM | POA: Diagnosis not present

## 2023-02-09 MED ORDER — COMIRNATY 30 MCG/0.3ML IM SUSY
PREFILLED_SYRINGE | INTRAMUSCULAR | 0 refills | Status: DC
  Filled 2023-02-09: qty 0.3, 1d supply, fill #0

## 2023-02-09 NOTE — Therapy (Signed)
OUTPATIENT PHYSICAL THERAPY THORACOLUMBAR TREATMENT       Patient Name: Sheri Burns MRN: [id number] DOB:[birth year], 67 y.o., female Today's Date: 02/09/2023  END OF SESSION:  PT End of Session - 02/09/23 1020     Visit Number 19    Number of Visits 22    Date for PT Re-Evaluation 03/08/23    Authorization Type BCBS MCR    PT Start Time 1016    PT Stop Time 1055    PT Time Calculation (min) 39 min    Activity Tolerance Patient tolerated treatment well    Behavior During Therapy WFL for tasks assessed/performed                            Past Medical History:  Diagnosis Date   Anxiety    Bulging lumbar disc 1980   Chronic pansinusitis    Colon polyp    Depression    GERD (gastroesophageal reflux disease)    Gynecological examination    sees Dr. Aram Beecham Romine    Headache(784.0)    Hypertension    Multiple sclerosis (HCC) since 1992   Dr. Despina Arias in [facility]   Multiple thyroid nodules 1995   per pt. resolved on its own follwed by endo then released   Neurogenic bladder disorder 12/2009   w/ [facility] dx. Dr. McDiarmid   Neuromuscular disorder [facility])    Multiple sclerosis   Neuropathy    Osteoarthritis    Osteoporosis    last DEXA 2018   Overactive bladder    sees Dr. Lorin Picket McDiarmid    Pancreatitis 1977   Pneumonia    Ulcerative proctitis (HCC)    Vision abnormalities    Past Surgical History:  Procedure Laterality Date   APPENDECTOMY     breast ruptured  left   ductal cyst   CARPAL TUNNEL RELEASE Right    CHOLECYSTECTOMY     COLONOSCOPY  01/10/09   repeat in 5 yrs Dr. Charlott Rakes   CYSTOSCOPY WITH BIOPSY N/A 01/06/2022   Procedure: CYSTOSCOPY WITH BLADDER/URETHRAL BIOPSY;  Surgeon: Sebastian Ache, MD;  Location: [facility];  Service: Urology;  Laterality: N/A;  45 MINS   DILATION AND CURETTAGE OF UTERUS     LAPAROSCOPY     x 3   left knee surgery  1978   to remove bone tumor     NASAL SINUS SURGERY  Bilateral 01/13/2015   Procedure: ENDOSCOPIC SINUS SURGERY;  Surgeon: Serena Colonel, MD;  Location: [facility];  Service: ENT;  Laterality: Bilateral;   NASAL SINUS SURGERY  05/2017   NASAL TURBINATE REDUCTION     pubo vaginal sling  10/2003   TONSILLECTOMY     vaginal cystocele repair     anterior   VAGINAL HYSTERECTOMY  1995   AUB & Endometriosis   widom teeth removal     Patient Active Problem List   Diagnosis Date Noted   Vertigo 08/05/2021   Chronic prescription opiate use 12/14/2018   Carpal tunnel syndrome, bilateral 09/13/2017   Sacroiliac joint pain 09/13/2017   Urinary urgency 06/22/2017   Sciatica, right side 06/17/2016   Dysesthesia 10/30/2015   Right carpal tunnel syndrome 10/30/2015   Sciatica of left side 07/16/2015   Chronic ethmoidal sinusitis 06/26/2015   High risk medication use 07/08/2014   Chronic fatigue 04/02/2014   Dysphagia 04/02/2014   Ataxic gait 04/02/2014   Vitamin D deficiency 06/08/2012   OVERACTIVE  BLADDER 08/01/2009   Osteoporosis 08/01/2009   Depression with anxiety 07/04/2007   MULTIPLE SCLEROSIS 07/04/2007   OA (osteoarthritis) of knee 07/04/2007   Headache 07/04/2007   History of colonic polyps 07/04/2007    PCP:  Thana Ates, MD       REFERRING PROVIDER:  Tama Gander, MD     REFERRING DIAG: M41.50 (ICD-10-CM) - Other secondary scoliosis, site unspecified   Rationale for Evaluation and Treatment: Rehabilitation  THERAPY DIAG:  Difficulty walking  Posture abnormality  ONSET DATE: 2023  SUBJECTIVE:                                                                                                                                                                                           SUBJECTIVE STATEMENT:  Pt returns today from her trip. Trekking poles and pulling up walking uphills were uncomfortable into her upper back. Pt had an x-ray  last Tuesday without change in narrowing from last imaging. The L knee  is very bad and greatly limiting daily mobility. She feels it gets stuck at a certain point. Has TKA scheduled for 03/14/23  PERTINENT HISTORY:  2/23 fusion of T7-S1 for scoliosis; L knee surgery medial condyle excision for osteochondritis desiccans; L meniscus tear; MS relapsing remitting; history of vertigo  PAIN:  Are you having pain? Yes 2/10 between shoulder blades   Yes to L knee pain. 6/10   PRECAUTIONS: None   WEIGHT BEARING RESTRICTIONS: No   FALLS:  Has patient fallen in last 6 months? No   LIVING ENVIRONMENT: Lives with: lives alone Lives in: House/apartment Stairs: No Has following equipment at home: [facility] and walker, does not use either    OCCUPATION: ICU nurse, retired    PLOF: Independent with basic ADLs  PATIENT GOALS: prolonging surgery   OBJECTIVE:   DIAGNOSTIC FINDINGS:  N/A recent  PATIENT SURVEYS:  FOTO 55 54 @ DC  FOTO 8/28  67%    SCREENING FOR RED FLAGS: Bowel or bladder incontinence: No Spinal tumors: No Cauda equina syndrome: No Compression fracture: No Abdominal aneurysm: No  COGNITION: Overall cognitive status: Within functional limits for tasks assessed     SENSATION: WFL   POSTURE: rounded shoulders, forward head, and increased thoracic kyphosis  PALPATION: TTP of bilat thoracic parapsinals in T4-6 region, rhomboids, lats  LUMBAR ROM: thoracolumbar ROM limited as expected with T7-S1 fusion; no pain with AROM; no longer painful with SB   Difficulty getting into T/S extension in standing; able to once combined with scapular retraction  CERVICAL ROM:   Active ROM A/PROM (deg) eval 8/28 10/2   Flexion 80 80 80  Extension 60 60 60  Right lateral flexion 35 40 40  Left lateral flexion 35 40 40  Right rotation 75 75 75  Left rotation 75 75 75   (Blank rows = not tested)   MMT Right 6/28 R  8/28  Left 6/28 L 10/2   Shoulder flexion 4+/5 5/5 4+/5 5/5  Shoulder extension      Shoulder abduction 4+/5 5/5 4+/5 5/5   Shoulder adduction      Shoulder internal rotation 4+/5 5/5 4+/5 5/5  Shoulder external rotation 4/5 4+/5 4/5 4+/5  (Blank rows = not tested)   GAIT: Distance walked: 71ft Assistive device utilized: None Level of assistance: Complete Independence Comments: WFL  TODAY'S TREATMENT:      12/4   STM to L and R periscap mm/mid thoracic paraspinals-sidelying; STM bilat QL and lumbar paraspinals  UBE retro and fwd 3 min each Pulleys for lat stretch 2 mins Horizontal ABD YTB 2x10    10/30  STM to L and R periscap mm/mid thoracic paraspinals-sidelying; STM bilat QL and lumbar paraspinals  Straight arm shoulder ext 10lbs 3x10 Standing T vs prone/bent over T for home RTB shoulder ER    10/23   STM to L and R periscap mm/mid thoracic paraspinals-sidelying; STM bilat QL and lumbar paraspinals  YTB wall horizontal ABD 3x10 Bent over T 2x10 each UE UBE 2 min retro and fwd Lvl 1     10/16  STM to L and R periscap mm/mid thoracic paraspinals-sidelying; STM bilat QL and lumbar paraspinals Modification to HEP ROM   10/9  STM to L and R periscap mm/mid thoracic paraspinals-sidelying  Modified deadbug on inclined table 2x10 (alternating LE extension) Incline W 2x10 Incline table shoulder flexion 3lb weight 3x10 Table include horizontal Abd YTB 2x10 Standing T/S ext and lat stretch using green ball 2x10   10/2  STM to L and R periscap mm/mid thoracic paraspinals-sidelying  Modified deadbug on inclined table 2x10 (alternating LE extension) Bilat ER against wall 2x10 Standing T/S ext and lat stretch using tall end of table 5s 10x    9/24 STM to L and R periscap mm/mid thoracic paraspinals-sidelying Rhomboid stretch in doorway 20sec x2 Bil ER against wall x10 -SA raise against wall RTB x10 -Reverse wall push ups x10 (L knee pain) -Row machine #12- 30# 2x10  -Triceps machine (push down) 25lbs 2x10 -Lat pull at cable column 2x10 20lbs (standing)  -Sidelying open  book- 5" x5ea -LAQ with ball sqz x10 -HSC RTB x10    9/20 STM to L periscap mm/mid thoracic paraspinals -Row machine #12- 30# 2x10  -Triceps machine (push down) 25lbs 2x10 -Lat pull at cable column 2x10 (standing)  -Sidelying open bok- 5" x10 -doorway stretch 20sec x2  8/28 -STM to L periscap mm/mid thoracic parspinals  Exercises - Seated Cervical Retraction  - 3-4 x daily - 7 x weekly - 1 sets - 10 reps - Shoulder External Rotation and Scapular Retraction with Resistance  - 3-4 x daily - 7 x weekly - 1 sets - 10 reps - Single Arm Doorway Pec Stretch at 90 Degrees Abduction  - 2 x daily - 7 x weekly - 1 sets - 3 reps - 30 hold - Sidelying Thoracic Rotation with Open Book  - 1 x daily - 7 x weekly - 2 sets - 5 reps - 5-10seconds hold - Seated Row Cable Machine  - 1 x daily - 2-3 x weekly - 2 sets - 10 reps - [employer]  with Kettlebells  - 1 x daily - 2-3 x weekly - 1 sets - 3 reps - 33ft hold  8/21  -STM to L periscpa mm/mid thoracic ps, L/R mid rib cage region  -combined thoracic and lumbar ext with hands propped on table 2s 2x10  8/14 -UBE L1 4' alternating 1' fwd/1'back -STM to L periscpa mm/mid thoracic ps, L/R mid rib cage region -bilat rib springing T7-12 grade II  -seated thoracic ext with foam roller 5s 2x10 -seated T/S rotation 10x each way 3s  -wall push up 3x8 focus on scap depression -wall angel position with horizontal ABD   Cuing/posture focus on "chest up" vs "head over hips"    PATIENT EDUCATION:  Education details: anatomy, exercise progression, DOMS expectations, muscle firing,  envelope of function, HEP, POC  Person educated: Patient Education method: Explanation, Demonstration, Tactile cues, Verbal cues, and Handouts Education comprehension: verbalized understanding, returned demonstration, verbal cues required, and tactile cues required  HOME EXERCISE PROGRAM: Access Code: [email or web address] URL: [email or web address] Date:  09/03/2022 Prepared by: Zebedee Iba  ASSESSMENT:  CLINICAL IMPRESSION:  Pt return to therapy after being out of town and recent illness. Pt has lower lat and lumbar tightness likely due to recently increase in use of walking sticks as AD due to L knee pain. Pt re-established PT HEP today for postural deficits. Pt to call MD for referral for L knee prehab as that is limit her mobility greatly and is causing more pain with daily ambulation. Pt would benefit from continued skilled therapy in order to reach goals and maximize functional  postural and L knee strength and ROM for prevention of further functional decline and to improve post-operative outcomes.   OBJECTIVE IMPAIRMENTS decreased mobility, difficulty walking, decreased ROM, decreased strength, increased muscle spasms, improper body mechanics, postural dysfunction, and pain.    ACTIVITY LIMITATIONS lifting, sitting, standing, squatting, stairs, transfers, locomotion level   PARTICIPATION LIMITATIONS: cleaning, laundry, driving, shopping, community activity, occupation, yard work, and exercise   PERSONAL FACTORS Age, Fitness, Time since onset of injury/illness/exacerbation, and 2+ comorbidity:    are also affecting patient's functional outcome.    REHAB POTENTIAL: Good   CLINICAL DECISION MAKING: unstable/complicated   EVALUATION COMPLEXITY: Moderate     GOALS:     SHORT TERM GOALS: Target date: 10/15/2022      Pt will become independent with HEP in order to demonstrate synthesis of PT education..   Goal status: MET 7/13   2.  Pt will score at least 1 pt increase on FOTO to demonstrate functional improvement in MCII and pt perceived function.      Goal status: MEt   3.  Pt will be able to demonstrate/report ability to sit/stand/sleep for extended periods of time without neck or shoulder discomfort in order to demonstrate functional improvement and tolerance to static positioning.    Goal status: IN PROGRESS (less pain)       LONG TERM GOALS: Target date: 03/02/2023     Pt  will become independent with final HEP in order to demonstrate synthesis of PT education.   Goal status: ongoing   2.  Pt will score >/= 55 on FOTO to demonstrate improvement in perceived bilat knee function.    Goal status: MET   3.  Pt will be able to demonstrate ability to maintain upright posture and alignment during all exercise in order to demonstrate functional improvement in postural awareness    Goal status: MET  PLAN: PT FREQUENCY: 1-2x/week   PT DURATION: 12 weeks (likely DC in 6)   PLANNED INTERVENTIONS: Therapeutic exercises, Therapeutic activity, Neuromuscular re-education, Balance training, Gait training, Patient/Family education, Self Care, and Joint mobilization   PLAN FOR NEXT SESSION: anterior stretching, postural strengthening, integration into gym based routine, modified T/S extension     Zebedee Iba, PT 02/09/2023, 11:30 AM

## 2023-02-14 ENCOUNTER — Other Ambulatory Visit: Payer: Self-pay

## 2023-02-14 MED ORDER — GABAPENTIN 600 MG PO TABS: 600.0000 mg | ORAL_TABLET | Freq: Four times a day (QID) | ORAL | 0 refills | Status: DC

## 2023-02-16 ENCOUNTER — Ambulatory Visit (HOSPITAL_BASED_OUTPATIENT_CLINIC_OR_DEPARTMENT_OTHER): Payer: Medicare Other | Admitting: Physical Therapy

## 2023-02-16 ENCOUNTER — Encounter (HOSPITAL_BASED_OUTPATIENT_CLINIC_OR_DEPARTMENT_OTHER): Payer: Self-pay | Admitting: Physical Therapy

## 2023-02-16 DIAGNOSIS — R262 Difficulty in walking, not elsewhere classified: Secondary | ICD-10-CM

## 2023-02-16 DIAGNOSIS — M25562 Pain in left knee: Secondary | ICD-10-CM | POA: Diagnosis not present

## 2023-02-16 DIAGNOSIS — R293 Abnormal posture: Secondary | ICD-10-CM

## 2023-02-16 DIAGNOSIS — M6281 Muscle weakness (generalized): Secondary | ICD-10-CM | POA: Diagnosis not present

## 2023-02-16 DIAGNOSIS — M25662 Stiffness of left knee, not elsewhere classified: Secondary | ICD-10-CM | POA: Diagnosis not present

## 2023-02-16 NOTE — Therapy (Signed)
OUTPATIENT PHYSICAL THERAPY THORACOLUMBAR TREATMENT       Patient Name: Sheri Burns MRN: [id number] DOB:Nov 03, 1955, 67 y.o., female Today's Date: 02/16/2023  END OF SESSION:  PT End of Session - 02/16/23 1017     Visit Number 20    Number of Visits 26    Date for PT Re-Evaluation 03/13/23    Authorization Type BCBS MCR    PT Start Time 1015    PT Stop Time 1057    PT Time Calculation (min) 42 min    Activity Tolerance Patient tolerated treatment well    Behavior During Therapy WFL for tasks assessed/performed                             Past Medical History:  Diagnosis Date   Anxiety    Bulging lumbar disc 1980   Chronic pansinusitis    Colon polyp    Depression    GERD (gastroesophageal reflux disease)    Gynecological examination    sees Dr. Aram Beecham Romine    Headache(784.0)    Hypertension    Multiple sclerosis (HCC) since 1992   Dr. Despina Arias in [facility]   Multiple thyroid nodules 1995   per pt. resolved on its own follwed by endo then released   Neurogenic bladder disorder 12/2009   w/ [facility] dx. Dr. McDiarmid   Neuromuscular disorder [facility])    Multiple sclerosis   Neuropathy    Osteoarthritis    Osteoporosis    last DEXA 2018   Overactive bladder    sees Dr. Lorin Picket McDiarmid    Pancreatitis 1977   Pneumonia    Ulcerative proctitis (HCC)    Vision abnormalities    Past Surgical History:  Procedure Laterality Date   APPENDECTOMY     breast ruptured  left   ductal cyst   CARPAL TUNNEL RELEASE Right    CHOLECYSTECTOMY     COLONOSCOPY  01/10/09   repeat in 5 yrs Dr. Charlott Rakes   CYSTOSCOPY WITH BIOPSY N/A 01/06/2022   Procedure: CYSTOSCOPY WITH BLADDER/URETHRAL BIOPSY;  Surgeon: Sebastian Ache, MD;  Location: [facility];  Service: Urology;  Laterality: N/A;  45 MINS   DILATION AND CURETTAGE OF UTERUS     LAPAROSCOPY     x 3   left knee surgery  1978   to remove bone tumor     NASAL SINUS SURGERY  Bilateral 01/13/2015   Procedure: ENDOSCOPIC SINUS SURGERY;  Surgeon: Serena Colonel, MD;  Location: [facility];  Service: ENT;  Laterality: Bilateral;   NASAL SINUS SURGERY  05/2017   NASAL TURBINATE REDUCTION     pubo vaginal sling  10/2003   TONSILLECTOMY     vaginal cystocele repair     anterior   VAGINAL HYSTERECTOMY  1995   AUB & Endometriosis   widom teeth removal     Patient Active Problem List   Diagnosis Date Noted   Vertigo 08/05/2021   Chronic prescription opiate use 12/14/2018   Carpal tunnel syndrome, bilateral 09/13/2017   Sacroiliac joint pain 09/13/2017   Urinary urgency 06/22/2017   Sciatica, right side 06/17/2016   Dysesthesia 10/30/2015   Right carpal tunnel syndrome 10/30/2015   Sciatica of left side 07/16/2015   Chronic ethmoidal sinusitis 06/26/2015   High risk medication use 07/08/2014   Chronic fatigue 04/02/2014   Dysphagia 04/02/2014   Ataxic gait 04/02/2014   Vitamin D deficiency 06/08/2012  OVERACTIVE BLADDER 08/01/2009   Osteoporosis 08/01/2009   Depression with anxiety 07/04/2007   MULTIPLE SCLEROSIS 07/04/2007   OA (osteoarthritis) of knee 07/04/2007   Headache 07/04/2007   History of colonic polyps 07/04/2007    PCP:  Thana Ates, MD       REFERRING PROVIDER:  Tama Gander, MD     REFERRING DIAG: M41.50 (ICD-10-CM) - Other secondary scoliosis, site unspecified   Rationale for Evaluation and Treatment: Rehabilitation  THERAPY DIAG:  Difficulty walking  Posture abnormality  Pain in joint of left knee  Muscle weakness (generalized)  ONSET DATE: 2023  SUBJECTIVE:                                                                                                                                                                                           SUBJECTIVE STATEMENT: Progress Note Reporting Period 09/03/22 to 02/16/2023   See note below for Objective Data and Assessment of Progress/Goals.      I feel  like he has shown me everything I can do on my own. Pulleys made between ribs sore. I am to the point where I can do the exercises and not hurt. I can stand without hurting and walk around the block.  Knee is 10+ years, torn meniscus that was never repaired. Pain stays a 6-7 all the time, regardless of activity/position.    Has TKA scheduled for 03/14/23  PERTINENT HISTORY:  2/23 fusion of T7-S1 for scoliosis; L knee surgery medial condyle excision for osteochondritis desiccans; L meniscus tear; MS relapsing remitting; history of vertigo  PAIN:  No pain in regards to spine.   PAIN:  Are you having pain? Yes: NPRS scale: 6-7/10 Pain location: Lt knee Pain description: catching, giving out Aggravating factors: constant pain, walking on unstable surfaces Relieving factors: nothing   PRECAUTIONS: None   WEIGHT BEARING RESTRICTIONS: No   FALLS:  Has patient fallen in last 6 months? No   LIVING ENVIRONMENT: Lives with: lives alone Lives in: House/apartment Stairs: No Has following equipment at home: [facility] and walker, does not use either    OCCUPATION: ICU nurse, retired    PLOF: Independent with basic ADLs  PATIENT GOALS: prolonging surgery   OBJECTIVE:   DIAGNOSTIC FINDINGS:  N/A recent  PATIENT SURVEYS:  FOTO 55 54 @ DC  FOTO 8/28  67%   FOTO 12/11 60%    POSTURE: rounded shoulders, forward head, and increased thoracic kyphosis  PALPATION: TTP of bilat thoracic parapsinals in T4-6 region, rhomboids, lats  LUMBAR ROM: thoracolumbar ROM limited as expected with T7-S1 fusion; no pain with AROM; no longer painful with SB  Difficulty getting into T/S extension in standing; able to once combined with scapular retraction  CERVICAL ROM:   Active ROM A/PROM (deg) eval 8/28 10/2   Flexion 80 80 80  Extension 60 60 60  Right lateral flexion 35 40 40  Left lateral flexion 35 40 40  Right rotation 75 75 75  Left rotation 75 75 75   (Blank rows = not tested)    MMT Right 6/28 R  8/28  Left 6/28 L 10/2   Shoulder flexion 4+/5 5/5 4+/5 5/5  Shoulder extension      Shoulder abduction 4+/5 5/5 4+/5 5/5  Shoulder adduction      Shoulder internal rotation 4+/5 5/5 4+/5 5/5  Shoulder external rotation 4/5 4+/5 4/5 4+/5  (Blank rows = not tested)   TODAY'S TREATMENT:      Treatment                            12/11:  HSS Quad set + SLR without touching table Sidelying hip circles Glut set+ UE slide up wall Walker fit & use, glut set at hip ext.    12/4   STM to L and R periscap mm/mid thoracic paraspinals-sidelying; STM bilat QL and lumbar paraspinals  UBE retro and fwd 3 min each Pulleys for lat stretch 2 mins Horizontal ABD YTB 2x10    10/30  STM to L and R periscap mm/mid thoracic paraspinals-sidelying; STM bilat QL and lumbar paraspinals  Straight arm shoulder ext 10lbs 3x10 Standing T vs prone/bent over T for home RTB shoulder ER      PATIENT EDUCATION:  Education details: anatomy, exercise progression, DOMS expectations, muscle firing,  envelope of function, HEP, POC  Person educated: Patient Education method: Explanation, Demonstration, Tactile cues, Verbal cues, and Handouts Education comprehension: verbalized understanding, returned demonstration, verbal cues required, and tactile cues required  HOME EXERCISE PROGRAM: Back:  [email or web address]  Knee: [id number]  ASSESSMENT:  CLINICAL IMPRESSION:  Pt has met all of her goals at this time and was re-evaluated for prehab for upcoming Lt TKA. Discussed biomechanical connection between back and knee and importance of continued care for both at this time. Limited core stability noted when added as a challenge to SLR. Will have pt preschedule post op appts and pt will obtain a new referral before that time.    OBJECTIVE IMPAIRMENTS decreased mobility, difficulty walking, decreased ROM, decreased strength, increased muscle spasms, improper body mechanics, postural  dysfunction, and pain.    ACTIVITY LIMITATIONS lifting, sitting, standing, squatting, stairs, transfers, locomotion level   PARTICIPATION LIMITATIONS: cleaning, laundry, driving, shopping, community activity, occupation, yard work, and exercise   PERSONAL FACTORS Age, Fitness, Time since onset of injury/illness/exacerbation, and 2+ comorbidity:    are also affecting patient's functional outcome.    REHAB POTENTIAL: Good   CLINICAL DECISION MAKING: unstable/complicated   EVALUATION COMPLEXITY: Moderate     GOALS:     SHORT TERM GOALS: Target date: 10/15/2022      Pt will become independent with HEP in order to demonstrate synthesis of PT education..   Goal status: MET 7/13   2.  Pt will score at least 1 pt increase on FOTO to demonstrate functional improvement in MCII and pt perceived function.      Goal status: MEt   3.  Pt will be able to demonstrate/report ability to sit/stand/sleep for extended periods of time without neck or shoulder discomfort in order to demonstrate functional improvement  and tolerance to static positioning.    Goal status: achieved     LONG TERM GOALS: Target date: 03/02/2023     Pt  will become independent with final HEP in order to demonstrate synthesis of PT education.   Goal status: met for back   2.  Pt will score >/= 55 on FOTO to demonstrate improvement in perceived bilat knee function.    Goal status: MET   3.  Pt will be able to demonstrate ability to maintain upright posture and alignment during all exercise in order to demonstrate functional improvement in postural awareness    Goal status: MET  4. Pt will demo good form in HEP prior to surgery   Goal status: new, requires further progressions.           PLAN: PT FREQUENCY: 1-2x/week   PT DURATION: POC date   PLANNED INTERVENTIONS: Therapeutic exercises, Therapeutic activity, Neuromuscular re-education, Balance training, Gait training, Patient/Family education, Self  Care, and Joint mobilization   PLAN FOR NEXT SESSION: core stability with LE challenges, gross flexibility, glut activation    Jocelyn Nold C. Kao Conry PT, DPT 02/16/23 11:04 AM

## 2023-02-17 DIAGNOSIS — K080 Exfoliation of teeth due to systemic causes: Secondary | ICD-10-CM | POA: Diagnosis not present

## 2023-02-22 NOTE — H&P (Cosign Needed Addendum)
TOTAL KNEE ADMISSION H&P  Patient is being admitted for left total knee arthroplasty.  Subjective:  Chief Complaint: Left knee pain.  HPI: Sheri Burns, 67 y.o. female has a history of pain and functional disability in the left knee due to arthritis and has failed non-surgical conservative treatments for greater than 12 weeks to include NSAID's and/or analgesics, corticosteriod injections, and activity modification. Onset of symptoms was gradual, starting several years ago with gradually worsening course since that time. The patient noted no past surgery on the left knee.  Patient currently rates pain in the left knee at 8 out of 10 with activity. Patient has night pain, worsening of pain with activity and weight bearing, and pain that interferes with activities of daily living. Patient has evidence of  tricompartmental degenerative changes, bone-on-bone patellofemoral and lateral with large osteophytes, and calcification posterior to the knee  by imaging studies. There is no active infection.  Patient Active Problem List   Diagnosis Date Noted   Vertigo 08/05/2021   Chronic prescription opiate use 12/14/2018   Carpal tunnel syndrome, bilateral 09/13/2017   Sacroiliac joint pain 09/13/2017   Urinary urgency 06/22/2017   Sciatica, right side 06/17/2016   Dysesthesia 10/30/2015   Right carpal tunnel syndrome 10/30/2015   Sciatica of left side 07/16/2015   Chronic ethmoidal sinusitis 06/26/2015   High risk medication use 07/08/2014   Chronic fatigue 04/02/2014   Dysphagia 04/02/2014   Ataxic gait 04/02/2014   Vitamin D deficiency 06/08/2012   OVERACTIVE BLADDER 08/01/2009   Osteoporosis 08/01/2009   Depression with anxiety 07/04/2007   MULTIPLE SCLEROSIS 07/04/2007   OA (osteoarthritis) of knee 07/04/2007   Headache 07/04/2007   History of colonic polyps 07/04/2007    Past Medical History:  Diagnosis Date   Anxiety    Bulging lumbar disc 1980   Chronic pansinusitis     Colon polyp    Depression    GERD (gastroesophageal reflux disease)    Gynecological examination    sees Dr. Aram Beecham Romine    Headache(784.0)    Hypertension    Multiple sclerosis (HCC) since 1992   Dr. Despina Arias in [facility]   Multiple thyroid nodules 1995   per pt. resolved on its own follwed by endo then released   Neurogenic bladder disorder 12/2009   w/ [facility] dx. Dr. McDiarmid   Neuromuscular disorder [facility])    Multiple sclerosis   Neuropathy    Osteoarthritis    Osteoporosis    last DEXA 2018   Overactive bladder    sees Dr. Lorin Picket McDiarmid    Pancreatitis 1977   Pneumonia    Ulcerative proctitis (HCC)    Vision abnormalities     Past Surgical History:  Procedure Laterality Date   APPENDECTOMY     breast ruptured  left   ductal cyst   CARPAL TUNNEL RELEASE Right    CHOLECYSTECTOMY     COLONOSCOPY  01/10/09   repeat in 5 yrs Dr. Charlott Rakes   CYSTOSCOPY WITH BIOPSY N/A 01/06/2022   Procedure: CYSTOSCOPY WITH BLADDER/URETHRAL BIOPSY;  Surgeon: Sebastian Ache, MD;  Location: [facility];  Service: Urology;  Laterality: N/A;  45 MINS   DILATION AND CURETTAGE OF UTERUS     LAPAROSCOPY     x 3   left knee surgery  1978   to remove bone tumor     NASAL SINUS SURGERY Bilateral 01/13/2015   Procedure: ENDOSCOPIC SINUS SURGERY;  Surgeon: Serena Colonel, MD;  Location: [facility];  Service: ENT;  Laterality: Bilateral;   NASAL SINUS SURGERY  05/2017   NASAL TURBINATE REDUCTION     pubo vaginal sling  10/2003   TONSILLECTOMY     vaginal cystocele repair     anterior   VAGINAL HYSTERECTOMY  1995   AUB & Endometriosis   widom teeth removal      Prior to Admission medications   Medication Sig Start Date End Date Taking? Authorizing Provider  acetaminophen (TYLENOL) 500 MG tablet Take 500 mg by mouth 4 (four) times daily.    [provider]  amLODipine-valsartan (EXFORGE) 5-160 MG tablet Take 1 tablet by mouth daily. 12/07/19    [provider]  amphetamine-dextroamphetamine (ADDERALL) 20 MG tablet TAKE 1 TABLET BY MOUTH 2 TIMES DAILY 02/08/23   Sater, Pearletha Furl, MD  Calcium Carb-Cholecalciferol (CALCIUM 600 + D PO) Take 1 tablet by mouth daily.    [provider]  clonazePAM (KLONOPIN) 1 MG tablet TAKE 1 TABLET BY MOUTH 3 TIMES DAILY AS NEEDED 10/07/22   Sater, Pearletha Furl, MD  COVID-19 mRNA vaccine, Pfizer, (COMIRNATY) syringe Inject into the muscle. 02/09/23   Judyann Munson, MD  denosumab (PROLIA) 60 MG/ML SOSY injection Inject 60 mg into the skin every 6 (six) months. 07/14/17   Patton Salles, MD  DULoxetine (CYMBALTA) 60 MG capsule TAKE 1 CAPSULE BY MOUTH EVERY DAY 11/03/22   Lomax, Amy, NP  fluticasone (FLONASE ALLERGY RELIEF) 50 MCG/ACT nasal spray Place 2 sprays into both nostrils daily.    [provider]  gabapentin (NEURONTIN) 600 MG tablet Take 1 tablet (600 mg total) by mouth 4 (four) times daily. 02/14/23   Lomax, Amy, NP  leflunomide (ARAVA) 20 MG tablet TAKE 1 TABLET BY MOUTH EVERY DAY 09/14/22   Sater, Pearletha Furl, MD  Multiple Vitamin (MULTIVITAMIN) tablet Take 1 tablet by mouth daily.    [provider]  oxybutynin (DITROPAN-XL) 10 MG 24 hr tablet TAKE 1 TABLET BY MOUTH EVERY DAY 09/02/22   Sater, Pearletha Furl, MD  traMADol (ULTRAM) 50 MG tablet TAKE 1 TABLET BY MOUTH EVERY 6 HOURS AS NEEDED (8am, 12noon, AT 4PM, & 8pm) 01/05/23   Sater, Pearletha Furl, MD    Allergies  Allergen Reactions   Amoxicillin Hives and Itching   Codeine Nausea And Vomiting    vomiting   Demerol [Meperidine] Nausea And Vomiting   Ivp Dye [Iodinated Contrast Media] Hives   Penicillins Hives   Stadol [Butorphanol] Other (See Comments)    Per patient psychotic event   Sulfonamide Derivatives Hives   Clindamycin/Lincomycin Rash   Dog Epithelium (Canis Lupus Familiaris) Other (See Comments)    Sneezing, cough Cat/ sinus infection and congestion   Gadolinium Derivatives Hives    Social History    Socioeconomic History   Marital status: Widowed    Spouse name: Windy Fast   Number of children: 1   Years of education: 16   Highest education level: Not on file  Occupational History   Occupation: BS Nursing    Employer: UNEMPLOYED   Occupation: [employer]  Tobacco Use   Smoking status: Former    [place] packs/day: 0.00    Average packs/day: 1 pack/day for 5.0 years (5.0 ttl pk-yrs)    Types: Cigarettes    Start date: 03/08/1974    Quit date: 03/09/1979    Years since quitting: 43.9   Smokeless tobacco: Never  Vaping Use   Vaping status: Never Used  Substance and Sexual Activity  Alcohol use: No   Drug use: No   Sexual activity: Not Currently    Partners: Male    Birth control/protection: Surgical  Other Topics Concern   Not on file  Social History Narrative   Right Handed   2 8oz bottles of Coke   Social Drivers of Health   Financial Resource Strain: Not on file  Food Insecurity: Not on file  Transportation Needs: Not on file  Physical Activity: Not on file  Stress: Not on file  Social Connections: Not on file  Intimate Partner Violence: Not on file    Tobacco Use: Medium Risk (02/16/2023)   Patient History    Smoking Tobacco Use: Former    Smokeless Tobacco Use: Never    Passive Exposure: Not on file   Social History   Substance and Sexual Activity  Alcohol Use No    Family History  Problem Relation Age of Onset   Osteoporosis Mother        severe   Cancer Mother 74       colon /Breast also m. niece at 76's   Breast cancer Mother    Osteopenia Sister        X 2   Breast cancer Other     Review of Systems  Constitutional:  Negative for chills and fever.  HENT: Negative.    Eyes: Negative.   Respiratory:  Negative for cough and shortness of breath.   Cardiovascular:  Negative for chest pain and palpitations.  Gastrointestinal:  Negative for abdominal pain, constipation, diarrhea, nausea and vomiting.  Genitourinary:  Negative for  dysuria, frequency and urgency.  Musculoskeletal:  Positive for joint pain.  Skin:  Negative for rash.    Objective:  Physical Exam: Well nourished and well developed.  General: Alert and oriented x3, cooperative and pleasant, no acute distress.  Head: normocephalic, atraumatic, neck supple.  Eyes: EOMI.  Abdomen: non-tender to palpation and soft, normoactive bowel sounds. Musculoskeletal: - Left knee shows slight valgus. - Range of motion is approximately 7 to 120 degrees with moderate crepitus. - Tenderness is noted laterally greater than medially. - No instability is observed. Calves soft and nontender. Motor function intact in LE. Strength 5/5 LE bilaterally. Neuro: Distal pulses 2+. Sensation to light touch intact in LE.  Vital signs in last 24 hours: BP: ()/()  Arterial Line BP: ()/()   Imaging Review Plain radiographs demonstrate moderate degenerative joint disease of the left knee. The overall alignment is neutral. The bone quality appears to be adequate for age and reported activity level.  Assessment/Plan:  End stage arthritis, left knee   The patient history, physical examination, clinical judgment of the provider and imaging studies are consistent with end stage degenerative joint disease of the left knee and total knee arthroplasty is deemed medically necessary. The treatment options including medical management, injection therapy arthroscopy and arthroplasty were discussed at length. The risks and benefits of total knee arthroplasty were presented and reviewed. The risks due to aseptic loosening, infection, stiffness, patella tracking problems, thromboembolic complications and other imponderables were discussed. The patient acknowledged the explanation, agreed to proceed with the plan and consent was signed. Patient is being admitted for inpatient treatment for surgery, pain control, PT, OT, prophylactic antibiotics, VTE prophylaxis, progressive ambulation and ADLs and  discharge planning. The patient is planning to be discharged  home .  Patient's anticipated LOS is less than 2 midnights, meeting these requirements: - Lives within 1 hour of care - Has a competent adult  at home to recover with post-op - NO history of  - Chronic pain requiring opiods  - Diabetes  - Coronary Artery Disease  - Heart failure  - Heart attack  - Stroke  - DVT/VTE  - Cardiac arrhythmia  - Respiratory Failure/COPD  - Renal failure  - Anemia  - Advanced Liver disease  Therapy Plans: [facility] Disposition: Home with Daughter Planned DVT Prophylaxis: Xarelto 10 mg (intolerant to aspirin) DME Needed: None PCP: Jacklynn Lewis., MD (clearance received) Neurologist: Despina Arias, MD (clearance received) TXA: IV Allergies: NSAIDs, penicillins, sulfa drugs, clindamycin, codeine, demerol, contrast media Anesthesia Concerns: hx spinal fusion BMI: 21.3 Last HgbA1c: not diabetic  Pharmacy: [facility]  Other: -Patient voiced concerns over Baker's cyst with calcified loose body and if that would be able to be removed. She also wanted to know if her leg lengths would be even. -Has tolerated oxycodone.  - Patient was instructed on what medications to stop prior to surgery. - Follow-up visit in 2 weeks with Dr. Lequita Halt - Begin physical therapy following surgery - Pre-operative lab work as pre-surgical testing - Prescriptions will be provided in hospital at time of discharge  R. Arcola Jansky, PA-C Orthopedic Surgery [facility] [place]

## 2023-02-22 NOTE — Progress Notes (Unsigned)
No chief complaint on file.  HISTORY OF PRESENT ILLNESS:  02/22/23 ALL:  Sheri Burns is a 67 y.o. female here today for follow up for RRMS. She continues [place]. Labs have been stable with PCP. MRI brain stable 08/2022   She is doing well. No new or exacerbating symptoms. Back and leg pain significantly improved following T7-S1 fusion 04/2021. She continues gabapentin 600mg  four times daily and tramadol up to four times daily. She feels pain is well managed. She is tolerating medications well with no adverse effects.   She continues to have intermittent dizziness???. Stable.   She continues clonazepam three times daily for anxiety. Cymbalta helps with depression and dysesthesias. She is feeling good. She is taking Adderall 20mg  BID. She is sleeping well.   Oxybutynin 10mg  helps with urinary urgency. She is followed by urology. 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   HISTORY (copied from Dr Bonnita Hollow previous note)  Sheri Burns is a 68 year old woman with MS.      Update  08/18/2022: She feels her MS is stable.   She is on leflunomide and tolerates it well.        Gait is doing well and she walks 1 1/4 mile 5 days a week.   She has joined a gym and uses a Psychologist, educational.   No issues with strength.  She has mild foot numbness which is stable.  Vision is fine.    Bladder has some urinary urgency helped by oxybutynin-XL 10 mg.    She no longer has incontinence.       She only has had a couple spells of vertigo, none severe.    She feels fatigue is better.   She is sleeping poorly, she feels mstly due mostly to leg pain.     Mood is doing well in general.   Adderall has helped her MS related ADD and fatigue.   .  She has anxiety and takes clonazepam 1 mg 3 times daily.   She had T7 to S1 fusion (Dr. Selena Batten in [place]) to correct scoliosis/scoliosis and for protrusions.    She needed a second operaio due to CSF leak and infection.  She feels she is recovering and  pain is much better,   currently, the back pain and leg pain are greatly improved and she was able to stop the fentanyl.  She still takes tramadol as needed.   She had hematuria and saw urology.  She had outpatient surgery for a urethral mass.  No additional treatment was needed   MS History:   She was diagnosed with multiple sclerosis more than 20 years ago. Initially, she was treated with Betaseron then Copaxone but has not been on any disease modifying therapy for about 8 years. During this time, she has had occasional  Exacerbations with more fatigue and gait issues.  She also has noted mild cognitive dysfunction worsening over the past 5 or 6 years. Her MRI of the brain performed 08/31/2012 shows foci that are predominantly periventricular. They are consistent with the diagnosis of multiple sclerosis. When this MRI was compared to an MRI dated 10/12/2010, there was one additional small periventricular focus. There were no acute findings on either MRI.   In November 2015, she had an exacerbation with severe fatigue, worsened gait and dysphagia.    She started Aubagio 09/09/14.      IMAGING L-spine MRI (12/22/20 shows. Multiplevel DJD.   Changes comared to 2019 were:   .  New large disc extrusion at L1-2 resulting in severe right lateral recess stenosis and right L2 nerve root impingement. 2. Decreased size of L4-5 disc protrusion with slightly decreased left lateral recess stenosis.   MRI thoracic spine 12/22/2020 showed Thoracic scoliosis.  No evidence of fracture or focal bone lesion.    There was chronic disc degeneration at T2-3 with a shallow central disc protrusion that narrows the ventral subarachnoid space but does not compress the cord. Chronic endplate marrow changes without evidence of active edema.     Left foraminal narrowing at T9-10, T10-11 and T11-12 with some potential to cause left-sided neural compression, particularly at T10-11.      It does show significant degenerative  changes at several levels.  At L4-L5 there appears to be a disc herniation towards the left that could be compressing the left L5 nerve root.  At L3-L4, there are changes more to the right that could affect the right L4 nerve root.  There are also degenerative changes at L1-L2 and L2-L3 though there does not appear to be nerve root compression at those levels.   MRI brain 7/142021 shows multiple T2/FLAIR hyperintense foci in the hemispheres with a few punctate foci in the pons and cerebral hemispheres. The pattern is consistent with chronic demyelinating plaque associated with multiple sclerosis. None of the foci appears to be acute. Compared to the MRI dated 08/31/2016, there are no new lesions.   MRI 08/31/2016 showed multiple lesions in the hemispheres and pons but no new lesions compared to 10/31/2014.   MRI 10/31/2014 showed periventricular and deep white matter T2/flair hyperintense foci in a pattern and configuration consistent with multiple sclerosis. None of these foci enhanced after contrast administration.  There were no new lesions compared to 08/30/2012 MRI   OTHER  NCV/EMG 10/18/2017 1.    Moderately severe right median neuropathy at the wrist (carpal tunnel syndrome).   Chronic denervation changes were noted in the APB muscle. 2.    Borderline left median neuropathy at the wrist. 3.    There is no evidence of a significant cervical radiculopathy on the right.   REVIEW OF SYSTEMS: Out of a complete 14 system review of symptoms, the patient complains only of the following symptoms, chronic pain, numbness, tingling, anxiety  and all other reviewed systems are negative.   ALLERGIES: Allergies  Allergen Reactions   Amoxicillin Hives and Itching   Codeine Nausea And Vomiting    vomiting   Demerol [Meperidine] Nausea And Vomiting   Ivp Dye [Iodinated Contrast Media] Hives   Penicillins Hives   Stadol [Butorphanol] Other (See Comments)    Per patient psychotic event   Sulfonamide  Derivatives Hives   Clindamycin/Lincomycin Rash   Dog Epithelium (Canis Lupus Familiaris) Other (See Comments)    Sneezing, cough Cat/ sinus infection and congestion   Gadolinium Derivatives Hives     HOME MEDICATIONS: Outpatient Medications Prior to Visit  Medication Sig Dispense Refill   acetaminophen (TYLENOL) 500 MG tablet Take 500 mg by mouth 4 (four) times daily.     amLODipine-valsartan (EXFORGE) 5-160 MG tablet Take 1 tablet by mouth daily.     amphetamine-dextroamphetamine (ADDERALL) 20 MG tablet TAKE 1 TABLET BY MOUTH 2 TIMES DAILY 60 tablet 0   Calcium Carb-Cholecalciferol (CALCIUM 600 + D PO) Take 1 tablet by mouth daily.     clonazePAM (KLONOPIN) 1 MG tablet TAKE 1 TABLET BY MOUTH 3 TIMES DAILY AS NEEDED 90 tablet 5   COVID-19 mRNA vaccine, Pfizer, (COMIRNATY) syringe  Inject into the muscle. 0.3 mL 0   denosumab (PROLIA) 60 MG/ML SOSY injection Inject 60 mg into the skin every 6 (six) months. 1 each 1   DULoxetine (CYMBALTA) 60 MG capsule TAKE 1 CAPSULE BY MOUTH EVERY DAY 90 capsule 1   fluticasone (FLONASE ALLERGY RELIEF) 50 MCG/ACT nasal spray Place 2 sprays into both nostrils daily.     gabapentin (NEURONTIN) 600 MG tablet Take 1 tablet (600 mg total) by mouth 4 (four) times daily. 360 tablet 0   leflunomide (ARAVA) 20 MG tablet TAKE 1 TABLET BY MOUTH EVERY DAY 90 tablet 1   Multiple Vitamin (MULTIVITAMIN) tablet Take 1 tablet by mouth daily.     oxybutynin (DITROPAN-XL) 10 MG 24 hr tablet TAKE 1 TABLET BY MOUTH EVERY DAY 90 tablet 3   traMADol (ULTRAM) 50 MG tablet TAKE 1 TABLET BY MOUTH EVERY 6 HOURS AS NEEDED (8am, 12noon, AT 4PM, & 8pm) 120 tablet 5   No facility-administered medications prior to visit.     PAST MEDICAL HISTORY: Past Medical History:  Diagnosis Date   Anxiety    Bulging lumbar disc 1980   Chronic pansinusitis    Colon polyp    Depression    GERD (gastroesophageal reflux disease)    Gynecological examination    sees Dr. Aram Beecham Romine     ZOXWRUEA(540.9)    Hypertension    Multiple sclerosis (HCC) since 1992   Dr. Despina Arias in [facility]   Multiple thyroid nodules 1995   per pt. resolved on its own follwed by endo then released   Neurogenic bladder disorder 12/2009   w/ [facility] dx. Dr. McDiarmid   Neuromuscular disorder [facility])    Multiple sclerosis   Neuropathy    Osteoarthritis    Osteoporosis    last DEXA 2018   Overactive bladder    sees Dr. Lorin Picket McDiarmid    Pancreatitis 1977   Pneumonia    Ulcerative proctitis (HCC)    Vision abnormalities      PAST SURGICAL HISTORY: Past Surgical History:  Procedure Laterality Date   APPENDECTOMY     breast ruptured  left   ductal cyst   CARPAL TUNNEL RELEASE Right    CHOLECYSTECTOMY     COLONOSCOPY  01/10/09   repeat in 5 yrs Dr. Charlott Rakes   CYSTOSCOPY WITH BIOPSY N/A 01/06/2022   Procedure: CYSTOSCOPY WITH BLADDER/URETHRAL BIOPSY;  Surgeon: Sebastian Ache, MD;  Location: [facility];  Service: Urology;  Laterality: N/A;  45 MINS   DILATION AND CURETTAGE OF UTERUS     LAPAROSCOPY     x 3   left knee surgery  1978   to remove bone tumor     NASAL SINUS SURGERY Bilateral 01/13/2015   Procedure: ENDOSCOPIC SINUS SURGERY;  Surgeon: Serena Colonel, MD;  Location: [facility];  Service: ENT;  Laterality: Bilateral;   NASAL SINUS SURGERY  05/2017   NASAL TURBINATE REDUCTION     pubo vaginal sling  10/2003   TONSILLECTOMY     vaginal cystocele repair     anterior   VAGINAL HYSTERECTOMY  1995   AUB & Endometriosis   widom teeth removal       FAMILY HISTORY: Family History  Problem Relation Age of Onset   Osteoporosis Mother        severe   Cancer Mother 61       colon /Breast also m. niece at 42's   Breast cancer Mother    Osteopenia  Sister        X 2   Breast cancer Other      SOCIAL HISTORY: Social History   Socioeconomic History   Marital status: Widowed    Spouse name: Windy Fast   Number of children: 1   Years of  education: 16   Highest education level: Not on file  Occupational History   Occupation: BS Nursing    Employer: UNEMPLOYED   Occupation: [employer] consultant  Tobacco Use   Smoking status: Former    Dudding packs/day: 0.00    Average packs/day: 1 pack/day for 5.0 years (5.0 ttl pk-yrs)    Types: Cigarettes    Start date: 03/08/1974    Quit date: 03/09/1979    Years since quitting: 43.9   Smokeless tobacco: Never  Vaping Use   Vaping status: Never Used  Substance and Sexual Activity   Alcohol use: No   Drug use: No   Sexual activity: Not Currently    Partners: Male    Birth control/protection: Surgical  Other Topics Concern   Not on file  Social History Narrative   Right Handed   2 8oz bottles of Coke   Social Drivers of Health   Financial Resource Strain: Not on file  Food Insecurity: Not on file  Transportation Needs: Not on file  Physical Activity: Not on file  Stress: Not on file  Social Connections: Not on file  Intimate Partner Violence: Not on file      PHYSICAL EXAM  There were no vitals filed for this visit.   There is no height or weight on file to calculate BMI.   Generalized: Well developed, in no acute distress  Cardiology: normal rate and rhythm, no murmur auscultated  Respiratory: clear to auscultation bilaterally    Neurological examination  Mentation: Alert oriented to time, place, history taking. Follows all commands speech and language fluent Cranial nerve II-XII: Pupils were equal round reactive to light. Extraocular movements were full, visual field were full on confrontational test. Facial sensation and strength were normal. Uvula tongue midline. Head turning and shoulder shrug  were normal and symmetric. Motor: The motor testing reveals 5 over 5 strength of all 4 extremities. Good symmetric motor tone is noted throughout.  Sensory: Sensory testing is intact to soft touch on all 4 extremities. No evidence of extinction is noted.   Coordination: Cerebellar testing reveals good finger-nose-finger and heel-to-shin bilaterally.  Gait and station: Gait is normal.  Reflexes: Deep tendon reflexes are symmetric and normal bilaterally.     DIAGNOSTIC DATA (LABS, IMAGING, TESTING) - I reviewed patient records, labs, notes, testing and imaging myself where available.  Lab Results  Component Value Date   WBC 7.3 08/18/2022   HGB 12.5 08/18/2022   HCT 37.4 08/18/2022   MCV 95 08/18/2022   PLT 394 08/18/2022      Component Value Date/Time   NA 139 01/05/2022 0829   NA 143 09/03/2020 1502   K 3.8 01/05/2022 0829   CL 104 01/05/2022 0829   CO2 28 01/05/2022 0829   GLUCOSE 90 01/05/2022 0829   BUN 15 01/05/2022 0829   BUN 13 09/03/2020 1502   CREATININE 1.03 (H) 01/05/2022 0829   CALCIUM 9.7 01/05/2022 0829   PROT 7.4 08/18/2022 1211   ALBUMIN 4.3 08/18/2022 1211   AST 25 08/18/2022 1211   ALT 17 08/18/2022 1211   ALKPHOS 88 08/18/2022 1211   BILITOT 0.2 08/18/2022 1211   GFRNONAA 60 (L) 01/05/2022 0829   GFRAA 79  01/26/2018 1415   Lab Results  Component Value Date   CHOL 221 (H) 08/07/2009   HDL 38.00 (L) 08/07/2009   LDLCALC 135 (H) 07/04/2007   LDLDIRECT 144.6 08/07/2009   TRIG 159.0 (H) 08/07/2009   CHOLHDL 6 08/07/2009   No results found for: "HGBA1C" No results found for: "VITAMINB12" Lab Results  Component Value Date   TSH 2.04 08/07/2009        No data to display               No data to display           ASSESSMENT AND PLAN  67 y.o. year old female  has a past medical history of Anxiety, Bulging lumbar disc (1980), Chronic pansinusitis, Colon polyp, Depression, GERD (gastroesophageal reflux disease), Gynecological examination, Headache(784.0), Hypertension, Multiple sclerosis (HCC) (since 1992), Multiple thyroid nodules (1995), Neurogenic bladder disorder (12/2009), Neuromuscular disorder (HCC), Neuropathy, Osteoarthritis, Osteoporosis, Overactive bladder, Pancreatitis (1977),  Pneumonia, Ulcerative proctitis (HCC), and Vision abnormalities. here with   No diagnosis found.   Mrs Fedor is doing very well, today. She is tolerating medications with no adverse effects. We will continue Arava as prescribed. She will continue gabapentin, tramadol, clonazepam, Adderall and oxybutynin as prescribed. PDMP reviewed and shows appropriate refills. She was educated on the need to monitor closely for sedative and respiratory side effects. She will call to notify me of any concerns. She was encouraged to stay active. Adequate hydration and well balanced diet advised. She will follow up with Dr Epimenio Foot in 6 months, sooner if needed.    No orders of the defined types were placed in this encounter.    No orders of the defined types were placed in this encounter.   I spent 30 minutes of face-to-face and non-face-to-face time with patient.  This included previsit chart review, lab review, study review, order entry, electronic health record documentation, patient education.   Shawnie Dapper, MSN, FNP-C 02/22/2023, 3:27 PM  [facility] [address], [address] [place], [place] [address] [phone]

## 2023-02-22 NOTE — Patient Instructions (Signed)

## 2023-02-23 ENCOUNTER — Encounter: Payer: Self-pay | Admitting: Family Medicine

## 2023-02-23 ENCOUNTER — Telehealth: Payer: Self-pay | Admitting: *Deleted

## 2023-02-23 ENCOUNTER — Ambulatory Visit (HOSPITAL_BASED_OUTPATIENT_CLINIC_OR_DEPARTMENT_OTHER): Payer: Medicare Other | Admitting: Physical Therapy

## 2023-02-23 ENCOUNTER — Ambulatory Visit: Payer: Medicare Other | Admitting: Family Medicine

## 2023-02-23 ENCOUNTER — Encounter (HOSPITAL_BASED_OUTPATIENT_CLINIC_OR_DEPARTMENT_OTHER): Payer: Self-pay | Admitting: Physical Therapy

## 2023-02-23 VITALS — BP 128/71 | HR 92 | Ht 65.0 in | Wt 130.0 lb

## 2023-02-23 DIAGNOSIS — G35 Multiple sclerosis: Secondary | ICD-10-CM | POA: Diagnosis not present

## 2023-02-23 DIAGNOSIS — M25662 Stiffness of left knee, not elsewhere classified: Secondary | ICD-10-CM

## 2023-02-23 DIAGNOSIS — F418 Other specified anxiety disorders: Secondary | ICD-10-CM

## 2023-02-23 DIAGNOSIS — R5382 Chronic fatigue, unspecified: Secondary | ICD-10-CM

## 2023-02-23 DIAGNOSIS — Z79899 Other long term (current) drug therapy: Secondary | ICD-10-CM | POA: Diagnosis not present

## 2023-02-23 DIAGNOSIS — R208 Other disturbances of skin sensation: Secondary | ICD-10-CM | POA: Diagnosis not present

## 2023-02-23 DIAGNOSIS — M6281 Muscle weakness (generalized): Secondary | ICD-10-CM | POA: Diagnosis not present

## 2023-02-23 DIAGNOSIS — M25562 Pain in left knee: Secondary | ICD-10-CM | POA: Diagnosis not present

## 2023-02-23 DIAGNOSIS — R262 Difficulty in walking, not elsewhere classified: Secondary | ICD-10-CM

## 2023-02-23 DIAGNOSIS — R293 Abnormal posture: Secondary | ICD-10-CM

## 2023-02-23 DIAGNOSIS — R42 Dizziness and giddiness: Secondary | ICD-10-CM

## 2023-02-23 DIAGNOSIS — R3915 Urgency of urination: Secondary | ICD-10-CM

## 2023-02-23 MED ORDER — GABAPENTIN 600 MG PO TABS: 600.0000 mg | ORAL_TABLET | Freq: Four times a day (QID) | ORAL | 1 refills | Status: DC

## 2023-02-23 MED ORDER — DULOXETINE HCL 60 MG PO CPEP: ORAL_CAPSULE | ORAL | 1 refills | Status: DC

## 2023-02-23 NOTE — Telephone Encounter (Signed)
Amy asked for CBC with Diff and chemistry panel for PCP. I called Dr.Raju and left message for [facility] in medical records to call and fax to [phone]

## 2023-02-23 NOTE — Telephone Encounter (Signed)
Request faxed to [facility] @ [phone]

## 2023-02-23 NOTE — Therapy (Signed)
OUTPATIENT PHYSICAL THERAPY THORACOLUMBAR TREATMENT       Patient Name: Sheri Burns MRN: [id number] DOB:09/27/1955, 67 y.o., female Today's Date: 02/23/2023  END OF SESSION:  PT End of Session - 02/23/23 1106     Visit Number 21    Number of Visits 26    Date for PT Re-Evaluation 03/13/23    Authorization Type BCBS MCR    PT Start Time 1015    PT Stop Time 1054    PT Time Calculation (min) 39 min    Activity Tolerance Patient tolerated treatment well    Behavior During Therapy WFL for tasks assessed/performed                              Past Medical History:  Diagnosis Date   Anxiety    Bulging lumbar disc 1980   Chronic pansinusitis    Colon polyp    Depression    GERD (gastroesophageal reflux disease)    Gynecological examination    sees Dr. Aram Beecham Romine    Headache(784.0)    Hypertension    Multiple sclerosis (HCC) since 1992   Dr. Despina Arias in [facility]   Multiple thyroid nodules 1995   per pt. resolved on its own follwed by endo then released   Neurogenic bladder disorder 12/2009   w/ [facility] dx. Dr. McDiarmid   Neuromuscular disorder [facility])    Multiple sclerosis   Neuropathy    Osteoarthritis    Osteoporosis    last DEXA 2018   Overactive bladder    sees Dr. Lorin Picket McDiarmid    Pancreatitis 1977   Pneumonia    Ulcerative proctitis (HCC)    Vision abnormalities    Past Surgical History:  Procedure Laterality Date   APPENDECTOMY     breast ruptured  left   ductal cyst   CARPAL TUNNEL RELEASE Right    CHOLECYSTECTOMY     COLONOSCOPY  01/10/09   repeat in 5 yrs Dr. Charlott Rakes   CYSTOSCOPY WITH BIOPSY N/A 01/06/2022   Procedure: CYSTOSCOPY WITH BLADDER/URETHRAL BIOPSY;  Surgeon: Sebastian Ache, MD;  Location: [facility];  Service: Urology;  Laterality: N/A;  45 MINS   DILATION AND CURETTAGE OF UTERUS     LAPAROSCOPY     x 3   left knee surgery  1978   to remove bone tumor     NASAL SINUS  SURGERY Bilateral 01/13/2015   Procedure: ENDOSCOPIC SINUS SURGERY;  Surgeon: Serena Colonel, MD;  Location: [facility];  Service: ENT;  Laterality: Bilateral;   NASAL SINUS SURGERY  05/2017   NASAL TURBINATE REDUCTION     pubo vaginal sling  10/2003   TONSILLECTOMY     vaginal cystocele repair     anterior   VAGINAL HYSTERECTOMY  1995   AUB & Endometriosis   widom teeth removal     Patient Active Problem List   Diagnosis Date Noted   Vertigo 08/05/2021   Chronic prescription opiate use 12/14/2018   Carpal tunnel syndrome, bilateral 09/13/2017   Sacroiliac joint pain 09/13/2017   Urinary urgency 06/22/2017   Sciatica, right side 06/17/2016   Dysesthesia 10/30/2015   Right carpal tunnel syndrome 10/30/2015   Sciatica of left side 07/16/2015   Chronic ethmoidal sinusitis 06/26/2015   High risk medication use 07/08/2014   Chronic fatigue 04/02/2014   Dysphagia 04/02/2014   Ataxic gait 04/02/2014   Vitamin D deficiency 06/08/2012  OVERACTIVE BLADDER 08/01/2009   Osteoporosis 08/01/2009   Depression with anxiety 07/04/2007   MULTIPLE SCLEROSIS 07/04/2007   OA (osteoarthritis) of knee 07/04/2007   Headache 07/04/2007   History of colonic polyps 07/04/2007    PCP:  Thana Ates, MD       REFERRING PROVIDER:  Tama Gander, MD     REFERRING DIAG: M41.50 (ICD-10-CM) - Other secondary scoliosis, site unspecified   Rationale for Evaluation and Treatment: Rehabilitation  THERAPY DIAG:  Difficulty walking  Posture abnormality  Pain in joint of left knee  Muscle weakness (generalized)  Stiffness of left knee, not elsewhere classified  ONSET DATE: 2023  SUBJECTIVE:                                                                                                                                                                                           SUBJECTIVE STATEMENT: Progress Note Reporting Period 09/03/22 to 02/23/2023   See note below for  Objective Data and Assessment of Progress/Goals.    Pt states standing on the L LE for about 5 mins will start causing pain.    Has TKA scheduled for 03/14/23  PERTINENT HISTORY:  2/23 fusion of T7-S1 for scoliosis; L knee surgery medial condyle excision for osteochondritis desiccans; L meniscus tear; MS relapsing remitting; history of vertigo  PAIN:  No pain in regards to spine.   PAIN:  Are you having pain? Yes: NPRS scale: 6/10 Pain location: Lt knee Pain description: catching, giving out Aggravating factors: constant pain, walking on unstable surfaces Relieving factors: nothing   PRECAUTIONS: None   WEIGHT BEARING RESTRICTIONS: No   FALLS:  Has patient fallen in last 6 months? No   LIVING ENVIRONMENT: Lives with: lives alone Lives in: House/apartment Stairs: No Has following equipment at home: [facility] and walker, does not use either    OCCUPATION: ICU nurse, retired    PLOF: Independent with basic ADLs  PATIENT GOALS: prolonging surgery   OBJECTIVE:   DIAGNOSTIC FINDINGS:  N/A recent  PATIENT SURVEYS:  FOTO 55 54 @ DC  FOTO 8/28  67%   FOTO 12/11 60%    POSTURE: rounded shoulders, forward head, and increased thoracic kyphosis  PALPATION: TTP of bilat thoracic parapsinals in T4-6 region, rhomboids, lats  LUMBAR ROM: thoracolumbar ROM limited as expected with T7-S1 fusion; no pain with AROM; no longer painful with SB   Difficulty getting into T/S extension in standing; able to once combined with scapular retraction  CERVICAL ROM:   Active ROM A/PROM (deg) eval 8/28 10/2   Flexion 80 80 80  Extension 60 60 60  Right lateral flexion  35 40 40  Left lateral flexion 35 40 40  Right rotation 75 75 75  Left rotation 75 75 75   (Blank rows = not tested)   MMT Right 6/28 R  8/28  Left 6/28 L 10/2   Shoulder flexion 4+/5 5/5 4+/5 5/5  Shoulder extension      Shoulder abduction 4+/5 5/5 4+/5 5/5  Shoulder adduction      Shoulder internal  rotation 4+/5 5/5 4+/5 5/5  Shoulder external rotation 4/5 4+/5 4/5 4+/5  (Blank rows = not tested)   TODAY'S TREATMENT:      12/18 Knee gapping with mobilization belt with PA and AP L tibiofemoral grade III glides L knee TKE mob grade IV  RTB TKE 2x10 GTB LAQ 2x10 S/L hip ABD 2x10  Post-op expectation, anatomy, precautions and focus with rehab post op, positioning at home to gain TKE   Treatment                            12/11:  HSS Quad set + SLR without touching table Sidelying hip circles Glut set+ UE slide up wall Walker fit & use, glut set at hip ext.    12/4   STM to L and R periscap mm/mid thoracic paraspinals-sidelying; STM bilat QL and lumbar paraspinals  UBE retro and fwd 3 min each Pulleys for lat stretch 2 mins Horizontal ABD YTB 2x10    10/30  STM to L and R periscap mm/mid thoracic paraspinals-sidelying; STM bilat QL and lumbar paraspinals  Straight arm shoulder ext 10lbs 3x10 Standing T vs prone/bent over T for home RTB shoulder ER      PATIENT EDUCATION:  Education details: anatomy, exercise progression, DOMS expectations, muscle firing,  envelope of function, HEP, POC  Person educated: Patient Education method: Explanation, Demonstration, Tactile cues, Verbal cues, and Handouts Education comprehension: verbalized understanding, returned demonstration, verbal cues required, and tactile cues required  HOME EXERCISE PROGRAM: Back:  [id number]  Knee: [id number]  ASSESSMENT:  CLINICAL IMPRESSION:  Pt session continued with L knee prehab with focus on knee extension, knee flexion, and quad strength. Pt without increase pain during exercise outside of baseline discomfort with L knee motion. Focus on gaining knee flexion, extension, and stronger quad firing at future sessions. Hip exercise also indicated as SLR ABD position is fatiguing and difficult to perform. Continue with knee prehab until TKA. Pt would benefit from continued skilled therapy  in order to reach goals and maximize functional L LE strength and ROM to improve post-surgical outcome.     OBJECTIVE IMPAIRMENTS decreased mobility, difficulty walking, decreased ROM, decreased strength, increased muscle spasms, improper body mechanics, postural dysfunction, and pain.    ACTIVITY LIMITATIONS lifting, sitting, standing, squatting, stairs, transfers, locomotion level   PARTICIPATION LIMITATIONS: cleaning, laundry, driving, shopping, community activity, occupation, yard work, and exercise   PERSONAL FACTORS Age, Fitness, Time since onset of injury/illness/exacerbation, and 2+ comorbidity:    are also affecting patient's functional outcome.    REHAB POTENTIAL: Good   CLINICAL DECISION MAKING: unstable/complicated   EVALUATION COMPLEXITY: Moderate     GOALS:     SHORT TERM GOALS: Target date: 10/15/2022      Pt will become independent with HEP in order to demonstrate synthesis of PT education..   Goal status: MET 7/13   2.  Pt will score at least 1 pt increase on FOTO to demonstrate functional improvement in MCII and pt perceived function.  Goal status: MEt   3.  Pt will be able to demonstrate/report ability to sit/stand/sleep for extended periods of time without neck or shoulder discomfort in order to demonstrate functional improvement and tolerance to static positioning.    Goal status: achieved     LONG TERM GOALS: Target date: 03/02/2023     Pt  will become independent with final HEP in order to demonstrate synthesis of PT education.   Goal status: met for back   2.  Pt will score >/= 55 on FOTO to demonstrate improvement in perceived bilat knee function.    Goal status: MET   3.  Pt will be able to demonstrate ability to maintain upright posture and alignment during all exercise in order to demonstrate functional improvement in postural awareness    Goal status: MET  4. Pt will demo good form in HEP prior to surgery   Goal status: new,  requires further progressions.           PLAN: PT FREQUENCY: 1-2x/week   PT DURATION: POC date   PLANNED INTERVENTIONS: Therapeutic exercises, Therapeutic activity, Neuromuscular re-education, Balance training, Gait training, Patient/Family education, Self Care, and Joint mobilization   PLAN FOR NEXT SESSION: core stability with LE challenges, gross flexibility, glut activation  Zebedee Iba PT, DPT 02/23/23 11:10 AM

## 2023-02-24 ENCOUNTER — Other Ambulatory Visit: Payer: Self-pay | Admitting: Neurology

## 2023-02-24 NOTE — Telephone Encounter (Signed)
Requested Prescriptions   Pending Prescriptions Disp Refills   clonazePAM (KLONOPIN) 1 MG tablet [Pharmacy Med Name: clonazepam 1 mg tablet] 90 tablet 5    Sig: TAKE 1 TABLET BY MOUTH 3 TIMES DAILY AS NEEDED   Last seen yesterday Next appt 09/14/23 Dispenses   Dispensed Days Supply Quantity Provider Pharmacy  clonazepam 1 mg tablet 01/30/2023 30 90 each Sater, Pearletha Furl, MD [facility]  clonazepam 1 mg tablet 01/04/2023 30 90 each Sater, Pearletha Furl, MD [facility]  clonazepam 1 mg tablet 12/09/2022 30 90 each Sater, Pearletha Furl, MD [facility]  clonazepam 1 mg tablet 11/10/2022 30 90 each Sater, Pearletha Furl, MD [facility]  clonazepam 1 mg tablet 10/13/2022 30 90 each Sater, Pearletha Furl, MD [facility]  clonazepam 1 mg tablet 09/10/2022 30 90 each Sater, Pearletha Furl, MD [facility]  clonazepam 1 mg tablet 08/11/2022 30 90 each Sater, Pearletha Furl, MD [facility]  clonazepam 1 mg tablet 07/13/2022 30 90 each Sater, Pearletha Furl, MD [facility]  clonazepam 1 mg tablet 06/15/2022 30 90 each Sater, Pearletha Furl, MD [facility]  clonazepam 1 mg tablet 05/14/2022 30 90 each Sater, Pearletha Furl, MD [facility]  clonazepam 1 mg tablet 04/15/2022 30 90 each Sater, Pearletha Furl, MD [facility]  clonazepam 1 mg tablet 03/18/2022 30 90 each Lomax, Amy, NP [facility]Marland KitchenMarland Kitchen

## 2023-02-26 ENCOUNTER — Encounter (HOSPITAL_BASED_OUTPATIENT_CLINIC_OR_DEPARTMENT_OTHER): Payer: Self-pay

## 2023-02-26 ENCOUNTER — Ambulatory Visit (HOSPITAL_BASED_OUTPATIENT_CLINIC_OR_DEPARTMENT_OTHER): Payer: Medicare Other

## 2023-02-26 DIAGNOSIS — M6281 Muscle weakness (generalized): Secondary | ICD-10-CM

## 2023-02-26 DIAGNOSIS — M25562 Pain in left knee: Secondary | ICD-10-CM

## 2023-02-26 DIAGNOSIS — R262 Difficulty in walking, not elsewhere classified: Secondary | ICD-10-CM | POA: Diagnosis not present

## 2023-02-26 DIAGNOSIS — R293 Abnormal posture: Secondary | ICD-10-CM

## 2023-02-26 DIAGNOSIS — M25662 Stiffness of left knee, not elsewhere classified: Secondary | ICD-10-CM

## 2023-02-26 NOTE — Therapy (Signed)
OUTPATIENT PHYSICAL THERAPY THORACOLUMBAR TREATMENT       Patient Name: Sheri Burns MRN: [id number] DOB:[birth year], 67 y.o., female Today's Date: 02/26/2023  END OF SESSION:  PT End of Session - 02/26/23 1020     Visit Number 22    Number of Visits 26    Date for PT Re-Evaluation 03/13/23    Authorization Type BCBS MCR    PT Start Time 1004    PT Stop Time 1046    PT Time Calculation (min) 42 min    Activity Tolerance Patient tolerated treatment well    Behavior During Therapy WFL for tasks assessed/performed                               Past Medical History:  Diagnosis Date   Anxiety    Bulging lumbar disc 1980   Chronic pansinusitis    Colon polyp    Depression    GERD (gastroesophageal reflux disease)    Gynecological examination    sees Dr. Aram Beecham Romine    Headache(784.0)    Hypertension    Multiple sclerosis (HCC) since 1992   Dr. Despina Arias in [facility]   Multiple thyroid nodules 1995   per pt. resolved on its own follwed by endo then released   Neurogenic bladder disorder 12/2009   w/ [facility] dx. Dr. McDiarmid   Neuromuscular disorder [facility])    Multiple sclerosis   Neuropathy    Osteoarthritis    Osteoporosis    last DEXA 2018   Overactive bladder    sees Dr. Lorin Picket McDiarmid    Pancreatitis 1977   Pneumonia    Ulcerative proctitis (HCC)    Vision abnormalities    Past Surgical History:  Procedure Laterality Date   APPENDECTOMY     breast ruptured  left   ductal cyst   CARPAL TUNNEL RELEASE Right    CHOLECYSTECTOMY     COLONOSCOPY  01/10/09   repeat in 5 yrs Dr. Charlott Rakes   CYSTOSCOPY WITH BIOPSY N/A 01/06/2022   Procedure: CYSTOSCOPY WITH BLADDER/URETHRAL BIOPSY;  Surgeon: Sebastian Ache, MD;  Location: [facility];  Service: Urology;  Laterality: N/A;  45 MINS   DILATION AND CURETTAGE OF UTERUS     LAPAROSCOPY     x 3   left knee surgery  1978   to remove bone tumor     NASAL SINUS  SURGERY Bilateral 01/13/2015   Procedure: ENDOSCOPIC SINUS SURGERY;  Surgeon: Serena Colonel, MD;  Location: [facility];  Service: ENT;  Laterality: Bilateral;   NASAL SINUS SURGERY  05/2017   NASAL TURBINATE REDUCTION     pubo vaginal sling  10/2003   TONSILLECTOMY     vaginal cystocele repair     anterior   VAGINAL HYSTERECTOMY  1995   AUB & Endometriosis   widom teeth removal     Patient Active Problem List   Diagnosis Date Noted   Vertigo 08/05/2021   Chronic prescription opiate use 12/14/2018   Carpal tunnel syndrome, bilateral 09/13/2017   Sacroiliac joint pain 09/13/2017   Urinary urgency 06/22/2017   Sciatica, right side 06/17/2016   Dysesthesia 10/30/2015   Right carpal tunnel syndrome 10/30/2015   Sciatica of left side 07/16/2015   Chronic ethmoidal sinusitis 06/26/2015   High risk medication use 07/08/2014   Chronic fatigue 04/02/2014   Dysphagia 04/02/2014   Ataxic gait 04/02/2014   Vitamin D deficiency 06/08/2012  OVERACTIVE BLADDER 08/01/2009   Osteoporosis 08/01/2009   Depression with anxiety 07/04/2007   MULTIPLE SCLEROSIS 07/04/2007   OA (osteoarthritis) of knee 07/04/2007   Headache 07/04/2007   History of colonic polyps 07/04/2007    PCP:  Thana Ates, MD       REFERRING PROVIDER:  Tama Gander, MD     REFERRING DIAG: M41.50 (ICD-10-CM) - Other secondary scoliosis, site unspecified   Rationale for Evaluation and Treatment: Rehabilitation  THERAPY DIAG:  Difficulty walking  Posture abnormality  Muscle weakness (generalized)  Stiffness of left knee, not elsewhere classified  Pain in joint of left knee  ONSET DATE: 2023  SUBJECTIVE:                                                                                                                                                                                           SUBJECTIVE STATEMENT:  Pt reports 7/10 pain level in L knee at entry. Pt having TKA on 03/14/23.      PERTINENT HISTORY:  2/23 fusion of T7-S1 for scoliosis; L knee surgery medial condyle excision for osteochondritis desiccans; L meniscus tear; MS relapsing remitting; history of vertigo  PAIN:  No pain in regards to spine.   PAIN:  Are you having pain? Yes: NPRS scale: 7/10 Pain location: Lt knee Pain description: catching, giving out Aggravating factors: constant pain, walking on unstable surfaces Relieving factors: nothing   PRECAUTIONS: None   WEIGHT BEARING RESTRICTIONS: No   FALLS:  Has patient fallen in last 6 months? No   LIVING ENVIRONMENT: Lives with: lives alone Lives in: House/apartment Stairs: No Has following equipment at home: [facility] and walker, does not use either    OCCUPATION: ICU nurse, retired    PLOF: Independent with basic ADLs  PATIENT GOALS: prolonging surgery   OBJECTIVE:   DIAGNOSTIC FINDINGS:  N/A recent  PATIENT SURVEYS:  FOTO 55 54 @ DC  FOTO 8/28  67%   FOTO 12/11 60%    POSTURE: rounded shoulders, forward head, and increased thoracic kyphosis  PALPATION: TTP of bilat thoracic parapsinals in T4-6 region, rhomboids, lats  LUMBAR ROM: thoracolumbar ROM limited as expected with T7-S1 fusion; no pain with AROM; no longer painful with SB   Difficulty getting into T/S extension in standing; able to once combined with scapular retraction  CERVICAL ROM:   Active ROM A/PROM (deg) eval 8/28 10/2   Flexion 80 80 80  Extension 60 60 60  Right lateral flexion 35 40 40  Left lateral flexion 35 40 40  Right rotation 75 75 75  Left rotation 75 75 75   (Blank rows =  not tested)   MMT Right 6/28 R  8/28  Left 6/28 L 10/2   Shoulder flexion 4+/5 5/5 4+/5 5/5  Shoulder extension      Shoulder abduction 4+/5 5/5 4+/5 5/5  Shoulder adduction      Shoulder internal rotation 4+/5 5/5 4+/5 5/5  Shoulder external rotation 4/5 4+/5 4/5 4+/5  (Blank rows = not tested)   TODAY'S TREATMENT:      12/21  PROM L knee STM L  HS and quad Supine sLR 3x10 S/l abduction 3x10 LAQ 3x10  HSC RTB x30 Partial squats x10 Single HR 2x10L Standing hip extension 2x10ea Marching on airex 2x10 SLS on airex 30sec x3 Marching in hall -1/2 hall 1 lap Side stepping in hall 1/2 hall x2 laps     12/18 Knee gapping with mobilization belt with PA and AP L tibiofemoral grade III glides L knee TKE mob grade IV  RTB TKE 2x10 GTB LAQ 2x10 S/L hip ABD 2x10  Post-op expectation, anatomy, precautions and focus with rehab post op, positioning at home to gain TKE   Treatment                            12/11:  HSS Quad set + SLR without touching table Sidelying hip circles Glut set+ UE slide up wall Walker fit & use, glut set at hip ext.    12/4   STM to L and R periscap mm/mid thoracic paraspinals-sidelying; STM bilat QL and lumbar paraspinals  UBE retro and fwd 3 min each Pulleys for lat stretch 2 mins Horizontal ABD YTB 2x10    10/30  STM to L and R periscap mm/mid thoracic paraspinals-sidelying; STM bilat QL and lumbar paraspinals  Straight arm shoulder ext 10lbs 3x10 Standing T vs prone/bent over T for home RTB shoulder ER      PATIENT EDUCATION:  Education details: anatomy, exercise progression, DOMS expectations, muscle firing,  envelope of function, HEP, POC  Person educated: Patient Education method: Explanation, Demonstration, Tactile cues, Verbal cues, and Handouts Education comprehension: verbalized understanding, returned demonstration, verbal cues required, and tactile cues required  HOME EXERCISE PROGRAM: Back:  [id number]  Knee: [id number]  ASSESSMENT:  CLINICAL IMPRESSION:  Continue with left LE strengthening interventions.  Keep patient to stay within pain limitations with exercises.  Good tolerance overall for all tasks, though she does describe uncomfortable crepitus in knee with weightbearing tasks.  Worked on stability and balance today with moderate deficits.  Patient will  continue to benefit from prehab interventions as she will be having TKA on 1/6.    OBJECTIVE IMPAIRMENTS decreased mobility, difficulty walking, decreased ROM, decreased strength, increased muscle spasms, improper body mechanics, postural dysfunction, and pain.    ACTIVITY LIMITATIONS lifting, sitting, standing, squatting, stairs, transfers, locomotion level   PARTICIPATION LIMITATIONS: cleaning, laundry, driving, shopping, community activity, occupation, yard work, and exercise   PERSONAL FACTORS Age, Fitness, Time since onset of injury/illness/exacerbation, and 2+ comorbidity:    are also affecting patient's functional outcome.    REHAB POTENTIAL: Good   CLINICAL DECISION MAKING: unstable/complicated   EVALUATION COMPLEXITY: Moderate     GOALS:     SHORT TERM GOALS: Target date: 10/15/2022      Pt will become independent with HEP in order to demonstrate synthesis of PT education..   Goal status: MET 7/13   2.  Pt will score at least 1 pt increase on FOTO to demonstrate functional improvement in MCII and  pt perceived function.      Goal status: MEt   3.  Pt will be able to demonstrate/report ability to sit/stand/sleep for extended periods of time without neck or shoulder discomfort in order to demonstrate functional improvement and tolerance to static positioning.    Goal status: achieved     LONG TERM GOALS: Target date: 03/02/2023     Pt  will become independent with final HEP in order to demonstrate synthesis of PT education.   Goal status: met for back   2.  Pt will score >/= 55 on FOTO to demonstrate improvement in perceived bilat knee function.    Goal status: MET   3.  Pt will be able to demonstrate ability to maintain upright posture and alignment during all exercise in order to demonstrate functional improvement in postural awareness    Goal status: MET  4. Pt will demo good form in HEP prior to surgery   Goal status: new, requires further  progressions.           PLAN: PT FREQUENCY: 1-2x/week   PT DURATION: POC date   PLANNED INTERVENTIONS: Therapeutic exercises, Therapeutic activity, Neuromuscular re-education, Balance training, Gait training, Patient/Family education, Self Care, and Joint mobilization   PLAN FOR NEXT SESSION: core stability with LE challenges, gross flexibility, glut activation Riki Altes, PTA  02/26/23 12:24 PM

## 2023-02-28 ENCOUNTER — Ambulatory Visit (HOSPITAL_BASED_OUTPATIENT_CLINIC_OR_DEPARTMENT_OTHER): Payer: Medicare Other | Admitting: Physical Therapy

## 2023-02-28 ENCOUNTER — Encounter (HOSPITAL_BASED_OUTPATIENT_CLINIC_OR_DEPARTMENT_OTHER): Payer: Self-pay | Admitting: Physical Therapy

## 2023-02-28 DIAGNOSIS — R293 Abnormal posture: Secondary | ICD-10-CM | POA: Diagnosis not present

## 2023-02-28 DIAGNOSIS — M25662 Stiffness of left knee, not elsewhere classified: Secondary | ICD-10-CM

## 2023-02-28 DIAGNOSIS — M6281 Muscle weakness (generalized): Secondary | ICD-10-CM | POA: Diagnosis not present

## 2023-02-28 DIAGNOSIS — R262 Difficulty in walking, not elsewhere classified: Secondary | ICD-10-CM

## 2023-02-28 DIAGNOSIS — M25562 Pain in left knee: Secondary | ICD-10-CM | POA: Diagnosis not present

## 2023-02-28 NOTE — Therapy (Signed)
OUTPATIENT PHYSICAL THERAPY THORACOLUMBAR TREATMENT       Patient Name: Sheri Burns MRN: [id number] DOB:02-22-1956, 67 y.o., female Today's Date: 02/28/2023  END OF SESSION:  PT End of Session - 02/28/23 1351     Visit Number 23    Number of Visits 26    Date for PT Re-Evaluation 03/13/23    Authorization Type BCBS MCR    PT Start Time 1400    PT Stop Time 1430    PT Time Calculation (min) 30 min    Activity Tolerance Patient tolerated treatment well    Behavior During Therapy WFL for tasks assessed/performed                                Past Medical History:  Diagnosis Date   Anxiety    Bulging lumbar disc 1980   Chronic pansinusitis    Colon polyp    Depression    GERD (gastroesophageal reflux disease)    Gynecological examination    sees Dr. Aram Beecham Romine    Headache(784.0)    Hypertension    Multiple sclerosis (HCC) since 1992   Dr. Despina Arias in [facility]   Multiple thyroid nodules 1995   per pt. resolved on its own follwed by endo then released   Neurogenic bladder disorder 12/2009   w/ urge & stress inc. dx. Dr. McDiarmid   Neuromuscular disorder [facility])    Multiple sclerosis   Neuropathy    Osteoarthritis    Osteoporosis    last DEXA 2018   Overactive bladder    sees Dr. Lorin Picket McDiarmid    Pancreatitis 1977   Pneumonia    Ulcerative proctitis (HCC)    Vision abnormalities    Past Surgical History:  Procedure Laterality Date   APPENDECTOMY     breast ruptured  left   ductal cyst   CARPAL TUNNEL RELEASE Right    CHOLECYSTECTOMY     COLONOSCOPY  01/10/09   repeat in 5 yrs Dr. Charlott Rakes   CYSTOSCOPY WITH BIOPSY N/A 01/06/2022   Procedure: CYSTOSCOPY WITH BLADDER/URETHRAL BIOPSY;  Surgeon: Sebastian Ache, MD;  Location: [facility];  Service: Urology;  Laterality: N/A;  45 MINS   DILATION AND CURETTAGE OF UTERUS     LAPAROSCOPY     x 3   left knee surgery  1978   to remove bone tumor     NASAL SINUS  SURGERY Bilateral 01/13/2015   Procedure: ENDOSCOPIC SINUS SURGERY;  Surgeon: Serena Colonel, MD;  Location: [facility];  Service: ENT;  Laterality: Bilateral;   NASAL SINUS SURGERY  05/2017   NASAL TURBINATE REDUCTION     pubo vaginal sling  10/2003   TONSILLECTOMY     vaginal cystocele repair     anterior   VAGINAL HYSTERECTOMY  1995   AUB & Endometriosis   widom teeth removal     Patient Active Problem List   Diagnosis Date Noted   Vertigo 08/05/2021   Chronic prescription opiate use 12/14/2018   Carpal tunnel syndrome, bilateral 09/13/2017   Sacroiliac joint pain 09/13/2017   Urinary urgency 06/22/2017   Sciatica, right side 06/17/2016   Dysesthesia 10/30/2015   Right carpal tunnel syndrome 10/30/2015   Sciatica of left side 07/16/2015   Chronic ethmoidal sinusitis 06/26/2015   High risk medication use 07/08/2014   Chronic fatigue 04/02/2014   Dysphagia 04/02/2014   Ataxic gait 04/02/2014   Vitamin D deficiency  06/08/2012   OVERACTIVE BLADDER 08/01/2009   Osteoporosis 08/01/2009   Depression with anxiety 07/04/2007   MULTIPLE SCLEROSIS 07/04/2007   OA (osteoarthritis) of knee 07/04/2007   Headache 07/04/2007   History of colonic polyps 07/04/2007    PCP:  Thana Ates, MD       REFERRING PROVIDER:  Tama Gander, MD     REFERRING DIAG: M41.50 (ICD-10-CM) - Other secondary scoliosis, site unspecified   Rationale for Evaluation and Treatment: Rehabilitation  THERAPY DIAG:  Difficulty walking  Posture abnormality  Muscle weakness (generalized)  Stiffness of left knee, not elsewhere classified  Pain in joint of left knee  ONSET DATE: 2023  SUBJECTIVE:                                                                                                                                                                                           SUBJECTIVE STATEMENT:  Pt reports 7/10 pain level in L knee at entry. Pt having TKA on 03/14/23.      PERTINENT HISTORY:  2/23 fusion of T7-S1 for scoliosis; L knee surgery medial condyle excision for osteochondritis desiccans; L meniscus tear; MS relapsing remitting; history of vertigo  PAIN:  No pain in regards to spine.   PAIN:  Are you having pain? Yes: NPRS scale: 7/10 Pain location: Lt knee Pain description: catching, giving out Aggravating factors: constant pain, walking on unstable surfaces Relieving factors: nothing   PRECAUTIONS: None   WEIGHT BEARING RESTRICTIONS: No   FALLS:  Has patient fallen in last 6 months? No   LIVING ENVIRONMENT: Lives with: lives alone Lives in: House/apartment Stairs: No Has following equipment at home: [facility] and walker, does not use either    OCCUPATION: ICU nurse, retired    PLOF: Independent with basic ADLs  PATIENT GOALS: prolonging surgery   OBJECTIVE:   DIAGNOSTIC FINDINGS:  N/A recent  PATIENT SURVEYS:  FOTO 55 54 @ DC  FOTO 8/28  67%   FOTO 12/11 60%    POSTURE: rounded shoulders, forward head, and increased thoracic kyphosis  PALPATION: TTP of bilat thoracic parapsinals in T4-6 region, rhomboids, lats  LUMBAR ROM: thoracolumbar ROM limited as expected with T7-S1 fusion; no pain with AROM; no longer painful with SB   Difficulty getting into T/S extension in standing; able to once combined with scapular retraction  CERVICAL ROM:   Active ROM A/PROM (deg) eval 8/28 10/2   Flexion 80 80 80  Extension 60 60 60  Right lateral flexion 35 40 40  Left lateral flexion 35 40 40  Right rotation 75 75 75  Left rotation 75 75 75   (  Blank rows = not tested)   MMT Right 6/28 R  8/28  Left 6/28 L 10/2   Shoulder flexion 4+/5 5/5 4+/5 5/5  Shoulder extension      Shoulder abduction 4+/5 5/5 4+/5 5/5  Shoulder adduction      Shoulder internal rotation 4+/5 5/5 4+/5 5/5  Shoulder external rotation 4/5 4+/5 4/5 4+/5  (Blank rows = not tested)   TODAY'S TREATMENT:      12/23  ROM: -7 TKE  improves to -2   Recumbent bike Lvl 4 min   Knee gapping with mobilization belt with PA and AP L tibiofemoral grade III glides L knee TKE mob grade IV; TKE mob with self quad set 2x10  2.5lbs SLR 3x8  2.5lbs LAQ 3x10 Knee flexion with self OP 10x 5s     12/21  PROM L knee STM L HS and quad Supine sLR 3x10 S/l abduction 3x10 LAQ 3x10  HSC RTB x30 Partial squats x10 Single HR 2x10L Standing hip extension 2x10ea Marching on airex 2x10 SLS on airex 30sec x3 Marching in hall -1/2 hall 1 lap Side stepping in hall 1/2 hall x2 laps     12/18 Knee gapping with mobilization belt with PA and AP L tibiofemoral grade III glides L knee TKE mob grade IV  RTB TKE 2x10 GTB LAQ 2x10 S/L hip ABD 2x10  Post-op expectation, anatomy, precautions and focus with rehab post op, positioning at home to gain TKE   Treatment                            12/11:  HSS Quad set + SLR without touching table Sidelying hip circles Glut set+ UE slide up wall Walker fit & use, glut set at hip ext.    12/4   STM to L and R periscap mm/mid thoracic paraspinals-sidelying; STM bilat QL and lumbar paraspinals  UBE retro and fwd 3 min each Pulleys for lat stretch 2 mins Horizontal ABD YTB 2x10    10/30  STM to L and R periscap mm/mid thoracic paraspinals-sidelying; STM bilat QL and lumbar paraspinals  Straight arm shoulder ext 10lbs 3x10 Standing T vs prone/bent over T for home RTB shoulder ER      PATIENT EDUCATION:  Education details: anatomy, exercise progression, DOMS expectations, muscle firing,  envelope of function, HEP, POC  Person educated: Patient Education method: Explanation, Demonstration, Tactile cues, Verbal cues, and Handouts Education comprehension: verbalized understanding, returned demonstration, verbal cues required, and tactile cues required  HOME EXERCISE PROGRAM: Back:  [id number]  Knee: [id number]  ASSESSMENT:  CLINICAL IMPRESSION:  Pt quad  strengthening progressed today and able to improve knee ext to -2 from -7. Pt fatigues quickly at the quadriceps with resistance exercise. Plan to continue with proprioceptive, strength, and ROM intervention for the knee at next.  Patient will continue to benefit from prehab interventions as she will be having TKA on 1/6.    OBJECTIVE IMPAIRMENTS decreased mobility, difficulty walking, decreased ROM, decreased strength, increased muscle spasms, improper body mechanics, postural dysfunction, and pain.    ACTIVITY LIMITATIONS lifting, sitting, standing, squatting, stairs, transfers, locomotion level   PARTICIPATION LIMITATIONS: cleaning, laundry, driving, shopping, community activity, occupation, yard work, and exercise   PERSONAL FACTORS Age, Fitness, Time since onset of injury/illness/exacerbation, and 2+ comorbidity:    are also affecting patient's functional outcome.    REHAB POTENTIAL: Good   CLINICAL DECISION MAKING: unstable/complicated   EVALUATION COMPLEXITY: Moderate  GOALS:     SHORT TERM GOALS: Target date: 10/15/2022      Pt will become independent with HEP in order to demonstrate synthesis of PT education..   Goal status: MET 7/13   2.  Pt will score at least 1 pt increase on FOTO to demonstrate functional improvement in MCII and pt perceived function.      Goal status: MEt   3.  Pt will be able to demonstrate/report ability to sit/stand/sleep for extended periods of time without neck or shoulder discomfort in order to demonstrate functional improvement and tolerance to static positioning.    Goal status: achieved     LONG TERM GOALS: Target date: 03/02/2023     Pt  will become independent with final HEP in order to demonstrate synthesis of PT education.   Goal status: met for back   2.  Pt will score >/= 55 on FOTO to demonstrate improvement in perceived bilat knee function.    Goal status: MET   3.  Pt will be able to demonstrate ability to maintain  upright posture and alignment during all exercise in order to demonstrate functional improvement in postural awareness    Goal status: MET  4. Pt will demo good form in HEP prior to surgery   Goal status: new, requires further progressions.           PLAN: PT FREQUENCY: 1-2x/week   PT DURATION: POC date   PLANNED INTERVENTIONS: Therapeutic exercises, Therapeutic activity, Neuromuscular re-education, Balance training, Gait training, Patient/Family education, Self Care, and Joint mobilization   PLAN FOR NEXT SESSION: core stability with LE challenges, gross flexibility, glut activation   Zebedee Iba PT, DPT 02/28/23 2:40 PM

## 2023-03-03 ENCOUNTER — Ambulatory Visit (HOSPITAL_BASED_OUTPATIENT_CLINIC_OR_DEPARTMENT_OTHER): Payer: Medicare Other | Admitting: Physical Therapy

## 2023-03-03 ENCOUNTER — Encounter (HOSPITAL_BASED_OUTPATIENT_CLINIC_OR_DEPARTMENT_OTHER): Payer: Self-pay | Admitting: Physical Therapy

## 2023-03-03 DIAGNOSIS — R293 Abnormal posture: Secondary | ICD-10-CM | POA: Diagnosis not present

## 2023-03-03 DIAGNOSIS — M6281 Muscle weakness (generalized): Secondary | ICD-10-CM

## 2023-03-03 DIAGNOSIS — M25662 Stiffness of left knee, not elsewhere classified: Secondary | ICD-10-CM

## 2023-03-03 DIAGNOSIS — R262 Difficulty in walking, not elsewhere classified: Secondary | ICD-10-CM | POA: Diagnosis not present

## 2023-03-03 DIAGNOSIS — M25562 Pain in left knee: Secondary | ICD-10-CM | POA: Diagnosis not present

## 2023-03-03 NOTE — Therapy (Signed)
OUTPATIENT PHYSICAL THERAPY THORACOLUMBAR TREATMENT       Patient Name: Sheri Burns MRN: [id number] DOB:05/29/1955, 67 y.o., female Today's Date: 03/03/2023  END OF SESSION:  PT End of Session - 03/03/23 1403     Visit Number 24    Number of Visits 26    Date for PT Re-Evaluation 03/13/23    Authorization Type BCBS MCR    PT Start Time 1400    PT Stop Time 1430    PT Time Calculation (min) 30 min    Activity Tolerance Patient tolerated treatment well    Behavior During Therapy WFL for tasks assessed/performed                                 Past Medical History:  Diagnosis Date   Anxiety    Bulging lumbar disc 1980   Chronic pansinusitis    Colon polyp    Depression    GERD (gastroesophageal reflux disease)    Gynecological examination    sees Dr. Aram Beecham Romine    Headache(784.0)    Hypertension    Multiple sclerosis (HCC) since 1992   Dr. Despina Arias in [facility]   Multiple thyroid nodules 1995   per pt. resolved on its own follwed by endo then released   Neurogenic bladder disorder 12/2009   w/ urge & stress inc. dx. Dr. McDiarmid   Neuromuscular disorder [facility])    Multiple sclerosis   Neuropathy    Osteoarthritis    Osteoporosis    last DEXA 2018   Overactive bladder    sees Dr. Lorin Picket McDiarmid    Pancreatitis 1977   Pneumonia    Ulcerative proctitis (HCC)    Vision abnormalities    Past Surgical History:  Procedure Laterality Date   APPENDECTOMY     breast ruptured  left   ductal cyst   CARPAL TUNNEL RELEASE Right    CHOLECYSTECTOMY     COLONOSCOPY  01/10/09   repeat in 5 yrs Dr. Charlott Rakes   CYSTOSCOPY WITH BIOPSY N/A 01/06/2022   Procedure: CYSTOSCOPY WITH BLADDER/URETHRAL BIOPSY;  Surgeon: Sebastian Ache, MD;  Location: [facility];  Service: Urology;  Laterality: N/A;  45 MINS   DILATION AND CURETTAGE OF UTERUS     LAPAROSCOPY     x 3   left knee surgery  1978   to remove bone tumor     NASAL SINUS  SURGERY Bilateral 01/13/2015   Procedure: ENDOSCOPIC SINUS SURGERY;  Surgeon: Serena Colonel, MD;  Location: [facility];  Service: ENT;  Laterality: Bilateral;   NASAL SINUS SURGERY  05/2017   NASAL TURBINATE REDUCTION     pubo vaginal sling  10/2003   TONSILLECTOMY     vaginal cystocele repair     anterior   VAGINAL HYSTERECTOMY  1995   AUB & Endometriosis   widom teeth removal     Patient Active Problem List   Diagnosis Date Noted   Vertigo 08/05/2021   Chronic prescription opiate use 12/14/2018   Carpal tunnel syndrome, bilateral 09/13/2017   Sacroiliac joint pain 09/13/2017   Urinary urgency 06/22/2017   Sciatica, right side 06/17/2016   Dysesthesia 10/30/2015   Right carpal tunnel syndrome 10/30/2015   Sciatica of left side 07/16/2015   Chronic ethmoidal sinusitis 06/26/2015   High risk medication use 07/08/2014   Chronic fatigue 04/02/2014   Dysphagia 04/02/2014   Ataxic gait 04/02/2014   Vitamin D  deficiency 06/08/2012   OVERACTIVE BLADDER 08/01/2009   Osteoporosis 08/01/2009   Depression with anxiety 07/04/2007   MULTIPLE SCLEROSIS 07/04/2007   OA (osteoarthritis) of knee 07/04/2007   Headache 07/04/2007   History of colonic polyps 07/04/2007    PCP:  Thana Ates, MD       REFERRING PROVIDER:  Tama Gander, MD     REFERRING DIAG: M41.50 (ICD-10-CM) - Other secondary scoliosis, site unspecified   Rationale for Evaluation and Treatment: Rehabilitation  THERAPY DIAG:  Difficulty walking  Posture abnormality  Muscle weakness (generalized)  Stiffness of left knee, not elsewhere classified  ONSET DATE: 2023  SUBJECTIVE:                                                                                                                                                                                           SUBJECTIVE STATEMENT:  Pt states the pain increased with standing with the holidays.  Pt having TKA on 03/14/23.   PERTINENT  HISTORY:  2/23 fusion of T7-S1 for scoliosis; L knee surgery medial condyle excision for osteochondritis desiccans; L meniscus tear; MS relapsing remitting; history of vertigo  PAIN:  No pain in regards to spine.   PAIN:  Are you having pain? Yes: NPRS scale: 7/10 Pain location: Lt knee Pain description: catching, giving out Aggravating factors: constant pain, walking on unstable surfaces Relieving factors: nothing   PRECAUTIONS: None   WEIGHT BEARING RESTRICTIONS: No   FALLS:  Has patient fallen in last 6 months? No   LIVING ENVIRONMENT: Lives with: lives alone Lives in: House/apartment Stairs: No Has following equipment at home: [facility] and walker, does not use either    OCCUPATION: ICU nurse, retired    PLOF: Independent with basic ADLs  PATIENT GOALS: prolonging surgery   OBJECTIVE:   DIAGNOSTIC FINDINGS:  N/A recent  PATIENT SURVEYS:  FOTO 55 54 @ DC  FOTO 8/28  67%   FOTO 12/11 60%    POSTURE: rounded shoulders, forward head, and increased thoracic kyphosis  PALPATION: TTP of bilat thoracic parapsinals in T4-6 region, rhomboids, lats  LUMBAR ROM: thoracolumbar ROM limited as expected with T7-S1 fusion; no pain with AROM; no longer painful with SB   Difficulty getting into T/S extension in standing; able to once combined with scapular retraction  CERVICAL ROM:   Active ROM A/PROM (deg) eval 8/28 10/2   Flexion 80 80 80  Extension 60 60 60  Right lateral flexion 35 40 40  Left lateral flexion 35 40 40  Right rotation 75 75 75  Left rotation 75 75 75   (Blank rows = not tested)  MMT Right 6/28 R  8/28  Left 6/28 L 10/2   Shoulder flexion 4+/5 5/5 4+/5 5/5  Shoulder extension      Shoulder abduction 4+/5 5/5 4+/5 5/5  Shoulder adduction      Shoulder internal rotation 4+/5 5/5 4+/5 5/5  Shoulder external rotation 4/5 4+/5 4/5 4+/5  (Blank rows = not tested)   TODAY'S TREATMENT:      12/26   Recumbent bike Lvl 5 min   PROM L  knee STM L gastroc in heel prop for knee ext 0 deg knee ext at end of session  12/23  ROM: -7 TKE improves to -2   Recumbent bike Lvl 4 min   Knee gapping with mobilization belt with PA and AP L tibiofemoral grade III glides L knee TKE mob grade IV; TKE mob with self quad set 2x10  2.5lbs SLR 3x8  2.5lbs LAQ 3x10 Knee flexion with self OP 10x 5s     12/21  PROM L knee STM L HS and quad Supine sLR 3x10 S/l abduction 3x10 LAQ 3x10  HSC RTB x30 Partial squats x10 Single HR 2x10L Standing hip extension 2x10ea Marching on airex 2x10 SLS on airex 30sec x3 Marching in hall -1/2 hall 1 lap Side stepping in hall 1/2 hall x2 laps     12/18 Knee gapping with mobilization belt with PA and AP L tibiofemoral grade III glides L knee TKE mob grade IV  RTB TKE 2x10 GTB LAQ 2x10 S/L hip ABD 2x10  Post-op expectation, anatomy, precautions and focus with rehab post op, positioning at home to gain TKE   Treatment                            12/11:  HSS Quad set + SLR without touching table Sidelying hip circles Glut set+ UE slide up wall Walker fit & use, glut set at hip ext.    12/4   STM to L and R periscap mm/mid thoracic paraspinals-sidelying; STM bilat QL and lumbar paraspinals  UBE retro and fwd 3 min each Pulleys for lat stretch 2 mins Horizontal ABD YTB 2x10    10/30  STM to L and R periscap mm/mid thoracic paraspinals-sidelying; STM bilat QL and lumbar paraspinals  Straight arm shoulder ext 10lbs 3x10 Standing T vs prone/bent over T for home RTB shoulder ER      PATIENT EDUCATION:  Education details: anatomy, exercise progression, DOMS expectations, muscle firing,  envelope of function, HEP, POC  Person educated: Patient Education method: Explanation, Demonstration, Tactile cues, Verbal cues, and Handouts Education comprehension: verbalized understanding, returned demonstration, verbal cues required, and tactile cues required  HOME EXERCISE  PROGRAM: Back:  [id number]  Knee: [id number]  ASSESSMENT:  CLINICAL IMPRESSION:  Pt with increase in pain from increase in standing from holiday season. Pt able to improve to 0 deg of ext  by end of session. HEP updated to include gastroc stretch which was limiting TKE. Plan to continue with proprioceptive, strength, and ROM intervention for the knee at next.  Patient will continue to benefit from prehab interventions as she will be having TKA on 1/6.    OBJECTIVE IMPAIRMENTS decreased mobility, difficulty walking, decreased ROM, decreased strength, increased muscle spasms, improper body mechanics, postural dysfunction, and pain.    ACTIVITY LIMITATIONS lifting, sitting, standing, squatting, stairs, transfers, locomotion level   PARTICIPATION LIMITATIONS: cleaning, laundry, driving, shopping, community activity, occupation, yard work, and exercise   PERSONAL FACTORS Age,  Fitness, Time since onset of injury/illness/exacerbation, and 2+ comorbidity:    are also affecting patient's functional outcome.    REHAB POTENTIAL: Good   CLINICAL DECISION MAKING: unstable/complicated   EVALUATION COMPLEXITY: Moderate     GOALS:     SHORT TERM GOALS: Target date: 10/15/2022      Pt will become independent with HEP in order to demonstrate synthesis of PT education..   Goal status: MET 7/13   2.  Pt will score at least 1 pt increase on FOTO to demonstrate functional improvement in MCII and pt perceived function.      Goal status: MEt   3.  Pt will be able to demonstrate/report ability to sit/stand/sleep for extended periods of time without neck or shoulder discomfort in order to demonstrate functional improvement and tolerance to static positioning.    Goal status: achieved     LONG TERM GOALS: Target date: 03/02/2023     Pt  will become independent with final HEP in order to demonstrate synthesis of PT education.   Goal status: met for back   2.  Pt will score >/= 55 on FOTO to  demonstrate improvement in perceived bilat knee function.    Goal status: MET   3.  Pt will be able to demonstrate ability to maintain upright posture and alignment during all exercise in order to demonstrate functional improvement in postural awareness    Goal status: MET  4. Pt will demo good form in HEP prior to surgery   Goal status: new, requires further progressions.           PLAN: PT FREQUENCY: 1-2x/week   PT DURATION: POC date   PLANNED INTERVENTIONS: Therapeutic exercises, Therapeutic activity, Neuromuscular re-education, Balance training, Gait training, Patient/Family education, Self Care, and Joint mobilization   PLAN FOR NEXT SESSION: core stability with LE challenges, gross flexibility, glut activation   Zebedee Iba PT, DPT 03/03/23 2:39 PM

## 2023-03-04 NOTE — Progress Notes (Addendum)
 Anesthesia Review:  ERE:Dwzyj Raju with Margarete  Cardiologist : none  Neuro- Richard Sater LOV with Amy Lomax,NP 02/23/23  Chest x-ray : EKG : 03/08/23  Echo : Stress test: Cardiac Cath :  Activity level: can do a flight of stairs without difficulty  Sleep Study/ CPAP : none  Fasting Blood Sugar :      / Checks Blood Sugar -- times a day:   Blood Thinner/ Instructions /Last Dose: ASA / Instructions/ Last Dose :    PT has MS- LOV Amy Lomax,NP 02/23/23 ( Neurology)  NO deficits with MS.

## 2023-03-04 NOTE — Patient Instructions (Signed)
SURGICAL WAITING ROOM VISITATION  Patients having surgery or a procedure may have no more than 2 support people in the waiting area - these visitors may rotate.    Children under the age of 27 must have an adult with them who is not the patient.  Due to an increase in RSV and influenza rates and associated hospitalizations, children ages 30 and under may not visit patients in [facility] hospitals.  If the patient needs to stay at the hospital during part of their recovery, the visitor guidelines for inpatient rooms apply. Pre-op nurse will coordinate an appropriate time for 1 support person to accompany patient in pre-op.  This support person may not rotate.    Please refer to the [facility] website for the visitor guidelines for Inpatients (after your surgery is over and you are in a regular room).       Your procedure is scheduled on:  03/14/2023    Report to [facility] Main Entrance    Report to admitting at   0800AM   Call this number if you have problems the morning of surgery [phone]   Do not eat food :After Midnight.   After Midnight you may have the following liquids until __ 0730____ AM DAY OF SURGERY  Water Non-Citrus Juices (without pulp, NO RED-Apple, White grape, White cranberry) Black Coffee (NO MILK/CREAM OR CREAMERS, sugar ok)  Clear Tea (NO MILK/CREAM OR CREAMERS, sugar ok) regular and decaf                             Plain Jell-O (NO RED)                                           Fruit ices (not with fruit pulp, NO RED)                                     Popsicles (NO RED)                                                               Sports drinks like Gatorade (NO RED)                   The day of surgery:  Drink ONE (1) Pre-Surgery Clear Ensure or G2 at  0730AM  ( have completed by ) the morning of surgery. Drink in one sitting. Do not sip.  This drink was given to you during your hospital  pre-op appointment visit. Nothing else to drink  after completing the  Pre-Surgery Clear Ensure or G2.          If you have questions, please contact your surgeon's office.       Oral Hygiene is also important to reduce your risk of infection.                                    Remember - BRUSH YOUR TEETH THE MORNING OF SURGERY WITH YOUR REGULAR  TOOTHPASTE  DENTURES WILL BE REMOVED PRIOR TO SURGERY PLEASE DO NOT APPLY "Poly grip" OR ADHESIVES!!!   Do NOT smoke after Midnight   Stop all vitamins and herbal supplements 7 days before surgery.   Take these medicines the morning of surgery with A SIP OF WATER:  cymbalta, gabapentin   DO NOT TAKE ANY ORAL DIABETIC MEDICATIONS DAY OF YOUR SURGERY  Bring CPAP mask and tubing day of surgery.                              You may not have any metal on your body including hair pins, jewelry, and body piercing             Do not wear make-up, lotions, powders, perfumes/cologne, or deodorant  Do not wear nail polish including gel and S&S, artificial/acrylic nails, or any other type of covering on natural nails including finger and toenails. If you have artificial nails, gel coating, etc. that needs to be removed by a nail salon please have this removed prior to surgery or surgery may need to be canceled/ delayed if the surgeon/ anesthesia feels like they are unable to be safely monitored.   Do not shave  48 hours prior to surgery.               Men may shave face and neck.   Do not bring valuables to the hospital. [facility] IS NOT             RESPONSIBLE   FOR VALUABLES.   Contacts, glasses, dentures or bridgework may not be worn into surgery.   Bring small overnight bag day of surgery.   DO NOT BRING YOUR HOME MEDICATIONS TO THE HOSPITAL. PHARMACY WILL DISPENSE MEDICATIONS LISTED ON YOUR MEDICATION LIST TO YOU DURING YOUR ADMISSION IN THE HOSPITAL!    Patients discharged on the day of surgery will not be allowed to drive home.  Someone NEEDS to stay with you for the first 24 hours  after anesthesia.   Special Instructions: Bring a copy of your healthcare power of attorney and living will documents the day of surgery if you haven't scanned them before.              Please read over the following fact sheets you were given: IF YOU HAVE QUESTIONS ABOUT YOUR PRE-OP INSTRUCTIONS PLEASE CALL [phone]   If you received a COVID test during your pre-op visit  it is requested that you wear a mask when out in public, stay away from anyone that may not be feeling well and notify your surgeon if you develop symptoms. If you test positive for Covid or have been in contact with anyone that has tested positive in the last 10 days please notify you surgeon.      Pre-operative 5 CHG Bath Instructions   You can play a key role in reducing the risk of infection after surgery. Your skin needs to be as free of germs as possible. You can reduce the number of germs on your skin by washing with CHG (chlorhexidine gluconate) soap before surgery. CHG is an antiseptic soap that kills germs and continues to kill germs even after washing.   DO NOT use if you have an allergy to chlorhexidine/CHG or antibacterial soaps. If your skin becomes reddened or irritated, stop using the CHG and notify one of our RNs at [phone].   Please shower with the CHG soap starting 4  days before surgery using the following schedule:     Please keep in mind the following:  DO NOT shave, including legs and underarms, starting the day of your first shower.   You may shave your face at any point before/day of surgery.  Place clean sheets on your bed the day you start using CHG soap. Use a clean washcloth (not used since being washed) for each shower. DO NOT sleep with pets once you start using the CHG.   CHG Shower Instructions:  If you choose to wash your hair and private area, wash first with your normal shampoo/soap.  After you use shampoo/soap, rinse your hair and body thoroughly to remove shampoo/soap  residue.  Turn the water OFF and apply about 3 tablespoons (45 ml) of CHG soap to a CLEAN washcloth.  Apply CHG soap ONLY FROM YOUR NECK DOWN TO YOUR TOES (washing for 3-5 minutes)  DO NOT use CHG soap on face, private areas, open wounds, or sores.  Pay special attention to the area where your surgery is being performed.  If you are having back surgery, having someone wash your back for you may be helpful. Wait 2 minutes after CHG soap is applied, then you may rinse off the CHG soap.  Pat dry with a clean towel  Put on clean clothes/pajamas   If you choose to wear lotion, please use ONLY the CHG-compatible lotions on the back of this paper.     Additional instructions for the day of surgery: DO NOT APPLY any lotions, deodorants, cologne, or perfumes.   Put on clean/comfortable clothes.  Brush your teeth.  Ask your nurse before applying any prescription medications to the skin.      CHG Compatible Lotions   Aveeno Moisturizing lotion  Cetaphil Moisturizing Cream  Cetaphil Moisturizing Lotion  Clairol Herbal Essence Moisturizing Lotion, Dry Skin  Clairol Herbal Essence Moisturizing Lotion, Extra Dry Skin  Clairol Herbal Essence Moisturizing Lotion, Normal Skin  Curel Age Defying Therapeutic Moisturizing Lotion with Alpha Hydroxy  Curel Extreme Care Body Lotion  Curel Soothing Hands Moisturizing Hand Lotion  Curel Therapeutic Moisturizing Cream, Fragrance-Free  Curel Therapeutic Moisturizing Lotion, Fragrance-Free  Curel Therapeutic Moisturizing Lotion, Original Formula  Eucerin Daily Replenishing Lotion  Eucerin Dry Skin Therapy Plus Alpha Hydroxy Crme  Eucerin Dry Skin Therapy Plus Alpha Hydroxy Lotion  Eucerin Original Crme  Eucerin Original Lotion  Eucerin Plus Crme Eucerin Plus Lotion  Eucerin TriLipid Replenishing Lotion  Keri Anti-Bacterial Hand Lotion  Keri Deep Conditioning Original Lotion Dry Skin Formula Softly Scented  Keri Deep Conditioning Original Lotion,  Fragrance Free Sensitive Skin Formula  Keri Lotion Fast Absorbing Fragrance Free Sensitive Skin Formula  Keri Lotion Fast Absorbing Softly Scented Dry Skin Formula  Keri Original Lotion  Keri Skin Renewal Lotion Keri Silky Smooth Lotion  Keri Silky Smooth Sensitive Skin Lotion  Nivea Body Creamy Conditioning Oil  Nivea Body Extra Enriched Teacher, adult education Moisturizing Lotion Nivea Crme  Nivea Skin Firming Lotion  NutraDerm 30 Skin Lotion  NutraDerm Skin Lotion  NutraDerm Therapeutic Skin Cream  NutraDerm Therapeutic Skin Lotion  ProShield Protective Hand Cream  Provon moisturizing lotion

## 2023-03-08 ENCOUNTER — Other Ambulatory Visit: Payer: Self-pay | Admitting: Neurology

## 2023-03-08 ENCOUNTER — Ambulatory Visit (HOSPITAL_BASED_OUTPATIENT_CLINIC_OR_DEPARTMENT_OTHER): Payer: Medicare Other

## 2023-03-08 ENCOUNTER — Encounter (HOSPITAL_COMMUNITY)
Admission: RE | Admit: 2023-03-08 | Discharge: 2023-03-08 | Disposition: A | Discharge status: Home / Self Care | Payer: Medicare Other | Source: Ambulatory Visit | Attending: Orthopedic Surgery | Admitting: Orthopedic Surgery

## 2023-03-08 ENCOUNTER — Encounter (HOSPITAL_BASED_OUTPATIENT_CLINIC_OR_DEPARTMENT_OTHER): Payer: Self-pay

## 2023-03-08 ENCOUNTER — Other Ambulatory Visit: Payer: Self-pay

## 2023-03-08 ENCOUNTER — Encounter (HOSPITAL_COMMUNITY): Payer: Self-pay

## 2023-03-08 VITALS — BP 108/70 | HR 98 | Temp 98.4°F | Resp 16 | Ht 65.0 in | Wt 120.0 lb

## 2023-03-08 DIAGNOSIS — G629 Polyneuropathy, unspecified: Secondary | ICD-10-CM | POA: Diagnosis not present

## 2023-03-08 DIAGNOSIS — M1712 Unilateral primary osteoarthritis, left knee: Secondary | ICD-10-CM | POA: Insufficient documentation

## 2023-03-08 DIAGNOSIS — M40209 Unspecified kyphosis, site unspecified: Secondary | ICD-10-CM | POA: Insufficient documentation

## 2023-03-08 DIAGNOSIS — R262 Difficulty in walking, not elsewhere classified: Secondary | ICD-10-CM | POA: Diagnosis not present

## 2023-03-08 DIAGNOSIS — G35 Multiple sclerosis: Secondary | ICD-10-CM | POA: Diagnosis not present

## 2023-03-08 DIAGNOSIS — R293 Abnormal posture: Secondary | ICD-10-CM

## 2023-03-08 DIAGNOSIS — I10 Essential (primary) hypertension: Secondary | ICD-10-CM | POA: Diagnosis not present

## 2023-03-08 DIAGNOSIS — Z87891 Personal history of nicotine dependence: Secondary | ICD-10-CM | POA: Diagnosis not present

## 2023-03-08 DIAGNOSIS — Z01818 Encounter for other preprocedural examination: Secondary | ICD-10-CM | POA: Diagnosis not present

## 2023-03-08 DIAGNOSIS — M6281 Muscle weakness (generalized): Secondary | ICD-10-CM

## 2023-03-08 DIAGNOSIS — M25562 Pain in left knee: Secondary | ICD-10-CM

## 2023-03-08 DIAGNOSIS — K219 Gastro-esophageal reflux disease without esophagitis: Secondary | ICD-10-CM | POA: Insufficient documentation

## 2023-03-08 DIAGNOSIS — M25662 Stiffness of left knee, not elsewhere classified: Secondary | ICD-10-CM

## 2023-03-08 HISTORY — DX: Anemia, unspecified: D64.9

## 2023-03-08 LAB — CBC
HCT: 37.9 % (ref 36.0–46.0)
Hemoglobin: 12.3 g/dL (ref 12.0–15.0)
MCH: 31.6 pg (ref 26.0–34.0)
MCHC: 32.5 g/dL (ref 30.0–36.0)
MCV: 97.4 fL (ref 80.0–100.0)
Platelets: 361 10*3/uL (ref 150–400)
RBC: 3.89 MIL/uL (ref 3.87–5.11)
RDW: 12.9 % (ref 11.5–15.5)
WBC: 7.4 10*3/uL (ref 4.0–10.5)
nRBC: 0 % (ref 0.0–0.2)

## 2023-03-08 LAB — BASIC METABOLIC PANEL
Anion gap: 8 (ref 5–15)
BUN: 16 mg/dL (ref 8–23)
CO2: 23 mmol/L (ref 22–32)
Calcium: 9.1 mg/dL (ref 8.9–10.3)
Chloride: 105 mmol/L (ref 98–111)
Creatinine, Ser: 0.91 mg/dL (ref 0.44–1.00)
GFR, Estimated: >60 mL/min (ref >60)
Glucose, Bld: 88 mg/dL (ref 70–99)
Potassium: 3.7 mmol/L (ref 3.5–5.1)
Sodium: 136 mmol/L (ref 135–145)

## 2023-03-08 LAB — SURGICAL PCR SCREEN
MRSA, PCR: NEGATIVE
Staphylococcus aureus: NEGATIVE

## 2023-03-08 NOTE — Telephone Encounter (Signed)
Last seen on 02/23/23 Follow up scheduled on 09/14/22 Adderall last filled on 02/24/23 #60 tablets (30 day supply) Leflunomide 20 mg last filed on 02/24/23 Rx pending to be signed   Both are for future refills, the pharmacy will not refill now.

## 2023-03-08 NOTE — Therapy (Signed)
 OUTPATIENT PHYSICAL THERAPY THORACOLUMBAR TREATMENT       Patient Name: Sheri Burns MRN: [id number] DOB:June 20, 1955, 67 y.o., female Today's Date: 03/08/2023  END OF SESSION:  PT End of Session - 03/08/23 1501     Visit Number 25    Number of Visits 26    Date for PT Re-Evaluation 03/13/23    Authorization Type BCBS MCR    PT Start Time 1433    PT Stop Time 1515    PT Time Calculation (min) 42 min    Activity Tolerance Patient tolerated treatment well    Behavior During Therapy WFL for tasks assessed/performed                                  Past Medical History:  Diagnosis Date   Anemia    Bulging lumbar disc 1980   Chronic pansinusitis    Colon polyp    GERD (gastroesophageal reflux disease)    Gynecological examination    sees Dr. Montie Romine    Hypertension    Multiple sclerosis (HCC) since 1992   Dr. Charlie Crete in [facility]   Multiple thyroid nodules 1995   per pt. resolved on its own follwed by endo then released   Neurogenic bladder disorder 12/2009   w/ [facility] dx. Dr. McDiarmid   Neuromuscular disorder [facility])    Multiple sclerosis   Neuropathy    Osteoarthritis    Osteoporosis    last DEXA 2018   Overactive bladder    sees Dr. Glendia McDiarmid    Pancreatitis 1977   Ulcerative proctitis (HCC)    Vision abnormalities    Past Surgical History:  Procedure Laterality Date   APPENDECTOMY     breast ruptured  left   ductal cyst   CARPAL TUNNEL RELEASE Right    CHOLECYSTECTOMY     COLONOSCOPY  01/10/2009   repeat in 5 yrs Dr. Jerrell Sol   CYSTOSCOPY WITH BIOPSY N/A 01/06/2022   Procedure: CYSTOSCOPY WITH BLADDER/URETHRAL BIOPSY;  Surgeon: Alvaro Hummer, MD;  Location: [facility];  Service: Urology;  Laterality: N/A;  45 MINS   DILATION AND CURETTAGE OF UTERUS     LAPAROSCOPY     x 3   left knee surgery  1978   to remove bone tumor     NASAL SINUS SURGERY Bilateral 01/13/2015   Procedure:  ENDOSCOPIC SINUS SURGERY;  Surgeon: Ida Loader, MD;  Location: [facility];  Service: ENT;  Laterality: Bilateral;   NASAL SINUS SURGERY  05/2017   NASAL TURBINATE REDUCTION     pubo vaginal sling  10/2003   SPINAL FUSION     T7-S1 2023   TONSILLECTOMY     vaginal cystocele repair     anterior   VAGINAL HYSTERECTOMY  1995   AUB & Endometriosis   widom teeth removal     Patient Active Problem List   Diagnosis Date Noted   Vertigo 08/05/2021   Chronic prescription opiate use 12/14/2018   Carpal tunnel syndrome, bilateral 09/13/2017   Sacroiliac joint pain 09/13/2017   Urinary urgency 06/22/2017   Sciatica, right side 06/17/2016   Dysesthesia 10/30/2015   Right carpal tunnel syndrome 10/30/2015   Sciatica of left side 07/16/2015   Chronic ethmoidal sinusitis 06/26/2015   High risk medication use 07/08/2014   Chronic fatigue 04/02/2014   Dysphagia 04/02/2014   Ataxic gait 04/02/2014   Vitamin D  deficiency  06/08/2012   OVERACTIVE BLADDER 08/01/2009   Osteoporosis 08/01/2009   Depression with anxiety 07/04/2007   MULTIPLE SCLEROSIS 07/04/2007   OA (osteoarthritis) of knee 07/04/2007   Headache 07/04/2007   History of colonic polyps 07/04/2007    PCP:  Dwight Trula SQUIBB, MD       REFERRING PROVIDER:  Luke Deward POUR, MD     REFERRING DIAG: M41.50 (ICD-10-CM) - Other secondary scoliosis, site unspecified   Rationale for Evaluation and Treatment: Rehabilitation  THERAPY DIAG:  Difficulty walking  Posture abnormality  Muscle weakness (generalized)  Stiffness of left knee, not elsewhere classified  Pain in joint of left knee  ONSET DATE: 2023  SUBJECTIVE:                                                                                                                                                                                           SUBJECTIVE STATEMENT:  Pt reports no increase or decrease in her pain. Pt having TKA on 03/14/23.   PERTINENT  HISTORY:  2/23 fusion of T7-S1 for scoliosis; L knee surgery medial condyle excision for osteochondritis desiccans; L meniscus tear; MS relapsing remitting; history of vertigo  PAIN:  No pain in regards to spine.   PAIN:  Are you having pain? Yes: NPRS scale: 7/10 Pain location: Lt knee Pain description: catching, giving out Aggravating factors: constant pain, walking on unstable surfaces Relieving factors: nothing   PRECAUTIONS: None   WEIGHT BEARING RESTRICTIONS: No   FALLS:  Has patient fallen in last 6 months? No   LIVING ENVIRONMENT: Lives with: lives alone Lives in: House/apartment Stairs: No Has following equipment at home: [facility] and walker, does not use either    OCCUPATION: ICU nurse, retired    PLOF: Independent with basic ADLs  PATIENT GOALS: prolonging surgery   OBJECTIVE:   DIAGNOSTIC FINDINGS:  N/A recent  PATIENT SURVEYS:  FOTO 55 54 @ DC  FOTO 8/28  67%   FOTO 12/11 60%    POSTURE: rounded shoulders, forward head, and increased thoracic kyphosis  PALPATION: TTP of bilat thoracic parapsinals in T4-6 region, rhomboids, lats  LUMBAR ROM: thoracolumbar ROM limited as expected with T7-S1 fusion; no pain with AROM; no longer painful with SB   Difficulty getting into T/S extension in standing; able to once combined with scapular retraction  CERVICAL ROM:   Active ROM A/PROM (deg) eval 8/28 10/2   Flexion 80 80 80  Extension 60 60 60  Right lateral flexion 35 40 40  Left lateral flexion 35 40 40  Right rotation 75 75 75  Left rotation 75 75 75   (Blank rows =  not tested)   MMT Right 6/28 R  8/28  Left 6/28 L 10/2   Shoulder flexion 4+/5 5/5 4+/5 5/5  Shoulder extension      Shoulder abduction 4+/5 5/5 4+/5 5/5  Shoulder adduction      Shoulder internal rotation 4+/5 5/5 4+/5 5/5  Shoulder external rotation 4/5 4+/5 4/5 4+/5  (Blank rows = not tested)   TODAY'S TREATMENT:      12/31   Recumbent bike Lvl 3  5 min    PROM L knee STM L gastroc in heel prop for knee ext Heel prop with 2.5lbs SLR 3x8  2.5lbs LAQ 3x10 Partial squats x10 Single HR 2x10L Standing hip extension 2x10ea Marching on airex 2x10 SLS on airex 30sec x3 Tandem on airex Marching in hall -1/2 hall 1 lap   12/26   Recumbent bike Lvl 5 min   PROM L knee STM L gastroc in heel prop for knee ext 0 deg knee ext at end of session  12/23  ROM: -7 TKE improves to -2   Recumbent bike Lvl 4 min   Knee gapping with mobilization belt with PA and AP L tibiofemoral grade III glides L knee TKE mob grade IV; TKE mob with self quad set 2x10  2.5lbs SLR 3x8  2.5lbs LAQ 3x10 Knee flexion with self OP 10x 5s     12/21  PROM L knee STM L HS and quad Supine sLR 3x10 S/l abduction 3x10 LAQ 3x10  HSC RTB x30 Partial squats x10 Single HR 2x10L Standing hip extension 2x10ea Marching on airex 2x10 SLS on airex 30sec x3 Marching in hall -1/2 hall 1 lap Side stepping in hall 1/2 hall x2 laps    PATIENT EDUCATION:  Education details: anatomy, exercise progression, DOMS expectations, muscle firing,  envelope of function, HEP, POC  Person educated: Patient Education method: Explanation, Demonstration, Tactile cues, Verbal cues, and Handouts Education comprehension: verbalized understanding, returned demonstration, verbal cues required, and tactile cues required  HOME EXERCISE PROGRAM: Back:  [id number]  Knee: [id number]  ASSESSMENT:  CLINICAL IMPRESSION:  Pt will have TKA on 03/14/23. She tolerates gentle ROM and therex well without increased pain. Spent time working on stability and balance today. Next visit will be reassessed following TKA.     OBJECTIVE IMPAIRMENTS decreased mobility, difficulty walking, decreased ROM, decreased strength, increased muscle spasms, improper body mechanics, postural dysfunction, and pain.    ACTIVITY LIMITATIONS lifting, sitting, standing, squatting, stairs, transfers, locomotion  level   PARTICIPATION LIMITATIONS: cleaning, laundry, driving, shopping, community activity, occupation, yard work, and exercise   PERSONAL FACTORS Age, Fitness, Time since onset of injury/illness/exacerbation, and 2+ comorbidity:    are also affecting patient's functional outcome.    REHAB POTENTIAL: Good   CLINICAL DECISION MAKING: unstable/complicated   EVALUATION COMPLEXITY: Moderate     GOALS:     SHORT TERM GOALS: Target date: 10/15/2022      Pt will become independent with HEP in order to demonstrate synthesis of PT education..   Goal status: MET 7/13   2.  Pt will score at least 1 pt increase on FOTO to demonstrate functional improvement in MCII and pt perceived function.      Goal status: MEt   3.  Pt will be able to demonstrate/report ability to sit/stand/sleep for extended periods of time without neck or shoulder discomfort in order to demonstrate functional improvement and tolerance to static positioning.    Goal status: achieved     LONG TERM GOALS: Target date:  03/02/2023     Pt  will become independent with final HEP in order to demonstrate synthesis of PT education.   Goal status: met for back   2.  Pt will score >/= 55 on FOTO to demonstrate improvement in perceived bilat knee function.    Goal status: MET   3.  Pt will be able to demonstrate ability to maintain upright posture and alignment during all exercise in order to demonstrate functional improvement in postural awareness    Goal status: MET  4. Pt will demo good form in HEP prior to surgery   Goal status: new, requires further progressions.           PLAN: PT FREQUENCY: 1-2x/week   PT DURATION: POC date   PLANNED INTERVENTIONS: Therapeutic exercises, Therapeutic activity, Neuromuscular re-education, Balance training, Gait training, Patient/Family education, Self Care, and Joint mobilization   PLAN FOR NEXT SESSION: Re-evaluate following L TKA surgery 03/14/23   Asberry Rodes,  PTA  03/08/23 4:02 PM

## 2023-03-10 ENCOUNTER — Encounter (HOSPITAL_BASED_OUTPATIENT_CLINIC_OR_DEPARTMENT_OTHER): Payer: Medicare Other

## 2023-03-10 ENCOUNTER — Encounter (HOSPITAL_COMMUNITY): Payer: Self-pay

## 2023-03-10 NOTE — Anesthesia Preprocedure Evaluation (Addendum)
 Anesthesia Evaluation  Patient identified by MRN, date of birth, ID band Patient awake    Reviewed: Allergy & Precautions, NPO status , Patient's Chart, lab work & pertinent test results  History of Anesthesia Complications Negative for: history of anesthetic complications  Airway Mallampati: III  TM Distance: >3 FB Neck ROM: Full    Dental no notable dental hx. (+) Dental Advisory Given, Teeth Intact   Pulmonary former smoker   Pulmonary exam normal breath sounds clear to auscultation       Cardiovascular hypertension, Pt. on medications Normal cardiovascular exam Rhythm:Regular Rate:Normal     Neuro/Psych  Headaches PSYCHIATRIC DISORDERS Anxiety Depression     Neuromuscular disease (MS)    GI/Hepatic Neg liver ROS, PUD,GERD  Controlled,,  Endo/Other  negative endocrine ROS    Renal/GU negative Renal ROS Bladder dysfunction Female GU complaint     Musculoskeletal  (+) Arthritis ,    Abdominal   Peds  Hematology  (+) Blood dyscrasia, anemia   Anesthesia Other Findings   Reproductive/Obstetrics                              Anesthesia Physical Anesthesia Plan  ASA: 3  Anesthesia Plan: Spinal, Regional and MAC   Post-op Pain Management: Tylenol  PO (pre-op)* and Regional block*   Induction: Intravenous  PONV Risk Score and Plan: 3 and Treatment may vary due to age or medical condition, Ondansetron , Dexamethasone  and Propofol  infusion  Airway Management Planned: Natural Airway  Additional Equipment: None  Intra-op Plan:   Post-operative Plan: Extubation in OR  Informed Consent:   Plan Discussed with: CRNA and Anesthesiologist  Anesthesia Plan Comments: (See PAT note from 12/31 by MARLA Senna PA-C  DISCUSSION: Sheri Burns is a 68 yo female who presents to PAT prior to L TKA for L knee OA on 03/14/23 with Dr. Melodi. PMH of former smoking, HTN, MS, vertigo, GERD, anxiety,  neuropathy, hx of kyphosis and scoliosis s/p PLIF T7-S1 (04/2021)   Patient follows with Neurology for hx of MS. Last seen on 02/23/23. She takes Arava . Per [name], NP: She is doing well. No new or exacerbating symptoms. Back and leg pain significantly improved following T7-S1 fusion 04/2021. She continues gabapentin  600mg  four times daily and tramadol  up to four times daily. She feels pain is well managed. She is tolerating medications well with no adverse effects. Advised f/u in 6 months. Neurology clearance signed (in media tab from 01/19/23) )         Anesthesia Quick Evaluation

## 2023-03-10 NOTE — Progress Notes (Signed)
 DISCUSSION: Sheri Burns is a 68 yo female who presents to PAT prior to L TKA for L knee OA on 03/14/23 with Dr. Melodi. PMH of former smoking, HTN, MS, vertigo, GERD, anxiety, neuropathy, hx of kyphosis and scoliosis s/p PLIF T7-S1 (04/2021)  Patient follows with Neurology for hx of MS. Last seen on 02/23/23. She takes Arava . Per [name], NP: She is doing well. No new or exacerbating symptoms. Back and leg pain significantly improved following T7-S1 fusion 04/2021. She continues gabapentin  600mg  four times daily and tramadol  up to four times daily. She feels pain is well managed. She is tolerating medications well with no adverse effects. Advised f/u in 6 months. Neurology clearance signed (in media tab from 01/19/23)  VS: BP 108/70   Pulse 98   Temp 36.9 C (Oral)   Resp 16   Ht 5' 5 (1.651 m)   Wt 54.4 kg   LMP 07/06/1993   SpO2 98%   BMI 19.97 kg/m   PROVIDERS: Sheri Trula SQUIBB, MD Neurology: Sheri Crete, MD  LABS: Labs reviewed: Acceptable for surgery. (all labs ordered are listed, but only abnormal results are displayed)  Labs Reviewed  SURGICAL PCR SCREEN  CBC  BASIC METABOLIC PANEL     IMAGES:   EKG 03/08/23 Normal sinus rhythm, rate 87 Left axis deviation Left anterior fascicular block Consider Anterior infarct , age undetermined When compared with ECG of 05-Jan-2022 08:35, No significant change since last tracing  CV:  Past Medical History:  Diagnosis Date   Anemia    Bulging lumbar disc 1980   Chronic pansinusitis    Colon polyp    GERD (gastroesophageal reflux disease)    Gynecological examination    sees Dr. Montie Romine    Hypertension    Multiple sclerosis [facility]) since 1992   Dr. Charlie Burns in [facility]   Multiple thyroid nodules 1995   per pt. resolved on its own follwed by endo then released   Neurogenic bladder disorder 12/2009   w/ [facility] dx. Dr. McDiarmid   Neuromuscular disorder [facility])    Multiple sclerosis   Neuropathy     Osteoarthritis    Osteoporosis    last DEXA 2018   Overactive bladder    sees Dr. Glendia McDiarmid    Pancreatitis 1977   Ulcerative proctitis (HCC)    Vision abnormalities     Past Surgical History:  Procedure Laterality Date   APPENDECTOMY     breast ruptured  left   ductal cyst   CARPAL TUNNEL RELEASE Right    CHOLECYSTECTOMY     COLONOSCOPY  01/10/2009   repeat in 5 yrs Dr. Jerrell Sol   CYSTOSCOPY WITH BIOPSY N/A 01/06/2022   Procedure: CYSTOSCOPY WITH BLADDER/URETHRAL BIOPSY;  Surgeon: Alvaro Hummer, MD;  Location: [facility];  Service: Urology;  Laterality: N/A;  45 MINS   DILATION AND CURETTAGE OF UTERUS     LAPAROSCOPY     x 3   left knee surgery  1978   to remove bone tumor     NASAL SINUS SURGERY Bilateral 01/13/2015   Procedure: ENDOSCOPIC SINUS SURGERY;  Surgeon: Ida Loader, MD;  Location: [facility];  Service: ENT;  Laterality: Bilateral;   NASAL SINUS SURGERY  05/2017   NASAL TURBINATE REDUCTION     pubo vaginal sling  10/2003   SPINAL FUSION     T7-S1 2023   TONSILLECTOMY     vaginal cystocele repair     anterior  VAGINAL HYSTERECTOMY  1995   AUB & Endometriosis   widom teeth removal      MEDICATIONS:  acetaminophen  (TYLENOL ) 500 MG tablet   amLODipine -valsartan  (EXFORGE ) 5-160 MG tablet   amphetamine -dextroamphetamine  (ADDERALL) 20 MG tablet   Calcium Carb-Cholecalciferol (CALCIUM 600 + D PO)   clonazePAM  (KLONOPIN ) 1 MG tablet   COVID-19 mRNA vaccine, Pfizer, (COMIRNATY ) syringe   denosumab  (PROLIA ) 60 MG/ML SOSY injection   DULoxetine  (CYMBALTA ) 60 MG capsule   finasteride  (PROPECIA ) 1 MG tablet   fluticasone  (FLONASE  ALLERGY RELIEF) 50 MCG/ACT nasal spray   gabapentin  (NEURONTIN ) 600 MG tablet   leflunomide  (ARAVA ) 20 MG tablet   Multiple Vitamins-Minerals (MULTIVITAMIN WITH MINERALS) tablet   oxybutynin  (DITROPAN -XL) 10 MG 24 hr tablet   PROTONIX  20 MG tablet   traMADol  (ULTRAM ) 50 MG tablet   No Simkins  facility-administered medications for this encounter.   Burnard CHRISTELLA Odis DEVONNA [facility] Surgical Short Stay/Anesthesiology [facility] Phone [phone] 03/10/2023 11:13 AM

## 2023-03-14 ENCOUNTER — Encounter (HOSPITAL_COMMUNITY)
Admission: RE | Disposition: A | Discharge status: Home / Self Care | Payer: Self-pay | Source: Home / Self Care | Attending: Orthopedic Surgery

## 2023-03-14 ENCOUNTER — Encounter (HOSPITAL_COMMUNITY): Payer: Self-pay | Admitting: Orthopedic Surgery

## 2023-03-14 ENCOUNTER — Observation Stay (HOSPITAL_COMMUNITY)
Admission: RE | Admit: 2023-03-14 | Discharge: 2023-03-15 | Disposition: A | Discharge status: Home / Self Care | Payer: Medicare Other | Attending: Orthopedic Surgery | Admitting: Orthopedic Surgery

## 2023-03-14 ENCOUNTER — Ambulatory Visit (HOSPITAL_COMMUNITY): Payer: Medicare Other | Admitting: Medical

## 2023-03-14 ENCOUNTER — Other Ambulatory Visit: Payer: Self-pay

## 2023-03-14 ENCOUNTER — Ambulatory Visit (HOSPITAL_BASED_OUTPATIENT_CLINIC_OR_DEPARTMENT_OTHER): Payer: Medicare Other

## 2023-03-14 DIAGNOSIS — Z87891 Personal history of nicotine dependence: Secondary | ICD-10-CM | POA: Diagnosis not present

## 2023-03-14 DIAGNOSIS — Z79899 Other long term (current) drug therapy: Secondary | ICD-10-CM | POA: Insufficient documentation

## 2023-03-14 DIAGNOSIS — M1712 Unilateral primary osteoarthritis, left knee: Secondary | ICD-10-CM | POA: Diagnosis not present

## 2023-03-14 DIAGNOSIS — I10 Essential (primary) hypertension: Secondary | ICD-10-CM | POA: Insufficient documentation

## 2023-03-14 DIAGNOSIS — Z01818 Encounter for other preprocedural examination: Secondary | ICD-10-CM

## 2023-03-14 DIAGNOSIS — F418 Other specified anxiety disorders: Secondary | ICD-10-CM | POA: Diagnosis not present

## 2023-03-14 DIAGNOSIS — M179 Osteoarthritis of knee, unspecified: Principal | ICD-10-CM | POA: Diagnosis present

## 2023-03-14 HISTORY — PX: TOTAL KNEE ARTHROPLASTY: SHX125

## 2023-03-14 SURGERY — ARTHROPLASTY, KNEE, TOTAL
Anesthesia: Monitor Anesthesia Care | Site: Knee | Laterality: Left

## 2023-03-14 MED ORDER — PANTOPRAZOLE SODIUM 20 MG PO TBEC
20.0000 mg | DELAYED_RELEASE_TABLET | Freq: Every day | ORAL | Status: DC
  Administered 2023-03-14: 20 mg via ORAL
  Filled 2023-03-14: qty 1

## 2023-03-14 MED ORDER — FLUTICASONE PROPIONATE 50 MCG/ACT NA SUSP
2.0000 | Freq: Every day | NASAL | Status: DC
  Administered 2023-03-14: 2 via NASAL
  Filled 2023-03-14: qty 16

## 2023-03-14 MED ORDER — ORAL CARE MOUTH RINSE: 15.0000 mL | Freq: Once | OROMUCOSAL | Status: AC

## 2023-03-14 MED ORDER — DULOXETINE HCL 60 MG PO CPEP
60.0000 mg | ORAL_CAPSULE | Freq: Every day | ORAL | Status: DC
  Administered 2023-03-15: 60 mg via ORAL
  Filled 2023-03-14: qty 1

## 2023-03-14 MED ORDER — AMPHETAMINE-DEXTROAMPHETAMINE 10 MG PO TABS
20.0000 mg | ORAL_TABLET | Freq: Two times a day (BID) | ORAL | Status: DC
  Administered 2023-03-15: 20 mg via ORAL
  Filled 2023-03-14 (×2): qty 2

## 2023-03-14 MED ORDER — TRAMADOL HCL 50 MG PO TABS: 50.0000 mg | ORAL_TABLET | Freq: Four times a day (QID) | ORAL | Status: DC | PRN

## 2023-03-14 MED ORDER — TRAMADOL HCL 50 MG PO TABS: 50.0000 mg | ORAL_TABLET | ORAL | Status: DC

## 2023-03-14 MED ORDER — SODIUM CHLORIDE (PF) 0.9 % IJ SOLN
INTRAMUSCULAR | Status: AC
  Filled 2023-03-14: qty 10

## 2023-03-14 MED ORDER — BISACODYL 10 MG RE SUPP: 10.0000 mg | Freq: Every day | RECTAL | Status: DC | PRN

## 2023-03-14 MED ORDER — DOCUSATE SODIUM 100 MG PO CAPS
100.0000 mg | ORAL_CAPSULE | Freq: Two times a day (BID) | ORAL | Status: DC
  Filled 2023-03-14 (×2): qty 1

## 2023-03-14 MED ORDER — PROPOFOL 10 MG/ML IV BOLUS
INTRAVENOUS | Status: DC | PRN
  Administered 2023-03-14 (×2): 10 mg via INTRAVENOUS
  Administered 2023-03-14 (×2): 20 mg via INTRAVENOUS

## 2023-03-14 MED ORDER — FINASTERIDE 1 MG PO TABS: 1.0000 mg | ORAL_TABLET | Freq: Every day | ORAL | Status: DC

## 2023-03-14 MED ORDER — DROPERIDOL 2.5 MG/ML IJ SOLN: 0.6250 mg | Freq: Once | INTRAMUSCULAR | Status: DC | PRN

## 2023-03-14 MED ORDER — PHENYLEPHRINE 80 MCG/ML (10ML) SYRINGE FOR IV PUSH (FOR BLOOD PRESSURE SUPPORT)
PREFILLED_SYRINGE | INTRAVENOUS | Status: AC
  Filled 2023-03-14: qty 10

## 2023-03-14 MED ORDER — METOCLOPRAMIDE HCL 5 MG/ML IJ SOLN: 5.0000 mg | Freq: Three times a day (TID) | INTRAMUSCULAR | Status: DC | PRN

## 2023-03-14 MED ORDER — AMLODIPINE BESYLATE 5 MG PO TABS
5.0000 mg | ORAL_TABLET | Freq: Every day | ORAL | Status: DC
  Administered 2023-03-14: 5 mg via ORAL
  Filled 2023-03-14: qty 1

## 2023-03-14 MED ORDER — LACTATED RINGERS IV SOLN: INTRAVENOUS | Status: DC

## 2023-03-14 MED ORDER — OXYCODONE HCL 5 MG PO TABS
10.0000 mg | ORAL_TABLET | ORAL | Status: DC | PRN
  Administered 2023-03-14 – 2023-03-15 (×3): 15 mg via ORAL
  Filled 2023-03-14 (×3): qty 3

## 2023-03-14 MED ORDER — PROPOFOL 1000 MG/100ML IV EMUL
INTRAVENOUS | Status: AC
  Filled 2023-03-14: qty 100

## 2023-03-14 MED ORDER — IRBESARTAN 150 MG PO TABS
150.0000 mg | ORAL_TABLET | Freq: Every day | ORAL | Status: DC
  Administered 2023-03-14: 150 mg via ORAL
  Filled 2023-03-14: qty 1

## 2023-03-14 MED ORDER — CLONAZEPAM 1 MG PO TABS
1.0000 mg | ORAL_TABLET | Freq: Three times a day (TID) | ORAL | Status: DC
  Administered 2023-03-14 – 2023-03-15 (×3): 1 mg via ORAL
  Filled 2023-03-14 (×3): qty 1

## 2023-03-14 MED ORDER — CEFAZOLIN SODIUM-DEXTROSE 2-4 GM/100ML-% IV SOLN
2.0000 g | INTRAVENOUS | Status: AC
  Administered 2023-03-14: 2 g via INTRAVENOUS
  Filled 2023-03-14: qty 100

## 2023-03-14 MED ORDER — ONDANSETRON HCL 4 MG PO TABS: 4.0000 mg | ORAL_TABLET | Freq: Four times a day (QID) | ORAL | Status: DC | PRN

## 2023-03-14 MED ORDER — FENTANYL CITRATE (PF) 100 MCG/2ML IJ SOLN
INTRAMUSCULAR | Status: DC | PRN
  Administered 2023-03-14 (×2): 50 ug via INTRAVENOUS

## 2023-03-14 MED ORDER — DIPHENHYDRAMINE HCL 12.5 MG/5ML PO ELIX: 12.5000 mg | ORAL_SOLUTION | ORAL | Status: DC | PRN

## 2023-03-14 MED ORDER — ACETAMINOPHEN 10 MG/ML IV SOLN
1000.0000 mg | Freq: Once | INTRAVENOUS | Status: DC | PRN
Start: 2023-03-14 — End: 2023-03-14

## 2023-03-14 MED ORDER — TRAMADOL HCL 50 MG PO TABS
50.0000 mg | ORAL_TABLET | Freq: Four times a day (QID) | ORAL | Status: DC
Start: 2023-03-14 — End: 2023-03-15
  Administered 2023-03-14 – 2023-03-15 (×4): 50 mg via ORAL
  Filled 2023-03-14 (×4): qty 1

## 2023-03-14 MED ORDER — STERILE WATER FOR IRRIGATION IR SOLN
Status: DC | PRN
  Administered 2023-03-14: 1000 mL

## 2023-03-14 MED ORDER — DEXAMETHASONE SODIUM PHOSPHATE 10 MG/ML IJ SOLN
10.0000 mg | Freq: Once | INTRAMUSCULAR | Status: AC
  Administered 2023-03-15: 10 mg via INTRAVENOUS
  Filled 2023-03-14: qty 1

## 2023-03-14 MED ORDER — FENTANYL CITRATE PF 50 MCG/ML IJ SOSY
100.0000 ug | PREFILLED_SYRINGE | INTRAMUSCULAR | Status: AC
  Administered 2023-03-14: 50 ug via INTRAVENOUS
  Filled 2023-03-14: qty 2

## 2023-03-14 MED ORDER — SODIUM CHLORIDE (PF) 0.9 % IJ SOLN
INTRAMUSCULAR | Status: DC | PRN
  Administered 2023-03-14: 80 mL

## 2023-03-14 MED ORDER — CHLORHEXIDINE GLUCONATE 0.12 % MT SOLN
15.0000 mL | Freq: Once | OROMUCOSAL | Status: AC
Start: 2023-03-14 — End: 2023-03-14
  Administered 2023-03-14: 15 mL via OROMUCOSAL

## 2023-03-14 MED ORDER — RIVAROXABAN 10 MG PO TABS
10.0000 mg | ORAL_TABLET | Freq: Every day | ORAL | Status: DC
  Administered 2023-03-15: 10 mg via ORAL
  Filled 2023-03-14: qty 1

## 2023-03-14 MED ORDER — SODIUM CHLORIDE (PF) 0.9 % IJ SOLN
INTRAMUSCULAR | Status: AC
  Filled 2023-03-14: qty 50

## 2023-03-14 MED ORDER — BUPIVACAINE LIPOSOME 1.3 % IJ SUSP
INTRAMUSCULAR | Status: AC
  Filled 2023-03-14: qty 20

## 2023-03-14 MED ORDER — METOCLOPRAMIDE HCL 5 MG PO TABS: 5.0000 mg | ORAL_TABLET | Freq: Three times a day (TID) | ORAL | Status: DC | PRN

## 2023-03-14 MED ORDER — GABAPENTIN 300 MG PO CAPS
600.0000 mg | ORAL_CAPSULE | Freq: Four times a day (QID) | ORAL | Status: DC
  Administered 2023-03-14 – 2023-03-15 (×3): 600 mg via ORAL
  Filled 2023-03-14 (×4): qty 2

## 2023-03-14 MED ORDER — SODIUM CHLORIDE 0.9 % IR SOLN
Status: DC | PRN
  Administered 2023-03-14: 1000 mL

## 2023-03-14 MED ORDER — PROPOFOL 500 MG/50ML IV EMUL
INTRAVENOUS | Status: DC | PRN
  Administered 2023-03-14: 50 ug/kg/min via INTRAVENOUS

## 2023-03-14 MED ORDER — ONDANSETRON HCL 4 MG/2ML IJ SOLN
INTRAMUSCULAR | Status: AC
  Filled 2023-03-14: qty 2

## 2023-03-14 MED ORDER — DEXAMETHASONE SODIUM PHOSPHATE 10 MG/ML IJ SOLN
INTRAMUSCULAR | Status: AC
  Filled 2023-03-14: qty 1

## 2023-03-14 MED ORDER — FENTANYL CITRATE PF 50 MCG/ML IJ SOSY
PREFILLED_SYRINGE | INTRAMUSCULAR | Status: AC
  Administered 2023-03-14: 50 ug via INTRAVENOUS
  Filled 2023-03-14: qty 3

## 2023-03-14 MED ORDER — TRANEXAMIC ACID-NACL 1000-0.7 MG/100ML-% IV SOLN
1000.0000 mg | INTRAVENOUS | Status: AC
  Administered 2023-03-14: 1000 mg via INTRAVENOUS
  Filled 2023-03-14: qty 100

## 2023-03-14 MED ORDER — POVIDONE-IODINE 10 % EX SWAB: 2.0000 | Freq: Once | CUTANEOUS | Status: DC

## 2023-03-14 MED ORDER — FENTANYL CITRATE PF 50 MCG/ML IJ SOSY
25.0000 ug | PREFILLED_SYRINGE | INTRAMUSCULAR | Status: DC | PRN
  Administered 2023-03-14: 50 ug via INTRAVENOUS

## 2023-03-14 MED ORDER — ONDANSETRON HCL 4 MG/2ML IJ SOLN
INTRAMUSCULAR | Status: DC | PRN
  Administered 2023-03-14: 4 mg via INTRAVENOUS

## 2023-03-14 MED ORDER — DEXAMETHASONE SODIUM PHOSPHATE 10 MG/ML IJ SOLN
INTRAMUSCULAR | Status: DC | PRN
  Administered 2023-03-14: 6 mg via INTRAVENOUS

## 2023-03-14 MED ORDER — DEXAMETHASONE SODIUM PHOSPHATE 10 MG/ML IJ SOLN: 8.0000 mg | Freq: Once | INTRAMUSCULAR | Status: DC

## 2023-03-14 MED ORDER — METHOCARBAMOL 1000 MG/10ML IJ SOLN: 500.0000 mg | Freq: Four times a day (QID) | INTRAMUSCULAR | Status: DC | PRN

## 2023-03-14 MED ORDER — ACETAMINOPHEN 500 MG PO TABS
1000.0000 mg | ORAL_TABLET | Freq: Four times a day (QID) | ORAL | Status: AC
  Administered 2023-03-14 – 2023-03-15 (×3): 1000 mg via ORAL
  Filled 2023-03-14 (×4): qty 2

## 2023-03-14 MED ORDER — CEFAZOLIN SODIUM-DEXTROSE 2-4 GM/100ML-% IV SOLN
2.0000 g | Freq: Four times a day (QID) | INTRAVENOUS | Status: AC
  Administered 2023-03-14 (×2): 2 g via INTRAVENOUS
  Filled 2023-03-14 (×2): qty 100

## 2023-03-14 MED ORDER — SODIUM CHLORIDE 0.9 % IV SOLN: INTRAVENOUS | Status: DC

## 2023-03-14 MED ORDER — FLEET ENEMA RE ENEM: 1.0000 | ENEMA | Freq: Once | RECTAL | Status: DC | PRN

## 2023-03-14 MED ORDER — MENTHOL 3 MG MT LOZG: 1.0000 | LOZENGE | OROMUCOSAL | Status: DC | PRN

## 2023-03-14 MED ORDER — MIDAZOLAM HCL 2 MG/2ML IJ SOLN
4.0000 mg | INTRAMUSCULAR | Status: AC
  Administered 2023-03-14: 1 mg via INTRAVENOUS
  Filled 2023-03-14: qty 4

## 2023-03-14 MED ORDER — AMLODIPINE BESYLATE-VALSARTAN 5-160 MG PO TABS: 1.0000 | ORAL_TABLET | Freq: Every day | ORAL | Status: DC

## 2023-03-14 MED ORDER — OXYCODONE HCL 5 MG PO TABS
5.0000 mg | ORAL_TABLET | Freq: Once | ORAL | Status: DC | PRN
Start: 2023-03-14 — End: 2023-03-14

## 2023-03-14 MED ORDER — 0.9 % SODIUM CHLORIDE (POUR BTL) OPTIME
TOPICAL | Status: DC | PRN
  Administered 2023-03-14: 1000 mL

## 2023-03-14 MED ORDER — OXYBUTYNIN CHLORIDE ER 10 MG PO TB24
10.0000 mg | ORAL_TABLET | Freq: Every day | ORAL | Status: DC
  Administered 2023-03-14: 10 mg via ORAL
  Filled 2023-03-14 (×2): qty 1

## 2023-03-14 MED ORDER — ONDANSETRON HCL 4 MG/2ML IJ SOLN: 4.0000 mg | Freq: Four times a day (QID) | INTRAMUSCULAR | Status: DC | PRN

## 2023-03-14 MED ORDER — OXYCODONE HCL 5 MG/5ML PO SOLN
5.0000 mg | Freq: Once | ORAL | Status: DC | PRN
Start: 2023-03-14 — End: 2023-03-14

## 2023-03-14 MED ORDER — FENTANYL CITRATE (PF) 100 MCG/2ML IJ SOLN
INTRAMUSCULAR | Status: AC
  Filled 2023-03-14: qty 2

## 2023-03-14 MED ORDER — MORPHINE SULFATE (PF) 2 MG/ML IV SOLN
1.0000 mg | INTRAVENOUS | Status: DC | PRN
  Administered 2023-03-14 – 2023-03-15 (×3): 2 mg via INTRAVENOUS
  Filled 2023-03-14 (×3): qty 1

## 2023-03-14 MED ORDER — BUPIVACAINE IN DEXTROSE 0.75-8.25 % IT SOLN
INTRATHECAL | Status: DC | PRN
  Administered 2023-03-14: 1.6 mL via INTRATHECAL

## 2023-03-14 MED ORDER — ACETAMINOPHEN 10 MG/ML IV SOLN
1000.0000 mg | Freq: Four times a day (QID) | INTRAVENOUS | Status: DC
  Administered 2023-03-14: 1000 mg via INTRAVENOUS
  Filled 2023-03-14: qty 100

## 2023-03-14 MED ORDER — METHOCARBAMOL 500 MG PO TABS
500.0000 mg | ORAL_TABLET | Freq: Four times a day (QID) | ORAL | Status: DC | PRN
  Administered 2023-03-14 – 2023-03-15 (×2): 500 mg via ORAL
  Filled 2023-03-14 (×2): qty 1

## 2023-03-14 MED ORDER — BUPIVACAINE LIPOSOME 1.3 % IJ SUSP: 20.0000 mL | Freq: Once | INTRAMUSCULAR | Status: DC

## 2023-03-14 MED ORDER — PROPOFOL 10 MG/ML IV BOLUS
INTRAVENOUS | Status: AC
  Filled 2023-03-14: qty 20

## 2023-03-14 MED ORDER — OXYCODONE HCL 5 MG PO TABS
5.0000 mg | ORAL_TABLET | ORAL | Status: DC | PRN
Start: 2023-03-14 — End: 2023-03-15
  Administered 2023-03-14 – 2023-03-15 (×2): 10 mg via ORAL
  Filled 2023-03-14 (×2): qty 2

## 2023-03-14 MED ORDER — POLYETHYLENE GLYCOL 3350 17 G PO PACK: 17.0000 g | PACK | Freq: Every day | ORAL | Status: DC | PRN

## 2023-03-14 MED ORDER — ACETAMINOPHEN 325 MG PO TABS
325.0000 mg | ORAL_TABLET | Freq: Four times a day (QID) | ORAL | Status: DC | PRN
  Administered 2023-03-15: 650 mg via ORAL

## 2023-03-14 MED ORDER — ROPIVACAINE HCL 5 MG/ML IJ SOLN
INTRAMUSCULAR | Status: DC | PRN
Start: 2023-03-14 — End: 2023-03-14
  Administered 2023-03-14: 20 mL via PERINEURAL

## 2023-03-14 MED ORDER — PHENOL 1.4 % MT LIQD: 1.0000 | OROMUCOSAL | Status: DC | PRN

## 2023-03-14 SURGICAL SUPPLY — 44 items
ATTUNE MED DOME PAT 38 KNEE (Knees) IMPLANT
ATTUNE PS FEM LT SZ 5 CEM KNEE (Femur) IMPLANT
ATTUNE PSRP INSR SZ 5 10M KNEE (Insert) IMPLANT
BAG COUNTER SPONGE SURGICOUNT (BAG) IMPLANT
BAG ZIPLOCK 12X15 (MISCELLANEOUS) ×1 IMPLANT
BASE TIBIAL ROT PLAT SZ 5 KNEE (Knees) IMPLANT
BLADE SAG 18X100X1.27 (BLADE) ×1 IMPLANT
BLADE SAW SGTL 11.0X1.19X90.0M (BLADE) ×1 IMPLANT
BNDG ELASTIC 6INX 5YD STR LF (GAUZE/BANDAGES/DRESSINGS) ×1 IMPLANT
BOWL SMART MIX CTS (DISPOSABLE) ×1 IMPLANT
CEMENT HV SMART SET (Cement) ×2 IMPLANT
COVER SURGICAL LIGHT HANDLE (MISCELLANEOUS) ×1 IMPLANT
CUFF TRNQT CYL 34X4.125X (TOURNIQUET CUFF) ×1 IMPLANT
DERMABOND ADVANCED .7 DNX12 (GAUZE/BANDAGES/DRESSINGS) ×1 IMPLANT
DRAPE U-SHAPE 47X51 STRL (DRAPES) ×1 IMPLANT
DRSG AQUACEL AG ADV 3.5X10 (GAUZE/BANDAGES/DRESSINGS) ×1 IMPLANT
DURAPREP 26ML APPLICATOR (WOUND CARE) ×1 IMPLANT
ELECT REM PT RETURN 15FT ADLT (MISCELLANEOUS) ×1 IMPLANT
GLOVE BIO SURGEON STRL SZ 6.5 (GLOVE) IMPLANT
GLOVE BIO SURGEON STRL SZ8 (GLOVE) ×1 IMPLANT
GLOVE BIOGEL PI IND STRL 6.5 (GLOVE) IMPLANT
GLOVE BIOGEL PI IND STRL 7.0 (GLOVE) IMPLANT
GLOVE BIOGEL PI IND STRL 8 (GLOVE) ×1 IMPLANT
GOWN STRL REUS W/ TWL LRG LVL3 (GOWN DISPOSABLE) ×1 IMPLANT
HOLDER FOLEY CATH W/STRAP (MISCELLANEOUS) IMPLANT
IMMOBILIZER KNEE 20 (SOFTGOODS) ×1
IMMOBILIZER KNEE 20 THIGH 36 (SOFTGOODS) ×1 IMPLANT
KIT TURNOVER KIT A (KITS) IMPLANT
MANIFOLD NEPTUNE II (INSTRUMENTS) ×1 IMPLANT
NS IRRIG 1000ML POUR BTL (IV SOLUTION) ×1 IMPLANT
PACK TOTAL KNEE CUSTOM (KITS) ×1 IMPLANT
PADDING CAST COTTON 6X4 STRL (CAST SUPPLIES) ×2 IMPLANT
PIN STEINMAN FIXATION KNEE (PIN) IMPLANT
PROTECTOR NERVE ULNAR (MISCELLANEOUS) ×1 IMPLANT
SET HNDPC FAN SPRY TIP SCT (DISPOSABLE) ×1 IMPLANT
SUT MNCRL AB 4-0 PS2 18 (SUTURE) ×1 IMPLANT
SUT STRATAFIX 0 PDS 27 VIOLET (SUTURE) ×1
SUT VIC AB 2-0 CT1 TAPERPNT 27 (SUTURE) ×3 IMPLANT
SUTURE STRATFX 0 PDS 27 VIOLET (SUTURE) ×1 IMPLANT
TIBIAL BASE ROT PLAT SZ 5 KNEE (Knees) ×1 IMPLANT
TRAY FOLEY MTR SLVR 16FR STAT (SET/KITS/TRAYS/PACK) IMPLANT
TUBE SUCTION HIGH CAP CLEAR NV (SUCTIONS) ×1 IMPLANT
WATER STERILE IRR 1000ML POUR (IV SOLUTION) ×2 IMPLANT
WRAP KNEE MAXI GEL POST OP (GAUZE/BANDAGES/DRESSINGS) ×1 IMPLANT

## 2023-03-14 NOTE — Progress Notes (Signed)
 Orthopedic Tech Progress Note Patient Details:  Sheri Burns [birth year] [id number]  CPM Left Knee CPM Left Knee: Off Left Knee Flexion (Degrees): 10 Left Knee Extension (Degrees): 40  Post Interventions Patient Tolerated: Well  Laymon [name] 03/14/2023, 3:57 PM

## 2023-03-14 NOTE — Anesthesia Postprocedure Evaluation (Signed)
 Anesthesia Post Note  Patient: Sheri Burns  Procedure(s) Performed: TOTAL KNEE ARTHROPLASTY (Left: Knee)     Patient location during evaluation: PACU Anesthesia Type: Regional, Spinal and MAC Level of consciousness: awake and alert Pain management: pain level controlled Vital Signs Assessment: post-procedure vital signs reviewed and stable Respiratory status: spontaneous breathing, nonlabored ventilation, respiratory function stable and patient connected to nasal cannula oxygen Cardiovascular status: blood pressure returned to baseline and stable Postop Assessment: no apparent nausea or vomiting Anesthetic complications: no   No notable events documented.  Last Vitals:  Vitals:   03/14/23 1223 03/14/23 1230  BP: (!) 149/72 127/70  Pulse: 80 81  Resp: 19 16  Temp: 36.8 C   SpO2: 100% 100%    Last Pain:  Vitals:   03/14/23 1230  TempSrc:   PainSc: 3                  Thom JONELLE Peoples

## 2023-03-14 NOTE — Anesthesia Procedure Notes (Signed)
 Anesthesia Regional Block: Adductor canal block   Pre-Anesthetic Checklist: , timeout performed,  Correct Patient, Correct Site, Correct Laterality,  Correct Procedure, Correct Position, site marked,  Risks and benefits discussed,  Surgical consent,  Pre-op evaluation,  At surgeon's request and post-op pain management  Laterality: Left  Prep: chloraprep       Needles:  Injection technique: Single-shot  Needle Type: Echogenic Stimulator Needle     Needle Length: 9cm  Needle Gauge: 21     Additional Needles:   Procedures:,,,, ultrasound used (permanent image in chart),,    Narrative:  Start time: 03/14/2023 10:10 AM End time: 03/14/2023 10:13 AM Injection made incrementally with aspirations every 5 mL.  Performed by: Personally  Anesthesiologist: Erma Thom SAUNDERS, MD  Additional Notes: Discussed risks and benefits of the nerve block in detail, including but not limited vascular injury, permanent nerve damage and infection.   Patient tolerated the procedure well. Local anesthetic introduced in an incremental fashion under minimal resistance after negative aspirations. No paresthesias were elicited. After completion of the procedure, no acute issues were identified and patient continued to be monitored by RN.

## 2023-03-14 NOTE — Interval H&P Note (Signed)
 History and Physical Interval Note:  03/14/2023 8:42 AM  Sheri Burns  has presented today for surgery, with the diagnosis of left knee osteoarthritis.  The various methods of treatment have been discussed with the patient and family. After consideration of risks, benefits and other options for treatment, the patient has consented to  Procedure(s): TOTAL KNEE ARTHROPLASTY (Left) as a surgical intervention.  The patient's history has been reviewed, patient examined, no change in status, stable for surgery.  I have reviewed the patient's chart and labs.  Questions were answered to the patient's satisfaction.     Dempsey Grayson Pfefferle

## 2023-03-14 NOTE — Anesthesia Procedure Notes (Signed)
 Spinal  Patient location during procedure: OR Start time: 03/14/2023 10:55 AM End time: 03/14/2023 10:57 AM Reason for block: surgical anesthesia Staffing Performed: anesthesiologist  Anesthesiologist: Erma Thom SAUNDERS, MD Performed by: Erma Thom SAUNDERS, MD Authorized by: Erma Thom SAUNDERS, MD   Preanesthetic Checklist Completed: patient identified, IV checked, site marked, risks and benefits discussed, surgical consent, monitors and equipment checked, pre-op evaluation and timeout performed Spinal Block Patient position: sitting Prep: DuraPrep Patient monitoring: heart rate, cardiac monitor, continuous pulse ox and blood pressure Approach: midline Location: L4-5 Needle Needle type: Quincke  Needle gauge: 22 G Assessment Sensory level: T6 Additional Notes Functioning IV was confirmed and monitors were applied. Sterile prep and drape, including hand hygiene and sterile gloves were used. The patient was positioned and the spine was prepped. The skin was anesthetized with lidocaine .  Free flow of clear CSF was obtained prior to injecting local anesthetic into the CSF.  The spinal needle aspirated freely following injection.  The needle was carefully withdrawn.  The patient tolerated the procedure well.

## 2023-03-14 NOTE — Addendum Note (Signed)
 Addendum  created 03/14/23 1315 by Cornell Nation, MD   Child order released for a procedure order, Clinical Note Signed, Intraprocedure Blocks edited, SmartForm saved

## 2023-03-14 NOTE — Op Note (Signed)
 OPERATIVE REPORT-TOTAL KNEE ARTHROPLASTY   Pre-operative diagnosis- Osteoarthritis  Left knee(s)  Post-operative diagnosis- Osteoarthritis Left knee(s)  Procedure-  Left  Total Knee Arthroplasty  Surgeon- Dempsey GAILS. Mckenzi Buonomo, MD  Assistant- Zelda Kobs, PA-C   Anesthesia-   Adductor canal block and spinal  EBL-50 mL   Drains None  Tourniquet time- 32 minutes @ 300 mm Hg  Complications- None  Condition-PACU - hemodynamically stable.   Brief Clinical Note  Sheri Burns a 68 y.o. year old female with end stage OA of her left knee with progressively worsening pain and dysfunction. She has constant pain, with activity and at rest and significant functional deficits with difficulties even with ADLs. She has had extensive non-op management including analgesics, injections of cortisone and viscosupplements, and home exercise program, but remains in significant pain with significant dysfunction. Radiographs show bone on bone arthritis lateral and patellofemoral. She presents now for left Total Knee Arthroplasty.     Procedure in detail---   The patient Burns brought into the operating room and positioned supine on the operating table. After successful administration of  Adductor canal block and spinal,   a tourniquet Burns placed high on the  Left thigh(s) and the lower extremity Burns prepped and draped in the usual sterile fashion. Time out Burns performed by the operating team and then the  Left lower extremity Burns wrapped in Esmarch, knee flexed and the tourniquet inflated to 300 mmHg.       A midline incision Burns made with a ten blade through the subcutaneous tissue to the level of the extensor mechanism. A fresh blade Burns used to make a medial parapatellar arthrotomy. Soft tissue over the proximal medial tibia Burns subperiosteally elevated to the joint line with a knife and into the semimembranosus bursa with a Cobb elevator. Soft tissue over the proximal lateral tibia Burns elevated with  attention being paid to avoiding the patellar tendon on the tibial tubercle. The patella Burns everted, knee flexed 90 degrees and the ACL and PCL are removed. Findings are bone on bone lateral and patellofemoral with large global osteophytes        The drill Burns used to create a starting hole in the distal femur and the canal Burns thoroughly irrigated with sterile saline to remove the fatty contents. The 5 degree Left  valgus alignment guide Burns placed into the femoral canal and the distal femoral cutting block Burns pinned to remove 9 mm off the distal femur. Resection Burns made with an oscillating saw.      The tibia Burns subluxed forward and the menisci are removed. The extramedullary alignment guide Burns placed referencing proximally at the medial aspect of the tibial tubercle and distally along the second metatarsal axis and tibial crest. The block Burns pinned to remove 2mm off the more deficient lateral  side. Resection Burns made with an oscillating saw. Size 5is the most appropriate size for the tibia and the proximal tibia Burns prepared with the modular drill and keel punch for that size.      The femoral sizing guide Burns placed and size 5 Burns most appropriate. Rotation Burns marked off the epicondylar axis and confirmed by creating a rectangular flexion gap at 90 degrees. The size 5 cutting block Burns pinned in this rotation and the anterior, posterior and chamfer cuts are made with the oscillating saw. The intercondylar block Burns then placed and that cut Burns made.      Trial size 5 tibial component, trial size  5 posterior stabilized femur and a 10  mm posterior stabilized rotating platform insert trial Burns placed. Full extension Burns achieved with excellent varus/valgus and anterior/posterior balance throughout full range of motion. The patella Burns everted and thickness measured to be 22  mm. Free hand resection Burns taken to 12 mm, a 38 template Burns placed, lug holes are drilled, trial patella Burns placed, and it tracks normally.  Osteophytes are removed off the posterior femur with the trial in place. All trials are removed and the cut bone surfaces prepared with pulsatile lavage. Cement Burns mixed and once ready for implantation, the size 5 tibial implant, size  5 posterior stabilized femoral component, and the size 38 patella are cemented in place and the patella Burns held with the clamp. The trial insert Burns placed and the knee held in full extension. The Exparel  (20 ml mixed with 60 ml saline) Burns injected into the extensor mechanism, posterior capsule, medial and lateral gutters and subcutaneous tissues.  All extruded cement Burns removed and once the cement Burns hard the permanent 10 mm posterior stabilized rotating platform insert Burns placed into the tibial tray.      The wound Burns copiously irrigated with saline solution and the extensor mechanism closed with # 0 Stratofix suture. The tourniquet Burns released for a total tourniquet time of 32  minutes. Flexion against gravity Burns 140 degrees and the patella tracks normally. Subcutaneous tissue Burns closed with 2.0 vicryl and subcuticular with running 4.0 Monocryl. The incision Burns cleaned and dried and steri-strips and a bulky sterile dressing are applied. The limb Burns placed into a knee immobilizer and the patient Burns awakened and transported to recovery in stable condition.      Please note that a surgical assistant was a medical necessity for this procedure in order to perform it in a safe and expeditious manner. Surgical assistant was necessary to retract the ligaments and vital neurovascular structures to prevent injury to them and also necessary for proper positioning of the limb to allow for anatomic placement of the prosthesis.   Dempsey ROCKFORD Kara Melching, MD    03/14/2023, 11:57 AM

## 2023-03-14 NOTE — Anesthesia Procedure Notes (Signed)
 Date/Time: 03/14/2023 10:49 AM  Performed by: Florene Route, CRNAOxygen Delivery Method: Simple face mask

## 2023-03-14 NOTE — Discharge Instructions (Addendum)
 Sheri Gross, MD Total Joint Specialist [facility] [address], [address] [place], [place] [address] [phone]  TOTAL KNEE REPLACEMENT POSTOPERATIVE DIRECTIONS    Knee Rehabilitation, Guidelines Following Surgery  Results after knee surgery are often greatly improved when you follow the exercise, range of motion and muscle strengthening exercises prescribed by your doctor. Safety measures are also important to protect the knee from further injury. If any of these exercises cause you to have increased pain or swelling in your knee joint, decrease the amount until you are comfortable again and slowly increase them. If you have problems or questions, call your caregiver or physical therapist for advice.   BLOOD CLOT PREVENTION Take a 10 mg Xarelto once a day for three weeks following surgery.  You may resume your vitamins/supplements once you have discontinued the Xarelto. Do not take any NSAIDs (Advil, Aleve, Ibuprofen, Meloxicam, etc.) until you have discontinued the Xarelto.   HOME CARE INSTRUCTIONS  Remove items at home which could result in a fall. This includes throw rugs or furniture in walking pathways.  ICE to the affected knee as much as tolerated. Icing helps control swelling. If the swelling is well controlled you will be more comfortable and rehab easier. Continue to use ice on the knee for pain and swelling from surgery. You may notice swelling that will progress down to the foot and ankle. This is normal after surgery. Elevate the leg when you are not up walking on it.    Continue to use the breathing machine which will help keep your temperature down. It is common for your temperature to cycle up and down following surgery, especially at night when you are not up moving around and exerting yourself. The breathing machine keeps your lungs expanded and your temperature down. Do not place pillow under the operative knee, focus on keeping the knee straight  while resting  DIET You may resume your previous home diet once you are discharged from the hospital.  DRESSING / WOUND CARE / SHOWERING Keep your bulky bandage on for 2 days. On the third post-operative day you may remove the Ace bandage and gauze. There is a waterproof adhesive bandage on your skin which will stay in place until your first follow-up appointment. Once you remove this you will not need to place another bandage You may begin showering 3 days following surgery, but do not submerge the incision under water.  ACTIVITY For the first 5 days, the key is rest and control of pain and swelling Do your home exercises twice a day starting on post-operative day 3. On the days you go to physical therapy, just do the home exercises once that day. You should rest, ice and elevate the leg for 50 minutes out of every hour. Get up and walk/stretch for 10 minutes per hour. After 5 days you can increase your activity slowly as tolerated. Walk with your walker as instructed. Use the walker until you are comfortable transitioning to a cane. Walk with the cane in the opposite hand of the operative leg. You may discontinue the cane once you are comfortable and walking steadily. Avoid periods of inactivity such as sitting longer than an hour when not asleep. This helps prevent blood clots.  You may discontinue the knee immobilizer once you are able to perform a straight leg raise while lying down. You may resume a sexual relationship in one month or when given the OK by your doctor.  You may return to work once  you are cleared by your doctor.  Do not drive a car for 6 weeks or until released by your surgeon.  Do not drive while taking narcotics.  TED HOSE STOCKINGS Wear the elastic stockings on both legs for three weeks following surgery during the day. You may remove them at night for sleeping.  WEIGHT BEARING Weight bearing as tolerated with assist device (walker, cane, etc) as directed, use it as  long as suggested by your surgeon or therapist, typically at least 4-6 weeks.  POSTOPERATIVE CONSTIPATION PROTOCOL Constipation - defined medically as fewer than three stools per week and severe constipation as less than one stool per week.  One of the most common issues patients have following surgery is constipation.  Even if you have a regular bowel pattern at home, your normal regimen is likely to be disrupted due to multiple reasons following surgery.  Combination of anesthesia, postoperative narcotics, change in appetite and fluid intake all can affect your bowels.  In order to avoid complications following surgery, here are some recommendations in order to help you during your recovery period.  Colace (docusate) - Pick up an over-the-counter form of Colace or another stool softener and take twice a day as long as you are requiring postoperative pain medications.  Take with a full glass of water daily.  If you experience loose stools or diarrhea, hold the colace until you stool forms back up. If your symptoms do not get better within 1 week or if they get worse, check with your doctor. Dulcolax (bisacodyl) - Pick up over-the-counter and take as directed by the product packaging as needed to assist with the movement of your bowels.  Take with a full glass of water.  Use this product as needed if not relieved by Colace only.  MiraLax (polyethylene glycol) - Pick up over-the-counter to have on hand. MiraLax is a solution that will increase the amount of water in your bowels to assist with bowel movements.  Take as directed and can mix with a glass of water, juice, soda, coffee, or tea. Take if you go more than two days without a movement. Do not use MiraLax more than once per day. Call your doctor if you are still constipated or irregular after using this medication for 7 days in a row.  If you continue to have problems with postoperative constipation, please contact the office for further assistance  and recommendations.  If you experience "the worst abdominal pain ever" or develop nausea or vomiting, please contact the office immediatly for further recommendations for treatment.  ITCHING If you experience itching with your medications, try taking only a single pain pill, or even half a pain pill at a time.  You can also use Benadryl over the counter for itching or also to help with sleep.   MEDICATIONS See your medication summary on the "After Visit Summary" that the nursing staff will review with you prior to discharge.  You may have some home medications which will be placed on hold until you complete the course of blood thinner medication.  It is important for you to complete the blood thinner medication as prescribed by your surgeon.  Continue your approved medications as instructed at time of discharge.  PRECAUTIONS If you experience chest pain or shortness of breath - call 911 immediately for transfer to the hospital emergency department.  If you develop a fever greater that 101 F, purulent drainage from wound, increased redness or drainage from wound, foul odor from the wound/dressing,  or calf pain - CONTACT YOUR SURGEON.                                                   FOLLOW-UP APPOINTMENTS Make sure you keep all of your appointments after your operation with your surgeon and caregivers. You should call the office at the above phone number and make an appointment for approximately two weeks after the date of your surgery or on the date instructed by your surgeon outlined in the "After Visit Summary".  RANGE OF MOTION AND STRENGTHENING EXERCISES  Rehabilitation of the knee is important following a knee injury or an operation. After just a few days of immobilization, the muscles of the thigh which control the knee become weakened and shrink (atrophy). Knee exercises are designed to build up the tone and strength of the thigh muscles and to improve knee motion. Often times heat used for  twenty to thirty minutes before working out will loosen up your tissues and help with improving the range of motion but do not use heat for the first two weeks following surgery. These exercises can be done on a training (exercise) mat, on the floor, on a table or on a bed. Use what ever works the best and is most comfortable for you Knee exercises include:  Leg Lifts - While your knee is still immobilized in a splint or cast, you can do straight leg raises. Lift the leg to 60 degrees, hold for 3 sec, and slowly lower the leg. Repeat 10-20 times 2-3 times daily. Perform this exercise against resistance later as your knee gets better.  Quad and Hamstring Sets - Tighten up the muscle on the front of the thigh (Quad) and hold for 5-10 sec. Repeat this 10-20 times hourly. Hamstring sets are done by pushing the foot backward against an object and holding for 5-10 sec. Repeat as with quad sets.  Leg Slides: Lying on your back, slowly slide your foot toward your buttocks, bending your knee up off the floor (only go as far as is comfortable). Then slowly slide your foot back down until your leg is flat on the floor again. Angel Wings: Lying on your back spread your legs to the side as far apart as you can without causing discomfort.  A rehabilitation program following serious knee injuries can speed recovery and prevent re-injury in the future due to weakened muscles. Contact your doctor or a physical therapist for more information on knee rehabilitation.   POST-OPERATIVE OPIOID TAPER INSTRUCTIONS: It is important to wean off of your opioid medication as soon as possible. If you do not need pain medication after your surgery it is ok to stop day one. Opioids include: Codeine, Hydrocodone(Norco, Vicodin), Oxycodone(Percocet, oxycontin) and hydromorphone amongst others.  Long term and even short term use of opiods can cause: Increased pain response Dependence Constipation Depression Respiratory depression And  more.  Withdrawal symptoms can include Flu like symptoms Nausea, vomiting And more Techniques to manage these symptoms Hydrate well Eat regular healthy meals Stay active Use relaxation techniques(deep breathing, meditating, yoga) Do Not substitute Alcohol to help with tapering If you have been on opioids for less than two weeks and do not have pain than it is ok to stop all together.  Plan to wean off of opioids This plan should start within one week post op of  your joint replacement. Maintain the same interval or time between taking each dose and first decrease the dose.  Cut the total daily intake of opioids by one tablet each day Next start to increase the time between doses. The last dose that should be eliminated is the evening dose.   IF YOU ARE TRANSFERRED TO A SKILLED REHAB FACILITY If the patient is transferred to a skilled rehab facility following release from the hospital, a list of the current medications will be sent to the facility for the patient to continue.  When discharged from the skilled rehab facility, please have the facility set up the patient's Home Health Physical Therapy prior to being released. Also, the skilled facility will be responsible for providing the patient with their medications at time of release from the facility to include their pain medication, the muscle relaxants, and their blood thinner medication. If the patient is still at the rehab facility at time of the two week follow up appointment, the skilled rehab facility will also need to assist the patient in arranging follow up appointment in our office and any transportation needs.  MAKE SURE YOU:  Understand these instructions.  Get help right away if you are not doing well or get worse.   DENTAL ANTIBIOTICS:  In most cases prophylactic antibiotics for Dental procdeures after total joint surgery are not necessary.  Exceptions are as follows:  1. History of prior total joint infection  2.  Severely immunocompromised (Organ Transplant, cancer chemotherapy, Rheumatoid biologic meds such as Humera)  3. Poorly controlled diabetes (A1C &gt; 8.0, blood glucose over 200)  If you have one of these conditions, contact your surgeon for an antibiotic prescription, prior to your dental procedure.    Pick up stool softner and laxative for home use following surgery while on pain medications. Do not submerge incision under water. Please use good hand washing techniques while changing dressing each day. May shower starting three days after surgery. Please use a clean towel to pat the incision dry following showers. Continue to use ice for pain and swelling after surgery. Do not use any lotions or creams on the incision until instructed by your surgeon.  Information on my medicine - XARELTO (Rivaroxaban)  This medication education was reviewed with me or my healthcare representative as part of my discharge preparation.    Why was Xarelto prescribed for you? Xarelto was prescribed for you to reduce the risk of blood clots forming after orthopedic surgery. The medical term for these abnormal blood clots is venous thromboembolism (VTE).  What do you need to know about xarelto ? Take your Xarelto ONCE DAILY at the same time every day. You may take it either with or without food.  If you have difficulty swallowing the tablet whole, you may crush it and mix in applesauce just prior to taking your dose.  Take Xarelto exactly as prescribed by your doctor and DO NOT stop taking Xarelto without talking to the doctor who prescribed the medication.  Stopping without other VTE prevention medication to take the place of Xarelto may increase your risk of developing a clot.  After discharge, you should have regular check-up appointments with your healthcare provider that is prescribing your Xarelto.    What do you do if you miss a dose? If you miss a dose, take it as soon as you remember  on the same day then continue your regularly scheduled once daily regimen the next day. Do not take two doses of Xarelto on  the same day.   Important Safety Information A possible side effect of Xarelto is bleeding. You should call your healthcare provider right away if you experience any of the following: Bleeding from an injury or your nose that does not stop. Unusual colored urine (red or dark brown) or unusual colored stools (red or black). Unusual bruising for unknown reasons. A serious fall or if you hit your head (even if there is no bleeding).  Some medicines may interact with Xarelto and might increase your risk of bleeding while on Xarelto. To help avoid this, consult your healthcare provider or pharmacist prior to using any new prescription or non-prescription medications, including herbals, vitamins, non-steroidal anti-inflammatory drugs (NSAIDs) and supplements.  This website has more information on Xarelto: VisitDestination.com.br.

## 2023-03-14 NOTE — Progress Notes (Signed)
 Orthopedic Tech Progress Note Patient Details:  Sheri Burns 1956-02-06 [id number]  CPM Left Knee CPM Left Knee: On Left Knee Flexion (Degrees): 10 Left Knee Extension (Degrees): 40  Post Interventions Patient Tolerated: Well  Sheri Burns 03/14/2023, 12:39 PM

## 2023-03-14 NOTE — Transfer of Care (Signed)
 Immediate Anesthesia Transfer of Care Note  Patient: Sheri Burns  Procedure(s) Performed: TOTAL KNEE ARTHROPLASTY (Left: Knee)  Patient Location: PACU  Anesthesia Type:Spinal  Level of Consciousness: awake, alert , and oriented  Airway & Oxygen Therapy: Patient Spontanous Breathing and Patient connected to face mask oxygen  Post-op Assessment: Report given to RN and Post -op Vital signs reviewed and stable  Post vital signs: Reviewed and stable  Last Vitals:  Vitals Value Taken Time  BP 149/72 03/14/23 1222  Temp    Pulse 80 03/14/23 1223  Resp 19 03/14/23 1223  SpO2 100 % 03/14/23 1223  Vitals shown include unfiled device data.  Last Pain:  Vitals:   03/14/23 1008  TempSrc: Oral  PainSc:          Complications: No notable events documented.

## 2023-03-14 NOTE — Evaluation (Signed)
 Physical Therapy Evaluation Patient Details Name: Sheri Burns MRN: [id number] DOB: 1955-12-22 Today's Date: 03/14/2023  History of Present Illness  Pt is 68 yo female admitted on 03/14/23 for L TKA.  Pt with hx including but not limited to vertigo, osteoporosis, MS, OA, Pt reports back fusion T7-S1 about 2 years ago, chronic fatigue  Clinical Impression  Pt is s/p TKA resulting in the deficits listed below (see PT Problem List). At baseline, pt is independent. She typically does not use AD unless with MS flair.  She lives alone but her daughter is staying with her at d/c and she has DME.  Today, pt with increased pain but willing and wants to try therapy.  Pt required min A for transfers and for a few steps to the bed.  She had good quad activation with no ext lag.  Further ambulation limited due to pain.  Pt expected to progress well with therapy.  Pt will benefit from acute skilled PT to increase their independence and safety with mobility to allow discharge.          If plan is discharge home, recommend the following: A little help with walking and/or transfers;A little help with bathing/dressing/bathroom;Assistance with cooking/housework;Help with stairs or ramp for entrance   Can travel by private vehicle        Equipment Recommendations None recommended by PT  Recommendations for Other Services       Functional Status Assessment Patient has had a recent decline in their functional status and demonstrates the ability to make significant improvements in function in a reasonable and predictable amount of time.     Precautions / Restrictions Precautions Precautions: Fall Restrictions Weight Bearing Restrictions Per Provider Order: No LLE Weight Bearing Per Provider Order: Weight bearing as tolerated      Mobility  Bed Mobility Overal bed mobility: Needs Assistance Bed Mobility: Supine to Sit     Supine to sit: Min assist     General bed mobility comments:  increased time, assistance with L LE    Transfers Overall transfer level: Needs assistance Equipment used: Rolling walker (2 wheels) Transfers: Sit to/from Stand, Bed to chair/wheelchair/BSC Sit to Stand: Min assist   Step pivot transfers: Min assist       General transfer comment: cues for hand placement and L LE management; min A for a few steps to the chair    Ambulation/Gait Ambulation/Gait assistance: Min assist Gait Distance (Feet): 3 Feet Assistive device: Rolling walker (2 wheels) Gait Pattern/deviations: Step-to pattern, Decreased stride length, Decreased weight shift to left Gait velocity: decreased     General Gait Details: Small steps to chair with cues for sequencign and RW use  Stairs            Wheelchair Mobility     Tilt Bed    Modified Rankin (Stroke Patients Only)       Balance Overall balance assessment: Needs assistance Sitting-balance support: No upper extremity supported Sitting balance-Leahy Scale: Good     Standing balance support: Bilateral upper extremity supported, Reliant on assistive device for balance Standing balance-Leahy Scale: Poor                               Pertinent Vitals/Pain Pain Assessment Pain Assessment: 0-10 Pain Score: 7  Pain Location: L knee Pain Descriptors / Indicators: Discomfort Pain Intervention(s): Limited activity within patient's tolerance, Monitored during session, Repositioned, Premedicated before session, Ice applied  Home Living Family/patient expects to be discharged to:: Private residence Living Arrangements: Alone Available Help at Discharge: Family;Available 24 hours/day (dtr staying) Type of Home: House Home Access: Ramped entrance       Home Layout: One level Home Equipment: Grab bars - tub/shower;Shower seat;Grab bars - toilet;Rolling Walker (2 wheels);Cane - single point;Rollator (4 wheels) Additional Comments: has borrowed ice machine and ice packs    Prior  Function Prior Level of Function : Independent/Modified Independent;Driving             Mobility Comments: Could ambulate in community without AD most of the time. Does have MS and has had to use RW at times during flair ADLs Comments: independent adls and iadls     Extremity/Trunk Assessment   Upper Extremity Assessment Upper Extremity Assessment: Overall WFL for tasks assessed    Lower Extremity Assessment Lower Extremity Assessment: LLE deficits/detail;RLE deficits/detail RLE Deficits / Details: ROM WFL; MMT 5/5 LLE Deficits / Details: Expected post op changes; ROM: knee 8 to 60 degrees; MMT ankle 5/5, hip and knee 3/5    Cervical / Trunk Assessment Cervical / Trunk Assessment: Other exceptions;Back Surgery Cervical / Trunk Exceptions: Pt reports fused T7-S1  Communication      Cognition Arousal: Alert Behavior During Therapy: WFL for tasks assessed/performed Overall Cognitive Status: Within Functional Limits for tasks assessed                                          General Comments General comments (skin integrity, edema, etc.): VSS    Exercises     Assessment/Plan    PT Assessment Patient needs continued PT services  PT Problem List Decreased strength;Pain;Decreased range of motion;Decreased activity tolerance;Decreased knowledge of use of DME;Decreased balance;Decreased mobility;Decreased knowledge of precautions       PT Treatment Interventions DME instruction;Therapeutic exercise;Gait training;Balance training;Stair training;Functional mobility training;Therapeutic activities;Patient/family education;Modalities    PT Goals (Crotty goals can be found in the Care Plan section)  Acute Rehab PT Goals Patient Stated Goal: return home PT Goal Formulation: With patient Time For Goal Achievement: 03/28/23 Potential to Achieve Goals: Good    Frequency 7X/week     Co-evaluation               AM-PAC PT 6 Clicks Mobility  Outcome  Measure Help needed turning from your back to your side while in a flat bed without using bedrails?: A Little Help needed moving from lying on your back to sitting on the side of a flat bed without using bedrails?: A Little Help needed moving to and from a bed to a chair (including a wheelchair)?: A Little Help needed standing up from a chair using your arms (e.g., wheelchair or bedside chair)?: A Little Help needed to walk in hospital room?: Total Help needed climbing 3-5 steps with a railing? : Total 6 Click Score: 14    End of Session Equipment Utilized During Treatment: Gait belt Activity Tolerance: Patient limited by pain Patient left: with call bell/phone within reach;with chair alarm set;in chair;with family/visitor present;with SCD's reapplied Nurse Communication: Mobility status PT Visit Diagnosis: Other abnormalities of gait and mobility (R26.89);Muscle weakness (generalized) (M62.81)    Time: 8391-8370 PT Time Calculation (min) (ACUTE ONLY): 21 min   Charges:   PT Evaluation $PT Eval Low Complexity: 1 Low   PT General Charges $$ ACUTE PT VISIT: 1 Visit  Jakel Alphin, PT Acute Rehab [facility] [phone]   Benjiman VEAR Mulberry 03/14/2023, 4:49 PM

## 2023-03-15 ENCOUNTER — Encounter (HOSPITAL_COMMUNITY): Payer: Self-pay | Admitting: Orthopedic Surgery

## 2023-03-15 DIAGNOSIS — I10 Essential (primary) hypertension: Secondary | ICD-10-CM | POA: Diagnosis not present

## 2023-03-15 DIAGNOSIS — M1712 Unilateral primary osteoarthritis, left knee: Secondary | ICD-10-CM | POA: Diagnosis not present

## 2023-03-15 DIAGNOSIS — Z79899 Other long term (current) drug therapy: Secondary | ICD-10-CM | POA: Diagnosis not present

## 2023-03-15 DIAGNOSIS — Z87891 Personal history of nicotine dependence: Secondary | ICD-10-CM | POA: Diagnosis not present

## 2023-03-15 LAB — BASIC METABOLIC PANEL
Anion gap: 6 (ref 5–15)
BUN: 11 mg/dL (ref 8–23)
CO2: 24 mmol/L (ref 22–32)
Calcium: 8.4 mg/dL — ABNORMAL LOW (ref 8.9–10.3)
Chloride: 106 mmol/L (ref 98–111)
Creatinine, Ser: 0.7 mg/dL (ref 0.44–1.00)
GFR, Estimated: >60 mL/min (ref >60)
Glucose, Bld: 117 mg/dL — ABNORMAL HIGH (ref 70–99)
Potassium: 3.4 mmol/L — ABNORMAL LOW (ref 3.5–5.1)
Sodium: 136 mmol/L (ref 135–145)

## 2023-03-15 LAB — CBC
HCT: 32.4 % — ABNORMAL LOW (ref 36.0–46.0)
Hemoglobin: 10.1 g/dL — ABNORMAL LOW (ref 12.0–15.0)
MCH: 30.9 pg (ref 26.0–34.0)
MCHC: 31.2 g/dL (ref 30.0–36.0)
MCV: 99.1 fL (ref 80.0–100.0)
Platelets: 312 10*3/uL (ref 150–400)
RBC: 3.27 MIL/uL — ABNORMAL LOW (ref 3.87–5.11)
RDW: 12.9 % (ref 11.5–15.5)
WBC: 13 10*3/uL — ABNORMAL HIGH (ref 4.0–10.5)
nRBC: 0 % (ref 0.0–0.2)

## 2023-03-15 MED ORDER — OXYCODONE HCL 5 MG PO TABS: 5.0000 mg | ORAL_TABLET | Freq: Four times a day (QID) | ORAL | 0 refills | Status: DC | PRN

## 2023-03-15 MED ORDER — ONDANSETRON HCL 4 MG PO TABS: 4.0000 mg | ORAL_TABLET | Freq: Four times a day (QID) | ORAL | 0 refills | Status: DC | PRN

## 2023-03-15 MED ORDER — RIVAROXABAN 10 MG PO TABS: 10.0000 mg | ORAL_TABLET | Freq: Every day | ORAL | 0 refills | Status: DC

## 2023-03-15 MED ORDER — METHOCARBAMOL 500 MG PO TABS: 500.0000 mg | ORAL_TABLET | Freq: Four times a day (QID) | ORAL | 0 refills | Status: DC | PRN

## 2023-03-15 NOTE — Progress Notes (Signed)
 Pt due to void @ 1000

## 2023-03-15 NOTE — Care Management Obs Status (Signed)
 MEDICARE OBSERVATION STATUS NOTIFICATION   Patient Details  Name: Neveen Daponte Meding MRN: [id number] Date of Birth: 1956-01-22   Medicare Observation Status Notification Given:       Howell Rucks, RN 03/15/2023, 12:20 PM

## 2023-03-15 NOTE — TOC Transition Note (Signed)
 Transition of Care [facility]) - Discharge Note   Patient Details  Name: Sheri Burns MRN: [id number] Date of Birth: September 11, 1955  Transition of Care [facility]) CM/SW Contact:  Alfonse JONELLE Rex, RN Phone Number: 03/15/2023, 4:06 PM   Clinical Narrative:   Met with pt, confirmed has all needed DME in the home. Outpatient PT already arranged with [facility]. No further TOC needs.     Final next level of care: OP Rehab Barriers to Discharge: No Barriers Identified   Patient Goals and CMS Choice Patient states their goals for this hospitalization and ongoing recovery are:: return home          Discharge Placement                       Discharge Plan and Services Additional resources added to the After Visit Summary for                                       Social Drivers of Health (SDOH) Interventions SDOH Screenings   Food Insecurity: No Food Insecurity (03/14/2023)  Housing: Low Risk  (03/14/2023)  Transportation Needs: No Transportation Needs (03/14/2023)  Utilities: Not At Risk (03/14/2023)  Social Connections: Unknown (03/14/2023)  Tobacco Use: Medium Risk (03/14/2023)     Readmission Risk Interventions     No data to display

## 2023-03-15 NOTE — Progress Notes (Signed)
 Physical Therapy Treatment Patient Details Name: Sheri Burns MRN: [id number] DOB: 01-05-1956 Today's Date: 03/15/2023   History of Present Illness Pt is 68 yo female admitted on 03/14/23 for L TKA.  Pt with hx including but not limited to vertigo, osteoporosis, MS, OA, Pt reports back fusion T7-S1 about 2 years ago, chronic fatigue    PT Comments  Pt is POD # 1 and is progressing well.  Pt demonstrates safe gait & transfers in order to return home from PT perspective once discharged by MD.  She was able to ambulate 61' with RW and supervision, has support at home, and good understanding of HEP.  Pt reports having outpt PT scheduled later in the week.  She does rate pain at 9/10 but has no signs of severe pain, reports tolerable and comfortable with returning home. While in hospital, will continue to benefit from PT for skilled therapy to advance mobility and exercises.       If plan is discharge home, recommend the following: A little help with walking and/or transfers;A little help with bathing/dressing/bathroom;Assistance with cooking/housework;Help with stairs or ramp for entrance   Can travel by private vehicle        Equipment Recommendations  None recommended by PT    Recommendations for Other Services       Precautions / Restrictions Precautions Precautions: Fall Restrictions LLE Weight Bearing Per Provider Order: Weight bearing as tolerated     Mobility  Bed Mobility Overal bed mobility: Needs Assistance Bed Mobility: Supine to Sit, Sit to Supine     Supine to sit: Supervision Sit to supine: Supervision   General bed mobility comments: Uses R LE to assist L LE    Transfers Overall transfer level: Needs assistance Equipment used: Rolling walker (2 wheels) Transfers: Sit to/from Stand Sit to Stand: Supervision           General transfer comment: cues for hand placement and L LE management; STSx2    Ambulation/Gait Ambulation/Gait assistance:  Supervision Gait Distance (Feet): 80 Feet Assistive device: Rolling walker (2 wheels) Gait Pattern/deviations: Step-to pattern, Decreased stride length, Decreased weight shift to left Gait velocity: decreased but functional     General Gait Details: Good carryover of RW proximity; educated on progressing to step through as able   Optometrist     Tilt Bed    Modified Rankin (Stroke Patients Only)       Balance Overall balance assessment: Needs assistance Sitting-balance support: No upper extremity supported Sitting balance-Leahy Scale: Good     Standing balance support: Bilateral upper extremity supported, No upper extremity supported Standing balance-Leahy Scale: Fair Standing balance comment: Steady wtih RW to ambulate; could static stand at sink without UE support                            Cognition Arousal: Alert Behavior During Therapy: WFL for tasks assessed/performed Overall Cognitive Status: Within Functional Limits for tasks assessed                                          Exercises Total Joint Exercises Ankle Circles/Pumps: AROM, Both, 10 reps, Supine Quad Sets: AROM, Both, 10 reps, Supine Heel Slides: AAROM, Left, Supine (only 1 rep to demonstrate, limited by pain; gait belt for  AAROM) Hip ABduction/ADduction: AAROM, Left, Supine, 10 reps Long Arc Quad: AAROM, Left, 5 reps, Seated Knee Flexion: AAROM, Left, Seated, 10 reps Goniometric ROM: L knee ~10 to 60 degrees Other Exercises Other Exercises: Performed limited ROM and provided AAROM for pain control    General Comments General comments (skin integrity, edema, etc.): VSS      Pertinent Vitals/Pain Pain Assessment Pain Assessment: 0-10 Pain Score: 9  Pain Location: L knee Pain Descriptors / Indicators: Discomfort Pain Intervention(s): Limited activity within patient's tolerance, Monitored during session, Premedicated before  session, Ice applied (REports 9/10 but agreeable and no signs of severe pain)    Home Living                          Prior Function            PT Goals ([place] goals can now be found in the care plan section) Progress towards PT goals: Progressing toward goals    Frequency    7X/week      PT Plan      Co-evaluation              AM-PAC PT 6 Clicks Mobility   Outcome Measure  Help needed turning from your back to your side while in a flat bed without using bedrails?: A Little Help needed moving from lying on your back to sitting on the side of a flat bed without using bedrails?: A Little Help needed moving to and from a bed to a chair (including a wheelchair)?: A Little Help needed standing up from a chair using your arms (e.g., wheelchair or bedside chair)?: A Little Help needed to walk in hospital room?: A Little Help needed climbing 3-5 steps with a railing? : A Little 6 Click Score: 18    End of Session Equipment Utilized During Treatment: Gait belt Activity Tolerance: Patient tolerated treatment well Patient left: with call bell/phone within reach;with family/visitor present;in bed Nurse Communication: Mobility status PT Visit Diagnosis: Other abnormalities of gait and mobility (R26.89);Muscle weakness (generalized) (M62.81)     Time: 8654-8578 PT Time Calculation (min) (ACUTE ONLY): 36 min  Charges:    $Gait Training: 8-22 mins $Therapeutic Exercise: 8-22 mins PT General Charges $$ ACUTE PT VISIT: 1 Visit                     Benjiman, PT Acute Rehab Services [facility] [phone]    Benjiman VEAR Mulberry 03/15/2023, 2:28 PM

## 2023-03-15 NOTE — Progress Notes (Signed)
 Physical Therapy Treatment Patient Details Name: Sheri Burns MRN: [id number] DOB: May 28, 1955 Today's Date: 03/15/2023   History of Present Illness Pt is 68 yo female admitted on 03/14/23 for L TKA.  Pt with hx including but not limited to vertigo, osteoporosis, MS, OA, Pt reports back fusion T7-S1 about 2 years ago, chronic fatigue    PT Comments  Pt making good progress today.  She reports pain at 9/10 but tolerable and no signs of severe pain.  Did limit exercises for pain control. She was able to ambulate 25' with RW and CGA.  Plan to f/u in afternoon to further progress and assess pain control after activity this morning.      If plan is discharge home, recommend the following: A little help with walking and/or transfers;A little help with bathing/dressing/bathroom;Assistance with cooking/housework;Help with stairs or ramp for entrance   Can travel by private vehicle        Equipment Recommendations  None recommended by PT    Recommendations for Other Services       Precautions / Restrictions Precautions Precautions: Fall Restrictions LLE Weight Bearing Per Provider Order: Weight bearing as tolerated     Mobility  Bed Mobility Overal bed mobility: Needs Assistance Bed Mobility: Supine to Sit     Supine to sit: Contact guard          Transfers Overall transfer level: Needs assistance Equipment used: Rolling walker (2 wheels) Transfers: Sit to/from Stand Sit to Stand: Contact guard assist           General transfer comment: cues for hand placement and L LE management; STSx2    Ambulation/Gait Ambulation/Gait assistance: Contact guard assist Gait Distance (Feet): 70 Feet Assistive device: Rolling walker (2 wheels) Gait Pattern/deviations: Step-to pattern, Decreased stride length, Decreased weight shift to left Gait velocity: decreased but functional     General Gait Details: Small steps wtih cues for RW proximity   Stairs              Wheelchair Mobility     Tilt Bed    Modified Rankin (Stroke Patients Only)       Balance Overall balance assessment: Needs assistance Sitting-balance support: No upper extremity supported Sitting balance-Leahy Scale: Good     Standing balance support: Bilateral upper extremity supported, No upper extremity supported Standing balance-Leahy Scale: Fair Standing balance comment: Steady wtih RW to ambulate; could static stand at sink without UE support                            Cognition Arousal: Alert Behavior During Therapy: WFL for tasks assessed/performed Overall Cognitive Status: Within Functional Limits for tasks assessed                                          Exercises Total Joint Exercises Ankle Circles/Pumps: AROM, Both, 10 reps, Supine Quad Sets: AROM, Both, 10 reps, Supine Heel Slides: AAROM, Left, 5 reps, Supine Hip ABduction/ADduction: AAROM, Left, 5 reps, Supine Long Arc Quad: AAROM, Left, 5 reps, Seated Knee Flexion: AAROM, Left, 5 reps, Seated Goniometric ROM: L knee 10 to 50 degrees Other Exercises Other Exercises: Performed limited ROM and provided AAROM for pain control    General Comments General comments (skin integrity, edema, etc.): VSS      Pertinent Vitals/Pain Pain Assessment Pain Assessment: 0-10 Pain Score:  9  Pain Location: L knee Pain Descriptors / Indicators: Discomfort Pain Intervention(s): Limited activity within patient's tolerance, Monitored during session, Repositioned, Premedicated before session, Ice applied (Reports pain at 9/10, but agreeable to therapy and no signs of severe pain)    Home Living                          Prior Function            PT Goals ([place] goals can now be found in the care plan section) Progress towards PT goals: Progressing toward goals    Frequency    7X/week      PT Plan      Co-evaluation              AM-PAC PT 6 Clicks  Mobility   Outcome Measure  Help needed turning from your back to your side while in a flat bed without using bedrails?: A Little Help needed moving from lying on your back to sitting on the side of a flat bed without using bedrails?: A Little Help needed moving to and from a bed to a chair (including a wheelchair)?: A Little Help needed standing up from a chair using your arms (e.g., wheelchair or bedside chair)?: A Little Help needed to walk in hospital room?: A Little Help needed climbing 3-5 steps with a railing? : A Little 6 Click Score: 18    End of Session Equipment Utilized During Treatment: Gait belt Activity Tolerance: Patient limited by pain Patient left: with call bell/phone within reach;with chair alarm set;in chair;with family/visitor present Nurse Communication: Mobility status PT Visit Diagnosis: Other abnormalities of gait and mobility (R26.89);Muscle weakness (generalized) (M62.81)     Time: 9042-8975 PT Time Calculation (min) (ACUTE ONLY): 27 min  Charges:    $Gait Training: 8-22 mins $Therapeutic Exercise: 8-22 mins PT General Charges $$ ACUTE PT VISIT: 1 Visit                     Benjiman, PT Acute Rehab Services [facility] [phone]    Benjiman VEAR Mulberry 03/15/2023, 1:30 PM

## 2023-03-15 NOTE — Progress Notes (Signed)
 Subjective: 1 Day Post-Op Procedure(s) (LRB): TOTAL KNEE ARTHROPLASTY (Left) Patient reports pain as mild.   Patient seen in rounds by Dr. Melodi. Patient is well, and has had no acute complaints or problems No issues overnight. Denies chest pain, SOB, or calf pain. Foley catheter removed this AM.  We will continue therapy today  Objective: Vital signs in last 24 hours: Temp:  [97.9 F (36.6 C)-98.6 F (37 C)] 98.3 F (36.8 C) (01/07 0642) Pulse Rate:  [75-87] 75 (01/07 0642) Resp:  [11-24] 17 (01/07 0642) BP: (125-160)/(61-89) 146/89 (01/07 0642) SpO2:  [94 %-100 %] 100 % (01/07 0642) Weight:  [54 kg] 54 kg (01/06 0854)  Intake/Output from previous day:  Intake/Output Summary (Last 24 hours) at 03/15/2023 0831 Last data filed at 03/15/2023 0609 Gross per 24 hour  Intake 2566.25 ml  Output 2375 ml  Net 191.25 ml     Intake/Output this shift: No intake/output data recorded.  Labs: Recent Labs    03/15/23 0350  HGB 10.1*   Recent Labs    03/15/23 0350  WBC 13.0*  RBC 3.27*  HCT 32.4*  PLT 312   Recent Labs    03/15/23 0350  NA 136  K 3.4*  CL 106  CO2 24  BUN 11  CREATININE 0.70  GLUCOSE 117*  CALCIUM 8.4*   No results for input(s): LABPT, INR in the last 72 hours.  Exam: General - Patient is Alert and Oriented Extremity - Neurologically intact Neurovascular intact Sensation intact distally Dorsiflexion/Plantar flexion intact Dressing - dressing C/D/I Motor Function - intact, moving foot and toes well on exam.   Past Medical History:  Diagnosis Date   Anemia    Bulging lumbar disc 1980   Chronic pansinusitis    Colon polyp    GERD (gastroesophageal reflux disease)    Gynecological examination    sees Dr. Montie Romine    Hypertension    Multiple sclerosis [facility]) since 1992   Dr. Charlie Crete in [facility]   Multiple thyroid nodules 1995   per pt. resolved on its own follwed by endo then released   Neurogenic bladder disorder  12/2009   w/ [facility] dx. Dr. McDiarmid   Neuropathy    Osteoarthritis    Osteoporosis    last DEXA 2018   Overactive bladder    sees Dr. Glendia McDiarmid    Pancreatitis 1977   Ulcerative proctitis (HCC)    Vision abnormalities     Assessment/Plan: 1 Day Post-Op Procedure(s) (LRB): TOTAL KNEE ARTHROPLASTY (Left) Principal Problem:   OA (osteoarthritis) of knee Active Problems:   Osteoarthritis of left knee  Estimated body mass index is 19.81 kg/m as calculated from the following:   Height as of this encounter: 5' 5 (1.651 m).   Weight as of this encounter: 54 kg. Advance diet Up with therapy D/C IV fluids   Patient's anticipated LOS is less than 2 midnights, meeting these requirements: - Lives within 1 hour of care - Has a competent adult at home to recover with post-op recover - NO history of  - Chronic pain requiring opioids  - Diabetes  - Coronary Artery Disease  - Heart failure  - Heart attack  - Stroke  - DVT/VTE  - Cardiac arrhythmia  - Respiratory Failure/COPD  - Renal failure  - Anemia  - Advanced Liver disease  DVT Prophylaxis - Xarelto  Weight bearing as tolerated. Continue therapy.  Patient with history of MS, but has good support at home.  Discharge will be dependent on how she progresses with therapy today.  Plan is to go Home after hospital stay. Plan for discharge later today if progresses with therapy and meeting goals. Scheduled for OPPT at [facility]. Follow-up in the office in 2 weeks.  The PDMP database was reviewed today prior to any opioid medications being prescribed to this patient.  Roxie Mess, PA-C Orthopedic Surgery [phone] 03/15/2023, 8:31 AM

## 2023-03-15 NOTE — Plan of Care (Signed)
  Problem: Education: Goal: Knowledge of General Education information will improve Description: Including pain rating scale, medication(s)/side effects and non-pharmacologic comfort measures Outcome: Progressing   Problem: Clinical Measurements: Goal: Ability to maintain clinical measurements within normal limits will improve Outcome: Progressing   Problem: Coping: Goal: Level of anxiety will decrease Outcome: Progressing   Problem: Pain Management: Goal: General experience of comfort will improve Outcome: Progressing

## 2023-03-17 ENCOUNTER — Ambulatory Visit (HOSPITAL_BASED_OUTPATIENT_CLINIC_OR_DEPARTMENT_OTHER): Payer: Medicare Other | Attending: Orthopedic Surgery | Admitting: Physical Therapy

## 2023-03-17 ENCOUNTER — Encounter (HOSPITAL_BASED_OUTPATIENT_CLINIC_OR_DEPARTMENT_OTHER): Payer: Self-pay | Admitting: Physical Therapy

## 2023-03-17 ENCOUNTER — Other Ambulatory Visit: Payer: Self-pay

## 2023-03-17 DIAGNOSIS — R262 Difficulty in walking, not elsewhere classified: Secondary | ICD-10-CM | POA: Diagnosis not present

## 2023-03-17 DIAGNOSIS — M25662 Stiffness of left knee, not elsewhere classified: Secondary | ICD-10-CM | POA: Insufficient documentation

## 2023-03-17 DIAGNOSIS — R293 Abnormal posture: Secondary | ICD-10-CM | POA: Diagnosis not present

## 2023-03-17 DIAGNOSIS — M6281 Muscle weakness (generalized): Secondary | ICD-10-CM | POA: Diagnosis not present

## 2023-03-17 DIAGNOSIS — M25562 Pain in left knee: Secondary | ICD-10-CM | POA: Diagnosis not present

## 2023-03-17 NOTE — Therapy (Signed)
 OUTPATIENT PHYSICAL THERAPY LOWER EXTREMITY EVALUATION   Patient Name: Sheri Burns MRN: [id number] DOB:01/15/1956, 68 y.o., female Today's Date: 03/17/2023  END OF SESSION:  PT End of Session - 03/17/23 1937     Visit Number 1    Number of Visits 19    Date for PT Re-Evaluation 06/15/23    Authorization Type BC[employer] MCR    PT Start Time 1445    PT Stop Time 1519    PT Time Calculation (min) 34 min    Activity Tolerance Patient tolerated treatment well    Behavior During Therapy WFL for tasks assessed/performed              Past Medical History:  Diagnosis Date   Anemia    Bulging lumbar disc 1980   Chronic pansinusitis    Colon polyp    GERD (gastroesophageal reflux disease)    Gynecological examination    sees Dr. Montie Romine    Hypertension    Multiple sclerosis (HCC) since 1992   Dr. Charlie Crete in [facility]   Multiple thyroid nodules 1995   per pt. resolved on its own follwed by endo then released   Neurogenic bladder disorder 12/2009   w/ [facility] dx. Dr. McDiarmid   Neuropathy    Osteoarthritis    Osteoporosis    last DEXA 2018   Overactive bladder    sees Dr. Glendia McDiarmid    Pancreatitis 1977   Ulcerative proctitis [facility])    Vision abnormalities    Past Surgical History:  Procedure Laterality Date   APPENDECTOMY     breast ruptured  left   ductal cyst   CARPAL TUNNEL RELEASE Right    CHOLECYSTECTOMY     COLONOSCOPY  01/10/2009   repeat in 5 yrs Dr. Jerrell Sol   CYSTOSCOPY WITH BIOPSY N/A 01/06/2022   Procedure: CYSTOSCOPY WITH BLADDER/URETHRAL BIOPSY;  Surgeon: Alvaro Hummer, MD;  Location: [facility];  Service: Urology;  Laterality: N/A;  45 MINS   DILATION AND CURETTAGE OF UTERUS     LAPAROSCOPY     x 3   left knee surgery  1978   to remove bone tumor     NASAL SINUS SURGERY Bilateral 01/13/2015   Procedure: ENDOSCOPIC SINUS SURGERY;  Surgeon: Ida Loader, MD;  Location: [facility];  Service:  ENT;  Laterality: Bilateral;   NASAL SINUS SURGERY  05/2017   NASAL TURBINATE REDUCTION     pubo vaginal sling  10/2003   SPINAL FUSION     T7-S1 2023   TONSILLECTOMY     TOTAL KNEE ARTHROPLASTY Left 03/14/2023   Procedure: TOTAL KNEE ARTHROPLASTY;  Surgeon: Melodi Lerner, MD;  Location: [facility];  Service: Orthopedics;  Laterality: Left;   vaginal cystocele repair     anterior   VAGINAL HYSTERECTOMY  1995   AUB & Endometriosis   widom teeth removal     Patient Active Problem List   Diagnosis Date Noted   Osteoarthritis of left knee 03/14/2023   Vertigo 08/05/2021   Chronic prescription opiate use 12/14/2018   Carpal tunnel syndrome, bilateral 09/13/2017   Sacroiliac joint pain 09/13/2017   Urinary urgency 06/22/2017   Sciatica, right side 06/17/2016   Dysesthesia 10/30/2015   Right carpal tunnel syndrome 10/30/2015   Sciatica of left side 07/16/2015   Chronic ethmoidal sinusitis 06/26/2015   High risk medication use 07/08/2014   Chronic fatigue 04/02/2014   Dysphagia 04/02/2014   Ataxic gait 04/02/2014   Vitamin  D deficiency 06/08/2012   OVERACTIVE BLADDER 08/01/2009   Osteoporosis 08/01/2009   Depression with anxiety 07/04/2007   MULTIPLE SCLEROSIS 07/04/2007   OA (osteoarthritis) of knee 07/04/2007   Headache 07/04/2007   History of colonic polyps 07/04/2007    PCP: Dwight Trula SQUIBB, MD   REFERRING PROVIDER:  Melodi Lerner, MD      REFERRING DIAG: Z51.89 (ICD-10-CM) - Encounter for other specified aftercare     THERAPY DIAG:  Pain in joint of left knee - Plan: PT plan of care cert/re-cert  Muscle weakness (generalized) - Plan: PT plan of care cert/re-cert  Difficulty walking - Plan: PT plan of care cert/re-cert  Rationale for Evaluation and Treatment: Rehabilitation  ONSET DATE: 03/13/2022 DOS  L TKA  SUBJECTIVE:   SUBJECTIVE STATEMENT:  Pt states the knee pain from surgery was the worst yesterday. She has been taking her medications as prescribed.  Denies s/s of infection and DVT. Pt has shower chair, potty chair, and walker. Walking around the house 4x every 2 hours. Pt does have swelling but has been icing. No new NT outside of baseline neuropathy. Pt has a large list of exercises from inpatient therapist - heel slide, hip ABD, LAQ, ankle pumps  PERTINENT HISTORY: 2/23 fusion of T7-S1 for scoliosis; L knee surgery medial condyle excision for osteochondritis desiccans; L meniscus tear; MS relapsing remitting  PAIN:  Are you having pain? Yes: NPRS scale: 10/10 Pain location: L ant and post knee Pain description: ache, dull, throb, cramping  Aggravating factors: movement, WB, flexion and ext Relieving factors: resting, sitting   PRECAUTIONS: L knee  WEIGHT BEARING RESTRICTIONS: WBAT  FALLS:  Has patient fallen in last 6 months? No  LIVING ENVIRONMENT: Lives with: lives alone Lives in: House/apartment Stairs: No Has following equipment at home: [facility] and walker, does not use either   OCCUPATION: ICU nurse, retired   PLOF: Independent with basic ADLs  PATIENT GOALS: return to normal walking  Next visit: 2 wk post op appt  OBJECTIVE:   DIAGNOSTIC FINDINGS: N/A post op  PATIENT SURVEYS:   FOTO 18 47 @ DC 19 visits 19 MCII    COGNITION: Overall cognitive status: Within functional limits for tasks assessed     SENSATION: Light touch impaired at anterior knee and lower leg on L  EDEMA:  N/A   PALPATION: TTP of anterior and posterior knee  Incision site well appearing with Aquacell in place; no erythema or excessive saturation of the bandage  LOWER EXTREMITY ROM:  Active ROM Right eval Left eval  Knee flexion 118 89  Knee extension -3 -29   (Blank rows = not tested)  LOWER EXTREMITY MMT: not indicated 2/2 surgical precautions  FUNCTIONAL TESTS:  Transfers: STS requires UE, L knee extended coming into standing  Requires UE and opp LE assisted for moving surgical limb  GAIT: Distance walked:  67ft Assistive device utilized: None Level of assistance: Mod I with 2W walker Comments: L flexed knee, antalgic, decreased L stance time, lack of heel strike and toe off   TODAY'S TREATMENT:  DATE: 1/9  In addition to [facility] HEP  Exercises - Long Sitting Quad Set with Towel Roll Under Heel  - 3-4 x daily - 7 x weekly - 3 sets - 10 reps - Seated Passive Knee Extension  - 3-4 x daily - 7 x weekly - 1 sets - 2 reps - 2 hold - Side to Side Weight Shift with Counter Support  - 2 x daily - 7 x weekly - 2 sets - 10 reps - 5 hold - Seated Knee Flexion AAROM  - 2 x daily - 7 x weekly - 2 sets - 10 reps - 5 hold   PATIENT EDUCATION:  Education details: surgical precautions, diagnosis, prognosis, anatomy, exercise progression, DOMS expectations, muscle firing,  envelope of function, HEP, POC Person educated: Patient Education method: Explanation, Demonstration, Tactile cues, Verbal cues, and Handouts Education comprehension: verbalized understanding, returned demonstration, verbal cues required, and tactile cues required  HOME EXERCISE PROGRAM:  Access Code: [email or web address] URL: [email or web address] Date: 04/26/2022 Prepared by: Dale Call  ASSESSMENT:  CLINICAL IMPRESSION: Patient is a 68 y.o. female who was seen today for physical therapy evaluation and treatment for c/c of L TKA. Pt's pain is highly sensitive and irritable with movement. Pt has very good knee flexion for 3 days post op but is more limited with extension as expected. Pt has been positioning to avoid knee flexion contracture but advised to actively perform heel propped quad sets and passive knee ext sitting more frequently during the day. Pt has been active and been performing hourly movement and taking blood thinners for clot prevention. No signs of infection. Plan to continue with L knee ROM  and quad activation at future sessions. Consider repeat of heel propped quad sets, standing TKE, and low grade joint mobs at next. Pt would benefit from continued skilled therapy in order to reach goals and maximize functional L LE strength and ROM for return to normal ADL and community ambulation.   OBJECTIVE IMPAIRMENTS decreased mobility, difficulty walking, decreased ROM, decreased strength, increased muscle spasms, improper body mechanics, postural dysfunction, and pain.    ACTIVITY LIMITATIONS lifting, sitting, standing, squatting, stairs, transfers, locomotion level   PARTICIPATION LIMITATIONS: cleaning, laundry, driving, shopping, community activity, occupation, yard work, and exercise   PERSONAL FACTORS Age, Fitness, Time since onset of injury/illness/exacerbation, and 2+ comorbidity:    are also affecting patient's functional outcome.    REHAB POTENTIAL: Good   CLINICAL DECISION MAKING: unstable/complicated   EVALUATION COMPLEXITY: Moderate     GOALS:     SHORT TERM GOALS: Target date: 04/28/2023    Pt will become independent with HEP in order to demonstrate synthesis of PT education..   Goal status: INITIAL   2.  Pt will score at least 19 pt increase on FOTO to demonstrate functional improvement in MCII and pt perceived function.      Goal status: INITIAL   3.  Pt will be able to demonstrate 0 deg of knee ext and 120 of knee flexion in order to demonstrate functional improvement in LE function for self-care and house hold duties.     Goal status: INITIAL  .  Pt will be able to demonstrate ability to walk independently for household distances in order to demonstrate functional improvement in LE function for self-care and house hold duties.     Goal status: INITIAL     LONG TERM GOALS: Target date: 06/09/2023     Pt  will become independent with final HEP in order to demonstrate synthesis  of PT education.   Goal status: INITIAL   2.  Pt will score >/= 47 on  FOTO to demonstrate improvement in perceived L knee function.    Goal status: INITIAL   3.  Pt will be able to demonstrate reciprocal stair management with single UE assist in order to demonstrate functional improvement in LE function for self-care and house hold mobility.   Goal status: INITIAL   4.  Pt will be able to demonstrate/report ability to walk >20 mins without pain in order to demonstrate functional improvement and tolerance to exercise and community mobility.    Goal status: INITIAL       PLAN: PT FREQUENCY: 1-2x/week   PT DURATION: 12 weeks (likely DC in 8)   PLANNED INTERVENTIONS: Therapeutic exercises, Therapeutic activity, Neuromuscular re-education, Balance training, Gait training, Patient/Family education, Self Care, and Joint mobilization   PLAN FOR NEXT SESSION: continue per TKA protocol focus on promoting knee extension and working towards a SLR  Dale Call, PT 03/17/2023, 7:44 PM

## 2023-03-19 ENCOUNTER — Ambulatory Visit (HOSPITAL_BASED_OUTPATIENT_CLINIC_OR_DEPARTMENT_OTHER): Payer: Medicare Other | Admitting: Physical Therapy

## 2023-03-23 NOTE — Discharge Summary (Signed)
 Patient ID: Sheri Burns MRN: [id number] DOB/AGE: 1955-10-26 68 y.o.  Admit date: 03/14/2023 Discharge date: 03/15/2023  Admission Diagnoses:  Principal Problem:   OA (osteoarthritis) of knee Active Problems:   Osteoarthritis of left knee   Discharge Diagnoses:  Same  Past Medical History:  Diagnosis Date   Anemia    Bulging lumbar disc 1980   Chronic pansinusitis    Colon polyp    GERD (gastroesophageal reflux disease)    Gynecological examination    sees Dr. Montie Romine    Hypertension    Multiple sclerosis [facility]) since 1992   Dr. Charlie Crete in [facility]   Multiple thyroid nodules 1995   per pt. resolved on its own follwed by endo then released   Neurogenic bladder disorder 12/2009   w/ [facility] stress [facility]. dx. Dr. McDiarmid   Neuropathy    Osteoarthritis    Osteoporosis    last DEXA 2018   Overactive bladder    sees Dr. Glendia McDiarmid    Pancreatitis 1977   Ulcerative proctitis [facility])    Vision abnormalities     Surgeries: Procedure(s): TOTAL KNEE ARTHROPLASTY on 03/14/2023   Consultants:   Discharged Condition: Improved  Hospital Course: Sheri Burns is an 68 y.o. female who was admitted 03/14/2023 for operative treatment ofOA (osteoarthritis) of knee. Patient has severe unremitting pain that affects sleep, daily activities, and work/hobbies. After pre-op clearance the patient was taken to the operating room on 03/14/2023 and underwent  Procedure(s): TOTAL KNEE ARTHROPLASTY.    Patient was given perioperative antibiotics:  Anti-infectives (From admission, onward)    Start     Dose/Rate Route Frequency Ordered Stop   03/14/23 1700  ceFAZolin  (ANCEF ) IVPB 2g/100 mL premix        2 g 200 mL/hr over 30 Minutes Intravenous Every 6 hours 03/14/23 1338 03/14/23 2211   03/14/23 0830  ceFAZolin  (ANCEF ) IVPB 2g/100 mL premix        2 g 200 mL/hr over 30 Minutes Intravenous On call to O.R. 03/14/23 0819 03/14/23 1104        Patient was given  sequential compression devices, early ambulation, and chemoprophylaxis to prevent DVT.  Patient benefited maximally from hospital stay and there were no complications.    Recent vital signs: No data found.   Recent laboratory studies: No results for input(s): WBC, HGB, HCT, PLT, NA, K, CL, CO2, BUN, CREATININE, GLUCOSE, INR, CALCIUM in the last 72 hours.  Invalid input(s): PT, 2   Discharge Medications:   Allergies as of 03/15/2023       Reactions   Amoxicillin Hives, Itching   Codeine Nausea And Vomiting   vomiting   Demerol [meperidine] Nausea And Vomiting   Egg-derived Products Nausea And Vomiting   Ivp Dye [iodinated Contrast Media] Hives   Penicillins Hives   Tolerated Cephalosporin Date: 03/15/23.     Risedronate  Other (See Comments)   Gi upset   Stadol [butorphanol] Other (See Comments)   Per patient psychotic event   Sulfonamide Derivatives Hives   Clindamycin /lincomycin Rash   Dog Epithelium (canis Lupus Familiaris) Other (See Comments)   Sneezing, cough Cat/ sinus infection and congestion   Gadolinium Derivatives Hives        Medication List     STOP taking these medications    CALCIUM 600 + D PO   multivitamin with minerals tablet       TAKE these medications    acetaminophen  500 MG tablet Commonly known as: TYLENOL  Take  500 mg by mouth 4 (four) times daily. (0800, 1200, 1600 & 2000)   amLODipine -valsartan  5-160 MG tablet Commonly known as: EXFORGE  Take 1 tablet by mouth at bedtime.   amphetamine -dextroamphetamine  20 MG tablet Commonly known as: ADDERALL Take 1 tablet (20 mg total) by mouth 2 (two) times daily.   clonazePAM  1 MG tablet Commonly known as: KLONOPIN  TAKE 1 TABLET BY MOUTH 3 TIMES DAILY AS NEEDED What changed: when to take this   Comirnaty  syringe Generic drug: COVID-19 mRNA vaccine (Pfizer) Inject into the muscle.   denosumab  60 MG/ML Sosy injection Commonly known as: PROLIA  Inject 60 mg  into the skin every 6 (six) months.   DULoxetine  60 MG capsule Commonly known as: CYMBALTA  TAKE 1 CAPSULE BY MOUTH EVERY DAY   finasteride  1 MG tablet Commonly known as: PROPECIA  Take 1 mg by mouth daily at 12 noon. (1200)   Flonase  Allergy Relief 50 MCG/ACT nasal spray Generic drug: fluticasone  Place 2 sprays into both nostrils at bedtime.   gabapentin  600 MG tablet Commonly known as: NEURONTIN  Take 1 tablet (600 mg total) by mouth 4 (four) times daily.   leflunomide  20 MG tablet Commonly known as: ARAVA  TAKE 1 TABLET BY MOUTH EVERY DAY   methocarbamol  500 MG tablet Commonly known as: ROBAXIN  Take 1 tablet (500 mg total) by mouth every 6 (six) hours as needed for muscle spasms.   ondansetron  4 MG tablet Commonly known as: ZOFRAN  Take 1 tablet (4 mg total) by mouth every 6 (six) hours as needed for nausea.   oxybutynin  10 MG 24 hr tablet Commonly known as: DITROPAN -XL TAKE 1 TABLET BY MOUTH EVERY DAY What changed:  when to take this additional instructions   oxyCODONE  5 MG immediate release tablet Commonly known as: Oxy IR/ROXICODONE  Take 1-2 tablets (5-10 mg total) by mouth every 6 (six) hours as needed for severe pain (pain score 7-10).   Protonix  20 MG tablet Generic drug: pantoprazole  Take 20 mg by mouth at bedtime.   rivaroxaban  10 MG Tabs tablet Commonly known as: XARELTO  Take 1 tablet (10 mg total) by mouth daily with breakfast for 20 days.   traMADol  50 MG tablet Commonly known as: ULTRAM  TAKE 1 TABLET BY MOUTH EVERY 6 HOURS AS NEEDED (8am, 12noon, AT 4PM, & 8pm) What changed:  how much to take how to take this when to take this additional instructions               Discharge Care Instructions  (From admission, onward)           Start     Ordered   03/15/23 0000  Weight bearing as tolerated        03/15/23 0835   03/15/23 0000  Change dressing       Comments: You may remove the bulky bandage (ACE wrap and gauze) two days after  surgery. You will have an adhesive waterproof bandage underneath. Leave this in place until your first follow-up appointment.   03/15/23 0835            Diagnostic Studies: No results found.  Disposition: Discharge disposition: 01-Home or Self Care       Discharge Instructions     Call MD / Call 911   Complete by: As directed    If you experience chest pain or shortness of breath, CALL 911 and be transported to the hospital emergency room.  If you develope a fever above 101 F, pus (white drainage) or increased drainage or redness at  the wound, or calf pain, call your surgeon's office.   Change dressing   Complete by: As directed    You may remove the bulky bandage (ACE wrap and gauze) two days after surgery. You will have an adhesive waterproof bandage underneath. Leave this in place until your first follow-up appointment.   Constipation Prevention   Complete by: As directed    Drink plenty of fluids.  Prune juice may be helpful.  You may use a stool softener, such as Colace (over the counter) 100 mg twice a day.  Use MiraLax  (over the counter) for constipation as needed.   Diet - low sodium heart healthy   Complete by: As directed    Do not put a pillow under the knee. Place it under the heel.   Complete by: As directed    Driving restrictions   Complete by: As directed    No driving for two weeks   Post-operative opioid taper instructions:   Complete by: As directed    POST-OPERATIVE OPIOID TAPER INSTRUCTIONS: It is important to wean off of your opioid medication as soon as possible. If you do not need pain medication after your surgery it is ok to stop day one. Opioids include: Codeine, Hydrocodone (Norco, Vicodin), Oxycodone (Percocet, oxycontin ) and hydromorphone  amongst others.  Long term and even short term use of opiods can cause: Increased pain response Dependence Constipation Depression Respiratory depression And more.  Withdrawal symptoms can include Flu  like symptoms Nausea, vomiting And more Techniques to manage these symptoms Hydrate well Eat regular healthy meals Stay active Use relaxation techniques(deep breathing, meditating, yoga) Do Not substitute Alcohol to help with tapering If you have been on opioids for less than two weeks and do not have pain than it is ok to stop all together.  Plan to wean off of opioids This plan should start within one week post op of your joint replacement. Maintain the same interval or time between taking each dose and first decrease the dose.  Cut the total daily intake of opioids by one tablet each day Next start to increase the time between doses. The last dose that should be eliminated is the evening dose.      TED hose   Complete by: As directed    Use stockings (TED hose) for three weeks on both leg(s).  You may remove them at night for sleeping.   Weight bearing as tolerated   Complete by: As directed         Follow-up Information     Aluisio, Dempsey, MD Follow up in 2 week(s).   Specialty: Orthopedic Surgery Contact information: [address] [place] [address] [phone]                  Signed: Roxie Mess 03/23/2023, 11:19 AM

## 2023-03-24 ENCOUNTER — Ambulatory Visit (HOSPITAL_BASED_OUTPATIENT_CLINIC_OR_DEPARTMENT_OTHER): Payer: Medicare Other

## 2023-03-24 ENCOUNTER — Encounter (HOSPITAL_BASED_OUTPATIENT_CLINIC_OR_DEPARTMENT_OTHER): Payer: Self-pay

## 2023-03-24 ENCOUNTER — Other Ambulatory Visit: Payer: Self-pay | Admitting: Neurology

## 2023-03-24 DIAGNOSIS — M25562 Pain in left knee: Secondary | ICD-10-CM

## 2023-03-24 DIAGNOSIS — G35 Multiple sclerosis: Secondary | ICD-10-CM

## 2023-03-24 DIAGNOSIS — M25662 Stiffness of left knee, not elsewhere classified: Secondary | ICD-10-CM | POA: Diagnosis not present

## 2023-03-24 DIAGNOSIS — R293 Abnormal posture: Secondary | ICD-10-CM

## 2023-03-24 DIAGNOSIS — R262 Difficulty in walking, not elsewhere classified: Secondary | ICD-10-CM | POA: Diagnosis not present

## 2023-03-24 DIAGNOSIS — M6281 Muscle weakness (generalized): Secondary | ICD-10-CM | POA: Diagnosis not present

## 2023-03-24 NOTE — Therapy (Signed)
OUTPATIENT PHYSICAL THERAPY LOWER EXTREMITY TREATMENT   Patient Name: Sheri Burns MRN: [id number] DOB:May 24, 1955, 68 y.o., female Today's Date: 03/24/2023  END OF SESSION:  PT End of Session - 03/24/23 1555     Visit Number 2    Number of Visits 19    Date for PT Re-Evaluation 06/15/23    Authorization Type BCBS MCR    PT Start Time 1517    PT Stop Time 1557    PT Time Calculation (min) 40 min    Activity Tolerance Patient tolerated treatment well    Behavior During Therapy WFL for tasks assessed/performed               Past Medical History:  Diagnosis Date   Anemia    Bulging lumbar disc 1980   Chronic pansinusitis    Colon polyp    GERD (gastroesophageal reflux disease)    Gynecological examination    sees Dr. Aram Beecham Romine    Hypertension    Multiple sclerosis (HCC) since 1992   Dr. Despina Arias in [facility]   Multiple thyroid nodules 1995   per pt. resolved on its own follwed by endo then released   Neurogenic bladder disorder 12/2009   w/ [facility] dx. Dr. McDiarmid   Neuropathy    Osteoarthritis    Osteoporosis    last DEXA 2018   Overactive bladder    sees Dr. Lorin Picket McDiarmid    Pancreatitis 1977   Ulcerative proctitis [facility])    Vision abnormalities    Past Surgical History:  Procedure Laterality Date   APPENDECTOMY     breast ruptured  left   ductal cyst   CARPAL TUNNEL RELEASE Right    CHOLECYSTECTOMY     COLONOSCOPY  01/10/2009   repeat in 5 yrs Dr. Charlott Rakes   CYSTOSCOPY WITH BIOPSY N/A 01/06/2022   Procedure: CYSTOSCOPY WITH BLADDER/URETHRAL BIOPSY;  Surgeon: Sebastian Ache, MD;  Location: [facility];  Service: Urology;  Laterality: N/A;  45 MINS   DILATION AND CURETTAGE OF UTERUS     LAPAROSCOPY     x 3   left knee surgery  1978   to remove bone tumor     NASAL SINUS SURGERY Bilateral 01/13/2015   Procedure: ENDOSCOPIC SINUS SURGERY;  Surgeon: Serena Colonel, MD;  Location: [facility];  Service:  ENT;  Laterality: Bilateral;   NASAL SINUS SURGERY  05/2017   NASAL TURBINATE REDUCTION     pubo vaginal sling  10/2003   SPINAL FUSION     T7-S1 2023   TONSILLECTOMY     TOTAL KNEE ARTHROPLASTY Left 03/14/2023   Procedure: TOTAL KNEE ARTHROPLASTY;  Surgeon: Ollen Gross, MD;  Location: [facility];  Service: Orthopedics;  Laterality: Left;   vaginal cystocele repair     anterior   VAGINAL HYSTERECTOMY  1995   AUB & Endometriosis   widom teeth removal     Patient Active Problem List   Diagnosis Date Noted   Osteoarthritis of left knee 03/14/2023   Vertigo 08/05/2021   Chronic prescription opiate use 12/14/2018   Carpal tunnel syndrome, bilateral 09/13/2017   Sacroiliac joint pain 09/13/2017   Urinary urgency 06/22/2017   Sciatica, right side 06/17/2016   Dysesthesia 10/30/2015   Right carpal tunnel syndrome 10/30/2015   Sciatica of left side 07/16/2015   Chronic ethmoidal sinusitis 06/26/2015   High risk medication use 07/08/2014   Chronic fatigue 04/02/2014   Dysphagia 04/02/2014   Ataxic gait 04/02/2014  Vitamin D deficiency 06/08/2012   OVERACTIVE BLADDER 08/01/2009   Osteoporosis 08/01/2009   Depression with anxiety 07/04/2007   MULTIPLE SCLEROSIS 07/04/2007   OA (osteoarthritis) of knee 07/04/2007   Headache 07/04/2007   History of colonic polyps 07/04/2007    PCP: Thana Ates, MD   REFERRING PROVIDER:  Ollen Gross, MD      REFERRING DIAG: Z51.89 (ICD-10-CM) - Encounter for other specified aftercare     THERAPY DIAG:  Pain in joint of left knee  Muscle weakness (generalized)  Difficulty walking  Posture abnormality  Stiffness of left knee, not elsewhere classified  Rationale for Evaluation and Treatment: Rehabilitation  ONSET DATE: 03/13/2022 DOS  L TKA  SUBJECTIVE:   SUBJECTIVE STATEMENT:  Pt reports pain has been better overall. Does continue to take rx pain medication. Able to sleep fine laying on side with a pillow between her legs.  Arrives with FWW.  PERTINENT HISTORY: 2/23 fusion of T7-S1 for scoliosis; L knee surgery medial condyle excision for osteochondritis desiccans; L meniscus tear; MS relapsing remitting  PAIN:  Are you having pain? Yes: NPRS scale: 7/10 Pain location: L ant and post knee Pain description: ache, dull, throb, cramping  Aggravating factors: movement, WB, flexion and ext Relieving factors: resting, sitting   PRECAUTIONS: L knee  WEIGHT BEARING RESTRICTIONS: WBAT  FALLS:  Has patient fallen in last 6 months? No  LIVING ENVIRONMENT: Lives with: lives alone Lives in: House/apartment Stairs: No Has following equipment at home: [facility] and walker, does not use either   OCCUPATION: ICU nurse, retired   PLOF: Independent with basic ADLs  PATIENT GOALS: return to normal walking  Next visit: 2 wk post op appt  OBJECTIVE:   DIAGNOSTIC FINDINGS: N/A post op  PATIENT SURVEYS:   FOTO 18 47 @ DC 19 visits 19 MCII    COGNITION: Overall cognitive status: Within functional limits for tasks assessed     SENSATION: Light touch impaired at anterior knee and lower leg on L  EDEMA:  N/A   PALPATION: TTP of anterior and posterior knee  Incision site well appearing with Aquacell in place; no erythema or excessive saturation of the bandage  LOWER EXTREMITY ROM:  Active ROM Right eval Left eval  Knee flexion 118 89  Knee extension -3 -29   (Blank rows = not tested)  LOWER EXTREMITY MMT: not indicated 2/2 surgical precautions  FUNCTIONAL TESTS:  Transfers: STS requires UE, L knee extended coming into standing  Requires UE and opp LE assisted for moving surgical limb  GAIT: Distance walked: 66ft Assistive device utilized: None Level of assistance: Mod I with 2W walker Comments: L flexed knee, antalgic, decreased L stance time, lack of heel strike and toe off   TODAY'S TREATMENT:                                                                                                                               DATE:  1/16:  PROM flexion and extension SAQ x10 LAQ 2x10 Gait in hall with FWW x38ft Standing heel raise 2x10 Partial squats x10     1/9  In addition to [place] HEP  Exercises - Long Sitting Quad Set with Towel Roll Under Heel  - 3-4 x daily - 7 x weekly - 3 sets - 10 reps - Seated Passive Knee Extension  - 3-4 x daily - 7 x weekly - 1 sets - 2 reps - 2 hold - Side to Side Weight Shift with Counter Support  - 2 x daily - 7 x weekly - 2 sets - 10 reps - 5 hold - Seated Knee Flexion AAROM  - 2 x daily - 7 x weekly - 2 sets - 10 reps - 5 hold   PATIENT EDUCATION:  Education details: surgical precautions, diagnosis, prognosis, anatomy, exercise progression, DOMS expectations, muscle firing,  envelope of function, HEP, POC Person educated: Patient Education method: Explanation, Demonstration, Tactile cues, Verbal cues, and Handouts Education comprehension: verbalized understanding, returned demonstration, verbal cues required, and tactile cues required  HOME EXERCISE PROGRAM:  Access Code: [email or web address] URL: [email or web address] Date: 04/26/2022 Prepared by: Zebedee Iba  ASSESSMENT:  CLINICAL IMPRESSION: Minimal swellng observed in L knee. Pt able to achieve ~90deg flexion visually with PROM. She is stiff into knee extension. Unable to really push this today due to increased pain level with this. She demonstrates overall good heel contact and toe off with gait pattern using FWW. Pain with toe raises but able to complete heel raises. Cuing required with proper mini squat technique. May try recumbent bike next visit for ROM. Pt to continue using ice to aid with decreasing pain and swelling.  Eval: Patient is a 68 y.o. female who was seen today for physical therapy evaluation and treatment for c/c of L TKA. Pt's pain is highly sensitive and irritable with movement. Pt has very good knee flexion for 3 days post op but is more limited with  extension as expected. Pt has been positioning to avoid knee flexion contracture but advised to actively perform heel propped quad sets and passive knee ext sitting more frequently during the day. Pt has been active and been performing hourly movement and taking blood thinners for clot prevention. No signs of infection. Plan to continue with L knee ROM and quad activation at future sessions. Consider repeat of heel propped quad sets, standing TKE, and low grade joint mobs at next. Pt would benefit from continued skilled therapy in order to reach goals and maximize functional L LE strength and ROM for return to normal ADL and community ambulation.   OBJECTIVE IMPAIRMENTS decreased mobility, difficulty walking, decreased ROM, decreased strength, increased muscle spasms, improper body mechanics, postural dysfunction, and pain.    ACTIVITY LIMITATIONS lifting, sitting, standing, squatting, stairs, transfers, locomotion level   PARTICIPATION LIMITATIONS: cleaning, laundry, driving, shopping, community activity, occupation, yard work, and exercise   PERSONAL FACTORS Age, Fitness, Time since onset of injury/illness/exacerbation, and 2+ comorbidity:    are also affecting patient's functional outcome.    REHAB POTENTIAL: Good   CLINICAL DECISION MAKING: unstable/complicated   EVALUATION COMPLEXITY: Moderate     GOALS:     SHORT TERM GOALS: Target date: 04/28/2023    Pt will become independent with HEP in order to demonstrate synthesis of PT education..   Goal status: INITIAL   2.  Pt will score at least 19 pt increase on FOTO to demonstrate functional improvement in MCII and pt perceived function.  Goal status: INITIAL   3.  Pt will be able to demonstrate 0 deg of knee ext and 120 of knee flexion in order to demonstrate functional improvement in LE function for self-care and house hold duties.     Goal status: INITIAL  .  Pt will be able to demonstrate ability to walk independently  for household distances in order to demonstrate functional improvement in LE function for self-care and house hold duties.     Goal status: INITIAL     LONG TERM GOALS: Target date: 06/09/2023     Pt  will become independent with final HEP in order to demonstrate synthesis of PT education.   Goal status: INITIAL   2.  Pt will score >/= 47 on FOTO to demonstrate improvement in perceived L knee function.    Goal status: INITIAL   3.  Pt will be able to demonstrate reciprocal stair management with single UE assist in order to demonstrate functional improvement in LE function for self-care and house hold mobility.   Goal status: INITIAL   4.  Pt will be able to demonstrate/report ability to walk >20 mins without pain in order to demonstrate functional improvement and tolerance to exercise and community mobility.    Goal status: INITIAL       PLAN: PT FREQUENCY: 1-2x/week   PT DURATION: 12 weeks (likely DC in 8)   PLANNED INTERVENTIONS: Therapeutic exercises, Therapeutic activity, Neuromuscular re-education, Balance training, Gait training, Patient/Family education, Self Care, and Joint mobilization   PLAN FOR NEXT SESSION: continue per TKA protocol focus on promoting knee extension and working towards a [employer], PTA 03/24/2023, 4:59 PM

## 2023-03-24 NOTE — Telephone Encounter (Signed)
Requested Prescriptions   Pending Prescriptions Disp Refills   amphetamine-dextroamphetamine (ADDERALL) 20 MG tablet [Pharmacy Med Name: dextroamphetamine-amphetamine 20 mg tablet] 60 tablet 0    Sig: TAKE 1 TABLET BY MOUTH 2 TIMES DAILY   leflunomide (ARAVA) 20 MG tablet [Pharmacy Med Name: leflunomide 20 mg tablet] 90 tablet 0    Sig: TAKE 1 TABLET BY MOUTH EVERY DAY   Last seen 02/23/23, next appt 09/14/23 Last office note stated: We will continue Arava as prescribed. She will continue gabapentin, tramadol, clonazepam, Adderall and oxybutynin as prescribed  Dispenses   Dispensed Days Supply Quantity Provider Pharmacy  dextroamphetamine-amphetamine 20 mg tablet 02/24/2023 30 60 each Sater, Pearletha Furl, MD [facility]  dextroamphetamine-amphetamine 20 mg tablet 01/27/2023 30 60 each Sater, Pearletha Furl, MD [facility]  dextroamphetamine-amphetamine 20 mg tablet 01/07/2023 30 60 each Sater, Pearletha Furl, MD [facility]  dextroamphetamine-amphetamine 20 mg tablet 12/15/2022 30 60 each Sater, Pearletha Furl, MD [facility]  dextroamphetamine-amphetamine 20 mg tablet 11/16/2022 30 60 each Sater, Pearletha Furl, MD [facility]  dextroamphetamine-amphetamine 20 mg tablet 10/14/2022 30 60 each Sater, Pearletha Furl, MD [facility]  dextroamphetamine-amphetamine 20 mg tablet 09/13/2022 30 60 each Sater, Pearletha Furl, MD [facility]  dextroamphetamine-amphetamine 20 mg tablet 08/11/2022 30 60 each Sater, Pearletha Furl, MD [facility]  dextroamphetamine-amphetamine 20 mg tablet 07/14/2022 30 60 each Shawnie Dapper, NP [facility]  dextroamphetamine-amphetamine 20 mg tablet 06/16/2022 30 60 each Shawnie Dapper, NP [facility]  dextroamphetamine-amphetamine 20 mg tablet 05/19/2022 30 60 each Shawnie Dapper, NP [facility]  dextroamphetamine-amphetamine 20 mg tablet 04/20/2022 30 60 each Shawnie Dapper, NP [facility]    Dispenses   Dispensed Days Supply Quantity Provider Pharmacy  leflunomide 20 mg tablet 02/24/2023 30 30 each Sater, Pearletha Furl, MD [facility]  leflunomide 20 mg tablet 01/27/2023 30 30 each Sater, Pearletha Furl, MD [facility]  leflunomide 20 mg tablet 12/30/2022 30 30 each Sater, Pearletha Furl, MD [facility]  leflunomide 20 mg tablet 12/02/2022 30 30 each Sater, Pearletha Furl, MD [facility]  leflunomide 20 mg tablet 11/04/2022 30 30 each Sater, Pearletha Furl, MD [facility]  leflunomide 20 mg tablet 10/06/2022 30 30 each Sater, Pearletha Furl, MD [facility]  leflunomide 20 mg tablet 09/02/2022 30 30 each Sater, Pearletha Furl, MD [facility]  leflunomide 20 mg tablet 08/05/2022 30 30 each Sater, Pearletha Furl, MD [facility]  leflunomide 20 mg tablet 07/08/2022 30 30 each Sater, Pearletha Furl, MD [facility]  leflunomide 20 mg tablet 05/11/2022 75 75 each Sater, Pearletha Furl, MD [facility]Marland KitchenMarland Kitchen

## 2023-03-26 ENCOUNTER — Ambulatory Visit (HOSPITAL_BASED_OUTPATIENT_CLINIC_OR_DEPARTMENT_OTHER): Payer: Medicare Other | Admitting: Physical Therapy

## 2023-03-26 ENCOUNTER — Encounter (HOSPITAL_BASED_OUTPATIENT_CLINIC_OR_DEPARTMENT_OTHER): Payer: Self-pay | Admitting: Physical Therapy

## 2023-03-26 DIAGNOSIS — M25562 Pain in left knee: Secondary | ICD-10-CM | POA: Diagnosis not present

## 2023-03-26 DIAGNOSIS — M6281 Muscle weakness (generalized): Secondary | ICD-10-CM

## 2023-03-26 DIAGNOSIS — M25662 Stiffness of left knee, not elsewhere classified: Secondary | ICD-10-CM | POA: Diagnosis not present

## 2023-03-26 DIAGNOSIS — R262 Difficulty in walking, not elsewhere classified: Secondary | ICD-10-CM | POA: Diagnosis not present

## 2023-03-26 DIAGNOSIS — R293 Abnormal posture: Secondary | ICD-10-CM | POA: Diagnosis not present

## 2023-03-26 NOTE — Therapy (Signed)
OUTPATIENT PHYSICAL THERAPY LOWER EXTREMITY TREATMENT   Patient Name: Sheri Burns MRN: [id number] DOB:1955/07/20, 68 y.o., female Today's Date: 03/26/2023  END OF SESSION:  PT End of Session - 03/26/23 1130     Visit Number 3    Number of Visits 19    Date for PT Re-Evaluation 06/15/23    Authorization Type BCBS MCR    PT Start Time 1130    Activity Tolerance Patient tolerated treatment well    Behavior During Therapy WFL for tasks assessed/performed               Past Medical History:  Diagnosis Date   Anemia    Bulging lumbar disc 1980   Chronic pansinusitis    Colon polyp    GERD (gastroesophageal reflux disease)    Gynecological examination    sees Dr. Aram Beecham Romine    Hypertension    Multiple sclerosis (HCC) since 1992   Dr. Despina Arias in [facility]   Multiple thyroid nodules 1995   per pt. resolved on its own follwed by endo then released   Neurogenic bladder disorder 12/2009   w/ urge [facility] stress inc. dx. Dr. McDiarmid   Neuropathy    Osteoarthritis    Osteoporosis    last DEXA 2018   Overactive bladder    sees Dr. Lorin Picket McDiarmid    Pancreatitis 1977   Ulcerative proctitis [facility])    Vision abnormalities    Past Surgical History:  Procedure Laterality Date   APPENDECTOMY     breast ruptured  left   ductal cyst   CARPAL TUNNEL RELEASE Right    CHOLECYSTECTOMY     COLONOSCOPY  01/10/2009   repeat in 5 yrs Dr. Charlott Rakes   CYSTOSCOPY WITH BIOPSY N/A 01/06/2022   Procedure: CYSTOSCOPY WITH BLADDER/URETHRAL BIOPSY;  Surgeon: Sebastian Ache, MD;  Location: [facility];  Service: Urology;  Laterality: N/A;  45 MINS   DILATION AND CURETTAGE OF UTERUS     LAPAROSCOPY     x 3   left knee surgery  1978   to remove bone tumor     NASAL SINUS SURGERY Bilateral 01/13/2015   Procedure: ENDOSCOPIC SINUS SURGERY;  Surgeon: Serena Colonel, MD;  Location: [facility];  Service: ENT;  Laterality: Bilateral;   NASAL SINUS SURGERY   05/2017   NASAL TURBINATE REDUCTION     pubo vaginal sling  10/2003   SPINAL FUSION     T7-S1 2023   TONSILLECTOMY     TOTAL KNEE ARTHROPLASTY Left 03/14/2023   Procedure: TOTAL KNEE ARTHROPLASTY;  Surgeon: Ollen Gross, MD;  Location: [facility];  Service: Orthopedics;  Laterality: Left;   vaginal cystocele repair     anterior   VAGINAL HYSTERECTOMY  1995   AUB & Endometriosis   widom teeth removal     Patient Active Problem List   Diagnosis Date Noted   Osteoarthritis of left knee 03/14/2023   Vertigo 08/05/2021   Chronic prescription opiate use 12/14/2018   Carpal tunnel syndrome, bilateral 09/13/2017   Sacroiliac joint pain 09/13/2017   Urinary urgency 06/22/2017   Sciatica, right side 06/17/2016   Dysesthesia 10/30/2015   Right carpal tunnel syndrome 10/30/2015   Sciatica of left side 07/16/2015   Chronic ethmoidal sinusitis 06/26/2015   High risk medication use 07/08/2014   Chronic fatigue 04/02/2014   Dysphagia 04/02/2014   Ataxic gait 04/02/2014   Vitamin D deficiency 06/08/2012   OVERACTIVE BLADDER 08/01/2009   Osteoporosis 08/01/2009   Depression  with anxiety 07/04/2007   MULTIPLE SCLEROSIS 07/04/2007   OA (osteoarthritis) of knee 07/04/2007   Headache 07/04/2007   History of colonic polyps 07/04/2007    PCP: Thana Ates, MD   REFERRING PROVIDER:  Ollen Gross, MD      REFERRING DIAG: Z51.89 (ICD-10-CM) - Encounter for other specified aftercare     THERAPY DIAG:  Pain in joint of left knee  Muscle weakness (generalized)  Difficulty walking  Rationale for Evaluation and Treatment: Rehabilitation  ONSET DATE: 03/13/2022 DOS  L TKA  SUBJECTIVE:   SUBJECTIVE STATEMENT:  It's doing okay.   PERTINENT HISTORY: 2/23 fusion of T7-S1 for scoliosis; L knee surgery medial condyle excision for osteochondritis desiccans; L meniscus tear; MS relapsing remitting  PAIN:  Are you having pain? Yes: NPRS scale: 7/10 Pain location: L ant and post  knee Pain description: mostly burning Aggravating factors: movement, WB, flexion and ext Relieving factors: resting, sitting   PRECAUTIONS: L knee  WEIGHT BEARING RESTRICTIONS: WBAT  FALLS:  Has patient fallen in last 6 months? No  LIVING ENVIRONMENT: Lives with: lives alone Lives in: House/apartment Stairs: No Has following equipment at home: [facility] and walker, does not use either   OCCUPATION: ICU nurse, retired   PLOF: Independent with basic ADLs  PATIENT GOALS: return to normal walking  Next visit: 2 wk post op appt  OBJECTIVE:   DIAGNOSTIC FINDINGS: N/A post op  PATIENT SURVEYS:   FOTO 18 47 @ DC 19 visits 19 MCII    COGNITION: Overall cognitive status: Within functional limits for tasks assessed     SENSATION: Light touch impaired at anterior knee and lower leg on L  EDEMA:  N/A   PALPATION: TTP of anterior and posterior knee  Incision site well appearing with Aquacell in place; no erythema or excessive saturation of the bandage  LOWER EXTREMITY ROM:  Active ROM Right eval Left eval Lt 1/18  Knee flexion 118 89 100  Knee extension -3 -29 -12   (Blank rows = not tested)  LOWER EXTREMITY MMT: not indicated 2/2 surgical precautions  FUNCTIONAL TESTS:  Transfers: STS requires UE, L knee extended coming into standing  Requires UE and opp LE assisted for moving surgical limb  GAIT: Distance walked: 76ft Assistive device utilized: None Level of assistance: Mod I with 2W walker Comments: L flexed knee, antalgic, decreased L stance time, lack of heel strike and toe off   TODAY'S TREATMENT:                                                                                                                              DATE: Treatment                            03/26/23:  Supine SAQ over towel roll Heel prop on towel roll- quad set to SLR Heel slide with strap- hold 3 deep breaths Standing glut set  Standing gastroc stretch Gait with glut set at  heel strike Recumb bike.    1/16: PROM flexion and extension SAQ x10 LAQ 2x10 Gait in hall with FWW x89ft Standing heel raise 2x10 Partial squats x10   PATIENT EDUCATION:  Education details: surgical precautions, diagnosis, prognosis, anatomy, exercise progression, DOMS expectations, muscle firing,  envelope of function, HEP, POC Person educated: Patient Education method: Explanation, Demonstration, Tactile cues, Verbal cues, and Handouts Education comprehension: verbalized understanding, returned demonstration, verbal cues required, and tactile cues required  HOME EXERCISE PROGRAM:  Access Code: [email or web address] URL: [email or web address]  ASSESSMENT:  CLINICAL IMPRESSION: ROM measures taken at the beginning of the session. Minimal swelling and excellent progression.   Eval: Patient is a 68 y.o. female who was seen today for physical therapy evaluation and treatment for c/c of L TKA. Pt's pain is highly sensitive and irritable with movement. Pt has very good knee flexion for 3 days post op but is more limited with extension as expected. Pt has been positioning to avoid knee flexion contracture but advised to actively perform heel propped quad sets and passive knee ext sitting more frequently during the day. Pt has been active and been performing hourly movement and taking blood thinners for clot prevention. No signs of infection. Plan to continue with L knee ROM and quad activation at future sessions. Consider repeat of heel propped quad sets, standing TKE, and low grade joint mobs at next. Pt would benefit from continued skilled therapy in order to reach goals and maximize functional L LE strength and ROM for return to normal ADL and community ambulation.   OBJECTIVE IMPAIRMENTS decreased mobility, difficulty walking, decreased ROM, decreased strength, increased muscle spasms, improper body mechanics, postural dysfunction, and pain.    ACTIVITY LIMITATIONS lifting, sitting,  standing, squatting, stairs, transfers, locomotion level   PARTICIPATION LIMITATIONS: cleaning, laundry, driving, shopping, community activity, occupation, yard work, and exercise   PERSONAL FACTORS Age, Fitness, Time since onset of injury/illness/exacerbation, and 2+ comorbidity:    are also affecting patient's functional outcome.    REHAB POTENTIAL: Good   CLINICAL DECISION MAKING: unstable/complicated   EVALUATION COMPLEXITY: Moderate     GOALS:     SHORT TERM GOALS: Target date: 04/28/2023    Pt will become independent with HEP in order to demonstrate synthesis of PT education..   Goal status: INITIAL   2.  Pt will score at least 19 pt increase on FOTO to demonstrate functional improvement in MCII and pt perceived function.      Goal status: INITIAL   3.  Pt will be able to demonstrate 0 deg of knee ext and 120 of knee flexion in order to demonstrate functional improvement in LE function for self-care and house hold duties.     Goal status: INITIAL  .  Pt will be able to demonstrate ability to walk independently for household distances in order to demonstrate functional improvement in LE function for self-care and house hold duties.     Goal status: INITIAL     LONG TERM GOALS: Target date: 06/09/2023     Pt  will become independent with final HEP in order to demonstrate synthesis of PT education.   Goal status: INITIAL   2.  Pt will score >/= 47 on FOTO to demonstrate improvement in perceived L knee function.    Goal status: INITIAL   3.  Pt will be able to demonstrate reciprocal stair management with single UE assist in order to demonstrate functional improvement in LE function  for self-care and house hold mobility.   Goal status: INITIAL   4.  Pt will be able to demonstrate/report ability to walk >20 mins without pain in order to demonstrate functional improvement and tolerance to exercise and community mobility.    Goal status: INITIAL        PLAN: PT FREQUENCY: 1-2x/week   PT DURATION: 12 weeks (likely DC in 8)   PLANNED INTERVENTIONS: Therapeutic exercises, Therapeutic activity, Neuromuscular re-education, Balance training, Gait training, Patient/Family education, Self Care, and Joint mobilization   PLAN FOR NEXT SESSION: continue per TKA protocol focus on promoting knee extension and working towards a [employer] C. Shaan Rhoads PT, DPT 03/26/23 11:30 AM

## 2023-03-30 ENCOUNTER — Encounter (HOSPITAL_BASED_OUTPATIENT_CLINIC_OR_DEPARTMENT_OTHER): Payer: Self-pay | Admitting: Physical Therapy

## 2023-03-30 ENCOUNTER — Ambulatory Visit (HOSPITAL_BASED_OUTPATIENT_CLINIC_OR_DEPARTMENT_OTHER): Payer: Medicare Other | Admitting: Physical Therapy

## 2023-03-30 DIAGNOSIS — M25562 Pain in left knee: Secondary | ICD-10-CM

## 2023-03-30 DIAGNOSIS — M6281 Muscle weakness (generalized): Secondary | ICD-10-CM | POA: Diagnosis not present

## 2023-03-30 DIAGNOSIS — R262 Difficulty in walking, not elsewhere classified: Secondary | ICD-10-CM | POA: Diagnosis not present

## 2023-03-30 DIAGNOSIS — M25662 Stiffness of left knee, not elsewhere classified: Secondary | ICD-10-CM | POA: Diagnosis not present

## 2023-03-30 DIAGNOSIS — R293 Abnormal posture: Secondary | ICD-10-CM | POA: Diagnosis not present

## 2023-03-30 NOTE — Therapy (Signed)
OUTPATIENT PHYSICAL THERAPY LOWER EXTREMITY TREATMENT   Patient Name: Georgeana Rindal Dougan MRN: [id number] DOB:September 26, 1955, 68 y.o., female Today's Date: 03/30/2023  END OF SESSION:  PT End of Session - 03/30/23 1401     Visit Number 4    Number of Visits 19    Date for PT Re-Evaluation 06/15/23    Authorization Type BCBS MCR    PT Start Time 1401    PT Stop Time 1439    PT Time Calculation (min) 38 min    Activity Tolerance Patient tolerated treatment well    Behavior During Therapy WFL for tasks assessed/performed                Past Medical History:  Diagnosis Date   Anemia    Bulging lumbar disc 1980   Chronic pansinusitis    Colon polyp    GERD (gastroesophageal reflux disease)    Gynecological examination    sees Dr. Aram Beecham Romine    Hypertension    Multiple sclerosis (HCC) since 1992   Dr. Despina Arias in [facility]   Multiple thyroid nodules 1995   per pt. resolved on its own follwed by endo then released   Neurogenic bladder disorder 12/2009   w/ urge & stress inc. dx. Dr. McDiarmid   Neuropathy    Osteoarthritis    Osteoporosis    last DEXA 2018   Overactive bladder    sees Dr. Lorin Picket McDiarmid    Pancreatitis 1977   Ulcerative proctitis [facility])    Vision abnormalities    Past Surgical History:  Procedure Laterality Date   APPENDECTOMY     breast ruptured  left   ductal cyst   CARPAL TUNNEL RELEASE Right    CHOLECYSTECTOMY     COLONOSCOPY  01/10/2009   repeat in 5 yrs Dr. Charlott Rakes   CYSTOSCOPY WITH BIOPSY N/A 01/06/2022   Procedure: CYSTOSCOPY WITH BLADDER/URETHRAL BIOPSY;  Surgeon: Sebastian Ache, MD;  Location: [facility];  Service: Urology;  Laterality: N/A;  45 MINS   DILATION AND CURETTAGE OF UTERUS     LAPAROSCOPY     x 3   left knee surgery  1978   to remove bone tumor     NASAL SINUS SURGERY Bilateral 01/13/2015   Procedure: ENDOSCOPIC SINUS SURGERY;  Surgeon: Serena Colonel, MD;  Location: [facility];   Service: ENT;  Laterality: Bilateral;   NASAL SINUS SURGERY  05/2017   NASAL TURBINATE REDUCTION     pubo vaginal sling  10/2003   SPINAL FUSION     T7-S1 2023   TONSILLECTOMY     TOTAL KNEE ARTHROPLASTY Left 03/14/2023   Procedure: TOTAL KNEE ARTHROPLASTY;  Surgeon: Ollen Gross, MD;  Location: [facility];  Service: Orthopedics;  Laterality: Left;   vaginal cystocele repair     anterior   VAGINAL HYSTERECTOMY  1995   AUB & Endometriosis   widom teeth removal     Patient Active Problem List   Diagnosis Date Noted   Osteoarthritis of left knee 03/14/2023   Vertigo 08/05/2021   Chronic prescription opiate use 12/14/2018   Carpal tunnel syndrome, bilateral 09/13/2017   Sacroiliac joint pain 09/13/2017   Urinary urgency 06/22/2017   Sciatica, right side 06/17/2016   Dysesthesia 10/30/2015   Right carpal tunnel syndrome 10/30/2015   Sciatica of left side 07/16/2015   Chronic ethmoidal sinusitis 06/26/2015   High risk medication use 07/08/2014   Chronic fatigue 04/02/2014   Dysphagia 04/02/2014   Ataxic gait 04/02/2014  Vitamin D deficiency 06/08/2012   OVERACTIVE BLADDER 08/01/2009   Osteoporosis 08/01/2009   Depression with anxiety 07/04/2007   MULTIPLE SCLEROSIS 07/04/2007   OA (osteoarthritis) of knee 07/04/2007   Headache 07/04/2007   History of colonic polyps 07/04/2007    PCP: Thana Ates, MD   REFERRING PROVIDER:  Ollen Gross, MD      REFERRING DIAG: Z51.89 (ICD-10-CM) - Encounter for other specified aftercare     THERAPY DIAG:  Pain in joint of left knee  Muscle weakness (generalized)  Difficulty walking  Rationale for Evaluation and Treatment: Rehabilitation  ONSET DATE: 03/13/2022 DOS  L TKA  SUBJECTIVE:   SUBJECTIVE STATEMENT:  Pt states that the knee is still painful. Pt saw PA and was told to continue. The knee feels like it is usually burning.   PERTINENT HISTORY: 2/23 fusion of T7-S1 for scoliosis; L knee surgery medial condyle  excision for osteochondritis desiccans; L meniscus tear; MS relapsing remitting  PAIN:  Are you having pain? Yes: NPRS scale: 7/10 Pain location: L ant and post knee Pain description: mostly burning Aggravating factors: movement, WB, flexion and ext Relieving factors: resting, sitting   PRECAUTIONS: L knee  WEIGHT BEARING RESTRICTIONS: WBAT  FALLS:  Has patient fallen in last 6 months? No  LIVING ENVIRONMENT: Lives with: lives alone Lives in: House/apartment Stairs: No Has following equipment at home: [facility] and walker, does not use either   OCCUPATION: ICU nurse, retired   PLOF: Independent with basic ADLs  PATIENT GOALS: return to normal walking  Next visit: 2 wk post op appt  OBJECTIVE:   DIAGNOSTIC FINDINGS: N/A post op  PATIENT SURVEYS:   FOTO 18 47 @ DC 19 visits 19 MCII    COGNITION: Overall cognitive status: Within functional limits for tasks assessed     SENSATION: Light touch impaired at anterior knee and lower leg on L  EDEMA:  N/A   PALPATION: TTP of anterior and posterior knee  Incision site well appearing with Aquacell in place; no erythema or excessive saturation of the bandage  LOWER EXTREMITY ROM:  Active ROM Right eval Left eval Lt 1/18  Knee flexion 118 89 100  Knee extension -3 -29 -12   (Blank rows = not tested)  LOWER EXTREMITY MMT: not indicated 2/2 surgical precautions  FUNCTIONAL TESTS:  Transfers: STS requires UE, L knee extended coming into standing  Requires UE and opp LE assisted for moving surgical limb  GAIT: Distance walked: 2ft Assistive device utilized: None Level of assistance: Mod I with 2W walker Comments: L flexed knee, antalgic, decreased L stance time, lack of heel strike and toe off   TODAY'S TREATMENT:                                                                                                                              DATE: 1/22  PROM ext and flexion to tolerance  Heel prop quad set  with calf stretch 2s  2x10 SLR 2x10 LAQ 3x10 Standing HR 20x  Gait with SPC 160ft     Treatment                            03/26/23:  Supine SAQ over towel roll Heel prop on towel roll- quad set to SLR Heel slide with strap- hold 3 deep breaths Standing glut set Standing gastroc stretch Gait with glut set at heel strike Recumb bike.    1/16: PROM flexion and extension SAQ x10 LAQ 2x10 Gait in hall with FWW x2ft Standing heel raise 2x10 Partial squats x10   PATIENT EDUCATION:  Education details: surgical precautions, diagnosis, prognosis, anatomy, exercise progression, DOMS expectations, muscle firing,  envelope of function, HEP, POC Person educated: Patient Education method: Explanation, Demonstration, Tactile cues, Verbal cues, and Handouts Education comprehension: verbalized understanding, returned demonstration, verbal cues required, and tactile cues required  HOME EXERCISE PROGRAM:  Access Code: [email or web address] URL: [email or web address]  ASSESSMENT:  CLINICAL IMPRESSION: Pt at 94 of flexion today and -9 deg of ext today at end of session. Pt demos better quad contraction and able to 10 SLR with mild extensor lag due to tightness. Pt HEP progressed today with focus placed on knee ext. Pt was also able to progress to [facility] for level surface ambulation. Pt continues to be high at baseline but pt is able to tolerate PT session without signficnat increase. Continue with ROM as able with focus on ext. Progress L LE WB tolerance as able and recheck for tolerance to recent change of AD. Pt would benefit from continued skilled therapy in order to reach goals and maximize functional L LE strength and ROM for return to normal ADL and community ambulation.  Eval: Patient is a 68 y.o. female who was seen today for physical therapy evaluation and treatment for c/c of L TKA. Pt's pain is highly sensitive and irritable with movement. Pt has very good knee flexion for 3 days  post op but is more limited with extension as expected. Pt has been positioning to avoid knee flexion contracture but advised to actively perform heel propped quad sets and passive knee ext sitting more frequently during the day. Pt has been active and been performing hourly movement and taking blood thinners for clot prevention. No signs of infection. Plan to continue with L knee ROM and quad activation at future sessions. Consider repeat of heel propped quad sets, standing TKE, and low grade joint mobs at next. Pt would benefit from continued skilled therapy in order to reach goals and maximize functional L LE strength and ROM for return to normal ADL and community ambulation.   OBJECTIVE IMPAIRMENTS decreased mobility, difficulty walking, decreased ROM, decreased strength, increased muscle spasms, improper body mechanics, postural dysfunction, and pain.    ACTIVITY LIMITATIONS lifting, sitting, standing, squatting, stairs, transfers, locomotion level   PARTICIPATION LIMITATIONS: cleaning, laundry, driving, shopping, community activity, occupation, yard work, and exercise   PERSONAL FACTORS Age, Fitness, Time since onset of injury/illness/exacerbation, and 2+ comorbidity:    are also affecting patient's functional outcome.    REHAB POTENTIAL: Good   CLINICAL DECISION MAKING: unstable/complicated   EVALUATION COMPLEXITY: Moderate     GOALS:     SHORT TERM GOALS: Target date: 04/28/2023    Pt will become independent with HEP in order to demonstrate synthesis of PT education..   Goal status: INITIAL   2.  Pt will score at least 19 pt increase on FOTO to demonstrate  functional improvement in MCII and pt perceived function.      Goal status: INITIAL   3.  Pt will be able to demonstrate 0 deg of knee ext and 120 of knee flexion in order to demonstrate functional improvement in LE function for self-care and house hold duties.     Goal status: INITIAL  .  Pt will be able to demonstrate  ability to walk independently for household distances in order to demonstrate functional improvement in LE function for self-care and house hold duties.     Goal status: INITIAL     LONG TERM GOALS: Target date: 06/09/2023     Pt  will become independent with final HEP in order to demonstrate synthesis of PT education.   Goal status: INITIAL   2.  Pt will score >/= 47 on FOTO to demonstrate improvement in perceived L knee function.    Goal status: INITIAL   3.  Pt will be able to demonstrate reciprocal stair management with single UE assist in order to demonstrate functional improvement in LE function for self-care and house hold mobility.   Goal status: INITIAL   4.  Pt will be able to demonstrate/report ability to walk >20 mins without pain in order to demonstrate functional improvement and tolerance to exercise and community mobility.    Goal status: INITIAL       PLAN: PT FREQUENCY: 1-2x/week   PT DURATION: 12 weeks (likely DC in 8)   PLANNED INTERVENTIONS: Therapeutic exercises, Therapeutic activity, Neuromuscular re-education, Balance training, Gait training, Patient/Family education, Self Care, and Joint mobilization   PLAN FOR NEXT SESSION: continue per TKA protocol focus on promoting knee extension and working towards a SLR  Zebedee Iba PT, DPT 03/30/23 2:44 PM

## 2023-04-01 ENCOUNTER — Ambulatory Visit (HOSPITAL_BASED_OUTPATIENT_CLINIC_OR_DEPARTMENT_OTHER): Payer: Medicare Other

## 2023-04-01 ENCOUNTER — Encounter (HOSPITAL_BASED_OUTPATIENT_CLINIC_OR_DEPARTMENT_OTHER): Payer: Self-pay

## 2023-04-01 ENCOUNTER — Other Ambulatory Visit: Payer: Self-pay | Admitting: Neurology

## 2023-04-01 DIAGNOSIS — R293 Abnormal posture: Secondary | ICD-10-CM

## 2023-04-01 DIAGNOSIS — R262 Difficulty in walking, not elsewhere classified: Secondary | ICD-10-CM

## 2023-04-01 DIAGNOSIS — M6281 Muscle weakness (generalized): Secondary | ICD-10-CM

## 2023-04-01 DIAGNOSIS — M25562 Pain in left knee: Secondary | ICD-10-CM

## 2023-04-01 DIAGNOSIS — M25662 Stiffness of left knee, not elsewhere classified: Secondary | ICD-10-CM

## 2023-04-01 NOTE — Therapy (Signed)
OUTPATIENT PHYSICAL THERAPY LOWER EXTREMITY TREATMENT   Patient Name: Sheri Burns MRN: [id number] DOB:June 07, 1955, 68 y.o., female Today's Date: 04/01/2023  END OF SESSION:  PT End of Session - 04/01/23 1610     Visit Number 5    Number of Visits 19    Date for PT Re-Evaluation 06/15/23    Authorization Type BCBS MCR    PT Start Time 1435    PT Stop Time 1513    PT Time Calculation (min) 38 min    Activity Tolerance Patient tolerated treatment well    Behavior During Therapy WFL for tasks assessed/performed                 Past Medical History:  Diagnosis Date   Anemia    Bulging lumbar disc 1980   Chronic pansinusitis    Colon polyp    GERD (gastroesophageal reflux disease)    Gynecological examination    sees Dr. Aram Beecham Romine    Hypertension    Multiple sclerosis (HCC) since 1992   Dr. Despina Arias in [facility]   Multiple thyroid nodules 1995   per pt. resolved on its own follwed by endo then released   Neurogenic bladder disorder 12/2009   w/ urge & stress inc. dx. Dr. McDiarmid   Neuropathy    Osteoarthritis    Osteoporosis    last DEXA 2018   Overactive bladder    sees Dr. Lorin Picket McDiarmid    Pancreatitis 1977   Ulcerative proctitis [facility])    Vision abnormalities    Past Surgical History:  Procedure Laterality Date   APPENDECTOMY     breast ruptured  left   ductal cyst   CARPAL TUNNEL RELEASE Right    CHOLECYSTECTOMY     COLONOSCOPY  01/10/2009   repeat in 5 yrs Dr. Charlott Rakes   CYSTOSCOPY WITH BIOPSY N/A 01/06/2022   Procedure: CYSTOSCOPY WITH BLADDER/URETHRAL BIOPSY;  Surgeon: Sebastian Ache, MD;  Location: [facility];  Service: Urology;  Laterality: N/A;  45 MINS   DILATION AND CURETTAGE OF UTERUS     LAPAROSCOPY     x 3   left knee surgery  1978   to remove bone tumor     NASAL SINUS SURGERY Bilateral 01/13/2015   Procedure: ENDOSCOPIC SINUS SURGERY;  Surgeon: Serena Colonel, MD;  Location: [facility];   Service: ENT;  Laterality: Bilateral;   NASAL SINUS SURGERY  05/2017   NASAL TURBINATE REDUCTION     pubo vaginal sling  10/2003   SPINAL FUSION     T7-S1 2023   TONSILLECTOMY     TOTAL KNEE ARTHROPLASTY Left 03/14/2023   Procedure: TOTAL KNEE ARTHROPLASTY;  Surgeon: Ollen Gross, MD;  Location: [facility];  Service: Orthopedics;  Laterality: Left;   vaginal cystocele repair     anterior   VAGINAL HYSTERECTOMY  1995   AUB & Endometriosis   widom teeth removal     Patient Active Problem List   Diagnosis Date Noted   Osteoarthritis of left knee 03/14/2023   Vertigo 08/05/2021   Chronic prescription opiate use 12/14/2018   Carpal tunnel syndrome, bilateral 09/13/2017   Sacroiliac joint pain 09/13/2017   Urinary urgency 06/22/2017   Sciatica, right side 06/17/2016   Dysesthesia 10/30/2015   Right carpal tunnel syndrome 10/30/2015   Sciatica of left side 07/16/2015   Chronic ethmoidal sinusitis 06/26/2015   High risk medication use 07/08/2014   Chronic fatigue 04/02/2014   Dysphagia 04/02/2014   Ataxic gait 04/02/2014  Vitamin D deficiency 06/08/2012   OVERACTIVE BLADDER 08/01/2009   Osteoporosis 08/01/2009   Depression with anxiety 07/04/2007   MULTIPLE SCLEROSIS 07/04/2007   OA (osteoarthritis) of knee 07/04/2007   Headache 07/04/2007   History of colonic polyps 07/04/2007    PCP: Thana Ates, MD   REFERRING PROVIDER:  Ollen Gross, MD      REFERRING DIAG: Z51.89 (ICD-10-CM) - Encounter for other specified aftercare     THERAPY DIAG:  Pain in joint of left knee  Muscle weakness (generalized)  Difficulty walking  Posture abnormality  Stiffness of left knee, not elsewhere classified  Rationale for Evaluation and Treatment: Rehabilitation  ONSET DATE: 03/13/2022 DOS  L TKA  SUBJECTIVE:   SUBJECTIVE STATEMENT:  Pt arrives with SPC, states she uses walker  outside of house but wanted to use cane to come to PT. Sleeping better and no more quad spasms. Up  to 6 minutes with heel prop.   PERTINENT HISTORY: 2/23 fusion of T7-S1 for scoliosis; L knee surgery medial condyle excision for osteochondritis desiccans; L meniscus tear; MS relapsing remitting  PAIN:  Are you having pain? Yes: NPRS scale: 7/10 Pain location: L ant and post knee Pain description: mostly burning Aggravating factors: movement, WB, flexion and ext Relieving factors: resting, sitting   PRECAUTIONS: L knee  WEIGHT BEARING RESTRICTIONS: WBAT  FALLS:  Has patient fallen in last 6 months? No  LIVING ENVIRONMENT: Lives with: lives alone Lives in: House/apartment Stairs: No Has following equipment at home: [facility] and walker, does not use either   OCCUPATION: ICU nurse, retired   PLOF: Independent with basic ADLs  PATIENT GOALS: return to normal walking  Next visit: 2 wk post op appt  OBJECTIVE:   DIAGNOSTIC FINDINGS: N/A post op  PATIENT SURVEYS:   FOTO 18 47 @ DC 19 visits 19 MCII    COGNITION: Overall cognitive status: Within functional limits for tasks assessed     SENSATION: Light touch impaired at anterior knee and lower leg on L  EDEMA:  N/A   PALPATION: TTP of anterior and posterior knee  Incision site well appearing with Aquacell in place; no erythema or excessive saturation of the bandage  LOWER EXTREMITY ROM:  Active ROM Right eval Left eval Lt 1/18  Knee flexion 118 89 100  Knee extension -3 -29 -12   (Blank rows = not tested)  LOWER EXTREMITY MMT: not indicated 2/2 surgical precautions  FUNCTIONAL TESTS:  Transfers: STS requires UE, L knee extended coming into standing  Requires UE and opp LE assisted for moving surgical limb  GAIT: Distance walked: 77ft Assistive device utilized: None Level of assistance: Mod I with 2W walker Comments: L flexed knee, antalgic, decreased L stance time, lack of heel strike and toe off   TODAY'S TREATMENT:  DATE:  1/24 PROM L knee  SAQ 3" 2x10 SLR 2x5 LAQ 3x10 Standing HR 20x Standing marches 2x10 Partial squats x10 Recumbent bike x4 min rocking    1/22  PROM ext and flexion to tolerance  Heel prop quad set with calf stretch 2s 2x10 SLR 2x10 LAQ 3x10 Standing HR 20x  Gait with SPC 188ft     Treatment                            03/26/23:  Supine SAQ over towel roll Heel prop on towel roll- quad set to SLR Heel slide with strap- hold 3 deep breaths Standing glut set Standing gastroc stretch Gait with glut set at heel strike Recumb bike.    1/16: PROM flexion and extension SAQ x10 LAQ 2x10 Gait in hall with FWW x30ft Standing heel raise 2x10 Partial squats x10   PATIENT EDUCATION:  Education details: surgical precautions, diagnosis, prognosis, anatomy, exercise progression, DOMS expectations, muscle firing,  envelope of function, HEP, POC Person educated: Patient Education method: Explanation, Demonstration, Tactile cues, Verbal cues, and Handouts Education comprehension: verbalized understanding, returned demonstration, verbal cues required, and tactile cues required  HOME EXERCISE PROGRAM:  Access Code: [email or web address] URL: [email or web address]  ASSESSMENT:  CLINICAL IMPRESSION: Patient with improving tolerance for exercise with decreasing pain level.  She has been using SPC comfortably at home though apprehensive with using in public just yet.  Will continue to work on quad strength and gait training to ease transition to [facility].  Patient remains limited in range of motion though continues to work hard on this with HEP.  Trialed partial squats today with cueing required for technique.  Patient did continue with this 10 reps.  Patient to continue with range of motion focus especially knee extension with HEP at home.  Will continue to monitor her progress with this. Eval: Patient is a 68 y.o. female who was seen  today for physical therapy evaluation and treatment for c/c of L TKA. Pt's pain is highly sensitive and irritable with movement. Pt has very good knee flexion for 3 days post op but is more limited with extension as expected. Pt has been positioning to avoid knee flexion contracture but advised to actively perform heel propped quad sets and passive knee ext sitting more frequently during the day. Pt has been active and been performing hourly movement and taking blood thinners for clot prevention. No signs of infection. Plan to continue with L knee ROM and quad activation at future sessions. Consider repeat of heel propped quad sets, standing TKE, and low grade joint mobs at next. Pt would benefit from continued skilled therapy in order to reach goals and maximize functional L LE strength and ROM for return to normal ADL and community ambulation.   OBJECTIVE IMPAIRMENTS decreased mobility, difficulty walking, decreased ROM, decreased strength, increased muscle spasms, improper body mechanics, postural dysfunction, and pain.    ACTIVITY LIMITATIONS lifting, sitting, standing, squatting, stairs, transfers, locomotion level   PARTICIPATION LIMITATIONS: cleaning, laundry, driving, shopping, community activity, occupation, yard work, and exercise   PERSONAL FACTORS Age, Fitness, Time since onset of injury/illness/exacerbation, and 2+ comorbidity:    are also affecting patient's functional outcome.    REHAB POTENTIAL: Good   CLINICAL DECISION MAKING: unstable/complicated   EVALUATION COMPLEXITY: Moderate     GOALS:     SHORT TERM GOALS: Target date: 04/28/2023    Pt will become independent with HEP in order to demonstrate synthesis  of PT education..   Goal status: INITIAL   2.  Pt will score at least 19 pt increase on FOTO to demonstrate functional improvement in MCII and pt perceived function.      Goal status: INITIAL   3.  Pt will be able to demonstrate 0 deg of knee ext and 120 of knee  flexion in order to demonstrate functional improvement in LE function for self-care and house hold duties.     Goal status: INITIAL  .  Pt will be able to demonstrate ability to walk independently for household distances in order to demonstrate functional improvement in LE function for self-care and house hold duties.     Goal status: INITIAL     LONG TERM GOALS: Target date: 06/09/2023     Pt  will become independent with final HEP in order to demonstrate synthesis of PT education.   Goal status: INITIAL   2.  Pt will score >/= 47 on FOTO to demonstrate improvement in perceived L knee function.    Goal status: INITIAL   3.  Pt will be able to demonstrate reciprocal stair management with single UE assist in order to demonstrate functional improvement in LE function for self-care and house hold mobility.   Goal status: INITIAL   4.  Pt will be able to demonstrate/report ability to walk >20 mins without pain in order to demonstrate functional improvement and tolerance to exercise and community mobility.    Goal status: INITIAL       PLAN: PT FREQUENCY: 1-2x/week   PT DURATION: 12 weeks (likely DC in 8)   PLANNED INTERVENTIONS: Therapeutic exercises, Therapeutic activity, Neuromuscular re-education, Balance training, Gait training, Patient/Family education, Self Care, and Joint mobilization   PLAN FOR NEXT SESSION: continue per TKA protocol focus on promoting knee extension and working towards a [employer], PTA  04/01/23 4:13 PM

## 2023-04-06 ENCOUNTER — Ambulatory Visit (HOSPITAL_BASED_OUTPATIENT_CLINIC_OR_DEPARTMENT_OTHER): Payer: Medicare Other | Admitting: Physical Therapy

## 2023-04-06 ENCOUNTER — Encounter (HOSPITAL_BASED_OUTPATIENT_CLINIC_OR_DEPARTMENT_OTHER): Payer: Self-pay | Admitting: Physical Therapy

## 2023-04-06 DIAGNOSIS — R262 Difficulty in walking, not elsewhere classified: Secondary | ICD-10-CM | POA: Diagnosis not present

## 2023-04-06 DIAGNOSIS — M6281 Muscle weakness (generalized): Secondary | ICD-10-CM | POA: Diagnosis not present

## 2023-04-06 DIAGNOSIS — M25562 Pain in left knee: Secondary | ICD-10-CM

## 2023-04-06 DIAGNOSIS — R293 Abnormal posture: Secondary | ICD-10-CM | POA: Diagnosis not present

## 2023-04-06 DIAGNOSIS — M25662 Stiffness of left knee, not elsewhere classified: Secondary | ICD-10-CM | POA: Diagnosis not present

## 2023-04-06 NOTE — Therapy (Signed)
OUTPATIENT PHYSICAL THERAPY LOWER EXTREMITY TREATMENT   Patient Name: Sheri Burns MRN: [id number] DOB:03-01-1956, 68 y.o., female Today's Date: 04/06/2023  END OF SESSION:  PT End of Session - 04/06/23 1445     Visit Number 6    Number of Visits 19    Date for PT Re-Evaluation 06/15/23    Authorization Type BCBS MCR    PT Start Time 1445    PT Stop Time 1524    PT Time Calculation (min) 39 min    Activity Tolerance Patient tolerated treatment well    Behavior During Therapy WFL for tasks assessed/performed                 Past Medical History:  Diagnosis Date   Anemia    Bulging lumbar disc 1980   Chronic pansinusitis    Colon polyp    GERD (gastroesophageal reflux disease)    Gynecological examination    sees Dr. Aram Beecham Romine    Hypertension    Multiple sclerosis (HCC) since 1992   Dr. Despina Arias in [facility]   Multiple thyroid nodules 1995   per pt. resolved on its own follwed by endo then released   Neurogenic bladder disorder 12/2009   w/ urge & stress inc. dx. Dr. McDiarmid   Neuropathy    Osteoarthritis    Osteoporosis    last DEXA 2018   Overactive bladder    sees Dr. Lorin Picket McDiarmid    Pancreatitis 1977   Ulcerative proctitis [facility])    Vision abnormalities    Past Surgical History:  Procedure Laterality Date   APPENDECTOMY     breast ruptured  left   ductal cyst   CARPAL TUNNEL RELEASE Right    CHOLECYSTECTOMY     COLONOSCOPY  01/10/2009   repeat in 5 yrs Dr. Charlott Rakes   CYSTOSCOPY WITH BIOPSY N/A 01/06/2022   Procedure: CYSTOSCOPY WITH BLADDER/URETHRAL BIOPSY;  Surgeon: Sebastian Ache, MD;  Location: [facility];  Service: Urology;  Laterality: N/A;  45 MINS   DILATION AND CURETTAGE OF UTERUS     LAPAROSCOPY     x 3   left knee surgery  1978   to remove bone tumor     NASAL SINUS SURGERY Bilateral 01/13/2015   Procedure: ENDOSCOPIC SINUS SURGERY;  Surgeon: Serena Colonel, MD;  Location: [facility];   Service: ENT;  Laterality: Bilateral;   NASAL SINUS SURGERY  05/2017   NASAL TURBINATE REDUCTION     pubo vaginal sling  10/2003   SPINAL FUSION     T7-S1 2023   TONSILLECTOMY     TOTAL KNEE ARTHROPLASTY Left 03/14/2023   Procedure: TOTAL KNEE ARTHROPLASTY;  Surgeon: Ollen Gross, MD;  Location: [facility];  Service: Orthopedics;  Laterality: Left;   vaginal cystocele repair     anterior   VAGINAL HYSTERECTOMY  1995   AUB & Endometriosis   widom teeth removal     Patient Active Problem List   Diagnosis Date Noted   Osteoarthritis of left knee 03/14/2023   Vertigo 08/05/2021   Chronic prescription opiate use 12/14/2018   Carpal tunnel syndrome, bilateral 09/13/2017   Sacroiliac joint pain 09/13/2017   Urinary urgency 06/22/2017   Sciatica, right side 06/17/2016   Dysesthesia 10/30/2015   Right carpal tunnel syndrome 10/30/2015   Sciatica of left side 07/16/2015   Chronic ethmoidal sinusitis 06/26/2015   High risk medication use 07/08/2014   Chronic fatigue 04/02/2014   Dysphagia 04/02/2014   Ataxic gait 04/02/2014  Vitamin D deficiency 06/08/2012   OVERACTIVE BLADDER 08/01/2009   Osteoporosis 08/01/2009   Depression with anxiety 07/04/2007   MULTIPLE SCLEROSIS 07/04/2007   OA (osteoarthritis) of knee 07/04/2007   Headache 07/04/2007   History of colonic polyps 07/04/2007    PCP: Thana Ates, MD   REFERRING PROVIDER:  Ollen Gross, MD      REFERRING DIAG: Z51.89 (ICD-10-CM) - Encounter for other specified aftercare     THERAPY DIAG:  Pain in joint of left knee  Muscle weakness (generalized)  Difficulty walking  Rationale for Evaluation and Treatment: Rehabilitation  ONSET DATE: 03/13/2022 DOS  L TKA  SUBJECTIVE:   SUBJECTIVE STATEMENT:  Pt has not needed pain meds since Saturday at night. Pt reports less quad spasms. "The knee feels much better today." Pt reports using SPC indoors and out. Dees MD 2/14  PERTINENT HISTORY: 2/23 fusion of T7-S1 for  scoliosis; L knee surgery medial condyle excision for osteochondritis desiccans; L meniscus tear; MS relapsing remitting  PAIN:  Are you having pain? Yes: NPRS scale: 5/10 Pain location: L ant and post knee Pain description: mostly burning Aggravating factors: movement, WB, flexion and ext Relieving factors: resting, sitting   PRECAUTIONS: L knee  WEIGHT BEARING RESTRICTIONS: WBAT  FALLS:  Has patient fallen in last 6 months? No  LIVING ENVIRONMENT: Lives with: lives alone Lives in: House/apartment Stairs: No Has following equipment at home: [facility] and walker, does not use either   OCCUPATION: ICU nurse, retired   PLOF: Independent with basic ADLs  PATIENT GOALS: return to normal walking  Next visit: 2 wk post op appt  OBJECTIVE:   DIAGNOSTIC FINDINGS: N/A post op  PATIENT SURVEYS:   FOTO 18 47 @ DC 19 visits 19 MCII    COGNITION: Overall cognitive status: Within functional limits for tasks assessed     SENSATION: Light touch impaired at anterior knee and lower leg on L  EDEMA:  N/A   PALPATION: TTP of anterior and posterior knee  Incision site well appearing with Aquacell in place; no erythema or excessive saturation of the bandage  LOWER EXTREMITY ROM:  Active ROM Right eval Left eval Lt 1/18  Knee flexion 118 89 100  Knee extension -3 -29 -12   (Blank rows = not tested)  LOWER EXTREMITY MMT: not indicated 2/2 surgical precautions  FUNCTIONAL TESTS:  Transfers: STS requires UE, L knee extended coming into standing  Requires UE and opp LE assisted for moving surgical limb  GAIT: Distance walked: 52ft Assistive device utilized: None Level of assistance: Mod I with 2W walker Comments: L flexed knee, antalgic, decreased L stance time, lack of heel strike and toe off   TODAY'S TREATMENT:                                                                                                                              DATE:  1/29  Recumbent bike  x5 min  PROM L knee  Grade II TKE mob L knee STM L HS (not tolerable after first few minutes) HS stretch with strap 30s; HS stretch on table 30s 2x Sidestepping at rail 3s laps HS iso 5s (too painful) Seated LAQ 20x    1/24 PROM L knee  SAQ 3" 2x10 SLR 2x5 LAQ 3x10 Standing HR 20x Standing marches 2x10 Partial squats x10 Recumbent bike x4 min rocking    1/22  PROM ext and flexion to tolerance  Heel prop quad set with calf stretch 2s 2x10 SLR 2x10 LAQ 3x10 Standing HR 20x  Gait with SPC 177ft     Treatment                            03/26/23:  Supine SAQ over towel roll Heel prop on towel roll- quad set to SLR Heel slide with strap- hold 3 deep breaths Standing glut set Standing gastroc stretch Gait with glut set at heel strike Recumb bike.    1/16: PROM flexion and extension SAQ x10 LAQ 2x10 Gait in hall with FWW x87ft Standing heel raise 2x10 Partial squats x10   PATIENT EDUCATION:  Education details: surgical precautions, diagnosis, prognosis, anatomy, exercise progression, DOMS expectations, muscle firing,  envelope of function, HEP, POC Person educated: Patient Education method: Explanation, Demonstration, Tactile cues, Verbal cues, and Handouts Education comprehension: verbalized understanding, returned demonstration, verbal cues required, and tactile cues required  HOME EXERCISE PROGRAM:  Access Code: [email or web address] URL: [email or web address]  ASSESSMENT:  CLINICAL IMPRESSION:  Pt started session with good tolerance to PROM and light stretching but STM and HS isolated motions greatly increased pain that continues to linger. Pt was able to perform seated exercise with less irritation but standing was more irritable. Pt advised to start new HS stretching at home as well as considering heat to the posterior thigh. Advised to avoid heating the knee itself and to stop heating if swelling increases at the knee joint. Plan to revisit HS  length at next session as this is likely contributing to lack of TKE. Pt measured at -12 of ext. Pt was able to get up to 110 flexion and make full revolutions on recumbent bike today.  Eval: Patient is a 68 y.o. female who was seen today for physical therapy evaluation and treatment for c/c of L TKA. Pt's pain is highly sensitive and irritable with movement. Pt has very good knee flexion for 3 days post op but is more limited with extension as expected. Pt has been positioning to avoid knee flexion contracture but advised to actively perform heel propped quad sets and passive knee ext sitting more frequently during the day. Pt has been active and been performing hourly movement and taking blood thinners for clot prevention. No signs of infection. Plan to continue with L knee ROM and quad activation at future sessions. Consider repeat of heel propped quad sets, standing TKE, and low grade joint mobs at next. Pt would benefit from continued skilled therapy in order to reach goals and maximize functional L LE strength and ROM for return to normal ADL and community ambulation.   OBJECTIVE IMPAIRMENTS decreased mobility, difficulty walking, decreased ROM, decreased strength, increased muscle spasms, improper body mechanics, postural dysfunction, and pain.    ACTIVITY LIMITATIONS lifting, sitting, standing, squatting, stairs, transfers, locomotion level   PARTICIPATION LIMITATIONS: cleaning, laundry, driving, shopping, community activity, occupation, yard work, and exercise   PERSONAL FACTORS Age, Fitness, Time since onset of injury/illness/exacerbation, and 2+ comorbidity:  are also affecting patient's functional outcome.    REHAB POTENTIAL: Good   CLINICAL DECISION MAKING: unstable/complicated   EVALUATION COMPLEXITY: Moderate     GOALS:     SHORT TERM GOALS: Target date: 04/28/2023    Pt will become independent with HEP in order to demonstrate synthesis of PT education..   Goal status:  INITIAL   2.  Pt will score at least 19 pt increase on FOTO to demonstrate functional improvement in MCII and pt perceived function.      Goal status: INITIAL   3.  Pt will be able to demonstrate 0 deg of knee ext and 120 of knee flexion in order to demonstrate functional improvement in LE function for self-care and house hold duties.     Goal status: INITIAL  .  Pt will be able to demonstrate ability to walk independently for household distances in order to demonstrate functional improvement in LE function for self-care and house hold duties.     Goal status: INITIAL     LONG TERM GOALS: Target date: 06/09/2023     Pt  will become independent with final HEP in order to demonstrate synthesis of PT education.   Goal status: INITIAL   2.  Pt will score >/= 47 on FOTO to demonstrate improvement in perceived L knee function.    Goal status: INITIAL   3.  Pt will be able to demonstrate reciprocal stair management with single UE assist in order to demonstrate functional improvement in LE function for self-care and house hold mobility.   Goal status: INITIAL   4.  Pt will be able to demonstrate/report ability to walk >20 mins without pain in order to demonstrate functional improvement and tolerance to exercise and community mobility.    Goal status: INITIAL       PLAN: PT FREQUENCY: 1-2x/week   PT DURATION: 12 weeks (likely DC in 8)   PLANNED INTERVENTIONS: Therapeutic exercises, Therapeutic activity, Neuromuscular re-education, Balance training, Gait training, Patient/Family education, Self Care, and Joint mobilization   PLAN FOR NEXT SESSION: continue per TKA protocol focus on promoting knee extension   Zebedee Iba PT, DPT 04/06/23 3:29 PM

## 2023-04-08 ENCOUNTER — Ambulatory Visit (HOSPITAL_BASED_OUTPATIENT_CLINIC_OR_DEPARTMENT_OTHER): Payer: Medicare Other | Admitting: Physical Therapy

## 2023-04-08 DIAGNOSIS — M6281 Muscle weakness (generalized): Secondary | ICD-10-CM | POA: Diagnosis not present

## 2023-04-08 DIAGNOSIS — R262 Difficulty in walking, not elsewhere classified: Secondary | ICD-10-CM

## 2023-04-08 DIAGNOSIS — M25562 Pain in left knee: Secondary | ICD-10-CM | POA: Diagnosis not present

## 2023-04-08 DIAGNOSIS — M25662 Stiffness of left knee, not elsewhere classified: Secondary | ICD-10-CM | POA: Diagnosis not present

## 2023-04-08 DIAGNOSIS — R293 Abnormal posture: Secondary | ICD-10-CM | POA: Diagnosis not present

## 2023-04-08 NOTE — Therapy (Unsigned)
OUTPATIENT PHYSICAL THERAPY LOWER EXTREMITY TREATMENT   Patient Name: Sheri Burns MRN: [id number] DOB:August 17, 1955, 68 y.o., female Today's Date: 04/09/2023  END OF SESSION:  PT End of Session - 04/08/23 1426     Visit Number 7    Number of Visits 19    Date for PT Re-Evaluation 06/15/23    Authorization Type BCBS MCR    PT Start Time 1345    PT Stop Time 1431    PT Time Calculation (min) 46 min    Activity Tolerance Patient tolerated treatment well    Behavior During Therapy WFL for tasks assessed/performed                  Past Medical History:  Diagnosis Date   Anemia    Bulging lumbar disc 1980   Chronic pansinusitis    Colon polyp    GERD (gastroesophageal reflux disease)    Gynecological examination    sees Dr. Aram Beecham Romine    Hypertension    Multiple sclerosis (HCC) since 1992   Dr. Despina Arias in [facility]   Multiple thyroid nodules 1995   per pt. resolved on its own follwed by endo then released   Neurogenic bladder disorder 12/2009   w/ urge & stress inc. dx. Dr. McDiarmid   Neuropathy    Osteoarthritis    Osteoporosis    last DEXA 2018   Overactive bladder    sees Dr. Lorin Picket McDiarmid    Pancreatitis 1977   Ulcerative proctitis [facility])    Vision abnormalities    Past Surgical History:  Procedure Laterality Date   APPENDECTOMY     breast ruptured  left   ductal cyst   CARPAL TUNNEL RELEASE Right    CHOLECYSTECTOMY     COLONOSCOPY  01/10/2009   repeat in 5 yrs Dr. Charlott Rakes   CYSTOSCOPY WITH BIOPSY N/A 01/06/2022   Procedure: CYSTOSCOPY WITH BLADDER/URETHRAL BIOPSY;  Surgeon: Sebastian Ache, MD;  Location: [facility];  Service: Urology;  Laterality: N/A;  45 MINS   DILATION AND CURETTAGE OF UTERUS     LAPAROSCOPY     x 3   left knee surgery  1978   to remove bone tumor     NASAL SINUS SURGERY Bilateral 01/13/2015   Procedure: ENDOSCOPIC SINUS SURGERY;  Surgeon: Serena Colonel, MD;  Location: [facility];   Service: ENT;  Laterality: Bilateral;   NASAL SINUS SURGERY  05/2017   NASAL TURBINATE REDUCTION     pubo vaginal sling  10/2003   SPINAL FUSION     T7-S1 2023   TONSILLECTOMY     TOTAL KNEE ARTHROPLASTY Left 03/14/2023   Procedure: TOTAL KNEE ARTHROPLASTY;  Surgeon: Ollen Gross, MD;  Location: [facility];  Service: Orthopedics;  Laterality: Left;   vaginal cystocele repair     anterior   VAGINAL HYSTERECTOMY  1995   AUB & Endometriosis   widom teeth removal     Patient Active Problem List   Diagnosis Date Noted   Osteoarthritis of left knee 03/14/2023   Vertigo 08/05/2021   Chronic prescription opiate use 12/14/2018   Carpal tunnel syndrome, bilateral 09/13/2017   Sacroiliac joint pain 09/13/2017   Urinary urgency 06/22/2017   Sciatica, right side 06/17/2016   Dysesthesia 10/30/2015   Right carpal tunnel syndrome 10/30/2015   Sciatica of left side 07/16/2015   Chronic ethmoidal sinusitis 06/26/2015   High risk medication use 07/08/2014   Chronic fatigue 04/02/2014   Dysphagia 04/02/2014   Ataxic gait  04/02/2014   Vitamin D deficiency 06/08/2012   OVERACTIVE BLADDER 08/01/2009   Osteoporosis 08/01/2009   Depression with anxiety 07/04/2007   MULTIPLE SCLEROSIS 07/04/2007   OA (osteoarthritis) of knee 07/04/2007   Headache 07/04/2007   History of colonic polyps 07/04/2007    PCP: Thana Ates, MD   REFERRING PROVIDER:  Ollen Gross, MD      REFERRING DIAG: Z51.89 (ICD-10-CM) - Encounter for other specified aftercare     THERAPY DIAG:  Pain in joint of left knee  Muscle weakness (generalized)  Difficulty walking  Posture abnormality  Stiffness of left knee, not elsewhere classified  Rationale for Evaluation and Treatment: Rehabilitation  ONSET DATE: 03/13/2022 DOS  L TKA  SUBJECTIVE:   SUBJECTIVE STATEMENT:  Pt has not needed pain meds since Saturday at night. Pt reports less quad spasms. "The knee feels much better today." Pt reports using SPC  indoors and out. Dees MD 2/14  PERTINENT HISTORY: 2/23 fusion of T7-S1 for scoliosis; L knee surgery medial condyle excision for osteochondritis desiccans; L meniscus tear; MS relapsing remitting  PAIN:  Are you having pain? Yes: NPRS scale: 5/10 Pain location: L ant and post knee Pain description: mostly burning Aggravating factors: movement, WB, flexion and ext Relieving factors: resting, sitting   PRECAUTIONS: L knee  WEIGHT BEARING RESTRICTIONS: WBAT  FALLS:  Has patient fallen in last 6 months? No  LIVING ENVIRONMENT: Lives with: lives alone Lives in: House/apartment Stairs: No Has following equipment at home: [facility] and walker, does not use either   OCCUPATION: ICU nurse, retired   PLOF: Independent with basic ADLs  PATIENT GOALS: return to normal walking  Next visit: 2 wk post op appt  OBJECTIVE:   DIAGNOSTIC FINDINGS: N/A post op  PATIENT SURVEYS:   FOTO 18 47 @ DC 19 visits 19 MCII    COGNITION: Overall cognitive status: Within functional limits for tasks assessed     SENSATION: Light touch impaired at anterior knee and lower leg on L  EDEMA:  N/A   PALPATION: TTP of anterior and posterior knee  Incision site well appearing with Aquacell in place; no erythema or excessive saturation of the bandage  LOWER EXTREMITY ROM:  Active ROM Right eval Left eval Lt 1/18 Left  Knee flexion 118 89 100 105  Knee extension -3 -29 -12 -10 with pain    (Blank rows = not tested)  LOWER EXTREMITY MMT: not indicated 2/2 surgical precautions  FUNCTIONAL TESTS:  Transfers: STS requires UE, L knee extended coming into standing  Requires UE and opp LE assisted for moving surgical limb  GAIT: Distance walked: 73ft Assistive device utilized: None Level of assistance: Mod I with 2W walker Comments: L flexed knee, antalgic, decreased L stance time, lack of heel strike and toe off   TODAY'S TREATMENT:  DATE: 1/31  Manual: All PROM performed with distraction to reduce pain and improve movement; Grade II and III PA femoral glides; patella mobilization with education on self mobilization.   There-ex: Quad set 15x  Reviewed hamstring stretch for home.  LAQ 3x12 reviewed RPE for loading 1st set reported at 2/10  2nd set loaded      Nuro-Re-ed  Standing heel/toe for involuntary quad fire 2x12 Standing slow march  x10 to improve weight bearing and coordination of muscle firing.     1/29  Recumbent bike x5 min  PROM L knee  Grade II TKE mob L knee STM L HS (not tolerable after first few minutes) HS stretch with strap 30s; HS stretch on table 30s 2x Sidestepping at rail 3s laps HS iso 5s (too painful) Seated LAQ 20x    1/24 PROM L knee  SAQ 3" 2x10 SLR 2x5 LAQ 3x10 Standing HR 20x Standing marches 2x10 Partial squats x10 Recumbent bike x4 min rocking    1/22  PROM ext and flexion to tolerance  Heel prop quad set with calf stretch 2s 2x10 SLR 2x10 LAQ 3x10 Standing HR 20x  Gait with SPC 157ft     Treatment                            03/26/23:  Supine SAQ over towel roll Heel prop on towel roll- quad set to SLR Heel slide with strap- hold 3 deep breaths Standing glut set Standing gastroc stretch Gait with glut set at heel strike Recumb bike.    1/16: PROM flexion and extension SAQ x10 LAQ 2x10 Gait in hall with FWW x2ft Standing heel raise 2x10 Partial squats x10   PATIENT EDUCATION:  Education details: surgical precautions, diagnosis, prognosis, anatomy, exercise progression, DOMS expectations, muscle firing,  envelope of function, HEP, POC Person educated: Patient Education method: Explanation, Demonstration, Tactile cues, Verbal cues, and Handouts Education comprehension: verbalized understanding, returned demonstration, verbal cues required, and tactile cues  required  HOME EXERCISE PROGRAM:  Access Code: [email or web address] URL: [email or web address]  ASSESSMENT:  CLINICAL IMPRESSION:  The patient continues to be limited in extension. She reports pain at the top of her knee cap with extension. She has scar tissue around that area. She was advised to perform self scar tissue mobilization at home. She intially reported her RPE was a 2/10 after LAQ. We loaded it but she reported that it acutally caused a lot of burning in the top of herknee cap on the initial set. He also reviewed heel/toe rocking for quad engamenet and stretch and well as slow march for weight bearing.     Eval: Patient is a 68 y.o. female who was seen today for physical therapy evaluation and treatment for c/c of L TKA. Pt's pain is highly sensitive and irritable with movement. Pt has very good knee flexion for 3 days post op but is more limited with extension as expected. Pt has been positioning to avoid knee flexion contracture but advised to actively perform heel propped quad sets and passive knee ext sitting more frequently during the day. Pt has been active and been performing hourly movement and taking blood thinners for clot prevention. No signs of infection. Plan to continue with L knee ROM and quad activation at future sessions. Consider repeat of heel propped quad sets, standing TKE, and low grade joint mobs at next. Pt would benefit from continued skilled therapy in order to reach goals and maximize functional  L LE strength and ROM for return to normal ADL and community ambulation.   OBJECTIVE IMPAIRMENTS decreased mobility, difficulty walking, decreased ROM, decreased strength, increased muscle spasms, improper body mechanics, postural dysfunction, and pain.    ACTIVITY LIMITATIONS lifting, sitting, standing, squatting, stairs, transfers, locomotion level   PARTICIPATION LIMITATIONS: cleaning, laundry, driving, shopping, community activity, occupation, yard work, and  exercise   PERSONAL FACTORS Age, Fitness, Time since onset of injury/illness/exacerbation, and 2+ comorbidity:    are also affecting patient's functional outcome.    REHAB POTENTIAL: Good   CLINICAL DECISION MAKING: unstable/complicated   EVALUATION COMPLEXITY: Moderate     GOALS:     SHORT TERM GOALS: Target date: 04/28/2023    Pt will become independent with HEP in order to demonstrate synthesis of PT education..   Goal status: INITIAL   2.  Pt will score at least 19 pt increase on FOTO to demonstrate functional improvement in MCII and pt perceived function.      Goal status: INITIAL   3.  Pt will be able to demonstrate 0 deg of knee ext and 120 of knee flexion in order to demonstrate functional improvement in LE function for self-care and house hold duties.     Goal status: INITIAL  .  Pt will be able to demonstrate ability to walk independently for household distances in order to demonstrate functional improvement in LE function for self-care and house hold duties.     Goal status: INITIAL     LONG TERM GOALS: Target date: 06/09/2023     Pt  will become independent with final HEP in order to demonstrate synthesis of PT education.   Goal status: INITIAL   2.  Pt will score >/= 47 on FOTO to demonstrate improvement in perceived L knee function.    Goal status: INITIAL   3.  Pt will be able to demonstrate reciprocal stair management with single UE assist in order to demonstrate functional improvement in LE function for self-care and house hold mobility.   Goal status: INITIAL   4.  Pt will be able to demonstrate/report ability to walk >20 mins without pain in order to demonstrate functional improvement and tolerance to exercise and community mobility.    Goal status: INITIAL       PLAN: PT FREQUENCY: 1-2x/week   PT DURATION: 12 weeks (likely DC in 8)   PLANNED INTERVENTIONS: Therapeutic exercises, Therapeutic activity, Neuromuscular re-education, Balance  training, Gait training, Patient/Family education, Self Care, and Joint mobilization   PLAN FOR NEXT SESSION: continue per TKA protocol focus on promoting knee extension   Zebedee Iba PT, DPT 04/09/23 8:04 AM

## 2023-04-09 ENCOUNTER — Encounter (HOSPITAL_BASED_OUTPATIENT_CLINIC_OR_DEPARTMENT_OTHER): Payer: Self-pay | Admitting: Physical Therapy

## 2023-04-13 ENCOUNTER — Ambulatory Visit (HOSPITAL_BASED_OUTPATIENT_CLINIC_OR_DEPARTMENT_OTHER): Payer: Medicare Other | Attending: Orthopedic Surgery | Admitting: Physical Therapy

## 2023-04-13 ENCOUNTER — Encounter (HOSPITAL_BASED_OUTPATIENT_CLINIC_OR_DEPARTMENT_OTHER): Payer: Self-pay | Admitting: Physical Therapy

## 2023-04-13 DIAGNOSIS — M25662 Stiffness of left knee, not elsewhere classified: Secondary | ICD-10-CM | POA: Diagnosis not present

## 2023-04-13 DIAGNOSIS — R262 Difficulty in walking, not elsewhere classified: Secondary | ICD-10-CM | POA: Diagnosis not present

## 2023-04-13 DIAGNOSIS — M6281 Muscle weakness (generalized): Secondary | ICD-10-CM | POA: Diagnosis not present

## 2023-04-13 DIAGNOSIS — R293 Abnormal posture: Secondary | ICD-10-CM | POA: Diagnosis not present

## 2023-04-13 DIAGNOSIS — M25562 Pain in left knee: Secondary | ICD-10-CM | POA: Diagnosis not present

## 2023-04-13 NOTE — Therapy (Signed)
 OUTPATIENT PHYSICAL THERAPY LOWER EXTREMITY TREATMENT   Patient Name: Sheri Burns MRN: [id number] DOB:12/06/1955, 68 y.o., female Today's Date: 04/13/2023  END OF SESSION:  PT End of Session - 04/13/23 1441     Visit Number 8    Number of Visits 19    Date for PT Re-Evaluation 06/15/23    Authorization Type BCBS MCR    PT Start Time 1400    PT Stop Time 1440    PT Time Calculation (min) 40 min    Activity Tolerance Patient tolerated treatment well    Behavior During Therapy WFL for tasks assessed/performed                   Past Medical History:  Diagnosis Date   Anemia    Bulging lumbar disc 1980   Chronic pansinusitis    Colon polyp    GERD (gastroesophageal reflux disease)    Gynecological examination    sees Dr. Montie Romine    Hypertension    Multiple sclerosis (HCC) since 1992   Dr. Charlie Crete in [facility]   Multiple thyroid nodules 1995   per pt. resolved on its own follwed by endo then released   Neurogenic bladder disorder 12/2009   w/ urge & stress inc. dx. Dr. McDiarmid   Neuropathy    Osteoarthritis    Osteoporosis    last DEXA 2018   Overactive bladder    sees Dr. Glendia McDiarmid    Pancreatitis 1977   Ulcerative proctitis [facility])    Vision abnormalities    Past Surgical History:  Procedure Laterality Date   APPENDECTOMY     breast ruptured  left   ductal cyst   CARPAL TUNNEL RELEASE Right    CHOLECYSTECTOMY     COLONOSCOPY  01/10/2009   repeat in 5 yrs Dr. Jerrell Sol   CYSTOSCOPY WITH BIOPSY N/A 01/06/2022   Procedure: CYSTOSCOPY WITH BLADDER/URETHRAL BIOPSY;  Surgeon: Alvaro Hummer, MD;  Location: [facility];  Service: Urology;  Laterality: N/A;  45 MINS   DILATION AND CURETTAGE OF UTERUS     LAPAROSCOPY     x 3   left knee surgery  1978   to remove bone tumor     NASAL SINUS SURGERY Bilateral 01/13/2015   Procedure: ENDOSCOPIC SINUS SURGERY;  Surgeon: Ida Loader, MD;  Location:  SURGERY CENTER;   Service: ENT;  Laterality: Bilateral;   NASAL SINUS SURGERY  05/2017   NASAL TURBINATE REDUCTION     pubo vaginal sling  10/2003   SPINAL FUSION     T7-S1 2023   TONSILLECTOMY     TOTAL KNEE ARTHROPLASTY Left 03/14/2023   Procedure: TOTAL KNEE ARTHROPLASTY;  Surgeon: Melodi Lerner, MD;  Location: [facility];  Service: Orthopedics;  Laterality: Left;   vaginal cystocele repair     anterior   VAGINAL HYSTERECTOMY  1995   AUB & Endometriosis   widom teeth removal     Patient Active Problem List   Diagnosis Date Noted   Osteoarthritis of left knee 03/14/2023   Vertigo 08/05/2021   Chronic prescription opiate use 12/14/2018   Carpal tunnel syndrome, bilateral 09/13/2017   Sacroiliac joint pain 09/13/2017   Urinary urgency 06/22/2017   Sciatica, right side 06/17/2016   Dysesthesia 10/30/2015   Right carpal tunnel syndrome 10/30/2015   Sciatica of left side 07/16/2015   Chronic ethmoidal sinusitis 06/26/2015   High risk medication use 07/08/2014   Chronic fatigue 04/02/2014   Dysphagia 04/02/2014   Ataxic  gait 04/02/2014   Vitamin D  deficiency 06/08/2012   OVERACTIVE BLADDER 08/01/2009   Osteoporosis 08/01/2009   Depression with anxiety 07/04/2007   MULTIPLE SCLEROSIS 07/04/2007   OA (osteoarthritis) of knee 07/04/2007   Headache 07/04/2007   History of colonic polyps 07/04/2007    PCP: Dwight Trula SQUIBB, MD   REFERRING PROVIDER:  Melodi Lerner, MD      REFERRING DIAG: Z51.89 (ICD-10-CM) - Encounter for other specified aftercare     THERAPY DIAG:  Pain in joint of left knee  Muscle weakness (generalized)  Difficulty walking  Rationale for Evaluation and Treatment: Rehabilitation  ONSET DATE: 03/13/2022 DOS  L TKA  SUBJECTIVE:   SUBJECTIVE STATEMENT:  Pt states that she was sore for about 2 days last visit. Pt did go to lunch with a friend and had to walk a lot so the knee is tighter.   PERTINENT HISTORY: 2/23 fusion of T7-S1 for scoliosis; L knee surgery medial  condyle excision for osteochondritis desiccans; L meniscus tear; MS relapsing remitting  PAIN:  Are you having pain? Yes: NPRS scale: 5/10 Pain location: L ant and post knee Pain description: mostly burning Aggravating factors: movement, WB, flexion and ext Relieving factors: resting, sitting   PRECAUTIONS: L knee  WEIGHT BEARING RESTRICTIONS: WBAT  FALLS:  Has patient fallen in last 6 months? No  LIVING ENVIRONMENT: Lives with: lives alone Lives in: House/apartment Stairs: No Has following equipment at home: [facility] and walker, does not use either   OCCUPATION: ICU nurse, retired   PLOF: Independent with basic ADLs  PATIENT GOALS: return to normal walking  Next visit: 2 wk post op appt  OBJECTIVE:   DIAGNOSTIC FINDINGS: N/A post op  PATIENT SURVEYS:   FOTO 18 47 @ DC 19 visits 19 MCII    COGNITION: Overall cognitive status: Within functional limits for tasks assessed     SENSATION: Light touch impaired at anterior knee and lower leg on L  EDEMA:  N/A   PALPATION: TTP of anterior and posterior knee  Incision site well appearing with Aquacell in place; no erythema or excessive saturation of the bandage  LOWER EXTREMITY ROM:  Active ROM Right eval Left eval Lt 1/18 Left  Knee flexion 118 89 100 105  Knee extension -3 -29 -12 -10 with pain    (Blank rows = not tested)  LOWER EXTREMITY MMT: not indicated 2/2 surgical precautions  FUNCTIONAL TESTS:  Transfers: STS requires UE, L knee extended coming into standing  Requires UE and opp LE assisted for moving surgical limb  GAIT: Distance walked: 52ft Assistive device utilized: None Level of assistance: Mod I with 2W walker Comments: L flexed knee, antalgic, decreased L stance time, lack of heel strike and toe off   TODAY'S TREATMENT:                                                                                                                              DATE:  2/5  Recumbent bike x5 min  (half revolutions today)  Patellar mobs grade III all directions Scar tissue massage and edu about self massage to increase scar length   Heel prop quad set with calf stretch 2s 2x10 Supine knee ext 2x10 2s holds  Bridge 2x10 STS from mid thigh surface 2x10     1/31  Manual: All PROM performed with distraction to reduce pain and improve movement; Grade II and III PA femoral glides; patella mobilization with education on self mobilization.   There-ex: Quad set 15x  Reviewed hamstring stretch for home.  LAQ 3x12 reviewed RPE for loading 1st set reported at 2/10  2nd set loaded      Nuro-Re-ed  Standing heel/toe for involuntary quad fire 2x12 Standing slow march  x10 to improve weight bearing and coordination of muscle firing.     1/29  Recumbent bike x5 min  PROM L knee  Grade II TKE mob L knee STM L HS (not tolerable after first few minutes) HS stretch with strap 30s; HS stretch on table 30s 2x Sidestepping at rail 3s laps HS iso 5s (too painful) Seated LAQ 20x    1/24 PROM L knee  SAQ 3 2x10 SLR 2x5 LAQ 3x10 Standing HR 20x Standing marches 2x10 Partial squats x10 Recumbent bike x4 min rocking    1/22  PROM ext and flexion to tolerance  Heel prop quad set with calf stretch 2s 2x10 SLR 2x10 LAQ 3x10 Standing HR 20x  Gait with SPC 125ft     Treatment                            03/26/23:  Supine SAQ over towel roll Heel prop on towel roll- quad set to SLR Heel slide with strap- hold 3 deep breaths Standing glut set Standing gastroc stretch Gait with glut set at heel strike Recumb bike.    1/16: PROM flexion and extension SAQ x10 LAQ 2x10 Gait in hall with FWW x41ft Standing heel raise 2x10 Partial squats x10   PATIENT EDUCATION:  Education details: surgical precautions, diagnosis, prognosis, anatomy, exercise progression, DOMS expectations, muscle firing,  envelope of function, HEP, POC Person educated: Patient Education  method: Explanation, Demonstration, Tactile cues, Verbal cues, and Handouts Education comprehension: verbalized understanding, returned demonstration, verbal cues required, and tactile cues required  HOME EXERCISE PROGRAM:  Access Code: [email or web address] URL: [email or web address]  ASSESSMENT:  CLINICAL IMPRESSION:  Pt presents today with increase in L knee flexion at rest due to recent increase in walking activity. Pt was able to reach -4 deg by end of session. Pt HEP progressed today for more loaded flexion as well as progression of knee extension strength. Plan to continue focusing on knee extension and knee extensor strength at future visits. Pain was not limiting factor during session but she does require rest breaks due to extension tightness.   Eval: Patient is a 68 y.o. female who was seen today for physical therapy evaluation and treatment for c/c of L TKA. Pt's pain is highly sensitive and irritable with movement. Pt has very good knee flexion for 3 days post op but is more limited with extension as expected. Pt has been positioning to avoid knee flexion contracture but advised to actively perform heel propped quad sets and passive knee ext sitting more frequently during the day. Pt has been active and been performing hourly movement and taking blood thinners for clot prevention. No signs of infection. Plan to  continue with L knee ROM and quad activation at future sessions. Consider repeat of heel propped quad sets, standing TKE, and low grade joint mobs at next. Pt would benefit from continued skilled therapy in order to reach goals and maximize functional L LE strength and ROM for return to normal ADL and community ambulation.   OBJECTIVE IMPAIRMENTS decreased mobility, difficulty walking, decreased ROM, decreased strength, increased muscle spasms, improper body mechanics, postural dysfunction, and pain.    ACTIVITY LIMITATIONS lifting, sitting, standing, squatting, stairs,  transfers, locomotion level   PARTICIPATION LIMITATIONS: cleaning, laundry, driving, shopping, community activity, occupation, yard work, and exercise   PERSONAL FACTORS Age, Fitness, Time since onset of injury/illness/exacerbation, and 2+ comorbidity:    are also affecting patient's functional outcome.    REHAB POTENTIAL: Good   CLINICAL DECISION MAKING: unstable/complicated   EVALUATION COMPLEXITY: Moderate     GOALS:     SHORT TERM GOALS: Target date: 04/28/2023    Pt will become independent with HEP in order to demonstrate synthesis of PT education..   Goal status: INITIAL   2.  Pt will score at least 19 pt increase on FOTO to demonstrate functional improvement in MCII and pt perceived function.      Goal status: INITIAL   3.  Pt will be able to demonstrate 0 deg of knee ext and 120 of knee flexion in order to demonstrate functional improvement in LE function for self-care and house hold duties.     Goal status: INITIAL  .  Pt will be able to demonstrate ability to walk independently for household distances in order to demonstrate functional improvement in LE function for self-care and house hold duties.     Goal status: INITIAL     LONG TERM GOALS: Target date: 06/09/2023     Pt  will become independent with final HEP in order to demonstrate synthesis of PT education.   Goal status: INITIAL   2.  Pt will score >/= 47 on FOTO to demonstrate improvement in perceived L knee function.    Goal status: INITIAL   3.  Pt will be able to demonstrate reciprocal stair management with single UE assist in order to demonstrate functional improvement in LE function for self-care and house hold mobility.   Goal status: INITIAL   4.  Pt will be able to demonstrate/report ability to walk >20 mins without pain in order to demonstrate functional improvement and tolerance to exercise and community mobility.    Goal status: INITIAL       PLAN: PT FREQUENCY: 1-2x/week   PT  DURATION: 12 weeks (likely DC in 8)   PLANNED INTERVENTIONS: Therapeutic exercises, Therapeutic activity, Neuromuscular re-education, Balance training, Gait training, Patient/Family education, Self Care, and Joint mobilization   PLAN FOR NEXT SESSION: continue per TKA protocol focus on promoting knee extension   Dale Call PT, DPT 04/13/23 3:00 PM

## 2023-04-14 DIAGNOSIS — M810 Age-related osteoporosis without current pathological fracture: Secondary | ICD-10-CM | POA: Diagnosis not present

## 2023-04-14 DIAGNOSIS — R9431 Abnormal electrocardiogram [ECG] [EKG]: Secondary | ICD-10-CM | POA: Diagnosis not present

## 2023-04-15 ENCOUNTER — Ambulatory Visit (HOSPITAL_BASED_OUTPATIENT_CLINIC_OR_DEPARTMENT_OTHER): Payer: Medicare Other

## 2023-04-15 ENCOUNTER — Encounter (HOSPITAL_BASED_OUTPATIENT_CLINIC_OR_DEPARTMENT_OTHER): Payer: Self-pay

## 2023-04-15 DIAGNOSIS — M25562 Pain in left knee: Secondary | ICD-10-CM

## 2023-04-15 DIAGNOSIS — M25662 Stiffness of left knee, not elsewhere classified: Secondary | ICD-10-CM | POA: Diagnosis not present

## 2023-04-15 DIAGNOSIS — M6281 Muscle weakness (generalized): Secondary | ICD-10-CM

## 2023-04-15 DIAGNOSIS — R262 Difficulty in walking, not elsewhere classified: Secondary | ICD-10-CM

## 2023-04-15 DIAGNOSIS — R293 Abnormal posture: Secondary | ICD-10-CM | POA: Diagnosis not present

## 2023-04-15 NOTE — Therapy (Signed)
 OUTPATIENT PHYSICAL THERAPY LOWER EXTREMITY TREATMENT   Patient Name: Sheri Burns MRN: [id number] DOB:[birth year], 68 y.o., female Today's Date: 04/15/2023  END OF SESSION:  PT End of Session - 04/15/23 1432     Visit Number 9    Number of Visits 19    Date for PT Re-Evaluation 06/15/23    Authorization Type BCBS MCR    PT Start Time 1431    PT Stop Time 1515    PT Time Calculation (min) 44 min    Activity Tolerance Patient tolerated treatment well    Behavior During Therapy WFL for tasks assessed/performed                   Past Medical History:  Diagnosis Date   Anemia    Bulging lumbar disc 1980   Chronic pansinusitis    Colon polyp    GERD (gastroesophageal reflux disease)    Gynecological examination    sees Dr. Montie Romine    Hypertension    Multiple sclerosis (HCC) since 1992   Dr. Charlie Crete in [facility]   Multiple thyroid nodules 1995   per pt. resolved on its own follwed by endo then released   Neurogenic bladder disorder 12/2009   w/ urge & stress inc. dx. Dr. McDiarmid   Neuropathy    Osteoarthritis    Osteoporosis    last DEXA 2018   Overactive bladder    sees Dr. Glendia McDiarmid    Pancreatitis 1977   Ulcerative proctitis [facility])    Vision abnormalities    Past Surgical History:  Procedure Laterality Date   APPENDECTOMY     breast ruptured  left   ductal cyst   CARPAL TUNNEL RELEASE Right    CHOLECYSTECTOMY     COLONOSCOPY  01/10/2009   repeat in 5 yrs Dr. Jerrell Sol   CYSTOSCOPY WITH BIOPSY N/A 01/06/2022   Procedure: CYSTOSCOPY WITH BLADDER/URETHRAL BIOPSY;  Surgeon: Alvaro Hummer, MD;  Location: [facility];  Service: Urology;  Laterality: N/A;  45 MINS   DILATION AND CURETTAGE OF UTERUS     LAPAROSCOPY     x 3   left knee surgery  1978   to remove bone tumor     NASAL SINUS SURGERY Bilateral 01/13/2015   Procedure: ENDOSCOPIC SINUS SURGERY;  Surgeon: Ida Loader, MD;  Location: [facility];   Service: ENT;  Laterality: Bilateral;   NASAL SINUS SURGERY  05/2017   NASAL TURBINATE REDUCTION     pubo vaginal sling  10/2003   SPINAL FUSION     T7-S1 2023   TONSILLECTOMY     TOTAL KNEE ARTHROPLASTY Left 03/14/2023   Procedure: TOTAL KNEE ARTHROPLASTY;  Surgeon: Melodi Lerner, MD;  Location: [facility];  Service: Orthopedics;  Laterality: Left;   vaginal cystocele repair     anterior   VAGINAL HYSTERECTOMY  1995   AUB & Endometriosis   widom teeth removal     Patient Active Problem List   Diagnosis Date Noted   Osteoarthritis of left knee 03/14/2023   Vertigo 08/05/2021   Chronic prescription opiate use 12/14/2018   Carpal tunnel syndrome, bilateral 09/13/2017   Sacroiliac joint pain 09/13/2017   Urinary urgency 06/22/2017   Sciatica, right side 06/17/2016   Dysesthesia 10/30/2015   Right carpal tunnel syndrome 10/30/2015   Sciatica of left side 07/16/2015   Chronic ethmoidal sinusitis 06/26/2015   High risk medication use 07/08/2014   Chronic fatigue 04/02/2014   Dysphagia 04/02/2014   Ataxic  gait 04/02/2014   Vitamin D  deficiency 06/08/2012   OVERACTIVE BLADDER 08/01/2009   Osteoporosis 08/01/2009   Depression with anxiety 07/04/2007   MULTIPLE SCLEROSIS 07/04/2007   OA (osteoarthritis) of knee 07/04/2007   Headache 07/04/2007   History of colonic polyps 07/04/2007    PCP: Dwight Trula SQUIBB, MD   REFERRING PROVIDER:  Melodi Lerner, MD      REFERRING DIAG: Z51.89 (ICD-10-CM) - Encounter for other specified aftercare     THERAPY DIAG:  Pain in joint of left knee  Muscle weakness (generalized)  Difficulty walking  Posture abnormality  Stiffness of left knee, not elsewhere classified  Rationale for Evaluation and Treatment: Rehabilitation  ONSET DATE: 03/13/2022 DOS  L TKA  SUBJECTIVE:   SUBJECTIVE STATEMENT:  Pt states that she was sore for about 2 days last visit. Pt did go to lunch with a friend and had to walk a lot so the knee is tighter.    PERTINENT HISTORY: 2/23 fusion of T7-S1 for scoliosis; L knee surgery medial condyle excision for osteochondritis desiccans; L meniscus tear; MS relapsing remitting  PAIN:  Are you having pain? Yes: NPRS scale: 5/10 Pain location: L ant and post knee Pain description: mostly burning Aggravating factors: movement, WB, flexion and ext Relieving factors: resting, sitting   PRECAUTIONS: L knee  WEIGHT BEARING RESTRICTIONS: WBAT  FALLS:  Has patient fallen in last 6 months? No  LIVING ENVIRONMENT: Lives with: lives alone Lives in: House/apartment Stairs: No Has following equipment at home: [facility] and walker, does not use either   OCCUPATION: ICU nurse, retired   PLOF: Independent with basic ADLs  PATIENT GOALS: return to normal walking  Next visit: 2 wk post op appt  OBJECTIVE:   DIAGNOSTIC FINDINGS: N/A post op  PATIENT SURVEYS:   FOTO 18 47 @ DC 19 visits 19 MCII    COGNITION: Overall cognitive status: Within functional limits for tasks assessed     SENSATION: Light touch impaired at anterior knee and lower leg on L  EDEMA:  N/A   PALPATION: TTP of anterior and posterior knee  Incision site well appearing with Aquacell in place; no erythema or excessive saturation of the bandage  LOWER EXTREMITY ROM:  Active ROM Right eval Left eval Lt 1/18 Left  Knee flexion 118 89 100 105  Knee extension -3 -29 -12 -10 with pain    (Blank rows = not tested)  LOWER EXTREMITY MMT: not indicated 2/2 surgical precautions  FUNCTIONAL TESTS:  Transfers: STS requires UE, L knee extended coming into standing  Requires UE and opp LE assisted for moving surgical limb  GAIT: Distance walked: 61ft Assistive device utilized: None Level of assistance: Mod I with 2W walker Comments: L flexed knee, antalgic, decreased L stance time, lack of heel strike and toe off   TODAY'S TREATMENT:  DATE:  2/7  Recumbent bike x5 min full  Patellar mobs grade III all directions PROM L knee  Long sit HSS 30sec x3 Supine SLR 2x10 Bridge 2x10 LAQ 5 2x10 Squats 2x10  TKE with ball at wall 5 x10 SLS 3x10sec with UE support on rail    2/5  Recumbent bike x5 min (half revolutions today)  Patellar mobs grade III all directions Scar tissue massage and edu about self massage to increase scar length   Heel prop quad set with calf stretch 2s 2x10 Supine knee ext 2x10 2s holds  Bridge 2x10 STS from mid thigh surface 2x10     1/31  Manual: All PROM performed with distraction to reduce pain and improve movement; Grade II and III PA femoral glides; patella mobilization with education on self mobilization.   There-ex: Quad set 15x  Reviewed hamstring stretch for home.  LAQ 3x12 reviewed RPE for loading 1st set reported at 2/10  2nd set loaded      Nuro-Re-ed  Standing heel/toe for involuntary quad fire 2x12 Standing slow march  x10 to improve weight bearing and coordination of muscle firing.     1/29  Recumbent bike x5 min  PROM L knee  Grade II TKE mob L knee STM L HS (not tolerable after first few minutes) HS stretch with strap 30s; HS stretch on table 30s 2x Sidestepping at rail 3s laps HS iso 5s (too painful) Seated LAQ 20x    1/24 PROM L knee  SAQ 3 2x10 SLR 2x5 LAQ 3x10 Standing HR 20x Standing marches 2x10 Partial squats x10 Recumbent bike x4 min rocking    1/22  PROM ext and flexion to tolerance  Heel prop quad set with calf stretch 2s 2x10 SLR 2x10 LAQ 3x10 Standing HR 20x  Gait with SPC 166ft     Treatment                            03/26/23:  Supine SAQ over towel roll Heel prop on towel roll- quad set to SLR Heel slide with strap- hold 3 deep breaths Standing glut set Standing gastroc stretch Gait with glut set at heel strike Recumb bike.    1/16: PROM flexion and  extension SAQ x10 LAQ 2x10 Gait in hall with FWW x89ft Standing heel raise 2x10 Partial squats x10   PATIENT EDUCATION:  Education details: surgical precautions, diagnosis, prognosis, anatomy, exercise progression, DOMS expectations, muscle firing,  envelope of function, HEP, POC Person educated: Patient Education method: Explanation, Demonstration, Tactile cues, Verbal cues, and Handouts Education comprehension: verbalized understanding, returned demonstration, verbal cues required, and tactile cues required  HOME EXERCISE PROGRAM:  Access Code: [email or web address] URL: [email or web address]  ASSESSMENT:  CLINICAL IMPRESSION:  Pt with improved knee flexion compared to previous session at entry. She was able to complete full revolutions on bike. Did well with long sit HSS to improve knee extension and HS length. She was most challenged by SLS with support and TKE with ball at wall. Will continue to work on ROM and progression of strength and stability.    Eval: Patient is a 68 y.o. female who was seen today for physical therapy evaluation and treatment for c/c of L TKA. Pt's pain is highly sensitive and irritable with movement. Pt has very good knee flexion for 3 days post op but is more limited with extension as expected. Pt has been positioning to avoid knee flexion contracture but advised to actively perform  heel propped quad sets and passive knee ext sitting more frequently during the day. Pt has been active and been performing hourly movement and taking blood thinners for clot prevention. No signs of infection. Plan to continue with L knee ROM and quad activation at future sessions. Consider repeat of heel propped quad sets, standing TKE, and low grade joint mobs at next. Pt would benefit from continued skilled therapy in order to reach goals and maximize functional L LE strength and ROM for return to normal ADL and community ambulation.   OBJECTIVE IMPAIRMENTS decreased  mobility, difficulty walking, decreased ROM, decreased strength, increased muscle spasms, improper body mechanics, postural dysfunction, and pain.    ACTIVITY LIMITATIONS lifting, sitting, standing, squatting, stairs, transfers, locomotion level   PARTICIPATION LIMITATIONS: cleaning, laundry, driving, shopping, community activity, occupation, yard work, and exercise   PERSONAL FACTORS Age, Fitness, Time since onset of injury/illness/exacerbation, and 2+ comorbidity:    are also affecting patient's functional outcome.    REHAB POTENTIAL: Good   CLINICAL DECISION MAKING: unstable/complicated   EVALUATION COMPLEXITY: Moderate     GOALS:     SHORT TERM GOALS: Target date: 04/28/2023    Pt will become independent with HEP in order to demonstrate synthesis of PT education..   Goal status: INITIAL   2.  Pt will score at least 19 pt increase on FOTO to demonstrate functional improvement in MCII and pt perceived function.      Goal status: INITIAL   3.  Pt will be able to demonstrate 0 deg of knee ext and 120 of knee flexion in order to demonstrate functional improvement in LE function for self-care and house hold duties.     Goal status: INITIAL  .  Pt will be able to demonstrate ability to walk independently for household distances in order to demonstrate functional improvement in LE function for self-care and house hold duties.     Goal status: INITIAL     LONG TERM GOALS: Target date: 06/09/2023     Pt  will become independent with final HEP in order to demonstrate synthesis of PT education.   Goal status: INITIAL   2.  Pt will score >/= 47 on FOTO to demonstrate improvement in perceived L knee function.    Goal status: INITIAL   3.  Pt will be able to demonstrate reciprocal stair management with single UE assist in order to demonstrate functional improvement in LE function for self-care and house hold mobility.   Goal status: INITIAL   4.  Pt will be able to  demonstrate/report ability to walk >20 mins without pain in order to demonstrate functional improvement and tolerance to exercise and community mobility.    Goal status: INITIAL       PLAN: PT FREQUENCY: 1-2x/week   PT DURATION: 12 weeks (likely DC in 8)   PLANNED INTERVENTIONS: Therapeutic exercises, Therapeutic activity, Neuromuscular re-education, Balance training, Gait training, Patient/Family education, Self Care, and Joint mobilization   PLAN FOR NEXT SESSION: continue per TKA protocol focus on promoting knee extension    [place]berry BRAVO Aryn Kops, PTA 04/15/2023, 3:23 PM

## 2023-04-19 DIAGNOSIS — K080 Exfoliation of teeth due to systemic causes: Secondary | ICD-10-CM | POA: Diagnosis not present

## 2023-04-20 ENCOUNTER — Encounter (HOSPITAL_BASED_OUTPATIENT_CLINIC_OR_DEPARTMENT_OTHER): Payer: Self-pay | Admitting: Physical Therapy

## 2023-04-20 ENCOUNTER — Ambulatory Visit (HOSPITAL_BASED_OUTPATIENT_CLINIC_OR_DEPARTMENT_OTHER): Payer: Medicare Other | Admitting: Physical Therapy

## 2023-04-20 DIAGNOSIS — R293 Abnormal posture: Secondary | ICD-10-CM | POA: Diagnosis not present

## 2023-04-20 DIAGNOSIS — M6281 Muscle weakness (generalized): Secondary | ICD-10-CM

## 2023-04-20 DIAGNOSIS — R262 Difficulty in walking, not elsewhere classified: Secondary | ICD-10-CM

## 2023-04-20 DIAGNOSIS — M25562 Pain in left knee: Secondary | ICD-10-CM | POA: Diagnosis not present

## 2023-04-20 DIAGNOSIS — M25662 Stiffness of left knee, not elsewhere classified: Secondary | ICD-10-CM | POA: Diagnosis not present

## 2023-04-20 NOTE — Therapy (Signed)
OUTPATIENT PHYSICAL THERAPY LOWER EXTREMITY TREATMENT  Progress Note Reporting Period 03/17/23 to 04/20/23   See note below for Objective Data and Assessment of Progress/Goals.       Patient Name: Sheri Burns MRN: [id number] DOB:[birth year], 68 y.o., female Today's Date: 04/20/2023  END OF SESSION:  PT End of Session - 04/20/23 1447     Visit Number 10    Number of Visits 19    Date for PT Re-Evaluation 06/15/23    Authorization Type BCBS MCR    PT Start Time 1400    PT Stop Time 1443    PT Time Calculation (min) 43 min    Activity Tolerance Patient tolerated treatment well    Behavior During Therapy WFL for tasks assessed/performed                    Past Medical History:  Diagnosis Date   Anemia    Bulging lumbar disc 1980   Chronic pansinusitis    Colon polyp    GERD (gastroesophageal reflux disease)    Gynecological examination    sees Dr. Aram Beecham Romine    Hypertension    Multiple sclerosis (HCC) since 1992   Dr. Despina Arias in [facility]   Multiple thyroid nodules 1995   per pt. resolved on its own follwed by endo then released   Neurogenic bladder disorder 12/2009   w/ urge & stress [facility]. dx. Dr. McDiarmid   Neuropathy    Osteoarthritis    Osteoporosis    last DEXA 2018   Overactive bladder    sees Dr. Lorin Picket McDiarmid    Pancreatitis 1977   Ulcerative proctitis [facility])    Vision abnormalities    Past Surgical History:  Procedure Laterality Date   APPENDECTOMY     breast ruptured  left   ductal cyst   CARPAL TUNNEL RELEASE Right    CHOLECYSTECTOMY     COLONOSCOPY  01/10/2009   repeat in 5 yrs Dr. Charlott Rakes   CYSTOSCOPY WITH BIOPSY N/A 01/06/2022   Procedure: CYSTOSCOPY WITH BLADDER/URETHRAL BIOPSY;  Surgeon: Sebastian Ache, MD;  Location: [facility];  Service: Urology;  Laterality: N/A;  45 MINS   DILATION AND CURETTAGE OF UTERUS     LAPAROSCOPY     x 3   left knee surgery  1978   to remove bone tumor     NASAL SINUS  SURGERY Bilateral 01/13/2015   Procedure: ENDOSCOPIC SINUS SURGERY;  Surgeon: Serena Colonel, MD;  Location: [facility];  Service: ENT;  Laterality: Bilateral;   NASAL SINUS SURGERY  05/2017   NASAL TURBINATE REDUCTION     pubo vaginal sling  10/2003   SPINAL FUSION     T7-S1 2023   TONSILLECTOMY     TOTAL KNEE ARTHROPLASTY Left 03/14/2023   Procedure: TOTAL KNEE ARTHROPLASTY;  Surgeon: Ollen Gross, MD;  Location: [facility];  Service: Orthopedics;  Laterality: Left;   vaginal cystocele repair     anterior   VAGINAL HYSTERECTOMY  1995   AUB & Endometriosis   widom teeth removal     Patient Active Problem List   Diagnosis Date Noted   Osteoarthritis of left knee 03/14/2023   Vertigo 08/05/2021   Chronic prescription opiate use 12/14/2018   Carpal tunnel syndrome, bilateral 09/13/2017   Sacroiliac joint pain 09/13/2017   Urinary urgency 06/22/2017   Sciatica, right side 06/17/2016   Dysesthesia 10/30/2015   Right carpal tunnel syndrome 10/30/2015   Sciatica of left side 07/16/2015  Chronic ethmoidal sinusitis 06/26/2015   High risk medication use 07/08/2014   Chronic fatigue 04/02/2014   Dysphagia 04/02/2014   Ataxic gait 04/02/2014   Vitamin D deficiency 06/08/2012   OVERACTIVE BLADDER 08/01/2009   Osteoporosis 08/01/2009   Depression with anxiety 07/04/2007   MULTIPLE SCLEROSIS 07/04/2007   OA (osteoarthritis) of knee 07/04/2007   Headache 07/04/2007   History of colonic polyps 07/04/2007    PCP: Thana Ates, MD   REFERRING PROVIDER:  Ollen Gross, MD      REFERRING DIAG: Z51.89 (ICD-10-CM) - Encounter for other specified aftercare     THERAPY DIAG:  Pain in joint of left knee  Muscle weakness (generalized)  Difficulty walking  Rationale for Evaluation and Treatment: Rehabilitation  ONSET DATE: 03/13/2022 DOS  L TKA  SUBJECTIVE:   SUBJECTIVE STATEMENT:  Pt states the knee feels okay. She felt some L SIJ pain over the weekend but it did  not last for over a day.   PERTINENT HISTORY: 2/23 fusion of T7-S1 for scoliosis; L knee surgery medial condyle excision for osteochondritis desiccans; L meniscus tear; MS relapsing remitting  PAIN:  Are you having pain? Yes: NPRS scale: 4/10 Pain location: L ant and post knee Pain description: mostly burning Aggravating factors: movement, WB, flexion and ext Relieving factors: resting, sitting   PRECAUTIONS: L knee  WEIGHT BEARING RESTRICTIONS: WBAT  FALLS:  Has patient fallen in last 6 months? No  LIVING ENVIRONMENT: Lives with: lives alone Lives in: House/apartment Stairs: No Has following equipment at home: [facility] and walker, does not use either   OCCUPATION: ICU nurse, retired   PLOF: Independent with basic ADLs  PATIENT GOALS: return to normal walking  Next visit: 2 wk post op appt  OBJECTIVE:   DIAGNOSTIC FINDINGS: N/A post op  PATIENT SURVEYS:   FOTO 18 47 @ DC 19 visits 19 MCII    COGNITION: Overall cognitive status: Within functional limits for tasks assessed     SENSATION: Light touch impaired at anterior knee and lower leg on L  EDEMA:  N/A   PALPATION: TTP of anterior and posterior knee  Incision site well appearing with Aquacell in place; no erythema or excessive saturation of the bandage  LOWER EXTREMITY ROM:  Active ROM Right eval Left eval Lt 1/18 Left L  2/12  Knee flexion 118 89 100 105 115  Knee extension -3 -29 -12 -10 with pain  -3   (Blank rows = not tested)  LOWER EXTREMITY MMT: 4/5 of L knee  FUNCTIONAL TESTS:  Transfers: STS requires UE  Requires UE and opp LE assisted for moving surgical limb  GAIT: Distance walked: 78ft Assistive device utilized: None Level of assistance: indepent Comments: L flexed knee, antalgic,lack of heel strike and toe off   TODAY'S TREATMENT:  DATE:  2/12  Recumbent bike x5 min full  Patellar mobs grade III all directions Standing gastroc stretch 30s 3x Side stepping RTB 3x  RTB TKE 3x10 Standing adductor/medial HS stretch 30s 3x  Seated knee ext YTB 2x10   2/7  Recumbent bike x5 min full  Patellar mobs grade III all directions PROM L knee  Long sit HSS 30sec x3 Supine SLR 2x10 Bridge 2x10 LAQ 5" 2x10 Squats 2x10  TKE with ball at wall 5" x10 SLS 3x10sec with UE support on rail    2/5  Recumbent bike x5 min (half revolutions today)  Patellar mobs grade III all directions Scar tissue massage and edu about self massage to increase scar length   Heel prop quad set with calf stretch 2s 2x10 Supine knee ext 2x10 2s holds  Bridge 2x10 STS from mid thigh surface 2x10     1/31  Manual: All PROM performed with distraction to reduce pain and improve movement; Grade II and III PA femoral glides; patella mobilization with education on self mobilization.   There-ex: Quad set 15x  Reviewed hamstring stretch for home.  LAQ 3x12 reviewed RPE for loading 1st set reported at 2/10  2nd set loaded      Nuro-Re-ed  Standing heel/toe for involuntary quad fire 2x12 Standing slow march  x10 to improve weight bearing and coordination of muscle firing.     1/29  Recumbent bike x5 min  PROM L knee  Grade II TKE mob L knee STM L HS (not tolerable after first few minutes) HS stretch with strap 30s; HS stretch on table 30s 2x Sidestepping at rail 3s laps HS iso 5s (too painful) Seated LAQ 20x    1/24 PROM L knee  SAQ 3" 2x10 SLR 2x5 LAQ 3x10 Standing HR 20x Standing marches 2x10 Partial squats x10 Recumbent bike x4 min rocking    1/22  PROM ext and flexion to tolerance  Heel prop quad set with calf stretch 2s 2x10 SLR 2x10 LAQ 3x10 Standing HR 20x  Gait with SPC 186ft     Treatment                            03/26/23:  Supine SAQ over towel roll Heel prop on towel roll- quad set  to SLR Heel slide with strap- hold 3 deep breaths Standing glut set Standing gastroc stretch Gait with glut set at heel strike Recumb bike.    1/16: PROM flexion and extension SAQ x10 LAQ 2x10 Gait in hall with FWW x55ft Standing heel raise 2x10 Partial squats x10   PATIENT EDUCATION:  Education details: surgical precautions, diagnosis, prognosis, anatomy, exercise progression, DOMS expectations, muscle firing,  envelope of function, HEP, POC Person educated: Patient Education method: Explanation, Demonstration, Tactile cues, Verbal cues, and Handouts Education comprehension: verbalized understanding, returned demonstration, verbal cues required, and tactile cues required  HOME EXERCISE PROGRAM:  Access Code: [email or web address] URL: [email or web address]  ASSESSMENT:  CLINICAL IMPRESSION:  Pt with more medial HS tension today with stretching. Posterior knee soft tissue limiting knee extension still. Pt quad strength has improved at this time and she is table to tolerate more CKC. Gait quality is greatly improved and she is able to ambulate independently on level surfaces but prefers to have [facility] for outdoor/community area. Plan to continue with knee ROM focus at this time. Flexion is 115 and knee ext is at -3 today.    Eval: Patient is a 68 y.o.  female who was seen today for physical therapy evaluation and treatment for c/c of L TKA. Pt's pain is highly sensitive and irritable with movement. Pt has very good knee flexion for 3 days post op but is more limited with extension as expected. Pt has been positioning to avoid knee flexion contracture but advised to actively perform heel propped quad sets and passive knee ext sitting more frequently during the day. Pt has been active and been performing hourly movement and taking blood thinners for clot prevention. No signs of infection. Plan to continue with L knee ROM and quad activation at future sessions. Consider repeat of heel  propped quad sets, standing TKE, and low grade joint mobs at next. Pt would benefit from continued skilled therapy in order to reach goals and maximize functional L LE strength and ROM for return to normal ADL and community ambulation.   OBJECTIVE IMPAIRMENTS decreased mobility, difficulty walking, decreased ROM, decreased strength, increased muscle spasms, improper body mechanics, postural dysfunction, and pain.    ACTIVITY LIMITATIONS lifting, sitting, standing, squatting, stairs, transfers, locomotion level   PARTICIPATION LIMITATIONS: cleaning, laundry, driving, shopping, community activity, occupation, yard work, and exercise   PERSONAL FACTORS Age, Fitness, Time since onset of injury/illness/exacerbation, and 2+ comorbidity:    are also affecting patient's functional outcome.    REHAB POTENTIAL: Good   CLINICAL DECISION MAKING: unstable/complicated   EVALUATION COMPLEXITY: Moderate     GOALS:     SHORT TERM GOALS: Target date: 04/28/2023    Pt will become independent with HEP in order to demonstrate synthesis of PT education..   Goal status: INITIAL   2.  Pt will score at least 19 pt increase on FOTO to demonstrate functional improvement in MCII and pt perceived function.      Goal status: INITIAL   3.  Pt will be able to demonstrate 0 deg of knee ext and 120 of knee flexion in order to demonstrate functional improvement in LE function for self-care and house hold duties.     Goal status: INITIAL  .  Pt will be able to demonstrate ability to walk independently for household distances in order to demonstrate functional improvement in LE function for self-care and house hold duties.     Goal status: INITIAL     LONG TERM GOALS: Target date: 06/09/2023     Pt  will become independent with final HEP in order to demonstrate synthesis of PT education.   Goal status: INITIAL   2.  Pt will score >/= 47 on FOTO to demonstrate improvement in perceived L knee function.     Goal status: INITIAL   3.  Pt will be able to demonstrate reciprocal stair management with single UE assist in order to demonstrate functional improvement in LE function for self-care and house hold mobility.   Goal status: INITIAL   4.  Pt will be able to demonstrate/report ability to walk >20 mins without pain in order to demonstrate functional improvement and tolerance to exercise and community mobility.    Goal status: INITIAL       PLAN: PT FREQUENCY: 1-2x/week   PT DURATION: 12 weeks (likely DC in 8)   PLANNED INTERVENTIONS: Therapeutic exercises, Therapeutic activity, Neuromuscular re-education, Balance training, Gait training, Patient/Family education, Self Care, and Joint mobilization   PLAN FOR NEXT SESSION: continue per TKA protocol focus on promoting knee extension    Zebedee Iba, PT 04/20/2023, 2:50 PM

## 2023-04-22 ENCOUNTER — Ambulatory Visit (HOSPITAL_BASED_OUTPATIENT_CLINIC_OR_DEPARTMENT_OTHER): Payer: Medicare Other

## 2023-04-22 ENCOUNTER — Encounter (HOSPITAL_BASED_OUTPATIENT_CLINIC_OR_DEPARTMENT_OTHER): Payer: Self-pay

## 2023-04-22 DIAGNOSIS — R293 Abnormal posture: Secondary | ICD-10-CM | POA: Diagnosis not present

## 2023-04-22 DIAGNOSIS — M6281 Muscle weakness (generalized): Secondary | ICD-10-CM | POA: Diagnosis not present

## 2023-04-22 DIAGNOSIS — M25662 Stiffness of left knee, not elsewhere classified: Secondary | ICD-10-CM | POA: Diagnosis not present

## 2023-04-22 DIAGNOSIS — M25562 Pain in left knee: Secondary | ICD-10-CM | POA: Diagnosis not present

## 2023-04-22 DIAGNOSIS — R262 Difficulty in walking, not elsewhere classified: Secondary | ICD-10-CM | POA: Diagnosis not present

## 2023-04-22 DIAGNOSIS — Z5189 Encounter for other specified aftercare: Secondary | ICD-10-CM | POA: Diagnosis not present

## 2023-04-22 NOTE — Therapy (Signed)
OUTPATIENT PHYSICAL THERAPY LOWER EXTREMITY TREATMENT    Patient Name: Sheri Burns MRN: [id number] DOB:11/14/1955, 68 y.o., female Today's Date: 04/22/2023  END OF SESSION:  PT End of Session - 04/22/23 1436     Visit Number 11    Number of Visits 19    Date for PT Re-Evaluation 06/15/23    Authorization Type BCBS MCR    PT Start Time 1433    PT Stop Time 1515    PT Time Calculation (min) 42 min    Activity Tolerance Patient tolerated treatment well    Behavior During Therapy WFL for tasks assessed/performed                     Past Medical History:  Diagnosis Date   Anemia    Bulging lumbar disc 1980   Chronic pansinusitis    Colon polyp    GERD (gastroesophageal reflux disease)    Gynecological examination    sees Dr. Aram Beecham Romine    Hypertension    Multiple sclerosis (HCC) since 1992   Dr. Despina Arias in [facility]   Multiple thyroid nodules 1995   per pt. resolved on its own follwed by endo then released   Neurogenic bladder disorder 12/2009   w/ urge [facility] stress [facility] dx. Dr. McDiarmid   Neuropathy    Osteoarthritis    Osteoporosis    last DEXA 2018   Overactive bladder    sees Dr. Lorin Picket McDiarmid    Pancreatitis 1977   Ulcerative proctitis [facility])    Vision abnormalities    Past Surgical History:  Procedure Laterality Date   APPENDECTOMY     breast ruptured  left   ductal cyst   CARPAL TUNNEL RELEASE Right    CHOLECYSTECTOMY     COLONOSCOPY  01/10/2009   repeat in 5 yrs Dr. Charlott Rakes   CYSTOSCOPY WITH BIOPSY N/A 01/06/2022   Procedure: CYSTOSCOPY WITH BLADDER/URETHRAL BIOPSY;  Surgeon: Sebastian Ache, MD;  Location: [facility];  Service: Urology;  Laterality: N/A;  45 MINS   DILATION AND CURETTAGE OF UTERUS     LAPAROSCOPY     x 3   left knee surgery  1978   to remove bone tumor     NASAL SINUS SURGERY Bilateral 01/13/2015   Procedure: ENDOSCOPIC SINUS SURGERY;  Surgeon: Serena Colonel, MD;  [facility];  Service: ENT;  Laterality: Bilateral;   NASAL SINUS SURGERY  05/2017   NASAL TURBINATE REDUCTION     pubo vaginal sling  10/2003   SPINAL FUSION     T7-S1 2023   TONSILLECTOMY     TOTAL KNEE ARTHROPLASTY Left 03/14/2023   Procedure: TOTAL KNEE ARTHROPLASTY;  Surgeon: Ollen Gross, MD;  Location: [facility];  Service: Orthopedics;  Laterality: Left;   vaginal cystocele repair     anterior   VAGINAL HYSTERECTOMY  1995   AUB & Endometriosis   widom teeth removal     Patient Active Problem List   Diagnosis Date Noted   Osteoarthritis of left knee 03/14/2023   Vertigo 08/05/2021   Chronic prescription opiate use 12/14/2018   Carpal tunnel syndrome, bilateral 09/13/2017   Sacroiliac joint pain 09/13/2017   Urinary urgency 06/22/2017   Sciatica, right side 06/17/2016   Dysesthesia 10/30/2015   Right carpal tunnel syndrome 10/30/2015   Sciatica of left side 07/16/2015   Chronic ethmoidal sinusitis 06/26/2015   High risk medication use 07/08/2014   Chronic fatigue 04/02/2014   Dysphagia 04/02/2014  Ataxic gait 04/02/2014   Vitamin D deficiency 06/08/2012   OVERACTIVE BLADDER 08/01/2009   Osteoporosis 08/01/2009   Depression with anxiety 07/04/2007   MULTIPLE SCLEROSIS 07/04/2007   OA (osteoarthritis) of knee 07/04/2007   Headache 07/04/2007   History of colonic polyps 07/04/2007    PCP: Thana Ates, MD   REFERRING PROVIDER:  Ollen Gross, MD      REFERRING DIAG: Z51.89 (ICD-10-CM) - Encounter for other specified aftercare     THERAPY DIAG:  Pain in joint of left knee  Muscle weakness (generalized)  Difficulty walking  Posture abnormality  Stiffness of left knee, not elsewhere classified  Rationale for Evaluation and Treatment: Rehabilitation  ONSET DATE: 03/13/2022 DOS  L TKA  SUBJECTIVE:   SUBJECTIVE STATEMENT:  Pt reports she saw MD this morning for f/u and was pleased with her progress. Arrives without AD.    PERTINENT HISTORY: 2/23 fusion  of T7-S1 for scoliosis; L knee surgery medial condyle excision for osteochondritis desiccans; L meniscus tear; MS relapsing remitting  PAIN:  Are you having pain? Yes: NPRS scale: 4/10 Pain location: L ant and post knee Pain description: mostly burning Aggravating factors: movement, WB, flexion and ext Relieving factors: resting, sitting   PRECAUTIONS: L knee  WEIGHT BEARING RESTRICTIONS: WBAT  FALLS:  Has patient fallen in last 6 months? No  LIVING ENVIRONMENT: Lives with: lives alone Lives in: House/apartment Stairs: No Has following equipment at home: [facility] and walker, does not use either   OCCUPATION: ICU nurse, retired   PLOF: Independent with basic ADLs  PATIENT GOALS: return to normal walking  Next visit: 2 wk post op appt  OBJECTIVE:   DIAGNOSTIC FINDINGS: N/A post op  PATIENT SURVEYS:   FOTO 18 47 @ DC 19 visits 19 MCII    COGNITION: Overall cognitive status: Within functional limits for tasks assessed     SENSATION: Light touch impaired at anterior knee and lower leg on L  EDEMA:  N/A   PALPATION: TTP of anterior and posterior knee  Incision site well appearing with Aquacell in place; no erythema or excessive saturation of the bandage  LOWER EXTREMITY ROM:  Active ROM Right eval Left eval Lt 1/18 Left L  2/12  Knee flexion 118 89 100 105 115  Knee extension -3 -29 -12 -10 with pain  -3   (Blank rows = not tested)  LOWER EXTREMITY MMT: 4/5 of L knee  FUNCTIONAL TESTS:  Transfers: STS requires UE  Requires UE and opp LE assisted for moving surgical limb  GAIT: Distance walked: 43ft Assistive device utilized: None Level of assistance: indepent Comments: L flexed knee, antalgic,lack of heel strike and toe off   TODAY'S TREATMENT:                                                                                                                              DATE:  Treatment  2/14: Blank lines following charge  title = not provided on this treatment date.   Manual:  TPDN No  There-ex: PROM LAQ 2# 5" 2x10 Seated HSC GTB 2x10 Standing hip abduction 2# 2x10ea HR/TR 2x10 There-Act: Squats 2x10 Step ups 4" 2x10 Standing march 2x10 Self Care:  Nuro-Re-ed:  Gait Training:     2/12  Recumbent bike x5 min full  Patellar mobs grade III all directions Standing gastroc stretch 30s 3x Side stepping RTB 3x  RTB TKE 3x10 Standing adductor/medial HS stretch 30s 3x  Seated knee ext YTB 2x10   2/7  Recumbent bike x5 min full  Patellar mobs grade III all directions PROM L knee  Long sit HSS 30sec x3 Supine SLR 2x10 Bridge 2x10 LAQ 5" 2x10 Squats 2x10  TKE with ball at wall 5" x10 SLS 3x10sec with UE support on rail    2/5  Recumbent bike x5 min (half revolutions today)  Patellar mobs grade III all directions Scar tissue massage and edu about self massage to increase scar length   Heel prop quad set with calf stretch 2s 2x10 Supine knee ext 2x10 2s holds  Bridge 2x10 STS from mid thigh surface 2x10    PATIENT EDUCATION:  Education details: surgical precautions, diagnosis, prognosis, anatomy, exercise progression, DOMS expectations, muscle firing,  envelope of function, HEP, POC Person educated: Patient Education method: Explanation, Demonstration, Tactile cues, Verbal cues, and Handouts Education comprehension: verbalized understanding, returned demonstration, verbal cues required, and tactile cues required  HOME EXERCISE PROGRAM:  Access Code: [email or web address] URL: [email or web address]  ASSESSMENT:  CLINICAL IMPRESSION:  Pt with continued stiffness into end range knee flexion and extension. Soreness following PROM today. Instructed pt to use ice when she gets home to manage this. She has good tolerance for progressions to therex and therAct, though some discomfort present. Will continue to progress as tolerated towards goals.    Eval: Patient is a 68  y.o. female who was seen today for physical therapy evaluation and treatment for c/c of L TKA. Pt's pain is highly sensitive and irritable with movement. Pt has very good knee flexion for 3 days post op but is more limited with extension as expected. Pt has been positioning to avoid knee flexion contracture but advised to actively perform heel propped quad sets and passive knee ext sitting more frequently during the day. Pt has been active and been performing hourly movement and taking blood thinners for clot prevention. No signs of infection. Plan to continue with L knee ROM and quad activation at future sessions. Consider repeat of heel propped quad sets, standing TKE, and low grade joint mobs at next. Pt would benefit from continued skilled therapy in order to reach goals and maximize functional L LE strength and ROM for return to normal ADL and community ambulation.   OBJECTIVE IMPAIRMENTS decreased mobility, difficulty walking, decreased ROM, decreased strength, increased muscle spasms, improper body mechanics, postural dysfunction, and pain.    ACTIVITY LIMITATIONS lifting, sitting, standing, squatting, stairs, transfers, locomotion level   PARTICIPATION LIMITATIONS: cleaning, laundry, driving, shopping, community activity, occupation, yard work, and exercise   PERSONAL FACTORS Age, Fitness, Time since onset of injury/illness/exacerbation, and 2+ comorbidity:    are also affecting patient's functional outcome.    REHAB POTENTIAL: Good   CLINICAL DECISION MAKING: unstable/complicated   EVALUATION COMPLEXITY: Moderate     GOALS:     SHORT TERM GOALS: Target date: 04/28/2023    Pt will become independent with HEP in order to demonstrate synthesis of  PT education..   Goal status: INITIAL   2.  Pt will score at least 19 pt increase on FOTO to demonstrate functional improvement in MCII and pt perceived function.      Goal status: INITIAL   3.  Pt will be able to demonstrate 0 deg of  knee ext and 120 of knee flexion in order to demonstrate functional improvement in LE function for self-care and house hold duties.     Goal status: INITIAL  .  Pt will be able to demonstrate ability to walk independently for household distances in order to demonstrate functional improvement in LE function for self-care and house hold duties.     Goal status: INITIAL     LONG TERM GOALS: Target date: 06/09/2023     Pt  will become independent with final HEP in order to demonstrate synthesis of PT education.   Goal status: INITIAL   2.  Pt will score >/= 47 on FOTO to demonstrate improvement in perceived L knee function.    Goal status: INITIAL   3.  Pt will be able to demonstrate reciprocal stair management with single UE assist in order to demonstrate functional improvement in LE function for self-care and house hold mobility.   Goal status: INITIAL   4.  Pt will be able to demonstrate/report ability to walk >20 mins without pain in order to demonstrate functional improvement and tolerance to exercise and community mobility.    Goal status: INITIAL       PLAN: PT FREQUENCY: 1-2x/week   PT DURATION: 12 weeks (likely DC in 8)   PLANNED INTERVENTIONS: Therapeutic exercises, Therapeutic activity, Neuromuscular re-education, Balance training, Gait training, Patient/Family education, Self Care, and Joint mobilization   PLAN FOR NEXT SESSION: continue per TKA protocol focus on promoting knee extension    Donnel Saxon Lavoy Bernards, PTA 04/22/2023, 3:39 PM

## 2023-04-27 ENCOUNTER — Encounter (HOSPITAL_BASED_OUTPATIENT_CLINIC_OR_DEPARTMENT_OTHER): Payer: Self-pay

## 2023-04-27 ENCOUNTER — Ambulatory Visit (HOSPITAL_BASED_OUTPATIENT_CLINIC_OR_DEPARTMENT_OTHER): Payer: Medicare Other

## 2023-04-27 DIAGNOSIS — M6281 Muscle weakness (generalized): Secondary | ICD-10-CM | POA: Diagnosis not present

## 2023-04-27 DIAGNOSIS — R262 Difficulty in walking, not elsewhere classified: Secondary | ICD-10-CM

## 2023-04-27 DIAGNOSIS — M25562 Pain in left knee: Secondary | ICD-10-CM | POA: Diagnosis not present

## 2023-04-27 DIAGNOSIS — R293 Abnormal posture: Secondary | ICD-10-CM | POA: Diagnosis not present

## 2023-04-27 DIAGNOSIS — M25662 Stiffness of left knee, not elsewhere classified: Secondary | ICD-10-CM

## 2023-04-27 NOTE — Therapy (Signed)
 OUTPATIENT PHYSICAL THERAPY LOWER EXTREMITY TREATMENT    Patient Name: Sheri Burns MRN: [id number] DOB:August 18, 1955, 68 y.o., female Today's Date: 04/27/2023  END OF SESSION:  PT End of Session - 04/27/23 1109     Visit Number 12    Number of Visits 19    Date for PT Re-Evaluation 06/15/23    Authorization Type BCBS MCR    PT Start Time 1103    PT Stop Time 1149    PT Time Calculation (min) 46 min    Activity Tolerance Patient tolerated treatment well    Behavior During Therapy WFL for tasks assessed/performed                      Past Medical History:  Diagnosis Date   Anemia    Bulging lumbar disc 1980   Chronic pansinusitis    Colon polyp    GERD (gastroesophageal reflux disease)    Gynecological examination    sees Dr. Aram Beecham Romine    Hypertension    Multiple sclerosis (HCC) since 1992   Dr. Despina Arias in [facility]   Multiple thyroid nodules 1995   per pt. resolved on its own follwed by endo then released   Neurogenic bladder disorder 12/2009   w/ urge & stress inc. dx. Dr. McDiarmid   Neuropathy    Osteoarthritis    Osteoporosis    last DEXA 2018   Overactive bladder    sees Dr. Lorin Picket McDiarmid    Pancreatitis 1977   Ulcerative proctitis [facility])    Vision abnormalities    Past Surgical History:  Procedure Laterality Date   APPENDECTOMY     breast ruptured  left   ductal cyst   CARPAL TUNNEL RELEASE Right    CHOLECYSTECTOMY     COLONOSCOPY  01/10/2009   repeat in 5 yrs Dr. Charlott Rakes   CYSTOSCOPY WITH BIOPSY N/A 01/06/2022   Procedure: CYSTOSCOPY WITH BLADDER/URETHRAL BIOPSY;  Surgeon: Sebastian Ache, MD;  Location: [facility];  Service: Urology;  Laterality: N/A;  45 MINS   DILATION AND CURETTAGE OF UTERUS     LAPAROSCOPY     x 3   left knee surgery  1978   to remove bone tumor     NASAL SINUS SURGERY Bilateral 01/13/2015   Procedure: ENDOSCOPIC SINUS SURGERY;  Surgeon: Serena Colonel, MD;  Location: [facility];  Service: ENT;  Laterality: Bilateral;   NASAL SINUS SURGERY  05/2017   NASAL TURBINATE REDUCTION     pubo vaginal sling  10/2003   SPINAL FUSION     T7-S1 2023   TONSILLECTOMY     TOTAL KNEE ARTHROPLASTY Left 03/14/2023   Procedure: TOTAL KNEE ARTHROPLASTY;  Surgeon: Ollen Gross, MD;  Location: [facility];  Service: Orthopedics;  Laterality: Left;   vaginal cystocele repair     anterior   VAGINAL HYSTERECTOMY  1995   AUB & Endometriosis   widom teeth removal     Patient Active Problem List   Diagnosis Date Noted   Osteoarthritis of left knee 03/14/2023   Vertigo 08/05/2021   Chronic prescription opiate use 12/14/2018   Carpal tunnel syndrome, bilateral 09/13/2017   Sacroiliac joint pain 09/13/2017   Urinary urgency 06/22/2017   Sciatica, right side 06/17/2016   Dysesthesia 10/30/2015   Right carpal tunnel syndrome 10/30/2015   Sciatica of left side 07/16/2015   Chronic ethmoidal sinusitis 06/26/2015   High risk medication use 07/08/2014   Chronic fatigue 04/02/2014   Dysphagia  04/02/2014   Ataxic gait 04/02/2014   Vitamin D deficiency 06/08/2012   OVERACTIVE BLADDER 08/01/2009   Osteoporosis 08/01/2009   Depression with anxiety 07/04/2007   MULTIPLE SCLEROSIS 07/04/2007   OA (osteoarthritis) of knee 07/04/2007   Headache 07/04/2007   History of colonic polyps 07/04/2007    PCP: Thana Ates, MD   REFERRING PROVIDER:  Ollen Gross, MD      REFERRING DIAG: Z51.89 (ICD-10-CM) - Encounter for other specified aftercare     THERAPY DIAG:  Pain in joint of left knee  Muscle weakness (generalized)  Posture abnormality  Difficulty walking  Stiffness of left knee, not elsewhere classified  Rationale for Evaluation and Treatment: Rehabilitation  ONSET DATE: 03/13/2022 DOS  L TKA  SUBJECTIVE:   SUBJECTIVE STATEMENT:  Pt reports she is doing well without cane but brought with her as she gets sore when leaving PT. Has ben on feet more than usual which  has increased her pain level. Feels pain more medially today.   PERTINENT HISTORY: 2/23 fusion of T7-S1 for scoliosis; L knee surgery medial condyle excision for osteochondritis desiccans; L meniscus tear; MS relapsing remitting  PAIN:  Are you having pain? Yes: NPRS scale: 4/10 Pain location: L ant and post knee Pain description: mostly burning Aggravating factors: movement, WB, flexion and ext Relieving factors: resting, sitting   PRECAUTIONS: L knee  WEIGHT BEARING RESTRICTIONS: WBAT  FALLS:  Has patient fallen in last 6 months? No  LIVING ENVIRONMENT: Lives with: lives alone Lives in: House/apartment Stairs: No Has following equipment at home: [facility] and walker, does not use either   OCCUPATION: ICU nurse, retired   PLOF: Independent with basic ADLs  PATIENT GOALS: return to normal walking  Next visit: 2 wk post op appt  OBJECTIVE:   DIAGNOSTIC FINDINGS: N/A post op  PATIENT SURVEYS:   FOTO 18 47 @ DC 19 visits 19 MCII  FOTO Knee: 54% on 2/19 (MET GOAL)   COGNITION: Overall cognitive status: Within functional limits for tasks assessed     SENSATION: Light touch impaired at anterior knee and lower leg on L  EDEMA:  N/A   PALPATION: TTP of anterior and posterior knee  Incision site well appearing with Aquacell in place; no erythema or excessive saturation of the bandage  LOWER EXTREMITY ROM:  Active ROM Right eval Left eval Lt 1/18 Left L  2/12  Knee flexion 118 89 100 105 115  Knee extension -3 -29 -12 -10 with pain  -3   (Blank rows = not tested)  LOWER EXTREMITY MMT: 4/5 of L knee  FUNCTIONAL TESTS:  Transfers: STS requires UE  Requires UE and opp LE assisted for moving surgical limb  GAIT: Distance walked: 72ft Assistive device utilized: None Level of assistance: indepent Comments: L flexed knee, antalgic,lack of heel strike and toe off   TODAY'S TREATMENT:  DATE:  Treatment                            2/19: Blank lines following charge title = not provided on this treatment date.   Manual:  TPDN No  There-ex: PROM LAQ 2# 5" 3x10 Seated HSC GTB 2x10 Standing hip abduction 2# 2x10ea HR/TR 2x10 There-Act: Squats 2x10 Step ups 4" 2x10 Standing march x10    Treatment                            2/14: Blank lines following charge title = not provided on this treatment date.   Manual:  TPDN No PROM Tib/fem mobilizations with posterior glide Patella mobilizations There-ex: LAQ 2# 5" 2x10 Seated HSC GTB 2x10 Standing hip abduction 2# 2x10ea HR/TR 2x10 There-Act: Squats 2x10 Step ups 4" 2x10 Standing march 2x10    2/12  Recumbent bike x5 min full  Patellar mobs grade III all directions Standing gastroc stretch 30s 3x Side stepping RTB 3x  RTB TKE 3x10 Standing adductor/medial HS stretch 30s 3x  Seated knee ext YTB 2x10   2/7  Recumbent bike x5 min full  Patellar mobs grade III all directions PROM L knee  Long sit HSS 30sec x3 Supine SLR 2x10 Bridge 2x10 LAQ 5" 2x10 Squats 2x10  TKE with ball at wall 5" x10 SLS 3x10sec with UE support on rail    2/5  Recumbent bike x5 min (half revolutions today)  Patellar mobs grade III all directions Scar tissue massage and edu about self massage to increase scar length   Heel prop quad set with calf stretch 2s 2x10 Supine knee ext 2x10 2s holds  Bridge 2x10 STS from mid thigh surface 2x10    PATIENT EDUCATION:  Education details: surgical precautions, diagnosis, prognosis, anatomy, exercise progression, DOMS expectations, muscle firing,  envelope of function, HEP, POC Person educated: Patient Education method: Explanation, Demonstration, Tactile cues, Verbal cues, and Handouts Education comprehension: verbalized understanding, returned demonstration, verbal cues required, and tactile cues required  HOME  EXERCISE PROGRAM:  Access Code: [email or web address] URL: [email or web address]  ASSESSMENT:  CLINICAL IMPRESSION:  Pt with ongoing stiffness into knee flexion and end range knee extension. C/o increased medial knee discomfort throughout session today. Cued her to decrease range with squats to avoid pain. Felt increased medial knee pain with marching when raising L LE. Pt has met FOTO goal today. Will continue to progress ROM and strength in order to improve her functional level.    Eval: Patient is a 68 y.o. female who was seen today for physical therapy evaluation and treatment for c/c of L TKA. Pt's pain is highly sensitive and irritable with movement. Pt has very good knee flexion for 3 days post op but is more limited with extension as expected. Pt has been positioning to avoid knee flexion contracture but advised to actively perform heel propped quad sets and passive knee ext sitting more frequently during the day. Pt has been active and been performing hourly movement and taking blood thinners for clot prevention. No signs of infection. Plan to continue with L knee ROM and quad activation at future sessions. Consider repeat of heel propped quad sets, standing TKE, and low grade joint mobs at next. Pt would benefit from continued skilled therapy in order to reach goals and maximize functional L LE strength and ROM for return to normal ADL and community ambulation.   OBJECTIVE  IMPAIRMENTS decreased mobility, difficulty walking, decreased ROM, decreased strength, increased muscle spasms, improper body mechanics, postural dysfunction, and pain.    ACTIVITY LIMITATIONS lifting, sitting, standing, squatting, stairs, transfers, locomotion level   PARTICIPATION LIMITATIONS: cleaning, laundry, driving, shopping, community activity, occupation, yard work, and exercise   PERSONAL FACTORS Age, Fitness, Time since onset of injury/illness/exacerbation, and 2+ comorbidity:    are also affecting  patient's functional outcome.    REHAB POTENTIAL: Good   CLINICAL DECISION MAKING: unstable/complicated   EVALUATION COMPLEXITY: Moderate     GOALS:     SHORT TERM GOALS: Target date: 04/28/2023    Pt will become independent with HEP in order to demonstrate synthesis of PT education..   Goal status: INITIAL   2.  Pt will score at least 19 pt increase on FOTO to demonstrate functional improvement in MCII and pt perceived function.      Goal status: MET 2/19   3.  Pt will be able to demonstrate 0 deg of knee ext and 120 of knee flexion in order to demonstrate functional improvement in LE function for self-care and house hold duties.     Goal status: IN PROGRESS  .  Pt will be able to demonstrate ability to walk independently for household distances in order to demonstrate functional improvement in LE function for self-care and house hold duties.     Goal status: IN PROGRESS     LONG TERM GOALS: Target date: 06/09/2023     Pt  will become independent with final HEP in order to demonstrate synthesis of PT education.   Goal status: INITIAL   2.  Pt will score >/= 47 on FOTO to demonstrate improvement in perceived L knee function.    Goal status: MET 2/19   3.  Pt will be able to demonstrate reciprocal stair management with single UE assist in order to demonstrate functional improvement in LE function for self-care and house hold mobility.   Goal status: INITIAL   4.  Pt will be able to demonstrate/report ability to walk >20 mins without pain in order to demonstrate functional improvement and tolerance to exercise and community mobility.    Goal status: INITIAL       PLAN: PT FREQUENCY: 1-2x/week   PT DURATION: 12 weeks (likely DC in 8)   PLANNED INTERVENTIONS: Therapeutic exercises, Therapeutic activity, Neuromuscular re-education, Balance training, Gait training, Patient/Family education, Self Care, and Joint mobilization   PLAN FOR NEXT SESSION: continue per  TKA protocol focus on promoting knee extension    Donnel Saxon Jakhia Buxton, PTA 04/27/2023, 12:00 PM

## 2023-04-29 ENCOUNTER — Encounter (HOSPITAL_BASED_OUTPATIENT_CLINIC_OR_DEPARTMENT_OTHER): Payer: Self-pay

## 2023-04-29 ENCOUNTER — Ambulatory Visit (HOSPITAL_BASED_OUTPATIENT_CLINIC_OR_DEPARTMENT_OTHER): Payer: Medicare Other

## 2023-04-29 DIAGNOSIS — R293 Abnormal posture: Secondary | ICD-10-CM

## 2023-04-29 DIAGNOSIS — M25562 Pain in left knee: Secondary | ICD-10-CM

## 2023-04-29 DIAGNOSIS — M25662 Stiffness of left knee, not elsewhere classified: Secondary | ICD-10-CM | POA: Diagnosis not present

## 2023-04-29 DIAGNOSIS — R262 Difficulty in walking, not elsewhere classified: Secondary | ICD-10-CM

## 2023-04-29 DIAGNOSIS — M6281 Muscle weakness (generalized): Secondary | ICD-10-CM | POA: Diagnosis not present

## 2023-04-29 NOTE — Therapy (Signed)
 OUTPATIENT PHYSICAL THERAPY LOWER EXTREMITY TREATMENT    Patient Name: Sheri Burns MRN: [id number] DOB:10-07-1955, 68 y.o., female Today's Date: 04/29/2023  END OF SESSION:  PT End of Session - 04/29/23 1458     Visit Number 13    Number of Visits 19    Date for PT Re-Evaluation 06/15/23    Authorization Type BCBS MCR    PT Start Time 1423    PT Stop Time 1507    PT Time Calculation (min) 44 min    Activity Tolerance Patient tolerated treatment well    Behavior During Therapy WFL for tasks assessed/performed                       Past Medical History:  Diagnosis Date   Anemia    Bulging lumbar disc 1980   Chronic pansinusitis    Colon polyp    GERD (gastroesophageal reflux disease)    Gynecological examination    sees Dr. Aram Beecham Romine    Hypertension    Multiple sclerosis (HCC) since 1992   Dr. Despina Arias in [facility]   Multiple thyroid nodules 1995   per pt. resolved on its own follwed by endo then released   Neurogenic bladder disorder 12/2009   w/ urge & stress inc. dx. Dr. McDiarmid   Neuropathy    Osteoarthritis    Osteoporosis    last DEXA 2018   Overactive bladder    sees Dr. Lorin Picket McDiarmid    Pancreatitis 1977   Ulcerative proctitis [facility])    Vision abnormalities    Past Surgical History:  Procedure Laterality Date   APPENDECTOMY     breast ruptured  left   ductal cyst   CARPAL TUNNEL RELEASE Right    CHOLECYSTECTOMY     COLONOSCOPY  01/10/2009   repeat in 5 yrs Dr. Charlott Rakes   CYSTOSCOPY WITH BIOPSY N/A 01/06/2022   Procedure: CYSTOSCOPY WITH BLADDER/URETHRAL BIOPSY;  Surgeon: Sebastian Ache, MD;  Location: [facility];  Service: Urology;  Laterality: N/A;  45 MINS   DILATION AND CURETTAGE OF UTERUS     LAPAROSCOPY     x 3   left knee surgery  1978   to remove bone tumor     NASAL SINUS SURGERY Bilateral 01/13/2015   Procedure: ENDOSCOPIC SINUS SURGERY;  Surgeon: Serena Colonel, MD;  Location: [facility];  Service: ENT;  Laterality: Bilateral;   NASAL SINUS SURGERY  05/2017   NASAL TURBINATE REDUCTION     pubo vaginal sling  10/2003   SPINAL FUSION     T7-S1 2023   TONSILLECTOMY     TOTAL KNEE ARTHROPLASTY Left 03/14/2023   Procedure: TOTAL KNEE ARTHROPLASTY;  Surgeon: Ollen Gross, MD;  Location: [facility];  Service: Orthopedics;  Laterality: Left;   vaginal cystocele repair     anterior   VAGINAL HYSTERECTOMY  1995   AUB & Endometriosis   widom teeth removal     Patient Active Problem List   Diagnosis Date Noted   Osteoarthritis of left knee 03/14/2023   Vertigo 08/05/2021   Chronic prescription opiate use 12/14/2018   Carpal tunnel syndrome, bilateral 09/13/2017   Sacroiliac joint pain 09/13/2017   Urinary urgency 06/22/2017   Sciatica, right side 06/17/2016   Dysesthesia 10/30/2015   Right carpal tunnel syndrome 10/30/2015   Sciatica of left side 07/16/2015   Chronic ethmoidal sinusitis 06/26/2015   High risk medication use 07/08/2014   Chronic fatigue 04/02/2014  Dysphagia 04/02/2014   Ataxic gait 04/02/2014   Vitamin D deficiency 06/08/2012   OVERACTIVE BLADDER 08/01/2009   Osteoporosis 08/01/2009   Depression with anxiety 07/04/2007   MULTIPLE SCLEROSIS 07/04/2007   OA (osteoarthritis) of knee 07/04/2007   Headache 07/04/2007   History of colonic polyps 07/04/2007    PCP: Thana Ates, MD   REFERRING PROVIDER:  Ollen Gross, MD      REFERRING DIAG: Z51.89 (ICD-10-CM) - Encounter for other specified aftercare     THERAPY DIAG:  Pain in joint of left knee  Posture abnormality  Muscle weakness (generalized)  Difficulty walking  Stiffness of left knee, not elsewhere classified  Rationale for Evaluation and Treatment: Rehabilitation  ONSET DATE: 03/13/2022 DOS  L TKA  SUBJECTIVE:   SUBJECTIVE STATEMENT:  Patient reports her knee is doing well overall."  I forget about it sometimes."  States she is now able to sleep on her left side more  comfortably.  PERTINENT HISTORY: 2/23 fusion of T7-S1 for scoliosis; L knee surgery medial condyle excision for osteochondritis desiccans; L meniscus tear; MS relapsing remitting  PAIN:  Are you having pain? Yes: NPRS scale: 4/10 Pain location: L ant and post knee Pain description: mostly burning Aggravating factors: movement, WB, flexion and ext Relieving factors: resting, sitting   PRECAUTIONS: L knee  WEIGHT BEARING RESTRICTIONS: WBAT  FALLS:  Has patient fallen in last 6 months? No  LIVING ENVIRONMENT: Lives with: lives alone Lives in: House/apartment Stairs: No Has following equipment at home: [facility] and walker, does not use either   OCCUPATION: ICU nurse, retired   PLOF: Independent with basic ADLs  PATIENT GOALS: return to normal walking  Next visit: 2 wk post op appt  OBJECTIVE:   DIAGNOSTIC FINDINGS: N/A post op  PATIENT SURVEYS:   FOTO 18 47 @ DC 19 visits 19 MCII  FOTO Knee: 54% on 2/19 (MET GOAL)   COGNITION: Overall cognitive status: Within functional limits for tasks assessed     SENSATION: Light touch impaired at anterior knee and lower leg on L  EDEMA:  N/A   PALPATION: TTP of anterior and posterior knee  Incision site well appearing with Aquacell in place; no erythema or excessive saturation of the bandage  LOWER EXTREMITY ROM:  Active ROM Right eval Left eval Lt 1/18 Left L  2/12  Knee flexion 118 89 100 105 115  Knee extension -3 -29 -12 -10 with pain  -3   (Blank rows = not tested)  LOWER EXTREMITY MMT: 4/5 of L knee  FUNCTIONAL TESTS:  Transfers: STS requires UE  Requires UE and opp LE assisted for moving surgical limb  GAIT: Distance walked: 13ft Assistive device utilized: None Level of assistance: indepent Comments: L flexed knee, antalgic,lack of heel strike and toe off   TODAY'S TREATMENT:  DATE:  Treatment                            2/21: Blank lines following charge title = not provided on this treatment date.   Manual:  TPDN No STM to distal adductors, HS, and quads  There-ex: Bike PROM LAQ 2# 5" 3x10 Seated HSC GTB 2x10 Standing hip abduction 2# 2x10ea HR/TR 2x10 There-Act: Squats 2x10 Step ups 4" 2x10 Standing march 2x10  Treatment                            2/19: Blank lines following charge title = not provided on this treatment date.   Manual:  TPDN No  There-ex: PROM LAQ 2# 5" 3x10 Seated HSC GTB 2x10 Standing hip abduction 2# 2x10ea HR/TR 2x10 There-Act: Squats 2x10 Step ups 4" 2x10 Standing march x10    Treatment                            2/14: Blank lines following charge title = not provided on this treatment date.   Manual:  TPDN No PROM Tib/fem mobilizations with posterior glide Patella mobilizations There-ex: LAQ 2# 5" 2x10 Seated HSC GTB 2x10 Standing hip abduction 2# 2x10ea HR/TR 2x10 There-Act: Squats 2x10 Step ups 4" 2x10 Standing march 2x10    2/12  Recumbent bike x5 min full  Patellar mobs grade III all directions Standing gastroc stretch 30s 3x Side stepping RTB 3x  RTB TKE 3x10 Standing adductor/medial HS stretch 30s 3x  Seated knee ext YTB 2x10   2/7  Recumbent bike x5 min full  Patellar mobs grade III all directions PROM L knee  Long sit HSS 30sec x3 Supine SLR 2x10 Bridge 2x10 LAQ 5" 2x10 Squats 2x10  TKE with ball at wall 5" x10 SLS 3x10sec with UE support on rail    2/5  Recumbent bike x5 min (half revolutions today)  Patellar mobs grade III all directions Scar tissue massage and edu about self massage to increase scar length   Heel prop quad set with calf stretch 2s 2x10 Supine knee ext 2x10 2s holds  Bridge 2x10 STS from mid thigh surface 2x10    PATIENT EDUCATION:  Education details: surgical precautions, diagnosis, prognosis, anatomy, exercise progression, DOMS  expectations, muscle firing,  envelope of function, HEP, POC Person educated: Patient Education method: Explanation, Demonstration, Tactile cues, Verbal cues, and Handouts Education comprehension: verbalized understanding, returned demonstration, verbal cues required, and tactile cues required  HOME EXERCISE PROGRAM:  Access Code: [email or web address] URL: [email or web address]  ASSESSMENT:  CLINICAL IMPRESSION:  Pt with ongoing stiffness into knee flexion and end range knee extension. Increased medial knee discomfort today. Tightness noted in distal adductors so spent time on STM to this area which helped mildly. Good tolerance for strengthening interventions.  Will continue working on increasing range of motion and functional strength level in clinic.   Eval: Patient is a 68 y.o. female who was seen today for physical therapy evaluation and treatment for c/c of L TKA. Pt's pain is highly sensitive and irritable with movement. Pt has very good knee flexion for 3 days post op but is more limited with extension as expected. Pt has been positioning to avoid knee flexion contracture but advised to actively perform heel propped quad sets and passive knee ext sitting more frequently during the day. Pt has been active  and been performing hourly movement and taking blood thinners for clot prevention. No signs of infection. Plan to continue with L knee ROM and quad activation at future sessions. Consider repeat of heel propped quad sets, standing TKE, and low grade joint mobs at next. Pt would benefit from continued skilled therapy in order to reach goals and maximize functional L LE strength and ROM for return to normal ADL and community ambulation.   OBJECTIVE IMPAIRMENTS decreased mobility, difficulty walking, decreased ROM, decreased strength, increased muscle spasms, improper body mechanics, postural dysfunction, and pain.    ACTIVITY LIMITATIONS lifting, sitting, standing, squatting, stairs,  transfers, locomotion level   PARTICIPATION LIMITATIONS: cleaning, laundry, driving, shopping, community activity, occupation, yard work, and exercise   PERSONAL FACTORS Age, Fitness, Time since onset of injury/illness/exacerbation, and 2+ comorbidity:    are also affecting patient's functional outcome.    REHAB POTENTIAL: Good   CLINICAL DECISION MAKING: unstable/complicated   EVALUATION COMPLEXITY: Moderate     GOALS:     SHORT TERM GOALS: Target date: 04/28/2023    Pt will become independent with HEP in order to demonstrate synthesis of PT education..   Goal status: INITIAL   2.  Pt will score at least 19 pt increase on FOTO to demonstrate functional improvement in MCII and pt perceived function.      Goal status: MET 2/19   3.  Pt will be able to demonstrate 0 deg of knee ext and 120 of knee flexion in order to demonstrate functional improvement in LE function for self-care and house hold duties.     Goal status: IN PROGRESS  .  Pt will be able to demonstrate ability to walk independently for household distances in order to demonstrate functional improvement in LE function for self-care and house hold duties.     Goal status: IN PROGRESS     LONG TERM GOALS: Target date: 06/09/2023     Pt  will become independent with final HEP in order to demonstrate synthesis of PT education.   Goal status: INITIAL   2.  Pt will score >/= 47 on FOTO to demonstrate improvement in perceived L knee function.    Goal status: MET 2/19   3.  Pt will be able to demonstrate reciprocal stair management with single UE assist in order to demonstrate functional improvement in LE function for self-care and house hold mobility.   Goal status: INITIAL   4.  Pt will be able to demonstrate/report ability to walk >20 mins without pain in order to demonstrate functional improvement and tolerance to exercise and community mobility.    Goal status: INITIAL       PLAN: PT FREQUENCY:  1-2x/week   PT DURATION: 12 weeks (likely DC in 8)   PLANNED INTERVENTIONS: Therapeutic exercises, Therapeutic activity, Neuromuscular re-education, Balance training, Gait training, Patient/Family education, Self Care, and Joint mobilization   PLAN FOR NEXT SESSION: continue per TKA protocol focus on promoting knee extension    Donnel Saxon Mikenzi Raysor, PTA 04/29/2023, 5:03 PM

## 2023-05-04 ENCOUNTER — Other Ambulatory Visit: Payer: Self-pay | Admitting: Neurology

## 2023-05-04 ENCOUNTER — Ambulatory Visit (HOSPITAL_BASED_OUTPATIENT_CLINIC_OR_DEPARTMENT_OTHER): Payer: Medicare Other | Admitting: Physical Therapy

## 2023-05-04 ENCOUNTER — Encounter (HOSPITAL_BASED_OUTPATIENT_CLINIC_OR_DEPARTMENT_OTHER): Payer: Self-pay | Admitting: Physical Therapy

## 2023-05-04 DIAGNOSIS — R293 Abnormal posture: Secondary | ICD-10-CM | POA: Diagnosis not present

## 2023-05-04 DIAGNOSIS — M6281 Muscle weakness (generalized): Secondary | ICD-10-CM

## 2023-05-04 DIAGNOSIS — M25562 Pain in left knee: Secondary | ICD-10-CM | POA: Diagnosis not present

## 2023-05-04 DIAGNOSIS — M25662 Stiffness of left knee, not elsewhere classified: Secondary | ICD-10-CM | POA: Diagnosis not present

## 2023-05-04 DIAGNOSIS — R262 Difficulty in walking, not elsewhere classified: Secondary | ICD-10-CM | POA: Diagnosis not present

## 2023-05-04 NOTE — Telephone Encounter (Signed)
 [facility] PROVIDER: PLEASE REFUSE IT WOULDN'T LET ME REFILL TO SOON  Last seen 02/23/23, next appt 09/14/23  Dispenses    Dispensed Days Supply Quantity Provider Pharmacy  dextroamphetamine-amphetamine 20 mg tablet 04/29/2023 30 60 each Sater, Pearletha Furl, MD [facility]  dextroamphetamine-amphetamine 20 mg tablet 04/01/2023 30 60 each Sater, Pearletha Furl, MD [facility]  dextroamphetamine-amphetamine 20 mg tablet 02/24/2023 30 60 each Sater, Pearletha Furl, MD [facility]  dextroamphetamine-amphetamine 20 mg tablet 01/27/2023 30 60 each Sater, Pearletha Furl, MD [facility]  dextroamphetamine-amphetamine 20 mg tablet 01/07/2023 30 60 each Sater, Pearletha Furl, MD [facility]  dextroamphetamine-amphetamine 20 mg tablet 12/15/2022 30 60 each Sater, Pearletha Furl, MD [facility]  dextroamphetamine-amphetamine 20 mg tablet 11/16/2022 30 60 each Sater, Pearletha Furl, MD [facility]  dextroamphetamine-amphetamine 20 mg tablet 10/14/2022 30 60 each Sater, Pearletha Furl, MD [facility]  dextroamphetamine-amphetamine 20 mg tablet 09/13/2022 30 60 each Sater, Pearletha Furl, MD [facility]  dextroamphetamine-amphetamine 20 mg tablet 08/11/2022 30 60 each Sater, Pearletha Furl, MD [facility]  dextroamphetamine-amphetamine 20 mg tablet 07/14/2022 30 60 each Shawnie Dapper, NP [facility]  dextroamphetamine-amphetamine 20 mg tablet 06/16/2022 30 60 each Shawnie Dapper, NP [facility]  dextroamphetamine-amphetamine 20 mg tablet 05/19/2022 30 60 each Shawnie Dapper, NP [facility]Marland KitchenMarland Kitchen

## 2023-05-04 NOTE — Therapy (Signed)
 OUTPATIENT PHYSICAL THERAPY LOWER EXTREMITY TREATMENT    Patient Name: Sheri Burns MRN: [id number] DOB:06-Oct-1955, 68 y.o., female Today's Date: 05/04/2023  END OF SESSION:  PT End of Session - 05/04/23 1230     Visit Number 14    Number of Visits 19    Date for PT Re-Evaluation 06/15/23    Authorization Type BCBS MCR    PT Start Time 1230    PT Stop Time 1249    PT Time Calculation (min) 19 min    Activity Tolerance Patient tolerated treatment well    Behavior During Therapy WFL for tasks assessed/performed                        Past Medical History:  Diagnosis Date   Anemia    Bulging lumbar disc 1980   Chronic pansinusitis    Colon polyp    GERD (gastroesophageal reflux disease)    Gynecological examination    sees Dr. Aram Beecham Romine    Hypertension    Multiple sclerosis (HCC) since 1992   Dr. Despina Arias in [facility]   Multiple thyroid nodules 1995   per pt. resolved on its own follwed by endo then released   Neurogenic bladder disorder 12/2009   w/ [facility] dx. Dr. McDiarmid   Neuropathy    Osteoarthritis    Osteoporosis    last DEXA 2018   Overactive bladder    sees Dr. Lorin Picket McDiarmid    Pancreatitis 1977   Ulcerative proctitis [facility])    Vision abnormalities    Past Surgical History:  Procedure Laterality Date   APPENDECTOMY     breast ruptured  left   ductal cyst   CARPAL TUNNEL RELEASE Right    CHOLECYSTECTOMY     COLONOSCOPY  01/10/2009   repeat in 5 yrs Dr. Charlott Rakes   CYSTOSCOPY WITH BIOPSY N/A 01/06/2022   Procedure: CYSTOSCOPY WITH BLADDER/URETHRAL BIOPSY;  Surgeon: Sebastian Ache, MD;  Location: [facility];  Service: Urology;  Laterality: N/A;  45 MINS   DILATION AND CURETTAGE OF UTERUS     LAPAROSCOPY     x 3   left knee surgery  1978   to remove bone tumor     NASAL SINUS SURGERY Bilateral 01/13/2015   Procedure: ENDOSCOPIC SINUS SURGERY;  Surgeon: Serena Colonel, MD;  Location: [facility];  Service: ENT;  Laterality: Bilateral;   NASAL SINUS SURGERY  05/2017   NASAL TURBINATE REDUCTION     pubo vaginal sling  10/2003   SPINAL FUSION     T7-S1 2023   TONSILLECTOMY     TOTAL KNEE ARTHROPLASTY Left 03/14/2023   Procedure: TOTAL KNEE ARTHROPLASTY;  Surgeon: Ollen Gross, MD;  Location: [facility];  Service: Orthopedics;  Laterality: Left;   vaginal cystocele repair     anterior   VAGINAL HYSTERECTOMY  1995   AUB & Endometriosis   widom teeth removal     Patient Active Problem List   Diagnosis Date Noted   Osteoarthritis of left knee 03/14/2023   Vertigo 08/05/2021   Chronic prescription opiate use 12/14/2018   Carpal tunnel syndrome, bilateral 09/13/2017   Sacroiliac joint pain 09/13/2017   Urinary urgency 06/22/2017   Sciatica, right side 06/17/2016   Dysesthesia 10/30/2015   Right carpal tunnel syndrome 10/30/2015   Sciatica of left side 07/16/2015   Chronic ethmoidal sinusitis 06/26/2015   High risk medication use 07/08/2014   Chronic fatigue 04/02/2014  Dysphagia 04/02/2014   Ataxic gait 04/02/2014   Vitamin D deficiency 06/08/2012   OVERACTIVE BLADDER 08/01/2009   Osteoporosis 08/01/2009   Depression with anxiety 07/04/2007   MULTIPLE SCLEROSIS 07/04/2007   OA (osteoarthritis) of knee 07/04/2007   Headache 07/04/2007   History of colonic polyps 07/04/2007    PCP: Thana Ates, MD   REFERRING PROVIDER:  Ollen Gross, MD      REFERRING DIAG: Z51.89 (ICD-10-CM) - Encounter for other specified aftercare     THERAPY DIAG:  Pain in joint of left knee  Posture abnormality  Muscle weakness (generalized)  Rationale for Evaluation and Treatment: Rehabilitation  ONSET DATE: 03/13/2022 DOS  L TKA  SUBJECTIVE:   SUBJECTIVE STATEMENT:  Lately I jsut get up and move around and I forget.   PERTINENT HISTORY: 2/23 fusion of T7-S1 for scoliosis; L knee surgery medial condyle excision for osteochondritis desiccans; L meniscus tear; MS  relapsing remitting  PAIN:  Are you having pain? Yes: NPRS scale: 4/10 Pain location: L ant and post knee Pain description: mostly burning Aggravating factors: movement, WB, flexion and ext Relieving factors: resting, sitting   PRECAUTIONS: L knee  WEIGHT BEARING RESTRICTIONS: WBAT  FALLS:  Has patient fallen in last 6 months? No  LIVING ENVIRONMENT: Lives with: lives alone Lives in: House/apartment Stairs: No Has following equipment at home: [facility] and walker, does not use either   OCCUPATION: ICU nurse, retired   PLOF: Independent with basic ADLs  PATIENT GOALS: return to normal walking   OBJECTIVE:    PATIENT SURVEYS:   FOTO 18 47 @ DC 19 visits 19 MCII  FOTO Knee: 54% on 2/19 (MET GOAL)   COGNITION: Overall cognitive status: Within functional limits for tasks assessed     SENSATION: Light touch impaired at anterior knee and lower leg on L  EDEMA:  N/A   PALPATION: TTP of anterior and posterior knee  Incision site well appearing with Aquacell in place; no erythema or excessive saturation of the bandage  LOWER EXTREMITY ROM:  Active ROM Right eval Left eval Lt 1/18 Left L  2/12 Lt 2/26  Knee flexion 118 89 100 105 115 120  Knee extension -3 -29 -12 -10 with pain  -3 2   (Blank rows = not tested)  LOWER EXTREMITY MMT: 4/5 of L knee 2/26: R 27.8 lb, Lt 29.2 lb  FUNCTIONAL TESTS:  Transfers: STS- independent  Moves limb without assist  GAIT: Distance walked: 77ft Assistive device utilized: None Level of assistance: indepent Comments: L flexed knee, antalgic,lack of heel strike and toe off   TODAY'S TREATMENT:                                                                                                                              DATE:  Treatment  2/21: Blank lines following charge title = not provided on this treatment date.   Manual:  TPDN No STM to distal adductors, HS, and quads  There-ex: Bike  PROM LAQ 2# 5" 3x10 Seated HSC GTB 2x10 Standing hip abduction 2# 2x10ea HR/TR 2x10 There-Act: Squats 2x10 Step ups 4" 2x10 Standing march 2x10  Treatment                            2/19: Blank lines following charge title = not provided on this treatment date.   Manual:  TPDN No  There-ex: PROM LAQ 2# 5" 3x10 Seated HSC GTB 2x10 Standing hip abduction 2# 2x10ea HR/TR 2x10 There-Act: Squats 2x10 Step ups 4" 2x10 Standing march x10    PATIENT EDUCATION:  Education details: surgical precautions, diagnosis, prognosis, anatomy, exercise progression, DOMS expectations, muscle firing,  envelope of function, HEP, POC Person educated: Patient Education method: Explanation, Demonstration, Tactile cues, Verbal cues, and Handouts Education comprehension: verbalized understanding, returned demonstration, verbal cues required, and tactile cues required  HOME EXERCISE PROGRAM:  Access Code: Knee:  [email or web address] Back:   [email or web address]   URL: [email or web address]  ASSESSMENT:  CLINICAL IMPRESSION:  Pt has met all of her goals at this time and is prepared for d/c to gym program. Encouraged her to reach out with any questions. She plans to meet with one of the trainers to help with transition back to [facility].    Eval: Patient is a 68 y.o. female who was seen today for physical therapy evaluation and treatment for c/c of L TKA. Pt's pain is highly sensitive and irritable with movement. Pt has very good knee flexion for 3 days post op but is more limited with extension as expected. Pt has been positioning to avoid knee flexion contracture but advised to actively perform heel propped quad sets and passive knee ext sitting more frequently during the day. Pt has been active and been performing hourly movement and taking blood thinners for clot prevention. No signs of infection. Plan to continue with L knee ROM and quad activation at future sessions. Consider repeat of heel  propped quad sets, standing TKE, and low grade joint mobs at next. Pt would benefit from continued skilled therapy in order to reach goals and maximize functional L LE strength and ROM for return to normal ADL and community ambulation.   OBJECTIVE IMPAIRMENTS decreased mobility, difficulty walking, decreased ROM, decreased strength, increased muscle spasms, improper body mechanics, postural dysfunction, and pain.    ACTIVITY LIMITATIONS lifting, sitting, standing, squatting, stairs, transfers, locomotion level   PARTICIPATION LIMITATIONS: cleaning, laundry, driving, shopping, community activity, occupation, yard work, and exercise   PERSONAL FACTORS Age, Fitness, Time since onset of injury/illness/exacerbation, and 2+ comorbidity:    are also affecting patient's functional outcome.    REHAB POTENTIAL: Good   CLINICAL DECISION MAKING: unstable/complicated   EVALUATION COMPLEXITY: Moderate     GOALS:     SHORT TERM GOALS: Target date: 04/28/2023    Pt will become independent with HEP in order to demonstrate synthesis of PT education..   Goal status: achieved   2.  Pt will score at least 19 pt increase on FOTO to demonstrate functional improvement in MCII and pt perceived function.      Goal status: MET 2/19   3.  Pt will be able to demonstrate 0 deg of knee ext and 120 of knee flexion in order to demonstrate functional improvement in LE function for self-care  and house hold duties.     Goal status: partially met  .  Pt will be able to demonstrate ability to walk independently for household distances in order to demonstrate functional improvement in LE function for self-care and house hold duties.     Goal status: achieved     LONG TERM GOALS: Target date: 06/09/2023     Pt  will become independent with final HEP in order to demonstrate synthesis of PT education.   Goal status: achieved   2.  Pt will score >/= 47 on FOTO to demonstrate improvement in perceived L knee  function.    Goal status: MET 2/19   3.  Pt will be able to demonstrate reciprocal stair management with single UE assist in order to demonstrate functional improvement in LE function for self-care and house hold mobility.   Goal status: achieved   4.  Pt will be able to demonstrate/report ability to walk >20 mins without pain in order to demonstrate functional improvement and tolerance to exercise and community mobility.    Goal status: achieved       PLAN: PT FREQUENCY: 1-2x/week   PT DURATION: 12 weeks (likely DC in 8)   PLANNED INTERVENTIONS: Therapeutic exercises, Therapeutic activity, Neuromuscular re-education, Balance training, Gait training, Patient/Family education, Self Care, and Joint mobilization    Ameena Vesey C. Nashira Mcglynn PT, DPT 05/04/23 12:57 PM

## 2023-05-06 ENCOUNTER — Ambulatory Visit (HOSPITAL_BASED_OUTPATIENT_CLINIC_OR_DEPARTMENT_OTHER): Payer: Medicare Other

## 2023-05-06 DIAGNOSIS — M4004 Postural kyphosis, thoracic region: Secondary | ICD-10-CM | POA: Diagnosis not present

## 2023-05-11 ENCOUNTER — Encounter (HOSPITAL_BASED_OUTPATIENT_CLINIC_OR_DEPARTMENT_OTHER): Payer: Medicare Other | Admitting: Physical Therapy

## 2023-05-11 ENCOUNTER — Encounter (HOSPITAL_BASED_OUTPATIENT_CLINIC_OR_DEPARTMENT_OTHER): Payer: Self-pay | Admitting: Physical Therapy

## 2023-05-13 ENCOUNTER — Encounter (HOSPITAL_BASED_OUTPATIENT_CLINIC_OR_DEPARTMENT_OTHER): Payer: Medicare Other

## 2023-05-16 ENCOUNTER — Other Ambulatory Visit: Payer: Self-pay | Admitting: Neurology

## 2023-05-16 DIAGNOSIS — G35 Multiple sclerosis: Secondary | ICD-10-CM

## 2023-05-17 ENCOUNTER — Encounter (HOSPITAL_BASED_OUTPATIENT_CLINIC_OR_DEPARTMENT_OTHER): Payer: Medicare Other | Admitting: Physical Therapy

## 2023-05-18 ENCOUNTER — Encounter (HOSPITAL_BASED_OUTPATIENT_CLINIC_OR_DEPARTMENT_OTHER): Payer: Medicare Other

## 2023-05-19 ENCOUNTER — Other Ambulatory Visit: Payer: Self-pay | Admitting: Neurology

## 2023-05-19 NOTE — Telephone Encounter (Signed)
 LAST SEEN 02/23/23, NEXT APPT 09/14/23, denied refill to soon Dispenses   Dispensed Days Supply Quantity Provider Pharmacy  tramadol 50 mg tablet 04/29/2023 30 120 each Sater, Pearletha Furl, MD [facility]  tramadol 50 mg tablet 04/11/2023 22 88 each Sater, Pearletha Furl, MD [facility]  tramadol 50 mg tablet 03/07/2023 30 120 each Sater, Pearletha Furl, MD [facility]  tramadol 50 mg tablet 02/04/2023 30 120 each Sater, Pearletha Furl, MD [facility]  tramadol 50 mg tablet 01/07/2023 30 120 each Sater, Pearletha Furl, MD [facility]  tramadol 50 mg tablet 12/09/2022 30 120 each Sater, Pearletha Furl, MD [facility]  tramadol 50 mg tablet 11/10/2022 30 120 each Sater, Pearletha Furl, MD [facility]  tramadol 50 mg tablet 10/12/2022 30 120 each Sater, Pearletha Furl, MD [facility]  tramadol 50 mg tablet 09/11/2022 30 120 each Shawnie Dapper, NP [facility]  tramadol 50 mg tablet 08/10/2022 30 120 each Shawnie Dapper, NP [facility]  tramadol 50 mg tablet 07/08/2022 30 120 each Shawnie Dapper, NP [facility]  tramadol 50 mg tablet 06/08/2022 30 120 each Shawnie Dapper, NP [facility]Marland KitchenMarland Kitchen

## 2023-05-20 ENCOUNTER — Encounter (HOSPITAL_BASED_OUTPATIENT_CLINIC_OR_DEPARTMENT_OTHER): Payer: Medicare Other

## 2023-05-24 ENCOUNTER — Encounter (HOSPITAL_BASED_OUTPATIENT_CLINIC_OR_DEPARTMENT_OTHER): Payer: Medicare Other | Admitting: Physical Therapy

## 2023-05-27 ENCOUNTER — Encounter (HOSPITAL_BASED_OUTPATIENT_CLINIC_OR_DEPARTMENT_OTHER): Payer: Medicare Other

## 2023-05-30 ENCOUNTER — Other Ambulatory Visit: Payer: Self-pay | Admitting: Neurology

## 2023-06-01 ENCOUNTER — Encounter (HOSPITAL_BASED_OUTPATIENT_CLINIC_OR_DEPARTMENT_OTHER): Payer: Medicare Other | Admitting: Physical Therapy

## 2023-06-01 MED ORDER — AMPHETAMINE-DEXTROAMPHETAMINE 20 MG PO TABS: 20.0000 mg | ORAL_TABLET | Freq: Two times a day (BID) | ORAL | 0 refills | Status: DC

## 2023-06-03 ENCOUNTER — Encounter (HOSPITAL_BASED_OUTPATIENT_CLINIC_OR_DEPARTMENT_OTHER): Payer: Medicare Other

## 2023-06-03 DIAGNOSIS — H2513 Age-related nuclear cataract, bilateral: Secondary | ICD-10-CM | POA: Diagnosis not present

## 2023-06-20 ENCOUNTER — Other Ambulatory Visit: Payer: Self-pay | Admitting: Neurology

## 2023-06-21 NOTE — Telephone Encounter (Signed)
 Last seen 02/23/23 and next f/u 09/14/23. Last refilled 05/27/23 #120.

## 2023-06-28 ENCOUNTER — Other Ambulatory Visit: Payer: Self-pay | Admitting: Neurology

## 2023-06-28 NOTE — Telephone Encounter (Signed)
 Last seen on 02/23/23 Follow up scheduled on 09/14/23    Dispensed Days Supply Quantity Provider Pharmacy  dextroamphetamine -amphetamine  20 mg tablet 06/27/2023 30 60 each Sater, Sherida Dimmer, MD [facility]    Rx pending to be signed

## 2023-07-18 ENCOUNTER — Other Ambulatory Visit: Payer: Self-pay | Admitting: *Deleted

## 2023-07-18 MED ORDER — DULOXETINE HCL 60 MG PO CPEP: ORAL_CAPSULE | ORAL | 0 refills | Status: DC

## 2023-07-28 ENCOUNTER — Other Ambulatory Visit: Payer: Self-pay | Admitting: Neurology

## 2023-07-28 DIAGNOSIS — G35 Multiple sclerosis: Secondary | ICD-10-CM

## 2023-07-28 DIAGNOSIS — M4004 Postural kyphosis, thoracic region: Secondary | ICD-10-CM | POA: Diagnosis not present

## 2023-07-28 NOTE — Telephone Encounter (Signed)
 Last seen on 02/23/23 Follow up scheduled on 09/14/23

## 2023-07-28 NOTE — Telephone Encounter (Signed)
 Dr.Ahtar you are work in provider this pm. Last seen on 02/23/23 Follow up scheduled on 09/14/23   dextroamphetamine -amphetamine  20 mg tablet 07/25/2023 30 60 each Sater, Sherida Dimmer, MD [facility]   I called pharmacy to confirm pt already picked up Rx  Please deny Rx.

## 2023-08-03 ENCOUNTER — Other Ambulatory Visit: Payer: Self-pay | Admitting: *Deleted

## 2023-08-03 MED ORDER — GABAPENTIN 600 MG PO TABS: 600.0000 mg | ORAL_TABLET | Freq: Four times a day (QID) | ORAL | 0 refills | Status: DC

## 2023-08-03 NOTE — Telephone Encounter (Signed)
 Last seen on 02/23/23 per note " She will continue gabapentin , tramadol , clonazepam , Adderall and oxybutynin  as prescribed. "  Follow up scheduled on 09/14/23

## 2023-08-15 ENCOUNTER — Other Ambulatory Visit: Payer: Self-pay | Admitting: Neurology

## 2023-08-16 NOTE — Telephone Encounter (Signed)
 Last seen on 02/23/23 Follow up scheduled on 09/14/23    Dispensed Days Supply Quantity Provider Pharmacy  clonazepam  1 mg tablet 07/26/2023 30 90 each Sater, Sherida Dimmer, MD [facility]     Rx pending to be signed

## 2023-08-22 DIAGNOSIS — J324 Chronic pansinusitis: Secondary | ICD-10-CM | POA: Diagnosis not present

## 2023-08-23 ENCOUNTER — Other Ambulatory Visit: Payer: Self-pay | Admitting: Neurology

## 2023-08-25 DIAGNOSIS — K080 Exfoliation of teeth due to systemic causes: Secondary | ICD-10-CM | POA: Diagnosis not present

## 2023-08-25 NOTE — Telephone Encounter (Signed)
 Last visit: 02/23/23 Next visit: 09/14/23 Last fills:   Rx refill request sent to Dr Godwin Lat (Amy NP out).

## 2023-09-14 ENCOUNTER — Ambulatory Visit: Payer: Medicare Other | Admitting: Neurology

## 2023-09-14 ENCOUNTER — Encounter: Payer: Self-pay | Admitting: Neurology

## 2023-09-14 VITALS — BP 130/78 | HR 88 | Ht 65.0 in | Wt 133.0 lb

## 2023-09-14 DIAGNOSIS — M419 Scoliosis, unspecified: Secondary | ICD-10-CM | POA: Diagnosis not present

## 2023-09-14 DIAGNOSIS — G35 Multiple sclerosis: Secondary | ICD-10-CM

## 2023-09-14 DIAGNOSIS — R3915 Urgency of urination: Secondary | ICD-10-CM | POA: Diagnosis not present

## 2023-09-14 DIAGNOSIS — Z79891 Long term (current) use of opiate analgesic: Secondary | ICD-10-CM

## 2023-09-14 DIAGNOSIS — Z79899 Other long term (current) drug therapy: Secondary | ICD-10-CM | POA: Diagnosis not present

## 2023-09-14 DIAGNOSIS — R260 Ataxic gait: Secondary | ICD-10-CM

## 2023-09-14 DIAGNOSIS — R208 Other disturbances of skin sensation: Secondary | ICD-10-CM

## 2023-09-14 NOTE — Progress Notes (Signed)
 [facility]  PATIENT: Sheri Burns DOB: 1955/12/16  REFERRING CLINICIAN: Redell Standing  HISTORY FROM: Patient REASON FOR VISIT: MS   HISTORICAL  CHIEF COMPLAINT:  Chief Complaint  Patient presents with   Follow-up    Pt in room 11. Alone. Here for MS follow up. Pt reports doing okay, has vertigo that comes and goes. Last eye exam was this year. No recent falls.    HISTORY OF PRESENT ILLNESS:  Sheri Burns is a 68 year old woman with MS.     Update  09/14/2023: She is on leflunomide  and tolerates it well.   No exacerbations. She feels her MS is stable.         Gait is doing ok and she fels it is better ince knee surgery.  She does exercise 3 days a week.  .   No issues with strength.  She has mild foot numbness which is stable.  Vision is fine.    Bladder has some urinary urgency helped by oxybutynin -XL 10 mg.    She no longer has incontinence.      She has some LBP.     She had T7 to S1 fusion (Dr. Luke in [place]) to correct scoliosis/scoliosis and for protrusions.    She needed a second operaio due to CSF leak and infection.  Pain is better   She has nerve pain in her left leg knee to foot.     She takes tramadol  but trying a lower dose was associated with more pain.     She feels fatigue is better.   She is sleeping poorly, she feels mstly due mostly to leg pain.     Mood is doing well in general.   Adderall has helped her MS related ADD and fatigue.   .  She has anxiety and takes clonazepam  1 mg 3 times daily.  Vertigo is doing better   She has had 3 one hour long episodes of vertigo and took an extra clonazepam  . Onset not associated with significant movement.    Once ws sitting on couch.    She felt she had nystagmus.   MS History:   She was diagnosed with multiple sclerosis more than 20 years ago. Initially, she was treated with Betaseron then Copaxone but has not been on any disease modifying therapy for about 8 years. During this time,  she has had occasional  Exacerbations with more fatigue and gait issues.  She also has noted mild cognitive dysfunction worsening over the past 5 or 6 years. Her MRI of the brain performed 08/31/2012 shows foci that are predominantly periventricular. They are consistent with the diagnosis of multiple sclerosis. When this MRI was compared to an MRI dated 10/12/2010, there was one additional small periventricular focus. There were no acute findings on either MRI.   In November 2015, she had an exacerbation with severe fatigue, worsened gait and dysphagia.    She started Aubagio  09/09/14.      IMAGING MRI brain 08/22/2023 showed Scattered T2/FLAIR hypertense foci in the cerebral hemispheres and a couple small infratentorial foci in a pattern consistent with chronic demyelinating plaque associated with multiple sclerosis. None of the foci appear to be acute. Compared to the MRI from 09/19/2019, there were no new lesions.   L-spine MRI (12/22/20 shows. Multiplevel DJD.   Changes comared to 2019 were:   SABRA New large disc extrusion at L1-2 resulting in severe right lateral recess stenosis and right L2 nerve root impingement. 2. Decreased size  of L4-5 disc protrusion with slightly decreased left lateral recess stenosis.  MRI thoracic spine 12/22/2020 showed Thoracic scoliosis.  No evidence of fracture or focal bone lesion.    There was chronic disc degeneration at T2-3 with a shallow central disc protrusion that narrows the ventral subarachnoid space but does not compress the cord. Chronic endplate marrow changes without evidence of active edema.     Left foraminal narrowing at T9-10, T10-11 and T11-12 with some potential to cause left-sided neural compression, particularly at T10-11.    It does show significant degenerative changes at several levels.  At L4-L5 there appears to be a disc herniation towards the left that could be compressing the left L5 nerve root.  At L3-L4, there are changes more to the right  that could affect the right L4 nerve root.  There are also degenerative changes at L1-L2 and L2-L3 though there does not appear to be nerve root compression at those levels.  MRI brain 7/142021 shows multiple T2/FLAIR hyperintense foci in the hemispheres with a few punctate foci in the pons and cerebral hemispheres. The pattern is consistent with chronic demyelinating plaque associated with multiple sclerosis. None of the foci appears to be acute. Compared to the MRI dated 08/31/2016, there are no new lesions.  MRI 08/31/2016 showed multiple lesions in the hemispheres and pons but no new lesions compared to 10/31/2014.  MRI 10/31/2014 showed periventricular and deep white matter T2/flair hyperintense foci in a pattern and configuration consistent with multiple sclerosis. None of these foci enhanced after contrast administration.  There were no new lesions compared to 08/30/2012 MRI    OTHER  NCV/EMG 10/18/2017 1.    Moderately severe right median neuropathy at the wrist (carpal tunnel syndrome).   Chronic denervation changes were noted in the APB muscle. 2.    Borderline left median neuropathy at the wrist. 3.    There is no evidence of a significant cervical radiculopathy on the right. REVIEW OF SYSTEMS:  Constitutional: No fevers, chills, sweats, or change in appetite.  She notes a lot of fatigue. She is sleeping well. Eyes: No visual changes, double vision, eye pain Ear, nose and throat: No hearing loss, ear pain, nasal congestion, sore throat Cardiovascular: No chest pain, palpitations Respiratory:  No shortness of breath at rest or with exertion.   No wheezes GastrointestinaI: No nausea, vomiting, diarrhea.  Has constipation Genitourinary:  see above. Musculoskeletal:  reports pain in knees.   Reports neck pain and back pain Integumentary: No rash, pruritus, skin lesions Neurological: as above Psychiatric: Depression and  Anxiety noted Endocrine: No palpitations, diaphoresis, change in  appetite, change in weigh or increased thirst Hematologic/Lymphatic:  No anemia, purpura, petechiae. Allergic/Immunologic: No itchy/runny eyes, nasal congestion, recent allergic reactions, rashes  ALLERGIES: Allergies  Allergen Reactions   Amoxicillin Hives and Itching   Codeine Nausea And Vomiting    vomiting   Demerol [Meperidine] Nausea And Vomiting   Egg-Derived Products Nausea And Vomiting   Ivp Dye [Iodinated Contrast Media] Hives   Penicillins Hives    Tolerated Cephalosporin Date: 03/15/23.     Risedronate  Other (See Comments)    Gi upset   Stadol [Butorphanol] Other (See Comments)    Per patient psychotic event   Sulfonamide Derivatives Hives   Clindamycin /Lincomycin Rash   Dog Epithelium (Canis Lupus Familiaris) Other (See Comments)    Sneezing, cough Cat/ sinus infection and congestion   Gadolinium Derivatives Hives    HOME MEDICATIONS: Outpatient Medications Prior to Visit  Medication Sig  Dispense Refill   acetaminophen  (TYLENOL ) 500 MG tablet Take 500 mg by mouth 4 (four) times daily. (0800, 1200, 1600 & 2000)     amLODipine -valsartan  (EXFORGE ) 5-160 MG tablet Take 1 tablet by mouth at bedtime.     amphetamine -dextroamphetamine  (ADDERALL) 20 MG tablet TAKE 1 TABLET BY MOUTH 2 TIMES DAILY 60 tablet 0   clonazePAM  (KLONOPIN ) 1 MG tablet TAKE 1 TABLET BY MOUTH 3 TIMES DAILY AS NEEDED 90 tablet 5   COVID-19 mRNA vaccine, Pfizer, (COMIRNATY ) syringe Inject into the muscle. 0.3 mL 0   denosumab  (PROLIA ) 60 MG/ML SOSY injection Inject 60 mg into the skin every 6 (six) months. 1 each 1   DULoxetine  (CYMBALTA ) 60 MG capsule TAKE 1 CAPSULE BY MOUTH EVERY DAY 90 capsule 0   finasteride  (PROPECIA ) 1 MG tablet Take 1 mg by mouth daily at 12 noon. (1200)     fluticasone  (FLONASE  ALLERGY RELIEF) 50 MCG/ACT nasal spray Place 2 sprays into both nostrils at bedtime.     gabapentin  (NEURONTIN ) 600 MG tablet Take 1 tablet (600 mg total) by mouth 4 (four) times daily. 360 tablet 0    leflunomide  (ARAVA ) 20 MG tablet TAKE 1 TABLET BY MOUTH ONCE DAILY 90 tablet 0   oxybutynin  (DITROPAN -XL) 10 MG 24 hr tablet TAKE 1 TABLET BY MOUTH EVERY DAY (Patient taking differently: Take 10 mg by mouth daily at 4 PM. (1600)) 90 tablet 3   PROTONIX  20 MG tablet Take 20 mg by mouth at bedtime.     traMADol  (ULTRAM ) 50 MG tablet TAKE 1 TABLET BY MOUTH EVERY 6 HOURS AS NEEDED (8AM, 12NOON, AT 4PM & 8PM) 120 tablet 5   methocarbamol  (ROBAXIN ) 500 MG tablet Take 1 tablet (500 mg total) by mouth every 6 (six) hours as needed for muscle spasms. (Patient not taking: Reported on 09/14/2023) 40 tablet 0   ondansetron  (ZOFRAN ) 4 MG tablet Take 1 tablet (4 mg total) by mouth every 6 (six) hours as needed for nausea. (Patient not taking: Reported on 09/14/2023) 20 tablet 0   oxyCODONE  (OXY IR/ROXICODONE ) 5 MG immediate release tablet Take 1-2 tablets (5-10 mg total) by mouth every 6 (six) hours as needed for severe pain (pain score 7-10). (Patient not taking: Reported on 09/14/2023) 42 tablet 0   rivaroxaban  (XARELTO ) 10 MG TABS tablet Take 1 tablet (10 mg total) by mouth daily with breakfast for 20 days. (Patient not taking: Reported on 09/14/2023) 20 tablet 0   No facility-administered medications prior to visit.    PAST MEDICAL HISTORY: Past Medical History:  Diagnosis Date   Anemia    Bulging lumbar disc 1980   Chronic pansinusitis    Colon polyp    GERD (gastroesophageal reflux disease)    Gynecological examination    sees Dr. Montie Romine    Hypertension    Multiple sclerosis [facility]) since 1992   Dr. Charlie Crete in [facility]   Multiple thyroid nodules 1995   per pt. resolved on its own follwed by endo then released   Neurogenic bladder disorder 12/2009   w/ [facility] dx. Dr. McDiarmid   Neuropathy    Osteoarthritis    Osteoporosis    last DEXA 2018   Overactive bladder    sees Dr. Glendia McDiarmid    Pancreatitis 1977   Ulcerative proctitis (HCC)    Vision abnormalities      PAST SURGICAL HISTORY: Past Surgical History:  Procedure Laterality Date   APPENDECTOMY     breast ruptured  left   ductal cyst   CARPAL TUNNEL RELEASE Right    CHOLECYSTECTOMY     COLONOSCOPY  01/10/2009   repeat in 5 yrs Dr. Jerrell Sol   CYSTOSCOPY WITH BIOPSY N/A 01/06/2022   Procedure: CYSTOSCOPY WITH BLADDER/URETHRAL BIOPSY;  Surgeon: Alvaro Hummer, MD;  Location: [facility];  Service: Urology;  Laterality: N/A;  45 MINS   DILATION AND CURETTAGE OF UTERUS     LAPAROSCOPY     x 3   left knee surgery  1978   to remove bone tumor     NASAL SINUS SURGERY Bilateral 01/13/2015   Procedure: ENDOSCOPIC SINUS SURGERY;  Surgeon: Ida Loader, MD;  Location: [facility];  Service: ENT;  Laterality: Bilateral;   NASAL SINUS SURGERY  05/2017   NASAL TURBINATE REDUCTION     pubo vaginal sling  10/2003   SPINAL FUSION     T7-S1 2023   TONSILLECTOMY     TOTAL KNEE ARTHROPLASTY Left 03/14/2023   Procedure: TOTAL KNEE ARTHROPLASTY;  Surgeon: Melodi Lerner, MD;  Location: [facility];  Service: Orthopedics;  Laterality: Left;   vaginal cystocele repair     anterior   VAGINAL HYSTERECTOMY  1995   AUB & Endometriosis   widom teeth removal      FAMILY HISTORY: Family History  Problem Relation Age of Onset   Osteoporosis Mother        severe   Cancer Mother 50       colon /Breast also m. niece at 15's   Breast cancer Mother    Osteopenia Sister        X 2   Breast cancer Other     SOCIAL HISTORY:  Social History   Socioeconomic History   Marital status: Widowed    Spouse name: Tanda   Number of children: 1   Years of education: 16   Highest education level: Not on file  Occupational History   Occupation: BS Nursing    Employer: UNEMPLOYED   Occupation: [employer] consultant  Tobacco Use   Smoking status: Former    Rahl packs/day: 0.00    Average packs/day: 1 pack/day for 5.0 years (5.0 ttl pk-yrs)    Types: Cigarettes    Start date: 03/08/1974    Quit  date: 03/09/1979    Years since quitting: 44.5   Smokeless tobacco: Never  Vaping Use   Vaping status: Never Used  Substance and Sexual Activity   Alcohol use: No   Drug use: No   Sexual activity: Not Currently    Partners: Male    Birth control/protection: Surgical  Other Topics Concern   Not on file  Social History Narrative   Right Handed   2 8oz bottles of Coke   Social Drivers of Health   Financial Resource Strain: Not on file  Food Insecurity: No Food Insecurity (03/14/2023)   Hunger Vital Sign    Worried About Running Out of Food in the Last Year: Never true    Ran Out of Food in the Last Year: Never true  Transportation Needs: No Transportation Needs (03/14/2023)   PRAPARE - Administrator, Civil Service (Medical): No    Lack of Transportation (Non-Medical): No  Physical Activity: Not on file  Stress: Not on file  Social Connections: Unknown (03/14/2023)   Social Connection and Isolation Panel    Frequency of Communication with Friends and Family: More than three times a week    Frequency of Social Gatherings with Friends and  Family: Once a week    Attends Religious Services: More than 4 times per year    Active Member of Clubs or Organizations: Yes    Attends Banker Meetings: More than 4 times per year    Marital Status: Patient declined  Intimate Partner Violence: Not At Risk (03/14/2023)   Humiliation, Afraid, Rape, and Kick questionnaire    Fear of [place] or Ex-Partner: No    Emotionally Abused: No    Physically Abused: No    Sexually Abused: No     PHYSICAL EXAM  Vitals:   09/14/23 1427 09/14/23 1433  BP: (!) 158/76 130/78  Pulse: 88   Weight: 133 lb (60.3 kg)   Height: 5' 5 (1.651 m)     Body mass index is 22.13 kg/m.   General: The patient is well-developed and well-nourished and in no acute distress.     Neurologic Exam  Mental status: The patient is alert and oriented x 3 at the time of the examination. The patient has  apparent normal recent and remote memory, with an apparently normal attention span and concentration ability.   Speech is normal.  Cranial nerves: Extraocular movements are full.  Facial strength and sensation is normal.  Trapezius strength is strong..  Motor:  Muscle bulk is normal and tone is increased in legs. Strength is  5 / 5  Sensory: She has numbness and allodynia in the left  L5 distribution.  .   Coordination: Cerebellar testing showed good heel to shin but and finger to nose  Gait and station: Station is stable.  Gait is mildly wide and arthritic.   Tandem gait is wide Romberg is negative.  Reflexes: Deep tendon reflexes are symmetric and fairly normal in the arms. However, she has increased reflexes in the legs with spread at the knees .  No clonus in the ankles...    ______________________________   MULTIPLE SCLEROSIS - Plan: Comprehensive metabolic panel with GFR, CBC with Differential/Platelets  High risk medication use - Plan: Comprehensive metabolic panel with GFR, CBC with Differential/Platelets  Scoliosis of thoracolumbar spine, unspecified scoliosis type  Urinary urgency  Dysesthesia  Ataxic gait  Chronic prescription opiate use   1.   Continue leflunomide  for MS.  Will check blood work .  We discussed that if she continues to be stable we could consider discontinuing the leflunomide .   2.  She will continue her medications.  She can take an additional clonazepam  if vertigo recurs   We discussed trying to to cut clonazepam  and/or tramadol  down some if tolerated as the effects could be additive. 3.   Try to stay active and exercise as tolerated 4.    Return to see us  in 6 months or sooner for new or worsening neurologic symptoms  This visit is part of a comprehensive longitudinal care medical relationship regarding the patients primary diagnosis of MS and related concerns.   Arnika Larzelere A. Vear, MD, PhD 09/14/2023, 3:27 PM Certified in Neurology, Clinical  Neurophysiology, Sleep Medicine, Pain Medicine and Neuroimaging  [facility] [address], [address] [place], [place] [address] [phone]

## 2023-09-15 ENCOUNTER — Ambulatory Visit: Payer: Self-pay | Admitting: Neurology

## 2023-09-15 LAB — CBC WITH DIFFERENTIAL/PLATELET
Basophils Absolute: 0.1 x10E3/uL (ref 0.0–0.2)
Basos: 1 %
EOS (ABSOLUTE): 0.4 x10E3/uL (ref 0.0–0.4)
Eos: 5 %
Hematocrit: 35.9 % (ref 34.0–46.6)
Hemoglobin: 11.5 g/dL (ref 11.1–15.9)
Immature Grans (Abs): 0 x10E3/uL (ref 0.0–0.1)
Immature Granulocytes: 0 %
Lymphocytes Absolute: 2.1 x10E3/uL (ref 0.7–3.1)
Lymphs: 27 %
MCH: 31.3 pg (ref 26.6–33.0)
MCHC: 32 g/dL (ref 31.5–35.7)
MCV: 98 fL — ABNORMAL HIGH (ref 79–97)
Monocytes Absolute: 1 x10E3/uL — ABNORMAL HIGH (ref 0.1–0.9)
Monocytes: 12 %
Neutrophils Absolute: 4.3 x10E3/uL (ref 1.4–7.0)
Neutrophils: 55 %
Platelets: 392 x10E3/uL (ref 150–450)
RBC: 3.67 x10E6/uL — ABNORMAL LOW (ref 3.77–5.28)
RDW: 11.5 % — ABNORMAL LOW (ref 11.7–15.4)
WBC: 8 x10E3/uL (ref 3.4–10.8)

## 2023-09-15 LAB — COMPREHENSIVE METABOLIC PANEL WITH GFR
ALT: 16 IU/L (ref 0–32)
AST: 27 IU/L (ref 0–40)
Albumin: 3.9 g/dL (ref 3.9–4.9)
Alkaline Phosphatase: 88 IU/L (ref 44–121)
BUN/Creatinine Ratio: 17 (ref 12–28)
BUN: 18 mg/dL (ref 8–27)
Bilirubin Total: <0.2 mg/dL (ref 0.0–1.2)
CO2: 25 mmol/L (ref 20–29)
Calcium: 9.7 mg/dL (ref 8.7–10.3)
Chloride: 103 mmol/L (ref 96–106)
Creatinine, Ser: 1.04 mg/dL — ABNORMAL HIGH (ref 0.57–1.00)
Globulin, Total: 3 g/dL (ref 1.5–4.5)
Glucose: 84 mg/dL (ref 70–99)
Potassium: 4.6 mmol/L (ref 3.5–5.2)
Sodium: 142 mmol/L (ref 134–144)
Total Protein: 6.9 g/dL (ref 6.0–8.5)
eGFR: 58 mL/min/1.73 — ABNORMAL LOW (ref >59)

## 2023-09-17 ENCOUNTER — Encounter (HOSPITAL_BASED_OUTPATIENT_CLINIC_OR_DEPARTMENT_OTHER): Payer: Self-pay

## 2023-09-17 ENCOUNTER — Emergency Department (HOSPITAL_BASED_OUTPATIENT_CLINIC_OR_DEPARTMENT_OTHER): Admission: EM | Admit: 2023-09-17 | Discharge: 2023-09-17 | Disposition: A | Discharge status: Home / Self Care

## 2023-09-17 DIAGNOSIS — M5431 Sciatica, right side: Secondary | ICD-10-CM | POA: Diagnosis not present

## 2023-09-17 DIAGNOSIS — M545 Low back pain, unspecified: Secondary | ICD-10-CM | POA: Diagnosis not present

## 2023-09-17 DIAGNOSIS — M5441 Lumbago with sciatica, right side: Secondary | ICD-10-CM | POA: Diagnosis not present

## 2023-09-17 MED ORDER — HYDROMORPHONE HCL 1 MG/ML IJ SOLN
0.5000 mg | Freq: Once | INTRAMUSCULAR | Status: AC
  Administered 2023-09-17: 0.5 mg via INTRAVENOUS
  Filled 2023-09-17: qty 1

## 2023-09-17 MED ORDER — KETOROLAC TROMETHAMINE 15 MG/ML IJ SOLN
15.0000 mg | Freq: Once | INTRAMUSCULAR | Status: AC
  Administered 2023-09-17: 15 mg via INTRAVENOUS
  Filled 2023-09-17: qty 1

## 2023-09-17 MED ORDER — METHYLPREDNISOLONE 4 MG PO TBPK: ORAL_TABLET | ORAL | 0 refills | Status: AC

## 2023-09-17 MED ORDER — METHYLPREDNISOLONE SODIUM SUCC 125 MG IJ SOLR
125.0000 mg | Freq: Once | INTRAMUSCULAR | Status: AC
  Administered 2023-09-17: 125 mg via INTRAVENOUS
  Filled 2023-09-17: qty 2

## 2023-09-17 MED ORDER — METHOCARBAMOL 500 MG PO TABS: 500.0000 mg | ORAL_TABLET | Freq: Three times a day (TID) | ORAL | 0 refills | Status: AC | PRN

## 2023-09-17 MED ORDER — ONDANSETRON HCL 4 MG/2ML IJ SOLN
4.0000 mg | Freq: Once | INTRAMUSCULAR | Status: AC
  Administered 2023-09-17: 4 mg via INTRAVENOUS
  Filled 2023-09-17: qty 2

## 2023-09-17 NOTE — ED Triage Notes (Signed)
 She c/o chronic left-sided sciatica-type pain and also states she has extensive spinal surgery history. She is here today with increase in left-sided low back pain radiating into left hip/leg and also with new (since yesterday) pain in her right low back/hi/leg.

## 2023-09-17 NOTE — Discharge Instructions (Addendum)
 Take your other medications as prescribed and your pain meds as needed at home.  Take your steroids as prescribed and Robaxin  as needed for pain and spasm.  You can send heat as needed.  Continue to do gentle range of motion exercises and the need to move around as this will usually get worse with being still for prolonged periods of time.  Follow-up with your primary care doctor.  Return to the ER for new or worsening symptoms.

## 2023-09-17 NOTE — ED Provider Notes (Signed)
 [place] EMERGENCY DEPARTMENT AT [facility] Provider Note   CSN: [id number] Arrival date & time: 09/17/23  9045     Patient presents with: Back Pain   Sheri Burns is a 68 y.o. female.   68 year old female presents for evaluation of back pain.  She has a history of chronic back pain from previous surgery as usual on the left side.  States her left-sided back pain and sciatica was worse yesterday and some today she woke up with severe right-sided back pain.  States she has had some trouble ambulating due to the pain.  She has a history of MS but this feels different than an MS flare.  She states the pain is burning and sharp and starts at her buttocks, and radiates down her calf into her foot.  Denies any other symptoms or concerns at this time.   Back Pain Associated symptoms: no abdominal pain, no chest pain, no dysuria and no fever        Prior to Admission medications   Medication Sig Start Date End Date Taking? Authorizing Provider  methocarbamol  (ROBAXIN ) 500 MG tablet Take 1 tablet (500 mg total) by mouth every 8 (eight) hours as needed for up to 10 days for muscle spasms (pain, spasm). 09/17/23 09/27/23 Yes Brittnae Aschenbrenner L, DO  methylPREDNISolone  (MEDROL  DOSEPAK) 4 MG TBPK tablet Take 8 tablets (32 mg total) by mouth daily for 1 day, THEN 4 tablets (16 mg total) daily for 1 day, THEN 2 tablets (8 mg total) daily for 1 day, THEN 1 tablet (4 mg total) daily for 1 day. 09/17/23 09/21/23 Yes Trellis Guirguis L, DO  acetaminophen  (TYLENOL ) 500 MG tablet Take 500 mg by mouth 4 (four) times daily. (0800, 1200, 1600 & 2000)    [provider]  amLODipine -valsartan  (EXFORGE ) 5-160 MG tablet Take 1 tablet by mouth at bedtime. 12/07/19   [provider]  amphetamine -dextroamphetamine  (ADDERALL) 20 MG tablet TAKE 1 TABLET BY MOUTH 2 TIMES DAILY 08/25/23   Sater, Charlie LABOR, MD  clonazePAM  (KLONOPIN ) 1 MG tablet TAKE 1 TABLET BY MOUTH 3 TIMES DAILY AS NEEDED  08/16/23   Sater, Charlie LABOR, MD  COVID-19 mRNA vaccine, Pfizer, (COMIRNATY ) syringe Inject into the muscle. 02/09/23   Luiz Channel, MD  denosumab  (PROLIA ) 60 MG/ML SOSY injection Inject 60 mg into the skin every 6 (six) months. 07/14/17   Cathlyn JAYSON Nikki Bobie FORBES, MD  DULoxetine  (CYMBALTA ) 60 MG capsule TAKE 1 CAPSULE BY MOUTH EVERY DAY 07/18/23   Lomax, Amy, NP  finasteride  (PROPECIA ) 1 MG tablet Take 1 mg by mouth daily at 12 noon. (1200)    [provider]  fluticasone  (FLONASE  ALLERGY RELIEF) 50 MCG/ACT nasal spray Place 2 sprays into both nostrils at bedtime.    [provider]  gabapentin  (NEURONTIN ) 600 MG tablet Take 1 tablet (600 mg total) by mouth 4 (four) times daily. 08/03/23   Lomax, Amy, NP  leflunomide  (ARAVA ) 20 MG tablet TAKE 1 TABLET BY MOUTH ONCE DAILY 07/28/23   Sater, Charlie LABOR, MD  methocarbamol  (ROBAXIN ) 500 MG tablet Take 1 tablet (500 mg total) by mouth every 6 (six) hours as needed for muscle spasms. Patient not taking: Reported on 09/14/2023 03/15/23   Edmisten, Roxie L, PA  ondansetron  (ZOFRAN ) 4 MG tablet Take 1 tablet (4 mg total) by mouth every 6 (six) hours as needed for nausea. Patient not taking: Reported on 09/14/2023 03/15/23   Edmisten, Roxie L, PA  oxybutynin  (DITROPAN -XL) 10 MG 24 hr  tablet TAKE 1 TABLET BY MOUTH EVERY DAY Patient taking differently: Take 10 mg by mouth daily at 4 PM. (1600) 09/02/22   Sater, Charlie LABOR, MD  oxyCODONE  (OXY IR/ROXICODONE ) 5 MG immediate release tablet Take 1-2 tablets (5-10 mg total) by mouth every 6 (six) hours as needed for severe pain (pain score 7-10). Patient not taking: Reported on 09/14/2023 03/15/23   Edmisten, Kristie L, PA  PROTONIX  20 MG tablet Take 20 mg by mouth at bedtime. 10/01/22   [provider]  rivaroxaban  (XARELTO ) 10 MG TABS tablet Take 1 tablet (10 mg total) by mouth daily with breakfast for 20 days. Patient not taking: Reported on 09/14/2023 03/16/23 04/05/23  Edmisten, Roxie L, PA  traMADol   (ULTRAM ) 50 MG tablet TAKE 1 TABLET BY MOUTH EVERY 6 HOURS AS NEEDED (8AM, 12NOON, AT 4PM & 8PM) 06/21/23   Sater, Charlie LABOR, MD    Allergies: Amoxicillin, Codeine, Demerol [meperidine], Egg-derived products, Ivp dye [iodinated contrast media], Penicillins, Risedronate , Stadol [butorphanol], Sulfonamide derivatives, Clindamycin /lincomycin, Dog epithelium (canis lupus familiaris), and Gadolinium derivatives    Review of Systems  Constitutional:  Negative for chills and fever.  HENT:  Negative for ear pain and sore throat.   Eyes:  Negative for pain and visual disturbance.  Respiratory:  Negative for cough and shortness of breath.   Cardiovascular:  Negative for chest pain and palpitations.  Gastrointestinal:  Negative for abdominal pain and vomiting.  Genitourinary:  Negative for dysuria and hematuria.  Musculoskeletal:  Positive for back pain. Negative for arthralgias.  Skin:  Negative for color change and rash.  Neurological:  Negative for seizures and syncope.  All other systems reviewed and are negative.   Updated Vital Signs BP (!) 168/85   Pulse 82   Temp 98.1 F (36.7 C) (Oral)   Resp 15   LMP 07/06/1993   SpO2 92%   Physical Exam Vitals and nursing note reviewed.  Constitutional:      General: She is not in acute distress.    Appearance: Normal appearance. She is well-developed. She is not ill-appearing.  HENT:     Head: Normocephalic and atraumatic.  Eyes:     Conjunctiva/sclera: Conjunctivae normal.  Cardiovascular:     Rate and Rhythm: Normal rate and regular rhythm.     Heart sounds: No murmur heard. Pulmonary:     Effort: Pulmonary effort is normal. No respiratory distress.     Breath sounds: Normal breath sounds.  Abdominal:     Palpations: Abdomen is soft.     Tenderness: There is no abdominal tenderness.  Musculoskeletal:        General: No swelling.     Cervical back: Neck supple.     Comments: There is tenderness to palpation over the right sciatic  area and piriformis muscle, there is some right leg weakness when compared to left secondary to pain but full range of motion of the right lower extremity, patient appears uncomfortable when trying to move in the bed  Skin:    General: Skin is warm and dry.     Capillary Refill: Capillary refill takes less than 2 seconds.  Neurological:     Mental Status: She is alert.  Psychiatric:        Mood and Affect: Mood normal.     (all labs ordered are listed, but only abnormal results are displayed) Labs Reviewed - No data to display  EKG: None  Radiology: No results found.   Procedures   Medications Ordered in the  ED  HYDROmorphone  (DILAUDID ) injection 0.5 mg (0.5 mg Intravenous Given 09/17/23 1119)  ondansetron  (ZOFRAN ) injection 4 mg (4 mg Intravenous Given 09/17/23 1120)  ketorolac  (TORADOL ) 15 MG/ML injection 15 mg (15 mg Intravenous Given 09/17/23 1120)  methylPREDNISolone  sodium succinate (SOLU-MEDROL ) 125 mg/2 mL injection 125 mg (125 mg Intravenous Given 09/17/23 1120)                                    Medical Decision Making Patient here for right side sciatica.  She has a lot of chronic back at work and I think it is from some overuse of her right side.  She is much better after Dilaudid  steroids and Robaxin .  Will give her prescription for Robaxin  and steroids to use and she will continue her other medication at home as needed.  Advise close follow-up with her specialist and otherwise return to the ER for any worsening symptoms.  She feels comfortable plan be discharged home.  Problems Addressed: Sciatica of right side: acute illness or injury  Amount and/or Complexity of Data Reviewed External Data Reviewed: notes.    Details: Prior records reviewed and patient does follow-up as an outpatient for her MS with neurology  Risk OTC drugs. Prescription drug management. Parenteral controlled substances. Drug therapy requiring intensive monitoring for  toxicity.     Final diagnoses:  Sciatica of right side    ED Discharge Orders          Ordered    methocarbamol  (ROBAXIN ) 500 MG tablet  Every 8 hours PRN        09/17/23 1323    methylPREDNISolone  (MEDROL  DOSEPAK) 4 MG TBPK tablet  Daily        09/17/23 1323               Matalynn Graff, Duwaine L, DO 09/17/23 1455

## 2023-09-19 ENCOUNTER — Other Ambulatory Visit: Payer: Self-pay | Admitting: Neurology

## 2023-09-19 ENCOUNTER — Telehealth: Payer: Self-pay | Admitting: Neurology

## 2023-09-19 ENCOUNTER — Encounter: Payer: Self-pay | Admitting: Neurology

## 2023-09-19 DIAGNOSIS — M5431 Sciatica, right side: Secondary | ICD-10-CM

## 2023-09-19 DIAGNOSIS — Z9889 Other specified postprocedural states: Secondary | ICD-10-CM

## 2023-09-19 NOTE — Telephone Encounter (Signed)
 [employer] medicare shara: [id number] exp. 09/19/23-10/18/23 sent to [facility] [phone]

## 2023-09-21 ENCOUNTER — Other Ambulatory Visit: Payer: Self-pay | Admitting: Neurology

## 2023-09-21 DIAGNOSIS — M5441 Lumbago with sciatica, right side: Secondary | ICD-10-CM | POA: Diagnosis not present

## 2023-09-22 NOTE — Telephone Encounter (Signed)
 Last seen 09/14/23 and next f/u 04/16/24. Last refilled 09/21/23 #60.

## 2023-09-26 DIAGNOSIS — K080 Exfoliation of teeth due to systemic causes: Secondary | ICD-10-CM | POA: Diagnosis not present

## 2023-09-28 ENCOUNTER — Ambulatory Visit
Admission: RE | Admit: 2023-09-28 | Discharge: 2023-09-28 | Disposition: A | Discharge status: Home / Self Care | Source: Ambulatory Visit | Attending: Neurology | Admitting: Neurology

## 2023-09-28 ENCOUNTER — Telehealth: Payer: Self-pay

## 2023-09-28 ENCOUNTER — Encounter: Payer: Self-pay | Admitting: Neurology

## 2023-09-28 DIAGNOSIS — Z9889 Other specified postprocedural states: Secondary | ICD-10-CM

## 2023-09-28 DIAGNOSIS — M5431 Sciatica, right side: Secondary | ICD-10-CM

## 2023-09-28 MED ORDER — GADOPICLENOL 0.5 MMOL/ML IV SOLN: 6.0000 mL | Freq: Once | INTRAVENOUS | Status: DC | PRN

## 2023-09-28 MED ORDER — PREDNISONE 50 MG PO TABS: ORAL_TABLET | ORAL | 0 refills | Status: DC

## 2023-09-28 NOTE — Telephone Encounter (Signed)
 Phone call to patient to review instructions for 13 hr prep for MR w/ contrast on 09/30/23  at 10:40AM. Prescription called into [facility]. Pt aware and verbalized understanding of instructions. Prescription:  Pt to take 50 mg of prednisone  on 09/29/23 at 9:40PM, 50 mg of prednisone  on 09/30/23 at 3:40AM, and 50 mg of prednisone  on 09/30/23 at 9:40AM. Pt is also to take 50 mg of benadryl  on 09/30/23 at 9:40AM. Please call [phone] with any questions.

## 2023-09-30 ENCOUNTER — Ambulatory Visit
Admission: RE | Admit: 2023-09-30 | Discharge: 2023-09-30 | Disposition: A | Discharge status: Home / Self Care | Source: Ambulatory Visit | Attending: Neurology | Admitting: Neurology

## 2023-09-30 DIAGNOSIS — M5431 Sciatica, right side: Secondary | ICD-10-CM | POA: Diagnosis not present

## 2023-09-30 DIAGNOSIS — M5416 Radiculopathy, lumbar region: Secondary | ICD-10-CM | POA: Diagnosis not present

## 2023-09-30 DIAGNOSIS — Z9889 Other specified postprocedural states: Secondary | ICD-10-CM | POA: Diagnosis not present

## 2023-09-30 MED ORDER — GADOPICLENOL 0.5 MMOL/ML IV SOLN
6.0000 mL | Freq: Once | INTRAVENOUS | Status: AC | PRN
  Administered 2023-09-30: 6 mL via INTRAVENOUS

## 2023-10-01 ENCOUNTER — Ambulatory Visit: Payer: Self-pay | Admitting: Neurology

## 2023-10-03 ENCOUNTER — Encounter: Payer: Self-pay | Admitting: Neurology

## 2023-10-04 DIAGNOSIS — H2511 Age-related nuclear cataract, right eye: Secondary | ICD-10-CM | POA: Diagnosis not present

## 2023-10-04 DIAGNOSIS — H25041 Posterior subcapsular polar age-related cataract, right eye: Secondary | ICD-10-CM | POA: Diagnosis not present

## 2023-10-04 DIAGNOSIS — H25811 Combined forms of age-related cataract, right eye: Secondary | ICD-10-CM | POA: Diagnosis not present

## 2023-10-04 DIAGNOSIS — H52201 Unspecified astigmatism, right eye: Secondary | ICD-10-CM | POA: Diagnosis not present

## 2023-10-13 DIAGNOSIS — M810 Age-related osteoporosis without current pathological fracture: Secondary | ICD-10-CM | POA: Diagnosis not present

## 2023-10-17 ENCOUNTER — Other Ambulatory Visit: Payer: Self-pay | Admitting: Internal Medicine

## 2023-10-17 DIAGNOSIS — Z1231 Encounter for screening mammogram for malignant neoplasm of breast: Secondary | ICD-10-CM

## 2023-10-20 DIAGNOSIS — T84216A Breakdown (mechanical) of internal fixation device of vertebrae, initial encounter: Secondary | ICD-10-CM | POA: Diagnosis not present

## 2023-10-20 DIAGNOSIS — M4004 Postural kyphosis, thoracic region: Secondary | ICD-10-CM | POA: Diagnosis not present

## 2023-10-21 ENCOUNTER — Encounter: Payer: Self-pay | Admitting: Neurology

## 2023-10-21 ENCOUNTER — Other Ambulatory Visit: Payer: Self-pay | Admitting: Neurology

## 2023-10-21 DIAGNOSIS — G35 Multiple sclerosis: Secondary | ICD-10-CM

## 2023-10-24 ENCOUNTER — Other Ambulatory Visit: Payer: Self-pay | Admitting: *Deleted

## 2023-10-24 MED ORDER — DULOXETINE HCL 60 MG PO CPEP: ORAL_CAPSULE | ORAL | 1 refills | Status: DC

## 2023-10-24 MED ORDER — GABAPENTIN 600 MG PO TABS: 600.0000 mg | ORAL_TABLET | Freq: Four times a day (QID) | ORAL | 6 refills | Status: AC

## 2023-10-24 NOTE — Telephone Encounter (Signed)
 Last seen on 09/14/23 Follow up scheduled on 04/13/24    Dispensed Days Supply Quantity Provider Pharmacy  AMPHETAMINE -DEXTROAMPHETAMINE  20 MG AMPHETAMINE -DEXTROAMPHETAMINE  20 MG  TABS 10/21/2023 30 60 tablet Sater, Charlie LABOR, MD [facility]     Rx pending to be signed

## 2023-10-24 NOTE — Telephone Encounter (Addendum)
 Last seen on 09/14/23 Follow up scheduled 04/16/24

## 2023-11-01 DIAGNOSIS — H25812 Combined forms of age-related cataract, left eye: Secondary | ICD-10-CM | POA: Diagnosis not present

## 2023-11-01 DIAGNOSIS — H2512 Age-related nuclear cataract, left eye: Secondary | ICD-10-CM | POA: Diagnosis not present

## 2023-11-01 DIAGNOSIS — H52202 Unspecified astigmatism, left eye: Secondary | ICD-10-CM | POA: Diagnosis not present

## 2023-11-03 DIAGNOSIS — T84216A Breakdown (mechanical) of internal fixation device of vertebrae, initial encounter: Secondary | ICD-10-CM | POA: Diagnosis not present

## 2023-11-03 DIAGNOSIS — M4004 Postural kyphosis, thoracic region: Secondary | ICD-10-CM | POA: Diagnosis not present

## 2023-11-14 ENCOUNTER — Other Ambulatory Visit: Payer: Self-pay | Admitting: Neurology

## 2023-11-15 NOTE — Telephone Encounter (Signed)
 Last seen on 09/14/23 Follow up scheduled on 04/16/24   tramadol  50 mg tablet 10/23/2023 30 120 each Sater, Charlie LABOR, MD [facility]   Rx pending to be signed

## 2023-11-16 ENCOUNTER — Encounter (HOSPITAL_BASED_OUTPATIENT_CLINIC_OR_DEPARTMENT_OTHER): Payer: Self-pay | Admitting: Physical Therapy

## 2023-11-21 ENCOUNTER — Ambulatory Visit
Admission: RE | Admit: 2023-11-21 | Discharge: 2023-11-21 | Disposition: A | Discharge status: Home / Self Care | Source: Ambulatory Visit

## 2023-11-21 DIAGNOSIS — Z1231 Encounter for screening mammogram for malignant neoplasm of breast: Secondary | ICD-10-CM

## 2023-12-01 ENCOUNTER — Other Ambulatory Visit: Payer: Self-pay | Admitting: Neurology

## 2023-12-01 NOTE — Telephone Encounter (Signed)
 Last filled by patient on 11/24/23 for a 30 day supply  Last office visit : 09/14/23 next office visit 04/16/24   - too soon to fill

## 2023-12-01 NOTE — Telephone Encounter (Signed)
 Please deny refill request too soon to fill. Last picked up on 11/24/23 for a 30 day supply.

## 2023-12-14 NOTE — Progress Notes (Signed)
 68 y.o. G1P1001 postmenopausal female here with h/o TVH, osteoporosis (managed by PCP), MS with neurogenic bladder for B&P exam. Widowed. PCP: Dwight Trula SQUIBB, MD   She reports no other concerns. Planning for 2nd back surgery with ortho in [place] Dec 2025. Urine sample provided: No  Postmenopausal bleeding: none Pelvic discharge or pain: none Breast mass, nipple discharge or skin changes : none Sexually active: Not currently   Last PAP: No results found for: DIAGPAP, HPVHIGH, ADEQPAP Last mammogram: 11/21/23 density c, birads 1 neg Last DXA: 02/04/21 osteopenia Last colonoscopy: 2021 normal    Exercising: Not currently due to back injury Smoker:No  Flowsheet Row Office Visit from 12/15/2023 in [facility]  PHQ-2 Total Score 0       GYN HISTORY: No sig hx  OB History  Gravida Para Term Preterm AB Living  1 1 1  0 0 1  SAB IAB Ectopic Multiple Live Births  0 0 0 0 1    # Outcome Date GA Lbr Len/2nd Weight Sex Type Anes PTL Lv  1 Term    7 lb 6 oz (3.345 kg)  Vag-Spont   LIV   Past Medical History:  Diagnosis Date   Anemia    Bulging lumbar disc 1980   Chronic pansinusitis    Colon polyp    GERD (gastroesophageal reflux disease)    Gynecological examination    sees Dr. Montie Romine    Hypertension    Multiple sclerosis since 1992   Dr. Charlie Crete in [facility]   Multiple thyroid nodules 1995   per pt. resolved on its own follwed by endo then released   Neurogenic bladder disorder 12/2009   w/ [facility] dx. Dr. McDiarmid   Neuropathy    Osteoarthritis    Osteoporosis    last DEXA 2018   Overactive bladder    sees Dr. Glendia McDiarmid    Pancreatitis 1977   Ulcerative proctitis [facility])    Vision abnormalities    Past Surgical History:  Procedure Laterality Date   APPENDECTOMY     breast ruptured  left   ductal cyst   CARPAL TUNNEL RELEASE Right    CHOLECYSTECTOMY     COLONOSCOPY  01/10/2009   repeat in 5  yrs Dr. Jerrell Sol   CYSTOSCOPY WITH BIOPSY N/A 01/06/2022   Procedure: CYSTOSCOPY WITH BLADDER/URETHRAL BIOPSY;  Surgeon: Alvaro Hummer, MD;  Location: [facility];  Service: Urology;  Laterality: N/A;  45 MINS   DILATION AND CURETTAGE OF UTERUS     LAPAROSCOPY     x 3   left knee surgery  1978   to remove bone tumor     NASAL SINUS SURGERY Bilateral 01/13/2015   Procedure: ENDOSCOPIC SINUS SURGERY;  Surgeon: Ida Loader, MD;  Location: [facility];  Service: ENT;  Laterality: Bilateral;   NASAL SINUS SURGERY  05/2017   NASAL TURBINATE REDUCTION     pubo vaginal sling  10/2003   SPINAL FUSION     T7-S1 2023   TONSILLECTOMY     TOTAL KNEE ARTHROPLASTY Left 03/14/2023   Procedure: TOTAL KNEE ARTHROPLASTY;  Surgeon: Melodi Lerner, MD;  Location: [facility];  Service: Orthopedics;  Laterality: Left;   vaginal cystocele repair     anterior   VAGINAL HYSTERECTOMY  1995   AUB & Endometriosis   widom teeth removal     [facility] Medications on File Prior to Visit  Medication Sig Dispense Refill  acetaminophen  (TYLENOL ) 500 MG tablet Take 500 mg by mouth 4 (four) times daily. (0800, 1200, 1600 & 2000)     amLODipine -valsartan  (EXFORGE ) 5-160 MG tablet Take 1 tablet by mouth at bedtime.     amphetamine -dextroamphetamine  (ADDERALL) 20 MG tablet TAKE 1 TABLET BY MOUTH 2 TIMES DAILY 60 tablet 0   CALCIUM PO Take by mouth.     clonazePAM  (KLONOPIN ) 1 MG tablet TAKE 1 TABLET BY MOUTH 3 TIMES DAILY AS NEEDED 90 tablet 5   COVID-19 mRNA vaccine, Pfizer, (COMIRNATY ) syringe Inject into the muscle. 0.3 mL 0   denosumab  (PROLIA ) 60 MG/ML SOSY injection Inject 60 mg into the skin every 6 (six) months. 1 each 1   DULoxetine  (CYMBALTA ) 60 MG capsule TAKE 1 CAPSULE BY MOUTH EVERY DAY 90 capsule 1   FIBER PO Take by mouth.     finasteride  (PROPECIA ) 1 MG tablet Take 1 mg by mouth daily at 12 noon. (1200)     fluticasone  (FLONASE  ALLERGY RELIEF) 50 MCG/ACT nasal spray Place 2 sprays  into both nostrils at bedtime.     gabapentin  (NEURONTIN ) 600 MG tablet Take 1 tablet (600 mg total) by mouth 4 (four) times daily. 360 tablet 6   leflunomide  (ARAVA ) 20 MG tablet TAKE 1 TABLET BY MOUTH EVERY DAY 90 tablet 1   Multiple Vitamin (MULTI VITAMIN PO) Take by mouth.     Multiple Vitamin (MULTI-VITAMIN) tablet      oxybutynin  (DITROPAN -XL) 10 MG 24 hr tablet TAKE 1 TABLET BY MOUTH EVERY DAY 90 tablet 3   PROTONIX  20 MG tablet Take 20 mg by mouth at bedtime.     traMADol  (ULTRAM ) 50 MG tablet TAKE 1 TABLET BY MOUTH EVERY 6 HOURS AS NEEDED (8am, 12 NOON, AT 4PM AND 8pm) 120 tablet 5   No [facility] facility-administered medications on file prior to visit.   Social History   Socioeconomic History   Marital status: Widowed    Spouse name: Tanda   Number of children: 1   Years of education: 16   Highest education level: Not on file  Occupational History   Occupation: BS Nursing    Employer: UNEMPLOYED   Occupation: [employer] consultant  Tobacco Use   Smoking status: Former    Wareing packs/day: 0.00    Average packs/day: 1 pack/day for 5.0 years (5.0 ttl pk-yrs)    Types: Cigarettes    Start date: 03/08/1974    Quit date: 03/09/1979    Years since quitting: 44.8   Smokeless tobacco: Never  Vaping Use   Vaping status: Never Used  Substance and Sexual Activity   Alcohol use: No   Drug use: No   Sexual activity: Not Currently    Partners: Male    Birth control/protection: Surgical  Other Topics Concern   Not on file  Social History Narrative   Right Handed   2 8oz bottles of Coke   Social Drivers of Health   Financial Resource Strain: Not on file  Food Insecurity: No Food Insecurity (03/14/2023)   Hunger Vital Sign    Worried About Running Out of Food in the Last Year: Never true    Ran Out of Food in the Last Year: Never true  Transportation Needs: No Transportation Needs (03/14/2023)   PRAPARE - Administrator, Civil Service (Medical): No    Lack of  Transportation (Non-Medical): No  Physical Activity: Not on file  Stress: Not on file  Social Connections: Unknown (03/14/2023)   Social Connection and  Isolation Panel    Frequency of Communication with Friends and Family: More than three times a week    Frequency of Social Gatherings with Friends and Family: Once a week    Attends Religious Services: More than 4 times per year    Active Member of [employer] or Organizations: Yes    Attends Banker Meetings: More than 4 times per year    Marital Status: Patient declined  Intimate Partner Violence: Not At Risk (03/14/2023)   Humiliation, Afraid, Rape, and Kick questionnaire    Fear of [place] or Ex-Partner: No    Emotionally Abused: No    Physically Abused: No    Sexually Abused: No   Family History  Problem Relation Age of Onset   Osteoporosis Mother        severe   Cancer Mother 23       colon /Breast also m. niece at 33's   Breast cancer Mother    Osteopenia Sister        X 2   Breast cancer Other    Allergies  Allergen Reactions   Amoxicillin Hives and Itching   Codeine Nausea And Vomiting    vomiting   Demerol [Meperidine] Nausea And Vomiting   Egg Protein-Containing Drug Products Nausea And Vomiting   Ivp Dye [Iodinated Contrast Media] Hives   Penicillins Hives    Tolerated Cephalosporin Date: 03/15/23.     Risedronate  Other (See Comments)    Gi upset   Stadol [Butorphanol] Other (See Comments)    Per patient psychotic event   Sulfonamide Derivatives Hives   Clindamycin /Lincomycin Rash   Dog Epithelium (Canis Lupus Familiaris) Other (See Comments)    Sneezing, cough Cat/ sinus infection and congestion   Gadolinium Derivatives Hives      PE Today's Vitals   12/15/23 1344  BP: 102/60  Pulse: 96  Temp: 98.2 F (36.8 C)  TempSrc: Oral  SpO2: 96%  Weight: 132 lb (59.9 kg)  Height: 5' 4.25 (1.632 m)   Body mass index is 22.48 kg/m.  Physical Exam Vitals reviewed. Exam conducted with a chaperone  present.  Constitutional:      General: She is not in acute distress.    Appearance: Normal appearance.  HENT:     Head: Normocephalic and atraumatic.     Nose: Nose normal.  Eyes:     Extraocular Movements: Extraocular movements intact.     Conjunctiva/sclera: Conjunctivae normal.  Neck:     Thyroid: No thyroid mass, thyromegaly or thyroid tenderness.  Pulmonary:     Effort: Pulmonary effort is normal.  Chest:     Chest wall: No mass or tenderness.  Breasts:    Right: Normal. No swelling, mass, nipple discharge or tenderness.     Left: Normal. No swelling, mass, nipple discharge or tenderness.  Abdominal:     General: There is no distension.     Palpations: Abdomen is soft.     Tenderness: There is no abdominal tenderness.  Genitourinary:    General: Normal vulva.     Exam position: Lithotomy position.     Urethra: No prolapse.     Vagina: Normal. No vaginal discharge or bleeding.     Cervix: No lesion.     Adnexa: Right adnexa normal and left adnexa normal.     Comments: Cervix and uterus absent Musculoskeletal:        General: Normal range of motion.     Cervical back: Normal range of motion.  Lymphadenopathy:  Upper Body:     Right upper body: No axillary adenopathy.     Left upper body: No axillary adenopathy.     Lower Body: No right inguinal adenopathy. No left inguinal adenopathy.  Skin:    General: Skin is warm and dry.  Neurological:     General: No focal deficit present.     Mental Status: She is alert.  Psychiatric:        Mood and Affect: Mood normal.        Behavior: Behavior normal.      Assessment and Plan:        Encounter for breast and pelvic examination Assessment & Plan: Cervical cancer screening performed according to ASCCP guidelines. Encouraged annual mammogram screening Colonoscopy UTD DXA due, will f/u with PCP Labs and immunizations with her primary Encouraged safe sexual practices as indicated Encouraged healthy lifestyle  practices with diet and exercise For patients under 50-70yo, I recommend 1200mg  calcium daily and 600IU of vitamin D  daily.    Age-related osteoporosis without [place] pathological fracture Assessment & Plan: On prolia , managed by PCP, last injection Aug 2025   Screening for depression   Vera LULLA Pa, MD

## 2023-12-15 ENCOUNTER — Ambulatory Visit (INDEPENDENT_AMBULATORY_CARE_PROVIDER_SITE_OTHER): Admitting: Obstetrics and Gynecology

## 2023-12-15 ENCOUNTER — Encounter: Payer: Self-pay | Admitting: Obstetrics and Gynecology

## 2023-12-15 VITALS — BP 102/60 | HR 96 | Temp 98.2°F | Ht 64.25 in | Wt 132.0 lb

## 2023-12-15 DIAGNOSIS — Z1331 Encounter for screening for depression: Secondary | ICD-10-CM | POA: Diagnosis not present

## 2023-12-15 DIAGNOSIS — Z01419 Encounter for gynecological examination (general) (routine) without abnormal findings: Secondary | ICD-10-CM | POA: Insufficient documentation

## 2023-12-15 DIAGNOSIS — M810 Age-related osteoporosis without current pathological fracture: Secondary | ICD-10-CM

## 2023-12-15 NOTE — Assessment & Plan Note (Signed)
 Cervical cancer screening performed according to ASCCP guidelines. Encouraged annual mammogram screening Colonoscopy UTD DXA due, will f/u with PCP Labs and immunizations with her primary Encouraged safe sexual practices as indicated Encouraged healthy lifestyle practices with diet and exercise For patients under 50-70yo, I recommend 1200mg  calcium daily and 600IU of vitamin D  daily.

## 2023-12-15 NOTE — Patient Instructions (Signed)
 For patients under 50-70yo, I recommend 1200mg  calcium  daily and 600IU of vitamin D daily. For patients over 70yo, I recommend 1200mg  calcium  daily and 800IU of vitamin D daily.  Health Maintenance, Female Adopting a healthy lifestyle and getting preventive care are important in promoting health and wellness. Ask your health care provider about: The right schedule for you to have regular tests and exams. Things you can do on your own to prevent diseases and keep yourself healthy. What should I know about diet, weight, and exercise? Eat a healthy diet  Eat a diet that includes plenty of vegetables, fruits, low-fat dairy products, and lean protein. Do not eat a lot of foods that are high in solid fats, added sugars, or sodium. Maintain a healthy weight Body mass index (BMI) is used to identify weight problems. It estimates body fat based on height and weight. Your health care provider can help determine your BMI and help you achieve or maintain a healthy weight. Get regular exercise Get regular exercise. This is one of the most important things you can do for your health. Most adults should: Exercise for at least 150 minutes each week. The exercise should increase your heart rate and make you sweat (moderate-intensity exercise). Do strengthening exercises at least twice a week. This is in addition to the moderate-intensity exercise. Spend less time sitting. Even light physical activity can be beneficial. Watch cholesterol and blood lipids Have your blood tested for lipids and cholesterol at 70 years of age, then have this test every 5 years. Have your cholesterol levels checked more often if: Your lipid or cholesterol levels are high. You are older than 70 years of age. You are at high risk for heart disease. What should I know about cancer screening? Depending on your health history and family history, you may need to have cancer screening at various ages. This may include screening  for: Breast cancer. Cervical cancer. Colorectal cancer. Skin cancer. Lung cancer. What should I know about heart disease, diabetes, and high blood pressure? Blood pressure and heart disease High blood pressure causes heart disease and increases the risk of stroke. This is more likely to develop in people who have high blood pressure readings or are overweight. Have your blood pressure checked: Every 3-5 years if you are 25-57 years of age. Every year if you are 24 years old or older. Diabetes Have regular diabetes screenings. This checks your fasting blood sugar level. Have the screening done: Once every three years after age 62 if you are at a normal weight and have a low risk for diabetes. More often and at a younger age if you are overweight or have a high risk for diabetes. What should I know about preventing infection? Hepatitis B If you have a higher risk for hepatitis B, you should be screened for this virus. Talk with your health care provider to find out if you are at risk for hepatitis B infection. Hepatitis C Testing is recommended for: Everyone born from [birth year] through 1967. Anyone with known risk factors for hepatitis C. Sexually transmitted infections (STIs) Get screened for STIs, including gonorrhea and chlamydia, if: You are sexually active and are younger than 70 years of age. You are older than 70 years of age and your health care provider tells you that you are at risk for this type of infection. Your sexual activity has changed since you were last screened, and you are at increased risk for chlamydia or gonorrhea. Ask your health care provider if  you are at risk. Ask your health care provider about whether you are at high risk for HIV. Your health care provider may recommend a prescription medicine to help prevent HIV infection. If you choose to take medicine to prevent HIV, you should first get tested for HIV. You should then be tested every 3 months for as long as you  are taking the medicine. Osteoporosis and menopause Osteoporosis is a disease in which the bones lose minerals and strength with aging. This can result in bone fractures. If you are 72 years old or older, or if you are at risk for osteoporosis and fractures, ask your health care provider if you should: Be screened for bone loss. Take a calcium  or vitamin D supplement to lower your risk of fractures. Be given hormone replacement therapy (HRT) to treat symptoms of menopause. Follow these instructions at home: Alcohol use Do not drink alcohol if: Your health care provider tells you not to drink. You are pregnant, may be pregnant, or are planning to become pregnant. If you drink alcohol: Limit how much you have to: 0-1 drink a day. Know how much alcohol is in your drink. In the [place] one drink equals one 12 oz bottle of beer (355 mL), one 5 oz glass of wine (148 mL), or one 1 oz glass of hard liquor (44 mL). Lifestyle Do not use any products that contain nicotine or tobacco. These products include cigarettes, chewing tobacco, and vaping devices, such as e-cigarettes. If you need help quitting, ask your health care provider. Do not use street drugs. Do not share needles. Ask your health care provider for help if you need support or information about quitting drugs. General instructions Schedule regular health, dental, and eye exams. Stay current with your vaccines. Tell your health care provider if: You often feel depressed. You have ever been abused or do not feel safe at home. Summary Adopting a healthy lifestyle and getting preventive care are important in promoting health and wellness. Follow your health care provider's instructions about healthy diet, exercising, and getting tested or screened for diseases. Follow your health care provider's instructions on monitoring your cholesterol and blood pressure. This information is not intended to replace advice given to you by your health  care provider. Make sure you discuss any questions you have with your health care provider. Document Revised: 07/14/2020 Document Reviewed: 07/14/2020 Elsevier Patient Education  2024 [employer].

## 2023-12-15 NOTE — Assessment & Plan Note (Signed)
 On prolia , managed by PCP, last injection Aug 2025

## 2023-12-20 DIAGNOSIS — T84216A Breakdown (mechanical) of internal fixation device of vertebrae, initial encounter: Secondary | ICD-10-CM | POA: Diagnosis not present

## 2023-12-22 DIAGNOSIS — G5702 Lesion of sciatic nerve, left lower limb: Secondary | ICD-10-CM | POA: Diagnosis not present

## 2023-12-22 DIAGNOSIS — Z96652 Presence of left artificial knee joint: Secondary | ICD-10-CM | POA: Diagnosis not present

## 2023-12-26 ENCOUNTER — Encounter: Payer: Self-pay | Admitting: Neurology

## 2023-12-26 ENCOUNTER — Other Ambulatory Visit: Payer: Self-pay | Admitting: Neurology

## 2023-12-26 NOTE — Telephone Encounter (Signed)
 Last seen on 09/14/23 Follow up scheduled on 04/16/24   Dispensed Days Supply Quantity Provider Pharmacy  dextroamphetamine -amphetamine  20 mg tablet 11/24/2023 30 60 each Sater, Charlie LABOR, MD [facility]   Rx pending to be signed

## 2023-12-27 ENCOUNTER — Other Ambulatory Visit: Payer: Self-pay | Admitting: Neurology

## 2023-12-27 MED ORDER — DESVENLAFAXINE SUCCINATE ER 100 MG PO TB24: 100.0000 mg | ORAL_TABLET | Freq: Every day | ORAL | 11 refills | Status: DC

## 2024-01-03 ENCOUNTER — Other Ambulatory Visit: Payer: Self-pay | Admitting: Neurology

## 2024-01-03 NOTE — Telephone Encounter (Signed)
 Rx was approved on 12/26/23  Please deny Rx

## 2024-01-25 DIAGNOSIS — I10 Essential (primary) hypertension: Secondary | ICD-10-CM | POA: Diagnosis not present

## 2024-01-25 DIAGNOSIS — G35D Multiple sclerosis, unspecified: Secondary | ICD-10-CM | POA: Diagnosis not present

## 2024-01-25 DIAGNOSIS — F419 Anxiety disorder, unspecified: Secondary | ICD-10-CM | POA: Diagnosis not present

## 2024-01-25 DIAGNOSIS — Z01818 Encounter for other preprocedural examination: Secondary | ICD-10-CM | POA: Diagnosis not present

## 2024-01-26 NOTE — Patient Instructions (Addendum)
 SURGICAL WAITING ROOM VISITATION Patients having surgery or a procedure may have no more than 2 support people in the waiting area - these visitors may rotate in the visitor waiting room.   Children under the age of 42 must have an adult with them who is not the patient. If the patient needs to stay at the hospital during part of their recovery, the visitor guidelines for inpatient rooms apply.   PRE-OP VISITATION  Pre-op nurse will coordinate an appropriate time for 1 support person to accompany the patient in pre-op.  This support person may not rotate.  This visitor will be contacted when the time is appropriate for the visitor to come back in the pre-op area.   Please refer to the [facility] website for the visitor guidelines for Inpatients (after your surgery is over and you are in a regular room).   You are not required to quarantine at this time prior to your surgery. However, you must do this: Hand Hygiene often Do NOT share personal items Notify your provider if you are in close contact with someone who has COVID or you develop fever 100.4 or greater, new onset of sneezing, cough, sore throat, shortness of breath or body aches.  If you test positive for Covid or have been in contact with anyone that has tested positive in the last 10 days please notify you surgeon.     Your procedure is scheduled on:  Monday  January 30, 2024   Report to [facility] Main Entrance: Sheri Burns entrance where the [employer] is available.    Report to admitting at: 12:30   Call this number if you have any questions or problems the morning of surgery [phone]    Do not eat food after Midnight the night prior to your surgery/procedure.  After Midnight you may have the following liquids until    11:45 AM  DAY OF SURGERY  Clear Liquid Diet Water  Black Coffee (sugar ok, NO MILK/CREAM OR CREAMERS)  Tea (sugar ok, NO MILK/CREAM OR CREAMERS) regular and decaf                              Plain Jell-O  with no fruit (NO RED)                                           Fruit ices (not with fruit pulp, NO RED)                                     Popsicles (NO RED)                                                                  Juice: NO CITRUS JUICES: only apple, WHITE grape, WHITE cranberry Sports drinks like Gatorade or Powerade (NO RED)                   The day of surgery:  Drink ONE (1) Pre-Surgery Clear Ensure at  11:45  AM the morning of surgery.  Drink in one sitting. Do not sip.  This drink was given to you during your hospital pre-op appointment visit. Nothing else to drink after completing the Pre-Surgery Clear Ensure : No candy, chewing gum or throat lozenges.      Oral Hygiene is also important to reduce your risk of infection.        Remember - BRUSH YOUR TEETH THE MORNING OF SURGERY WITH YOUR REGULAR TOOTHPASTE   Take ONLY these medicines the morning of surgery with A SIP OF WATER :  Desvenlafaxine  (Pristiq ) Gabapentin ,  Oxybutynin  (Ditropan ) and you may use your Flonase  nasal spray if needed, You may take Clonazepam  if needed.   DO NOT TAKE ADDERALL or Leflunonide (ARAVA ) the morning of your surgery                     You may not have any metal on your body including hair pins, jewelry, and body piercing   Do not wear make-up, lotions, powders, perfumes  or deodorant   Do not wear nail polish including gel and S&S, artificial / acrylic nails, or any other type of covering on natural nails including finger and toenails. If you have artificial nails, gel coating, etc., that needs to be removed by a nail salon, Please have this removed prior to surgery. Not doing so may mean that your surgery could be cancelled or delayed if the Surgeon or anesthesia staff feels like they are unable to monitor you safely.    Do not shave 48 hours prior to surgery to avoid nicks in your skin which may contribute to postoperative infections.      Contacts, Hearing Aids,  dentures or bridgework may not be worn into surgery.    Patients discharged on the day of surgery will not be allowed to drive home.  Someone NEEDS to stay with you for the first 24 hours after anesthesia.   Do not bring your home medications to the hospital. The Pharmacy will dispense medications listed on your medication list to you during your admission in the Hospital.    Please read over the following fact sheets you were given: IF YOU HAVE QUESTIONS ABOUT YOUR PRE-OP INSTRUCTIONS, PLEASE CALL [phone]  (KAY)     [place] - Preparing for Surgery Before surgery, you can play an important role.  Because skin is not sterile, your skin needs to be as free of germs as possible.  You can reduce the number of germs on your skin by washing with CHG (chlorahexidine gluconate) soap before surgery.  CHG is an antiseptic cleaner which kills germs and bonds with the skin to continue killing germs even after washing. Please DO NOT use if you have an allergy to CHG or antibacterial soaps.  If your skin becomes reddened/irritated stop using the CHG and inform your nurse when you arrive at Short Stay. Do not shave (including legs and underarms) for at least 48 hours prior to the first CHG shower.  You may shave your face/neck.   Please follow these instructions carefully:             1.  Shower with CHG Soap the night before surgery and the  morning of surgery.             2.  If you choose to wash your hair, wash your hair first as usual with your normal  shampoo.             3.  After you shampoo, rinse  your hair and body thoroughly to remove the shampoo.                                           4.  Use CHG as you would any other liquid soap.  You can apply chg directly to the skin and wash.  Gently with a scrungie or clean washcloth.             5.  Apply the CHG Soap to your body ONLY FROM THE NECK DOWN.   Do not use on face/ open                           Wound or open sores. Avoid contact with  eyes, ears mouth and genitals (private parts).                       Wash face,  Genitals (private parts) with your normal soap.             6.  Wash thoroughly, paying special attention to the area where your surgery  will be performed.             7.  Thoroughly rinse your body with warm water  from the neck down.             8.  DO NOT shower/wash with your normal soap after using and rinsing off the CHG Soap.            9.  Pat yourself dry with a clean towel.            10.  Wear clean pajamas.            11.  Place clean sheets on your bed the night of your first shower and do not  sleep with pets.   ON THE DAY OF SURGERY : Do not apply any lotions/deodorants the morning of surgery.  Please wear clean clothes to the hospital/surgery center.       FAILURE TO FOLLOW THESE INSTRUCTIONS MAY RESULT IN THE CANCELLATION OF YOUR SURGERY   PATIENT SIGNATURE_________________________________   NURSE SIGNATURE__________________________________   ________________________________________________________________________        Sheri Burns    An incentive spirometer is a tool that can help keep your lungs clear and active. This tool measures how well you are filling your lungs with each breath. Taking long deep breaths may help reverse or decrease the chance of developing breathing (pulmonary) problems (especially infection) following: A long period of time when you are unable to move or be active. BEFORE THE PROCEDURE  If the spirometer includes an indicator to show your best effort, your nurse or respiratory therapist will set it to a desired goal. If possible, sit up straight or lean slightly forward. Try not to slouch. Hold the incentive spirometer in an upright position. INSTRUCTIONS FOR USE  Sit on the edge of your bed if possible, or sit up as far as you can in bed or on a chair. Hold the incentive spirometer in an upright position. Breathe out normally. Place the  mouthpiece in your mouth and seal your lips tightly around it. Breathe in slowly and as deeply as possible, raising the piston or the ball toward the top of the column. Hold your breath for 3-5 seconds or for as long as possible.  Allow the piston or ball to fall to the bottom of the column. Remove the mouthpiece from your mouth and breathe out normally. Rest for a few seconds and repeat Steps 1 through 7 at least 10 times every 1-2 hours when you are awake. Take your time and take a few normal breaths between deep breaths. The spirometer may include an indicator to show your best effort. Use the indicator as a goal to work toward during each repetition. After each set of 10 deep breaths, practice coughing to be sure your lungs are clear. If you have an incision (the cut made at the time of surgery), support your incision when coughing by placing a pillow or rolled up towels firmly against it. Once you are able to get out of bed, walk around indoors and cough well. You may stop using the incentive spirometer when instructed by your caregiver.  RISKS AND COMPLICATIONS Take your time so you do not get dizzy or light-headed. If you are in pain, you may need to take or ask for pain medication before doing incentive spirometry. It is harder to take a deep breath if you are having pain. AFTER USE Rest and breathe slowly and easily. It can be helpful to keep track of a log of your progress. Your caregiver can provide you with a simple table to help with this. If you are using the spirometer at home, follow these instructions: SEEK MEDICAL CARE IF:  You are having difficultly using the spirometer. You have trouble using the spirometer as often as instructed. Your pain medication is not giving enough relief while using the spirometer. You develop fever of 100.5 F (38.1 C) or higher.                                                                                                    SEEK IMMEDIATE MEDICAL  CARE IF:  You cough up bloody sputum that had not been present before. You develop fever of 102 F (38.9 C) or greater. You develop worsening pain at or near the incision site. MAKE SURE YOU:  Understand these instructions. Will watch your condition. Will get help right away if you are not doing well or get worse. Document Released: 07/05/2006 Document Revised: 05/17/2011 Document Reviewed: 09/05/2006 [facility] Patient Information 2014 [place], [place].

## 2024-01-26 NOTE — Progress Notes (Addendum)
 COVID Vaccine received:  []  No [x]  Yes Date of any COVID positive Test in last 90 days: None   PCP - Trula Brim, MD Cardiologist - None Neurology- Charlie Crete, MD   Chest x-ray - 05-05-2021  1v  CEW EKG -  03-08-23   01-25-2024 at [facility]- requested Stress Test - n/a ECHO - n/a Cardiac Cath - n/a   Pacemaker/ICD device     [x]  N/A Spinal Cord Stimulator:[x]  No []  Yes        History of Sleep Apnea? [x]  No []  Yes   CPAP used?- [x]  No []  Yes    Does the patient monitor blood sugar? []  No []  Yes  [x]  N/A   Blood Thinner Instructions:  none Aspirin Instructions:  none   Comments:Allergic: IVP dye and Gadolinium contrast- Hives for both  Patient had CBC Diff and CMP done yesterday, 01-26-24, at her Neurosurgery office in [place]. This lab reports are on her chart. She ONLY had T&S drawn at our PST appt today.    Activity level: Patient can climb a flight of stairs but with some difficulty d/t her MS.  [x]  No CP  [x]  No SOB,  Patient can perform ADLs without assistance.    Anesthesia review: Multiple Sclerosis- ataxic gait, HTN, s/p T7-S1 fusion (Dr. Luke in [place]) 04-2021 - scheduled for hardware removal 02-16-24,  neurogenic bladder, MDD, CPS   Patient denies shortness of breath, fever, cough and chest pain at PAT appointment.   Patient verbalized understanding and agreement to the Pre-Surgical Instructions that were given to them at this PAT appointment. Patient was also educated of the need to review these PAT instructions again prior to her surgery.I reviewed the appropriate phone numbers to call if they have any and questions or concerns.

## 2024-01-27 ENCOUNTER — Encounter (HOSPITAL_COMMUNITY)
Admission: RE | Admit: 2024-01-27 | Discharge: 2024-01-27 | Disposition: A | Discharge status: Home / Self Care | Source: Ambulatory Visit | Attending: Orthopedic Surgery | Admitting: Orthopedic Surgery

## 2024-01-27 ENCOUNTER — Encounter (HOSPITAL_COMMUNITY): Payer: Self-pay

## 2024-01-27 ENCOUNTER — Other Ambulatory Visit: Payer: Self-pay

## 2024-01-27 VITALS — BP 154/72 | HR 82 | Temp 98.6°F | Resp 20 | Ht 64.25 in | Wt 130.0 lb

## 2024-01-27 DIAGNOSIS — Z01812 Encounter for preprocedural laboratory examination: Secondary | ICD-10-CM | POA: Diagnosis not present

## 2024-01-27 DIAGNOSIS — Z79891 Long term (current) use of opiate analgesic: Secondary | ICD-10-CM | POA: Diagnosis not present

## 2024-01-27 DIAGNOSIS — G5732 Lesion of lateral popliteal nerve, left lower limb: Secondary | ICD-10-CM | POA: Insufficient documentation

## 2024-01-27 DIAGNOSIS — G35D Multiple sclerosis, unspecified: Secondary | ICD-10-CM | POA: Insufficient documentation

## 2024-01-27 DIAGNOSIS — Z79899 Other long term (current) drug therapy: Secondary | ICD-10-CM | POA: Diagnosis not present

## 2024-01-27 DIAGNOSIS — Z01818 Encounter for other preprocedural examination: Secondary | ICD-10-CM | POA: Diagnosis not present

## 2024-01-27 HISTORY — DX: Myoneural disorder, unspecified: G70.9

## 2024-01-28 NOTE — Anesthesia Preprocedure Evaluation (Signed)
 Anesthesia Evaluation  Patient identified by MRN, date of birth, ID band Patient awake    Reviewed: Allergy & Precautions, NPO status , Patient's Chart, lab work & pertinent test results  History of Anesthesia Complications Negative for: history of anesthetic complications  Airway Mallampati: III  TM Distance: >3 FB Neck ROM: Full    Dental no notable dental hx. (+) Dental Advisory Given, Teeth Intact   Pulmonary neg pulmonary ROS, former smoker   Pulmonary exam normal breath sounds clear to auscultation       Cardiovascular hypertension, Pt. on medications (-) angina (-) Past MI Normal cardiovascular exam Rhythm:Regular Rate:Normal     Neuro/Psych  Headaches PSYCHIATRIC DISORDERS Anxiety Depression    MS  Neuromuscular disease    GI/Hepatic Neg liver ROS, PUD,GERD  Controlled,,  Endo/Other  negative endocrine ROSneg diabetes    Renal/GU negative Renal ROS Bladder dysfunction Female GU complaint     Musculoskeletal  (+) Arthritis ,  kyphosis and scoliosis s/p PLIF T7-S1 (04/2021)   Abdominal   Peds  Hematology  (+) Blood dyscrasia, anemia Lab Results      Component                Value               Date                      WBC                      8.0                 09/14/2023                HGB                      11.5                09/14/2023                HCT                      35.9                09/14/2023                MCV                      98 (H)              09/14/2023                PLT                      392                 09/14/2023              Anesthesia Other Findings See list  Reproductive/Obstetrics                              Anesthesia Physical Anesthesia Plan  ASA: 3  Anesthesia Plan: General   Post-op Pain Management: Tylenol  PO (pre-op)*   Induction: Intravenous  PONV Risk Score and Plan: 3 and Treatment may vary due to age or medical  condition, Ondansetron , Propofol  infusion and TIVA  Airway Management Planned:  LMA  Additional Equipment: None  Intra-op Plan:   Post-operative Plan: Extubation in OR  Informed Consent: I have reviewed the patients History and Physical, chart, labs and discussed the procedure including the risks, benefits and alternatives for the proposed anesthesia with the patient or authorized representative who has indicated his/her understanding and acceptance.     Dental advisory given  Plan Discussed with: CRNA and Anesthesiologist  Anesthesia Plan Comments:          Anesthesia Quick Evaluation

## 2024-01-30 ENCOUNTER — Ambulatory Visit (HOSPITAL_COMMUNITY): Payer: Self-pay | Admitting: Medical

## 2024-01-30 ENCOUNTER — Other Ambulatory Visit (HOSPITAL_COMMUNITY): Payer: Self-pay

## 2024-01-30 ENCOUNTER — Encounter (HOSPITAL_COMMUNITY): Payer: Self-pay | Admitting: Orthopedic Surgery

## 2024-01-30 ENCOUNTER — Other Ambulatory Visit: Payer: Self-pay

## 2024-01-30 ENCOUNTER — Encounter (HOSPITAL_COMMUNITY)
Admission: RE | Disposition: A | Discharge status: Home / Self Care | Payer: Self-pay | Source: Home / Self Care | Attending: Orthopedic Surgery

## 2024-01-30 ENCOUNTER — Ambulatory Visit (HOSPITAL_COMMUNITY)
Admission: RE | Admit: 2024-01-30 | Discharge: 2024-01-30 | Disposition: A | Discharge status: Home / Self Care | Attending: Orthopedic Surgery | Admitting: Orthopedic Surgery

## 2024-01-30 DIAGNOSIS — G5732 Lesion of lateral popliteal nerve, left lower limb: Secondary | ICD-10-CM | POA: Diagnosis present

## 2024-01-30 DIAGNOSIS — F419 Anxiety disorder, unspecified: Secondary | ICD-10-CM | POA: Diagnosis not present

## 2024-01-30 DIAGNOSIS — Z87891 Personal history of nicotine dependence: Secondary | ICD-10-CM

## 2024-01-30 DIAGNOSIS — M810 Age-related osteoporosis without current pathological fracture: Secondary | ICD-10-CM | POA: Insufficient documentation

## 2024-01-30 DIAGNOSIS — F418 Other specified anxiety disorders: Secondary | ICD-10-CM

## 2024-01-30 DIAGNOSIS — G35D Multiple sclerosis, unspecified: Secondary | ICD-10-CM | POA: Insufficient documentation

## 2024-01-30 DIAGNOSIS — I10 Essential (primary) hypertension: Secondary | ICD-10-CM

## 2024-01-30 DIAGNOSIS — G589 Mononeuropathy, unspecified: Secondary | ICD-10-CM | POA: Insufficient documentation

## 2024-01-30 DIAGNOSIS — Z79899 Other long term (current) drug therapy: Secondary | ICD-10-CM | POA: Insufficient documentation

## 2024-01-30 DIAGNOSIS — K219 Gastro-esophageal reflux disease without esophagitis: Secondary | ICD-10-CM | POA: Diagnosis not present

## 2024-01-30 DIAGNOSIS — F32A Depression, unspecified: Secondary | ICD-10-CM | POA: Insufficient documentation

## 2024-01-30 HISTORY — PX: SUPERFICIAL PERONEAL NERVE RELEASE: SHX6200

## 2024-01-30 LAB — ABO/RH: ABO/RH(D): A NEG

## 2024-01-30 LAB — TYPE AND SCREEN
ABO/RH(D): A NEG
Antibody Screen: NEGATIVE

## 2024-01-30 SURGERY — DECOMPRESSION, NERVE, SUPERFICIAL PERONEAL
Anesthesia: General | Site: Leg Lower | Laterality: Left

## 2024-01-30 MED ORDER — ONDANSETRON HCL 4 MG/2ML IJ SOLN
INTRAMUSCULAR | Status: AC
Start: 2024-01-30 — End: 2024-01-30
  Filled 2024-01-30: qty 2

## 2024-01-30 MED ORDER — LIDOCAINE HCL (CARDIAC) PF 100 MG/5ML IV SOSY
PREFILLED_SYRINGE | INTRAVENOUS | Status: DC | PRN
  Administered 2024-01-30: 100 mg via INTRAVENOUS

## 2024-01-30 MED ORDER — MIDAZOLAM HCL (PF) 2 MG/2ML IJ SOLN: 1.0000 mg | Freq: Once | INTRAMUSCULAR | Status: DC

## 2024-01-30 MED ORDER — CHLORHEXIDINE GLUCONATE 0.12 % MT SOLN
15.0000 mL | Freq: Once | OROMUCOSAL | Status: AC
  Administered 2024-01-30: 15 mL via OROMUCOSAL

## 2024-01-30 MED ORDER — OXYCODONE HCL 5 MG PO TABS
ORAL_TABLET | ORAL | Status: AC
  Filled 2024-01-30: qty 1

## 2024-01-30 MED ORDER — METHOCARBAMOL 500 MG PO TABS
500.0000 mg | ORAL_TABLET | Freq: Four times a day (QID) | ORAL | 0 refills | Status: AC | PRN
  Filled 2024-01-30: qty 40, 10d supply, fill #0

## 2024-01-30 MED ORDER — FENTANYL CITRATE (PF) 50 MCG/ML IJ SOSY: 25.0000 ug | PREFILLED_SYRINGE | INTRAMUSCULAR | Status: DC | PRN

## 2024-01-30 MED ORDER — MIDAZOLAM HCL 2 MG/2ML IJ SOLN
INTRAMUSCULAR | Status: AC
Start: 2024-01-30 — End: 2024-01-30
  Filled 2024-01-30: qty 2

## 2024-01-30 MED ORDER — FENTANYL CITRATE (PF) 100 MCG/2ML IJ SOLN
INTRAMUSCULAR | Status: AC
  Filled 2024-01-30: qty 2

## 2024-01-30 MED ORDER — ROCURONIUM BROMIDE 10 MG/ML (PF) SYRINGE
PREFILLED_SYRINGE | INTRAVENOUS | Status: AC
  Filled 2024-01-30: qty 10

## 2024-01-30 MED ORDER — ACETAMINOPHEN 10 MG/ML IV SOLN: 1000.0000 mg | Freq: Once | INTRAVENOUS | Status: DC | PRN

## 2024-01-30 MED ORDER — SODIUM CHLORIDE 0.9 % IR SOLN
Status: DC | PRN
  Administered 2024-01-30: 1000 mL

## 2024-01-30 MED ORDER — CEFAZOLIN SODIUM-DEXTROSE 2-4 GM/100ML-% IV SOLN
2.0000 g | INTRAVENOUS | Status: AC
  Administered 2024-01-30: 2 g via INTRAVENOUS
  Filled 2024-01-30: qty 100

## 2024-01-30 MED ORDER — FENTANYL CITRATE (PF) 100 MCG/2ML IJ SOLN
INTRAMUSCULAR | Status: DC | PRN
  Administered 2024-01-30: 50 ug via INTRAVENOUS

## 2024-01-30 MED ORDER — ORAL CARE MOUTH RINSE: 15.0000 mL | Freq: Once | OROMUCOSAL | Status: AC

## 2024-01-30 MED ORDER — OXYCODONE HCL 5 MG PO TABS
5.0000 mg | ORAL_TABLET | Freq: Once | ORAL | Status: AC
  Administered 2024-01-30: 5 mg via ORAL

## 2024-01-30 MED ORDER — ONDANSETRON HCL 4 MG/2ML IJ SOLN: 4.0000 mg | Freq: Once | INTRAMUSCULAR | Status: DC | PRN

## 2024-01-30 MED ORDER — FENTANYL CITRATE (PF) 50 MCG/ML IJ SOSY: 50.0000 ug | PREFILLED_SYRINGE | Freq: Once | INTRAMUSCULAR | Status: DC

## 2024-01-30 MED ORDER — ACETAMINOPHEN 10 MG/ML IV SOLN
1000.0000 mg | Freq: Four times a day (QID) | INTRAVENOUS | Status: DC
  Administered 2024-01-30: 1000 mg via INTRAVENOUS
  Filled 2024-01-30: qty 100

## 2024-01-30 MED ORDER — ONDANSETRON HCL 4 MG/2ML IJ SOLN
INTRAMUSCULAR | Status: DC | PRN
  Administered 2024-01-30: 4 mg via INTRAVENOUS

## 2024-01-30 MED ORDER — PROPOFOL 10 MG/ML IV BOLUS
INTRAVENOUS | Status: AC
  Filled 2024-01-30: qty 20

## 2024-01-30 MED ORDER — DEXAMETHASONE SOD PHOSPHATE PF 10 MG/ML IJ SOLN
INTRAMUSCULAR | Status: DC | PRN
  Administered 2024-01-30: 10 mg via INTRAVENOUS

## 2024-01-30 MED ORDER — OXYCODONE HCL 5 MG PO TABS
5.0000 mg | ORAL_TABLET | Freq: Four times a day (QID) | ORAL | 0 refills | Status: AC | PRN
  Filled 2024-01-30: qty 20, 5d supply, fill #0

## 2024-01-30 MED ORDER — PROPOFOL 10 MG/ML IV BOLUS
INTRAVENOUS | Status: DC | PRN
  Administered 2024-01-30: 150 mg via INTRAVENOUS

## 2024-01-30 MED ORDER — MIDAZOLAM HCL 5 MG/5ML IJ SOLN
INTRAMUSCULAR | Status: DC | PRN
  Administered 2024-01-30: 2 mg via INTRAVENOUS

## 2024-01-30 MED ORDER — LACTATED RINGERS IV SOLN: INTRAVENOUS | Status: DC

## 2024-01-30 MED ORDER — LIDOCAINE HCL (PF) 2 % IJ SOLN
INTRAMUSCULAR | Status: AC
  Filled 2024-01-30: qty 5

## 2024-01-30 SURGICAL SUPPLY — 32 items
BAG COUNTER SPONGE SURGICOUNT (BAG) IMPLANT
BANDAGE ESMARK 6X9 LF (GAUZE/BANDAGES/DRESSINGS) ×1 IMPLANT
BNDG ELASTIC 6INX 5YD STR LF (GAUZE/BANDAGES/DRESSINGS) IMPLANT
CUFF TRNQT CYL 24X4X16.5-23 (TOURNIQUET CUFF) IMPLANT
CUFF TRNQT CYL 34X4.125X (TOURNIQUET CUFF) ×1 IMPLANT
DERMABOND ADVANCED .7 DNX12 (GAUZE/BANDAGES/DRESSINGS) IMPLANT
DRAPE INCISE IOBAN 66X45 STRL (DRAPES) ×3 IMPLANT
DRAPE U-SHAPE 47X51 STRL (DRAPES) ×1 IMPLANT
DRSG AQUACEL AG ADV 3.5X10 (GAUZE/BANDAGES/DRESSINGS) IMPLANT
DRSG EMULSION OIL 3X3 NADH (GAUZE/BANDAGES/DRESSINGS) IMPLANT
DURAPREP 26ML APPLICATOR (WOUND CARE) ×1 IMPLANT
ELECT REM PT RETURN 15FT ADLT (MISCELLANEOUS) ×1 IMPLANT
GAUZE SPONGE 4X4 12PLY STRL (GAUZE/BANDAGES/DRESSINGS) ×1 IMPLANT
GLOVE BIO SURGEON STRL SZ 6.5 (GLOVE) ×2 IMPLANT
GLOVE BIO SURGEON STRL SZ7 (GLOVE) ×1 IMPLANT
GLOVE BIO SURGEON STRL SZ8 (GLOVE) ×3 IMPLANT
GLOVE BIOGEL PI IND STRL 6.5 (GLOVE) IMPLANT
GLOVE BIOGEL PI IND STRL 7.0 (GLOVE) ×1 IMPLANT
GLOVE BIOGEL PI IND STRL 7.5 (GLOVE) ×1 IMPLANT
GLOVE BIOGEL PI IND STRL 8 (GLOVE) ×2 IMPLANT
GLOVE SURG SS PI 6.5 STRL IVOR (GLOVE) IMPLANT
GOWN STRL REUS W/ TWL LRG LVL3 (GOWN DISPOSABLE) ×2 IMPLANT
GOWN STRL REUS W/ TWL XL LVL3 (GOWN DISPOSABLE) ×1 IMPLANT
KIT BASIN OR (CUSTOM PROCEDURE TRAY) ×1 IMPLANT
KIT TURNOVER KIT A (KITS) ×1 IMPLANT
NS IRRIG 1000ML POUR BTL (IV SOLUTION) ×1 IMPLANT
PACK TOTAL KNEE CUSTOM (KITS) ×1 IMPLANT
PAD CAST 4YDX4 CTTN HI CHSV (CAST SUPPLIES) ×1 IMPLANT
PADDING CAST COTTON 6X4 STRL (CAST SUPPLIES) ×1 IMPLANT
STRIP CLOSURE SKIN 1/2X4 (GAUZE/BANDAGES/DRESSINGS) IMPLANT
SUT MNCRL AB 4-0 PS2 18 (SUTURE) ×1 IMPLANT
SUT VIC AB 2-0 CT1 TAPERPNT 27 (SUTURE) ×2 IMPLANT

## 2024-01-30 NOTE — H&P (Signed)
 H&P Subjective:  HPI: Sheri Burns, 68 y.o. female is status post left total knee arthroplasty by Dr. Melodi in January 2025. She reports that her knee was functioning well without issues until approximately two months ago when she began experiencing pain. The pain is localized to the left knee and radiates down to the top of her foot. It occurs only when standing and bearing weight, with associated numbness and tingling. The pain is described as severe, rated at 7-8 out of 10. She denies any pain while resting or sitting. She has a history of spinal fusion surgery two years ago, with a broken and displaced rod at L5-S1 on the right side. EMG study was ordered to evaluate this further and came back positive for peroneal entrapment at the left fibular head.   Patient Active Problem List   Diagnosis Date Noted   Encounter for breast and pelvic examination 12/15/2023   Osteoarthritis of left knee 03/14/2023   Vertigo 08/05/2021   Chronic prescription opiate use 12/14/2018   Carpal tunnel syndrome, bilateral 09/13/2017   Sacroiliac joint pain 09/13/2017   Urinary urgency 06/22/2017   Sciatica, right side 06/17/2016   Dysesthesia 10/30/2015   Right carpal tunnel syndrome 10/30/2015   Sciatica of left side 07/16/2015   Chronic ethmoidal sinusitis 06/26/2015   High risk medication use 07/08/2014   Chronic fatigue 04/02/2014   Dysphagia 04/02/2014   Ataxic gait 04/02/2014   Vitamin D  deficiency 06/08/2012   OVERACTIVE BLADDER 08/01/2009   Osteoporosis 08/01/2009   Depression with anxiety 07/04/2007   MULTIPLE SCLEROSIS 07/04/2007   OA (osteoarthritis) of knee 07/04/2007   Headache 07/04/2007   History of colonic polyps 07/04/2007    Past Medical History:  Diagnosis Date   Anemia    Anxiety    Bulging lumbar disc 1980   Chronic pansinusitis    Colon polyp    Depression    GERD (gastroesophageal reflux disease)    Gynecological examination    sees Dr. Montie Romine     Hypertension    Multiple sclerosis since 1992   Dr. Charlie Crete in [facility]   Multiple thyroid nodules 1995   per pt. resolved on its own follwed by endo then released   Neurogenic bladder disorder 12/2009   w/ [facility] dx. Dr. McDiarmid   Neuromuscular disease [facility])    Multiple sclerosis   Neuromuscular disorder (HCC)    neuropathy   Neuropathy    Osteoarthritis    Osteoporosis    last DEXA 2018   Overactive bladder    sees Dr. Glendia McDiarmid    Pancreatitis 1977   Ulcerative proctitis (HCC)    Vision abnormalities     Past Surgical History:  Procedure Laterality Date   APPENDECTOMY     breast ruptured  left   ductal cyst   CARPAL TUNNEL RELEASE Right    CHOLECYSTECTOMY     COLONOSCOPY  01/10/2009   repeat in 5 yrs Dr. Jerrell Sol   CYSTOSCOPY WITH BIOPSY N/A 01/06/2022   Procedure: CYSTOSCOPY WITH BLADDER/URETHRAL BIOPSY;  Surgeon: Alvaro Hummer, MD;  Location: [facility];  Service: Urology;  Laterality: N/A;  45 MINS   DILATION AND CURETTAGE OF UTERUS     EYE SURGERY Bilateral    cataract extraction   LAPAROSCOPY     x 3   left knee surgery  1978   to remove bone tumor     NASAL SINUS SURGERY Bilateral 01/13/2015   Procedure: ENDOSCOPIC SINUS SURGERY;  Surgeon: Ida Loader, MD;  Location: [facility];  Service: ENT;  Laterality: Bilateral;   NASAL SINUS SURGERY  05/2017   NASAL TURBINATE REDUCTION     pubo vaginal sling  10/2003   SPINAL FUSION     T7-S1 2023   TONSILLECTOMY     TOTAL KNEE ARTHROPLASTY Left 03/14/2023   Procedure: TOTAL KNEE ARTHROPLASTY;  Surgeon: Melodi Lerner, MD;  Location: [facility];  Service: Orthopedics;  Laterality: Left;   vaginal cystocele repair     anterior   VAGINAL HYSTERECTOMY  1995   AUB & Endometriosis   widom teeth removal      Prior to Admission medications   Medication Sig Start Date End Date Taking? Authorizing Provider  acetaminophen  (TYLENOL ) 500 MG tablet Take 500 mg by mouth 4 (four)  times daily. (0800, 1200, 1600 & 2000)   Yes [provider]  amLODipine -valsartan  (EXFORGE ) 5-160 MG tablet Take 1 tablet by mouth at bedtime. 12/07/19  Yes [provider]  amphetamine -dextroamphetamine  (ADDERALL) 20 MG tablet TAKE 1 TABLET BY MOUTH 2 TIMES DAILY 01/03/24  Yes Sater, Charlie LABOR, MD  Calcium Carb-Cholecalciferol 600-10 MG-MCG TABS Take 1 tablet by mouth in the morning and at bedtime.   Yes [provider]  carboxymethylcellulose 1 % ophthalmic solution Apply 1 drop to eye 3 (three) times daily as needed (dry eye).   Yes [provider]  clonazePAM  (KLONOPIN ) 1 MG tablet TAKE 1 TABLET BY MOUTH 3 TIMES DAILY AS NEEDED Patient taking differently: Take 1 mg by mouth 3 (three) times daily as needed for anxiety. 08/16/23  Yes Sater, Charlie LABOR, MD  denosumab  (PROLIA ) 60 MG/ML SOSY injection Inject 60 mg into the skin every 6 (six) months. 07/14/17  Yes Cathlyn JAYSON Nikki Bobie FORBES, MD  desvenlafaxine  (PRISTIQ ) 100 MG 24 hr tablet Take 1 tablet (100 mg total) by mouth daily. 12/27/23  Yes Sater, Charlie LABOR, MD  fluticasone  (FLONASE  ALLERGY RELIEF) 50 MCG/ACT nasal spray Place 2 sprays into both nostrils at bedtime.   Yes [provider]  gabapentin  (NEURONTIN ) 600 MG tablet Take 1 tablet (600 mg total) by mouth 4 (four) times daily. 10/24/23  Yes Sater, Charlie LABOR, MD  leflunomide  (ARAVA ) 20 MG tablet TAKE 1 TABLET BY MOUTH EVERY DAY 10/24/23  Yes Sater, Charlie LABOR, MD  Methylcellulose, Laxative, (CITRUCEL) 500 MG TABS Take 1,000 mg by mouth at bedtime.   Yes [provider]  oxybutynin  (DITROPAN -XL) 10 MG 24 hr tablet TAKE 1 TABLET BY MOUTH EVERY DAY 09/02/22  Yes Sater, Charlie LABOR, MD  PROTONIX  20 MG tablet Take 20 mg by mouth at bedtime. 10/01/22  Yes [provider]  traMADol  (ULTRAM ) 50 MG tablet TAKE 1 TABLET BY MOUTH EVERY 6 HOURS AS NEEDED (8am, 12 NOON, AT 4PM AND 8pm) Patient taking differently: Take 50 mg by mouth 4 (four) times  daily. 11/15/23  Yes Sater, Charlie LABOR, MD    Allergies  Allergen Reactions   Ivp Dye [Iodinated Contrast Media] Hives   Penicillins Hives and Nausea Only    Tolerated Cephalosporin Date: 03/15/23.     Risedronate  Other (See Comments)    Gi upset   Stadol [Butorphanol] Other (See Comments)    Per patient psychotic event   Sulfonamide Derivatives Hives   Amoxicillin Hives and Itching   Clindamycin /Lincomycin Rash   Codeine Nausea And Vomiting    vomiting   Demerol [Meperidine] Nausea And Vomiting   Egg Protein-Containing Drug Products Nausea And Vomiting  Gadolinium Derivatives Hives    Social History   Socioeconomic History   Marital status: Widowed    Spouse name: Tanda   Number of children: 1   Years of education: 16   Highest education level: Not on file  Occupational History   Occupation: BS Nursing    Employer: UNEMPLOYED   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  Tobacco Use   Smoking status: Former    [place] packs/day: 0.00    Average packs/day: 1 pack/day for 5.0 years (5.0 ttl pk-yrs)    Types: Cigarettes    Start date: 03/08/1974    Quit date: 03/09/1979    Years since quitting: 44.9   Smokeless tobacco: Never  Vaping Use   Vaping status: Never Used  Substance and Sexual Activity   Alcohol use: No   Drug use: No   Sexual activity: Not Currently    Partners: Male    Birth control/protection: Surgical  Other Topics Concern   Not on file  Social History Narrative   Right Handed   2 8oz bottles of Coke   Social Drivers of Health   Financial Resource Strain: Not on file  Food Insecurity: No Food Insecurity (03/14/2023)   Hunger Vital Sign    Worried About Running Out of Food in the Last Year: Never true    Ran Out of Food in the Last Year: Never true  Transportation Needs: No Transportation Needs (03/14/2023)   PRAPARE - Administrator, Civil Service (Medical): No    Lack of Transportation (Non-Medical): No  Physical Activity: Not on file  Stress:  Not on file  Social Connections: Unknown (03/14/2023)   Social Connection and Isolation Panel    Frequency of Communication with Friends and Family: More than three times a week    Frequency of Social Gatherings with Friends and Family: Once a week    Attends Religious Services: More than 4 times per year    Active Member of [employer] or Organizations: Yes    Attends Banker Meetings: More than 4 times per year    Marital Status: Patient declined  Intimate Partner Violence: Not At Risk (03/14/2023)   Humiliation, Afraid, Rape, and Kick questionnaire    Fear of Wigger or Ex-Partner: No    Emotionally Abused: No    Physically Abused: No    Sexually Abused: No    Tobacco Use: Medium Risk (01/27/2024)   Patient History    Smoking Tobacco Use: Former    Smokeless Tobacco Use: Never    Passive Exposure: Not on file   Social History   Substance and Sexual Activity  Alcohol Use No    Family History  Problem Relation Age of Onset   Osteoporosis Mother        severe   Cancer Mother 51       colon /Breast also m. niece at 57's   Breast cancer Mother    Osteopenia Sister        X 2   Breast cancer Other     Review of Systems  Constitutional:  Negative for chills and fever.  HENT:  Negative for congestion, sore throat and tinnitus.   Eyes:  Negative for double vision, photophobia and pain.  Respiratory:  Negative for cough, shortness of breath and wheezing.   Cardiovascular:  Negative for chest pain, palpitations and orthopnea.  Gastrointestinal:  Negative for heartburn, nausea and vomiting.  Genitourinary:  Negative for dysuria, frequency and urgency.  Musculoskeletal:  Negative for joint pain.  Neurological:  Negative for dizziness, weakness and headaches.    Objective:  Physical Exam: Well-developed female alert and oriented in no apparent distress  - Left knee: No effusion. Range of motion 0-140 degrees. No instability noted. Tenderness around the fibular head.  Palpation of the fibular head reproduces dysesthesias radiating into the foot.  - Motor strength: 5/5 for dorsiflexion and extensor hallucis longus (EHL).  - Sensation: Diminished sensation in the first dorsal web space.  IMAGING:  X-rays of the left knee, AP and lateral views, demonstrate the prosthesis in excellent position with no periprosthetic abnormalities.    Assessment/Plan:  Peroneal nerve compression, left leg  Sheri Burns has peroneal nerve compression at the fibular head. The need for a nerve decompression to improve pain and function, as well as prevent permanent damage was discussed with the patient by Dr. Melodi. The procedure as well as risks/benefits were discussed in detail and she would like to proceed.   Roxie Mess, PA-C Orthopedic Surgery [facility] [place]

## 2024-01-30 NOTE — Op Note (Unsigned)
 NAME: Politte, Mc M. MEDICAL RECORD NO: [id number] ACCOUNT NO: [id number] DATE OF BIRTH: Apr 04, 1955 FACILITY: [facility] LOCATION: [facility] PHYSICIAN: Dempsey GAILS. Ashleigh Luckow, MD  Operative Report   DATE OF PROCEDURE: 01/30/2024  PREOPERATIVE DIAGNOSIS:  Left peroneal nerve compressive neuropathy.  POSTOPERATIVE DIAGNOSIS:  Left peroneal nerve compressive neuropathy.  PROCEDURE PERFORMED:  Left peroneal nerve decompression.  SURGEON:  Dempsey GAILS. Cindy Brindisi, MD  ASSISTANT:  Bari Mess, PA-C  ANESTHESIA:  General.  ESTIMATED BLOOD LOSS:  Less than 10 mL.  DRAINS:  None.  TOURNIQUET TIME:  14 minutes at 300 mmHg.  COMPLICATIONS:  None.  CONDITION:  Stable to recovery.  BRIEF CLINICAL NOTE:  The patient is a 68 year old female had a left total knee arthroplasty done in 03/2023.  She did fantastic and had no complaints whatsoever until about 3 months ago when she started experiencing pain in her left foot.  She also had  some numbness in a deep peroneal nerve distribution.  The pain got progressively worse and I was concerned about a compressive neuropathy of the common peroneal nerve.  We obtained an EMG and nerve conduction study last week, which did show compression  of the nerve at the fibular head.  She presents now for peroneal nerve decompression.  DESCRIPTION OF PROCEDURE:  After the successful administration of general anesthetic, the patient is placed in the right lateral decubitus position with the left side up and held with the hip positioner.  Tourniquet was placed on her left thigh and left  lower extremity prepped and draped in the usual sterile fashion.  Extremity was wrapped in Esmarch tourniquet inflated to 250 mmHg.  The fibular head is palpated left side and the posterior border of the lateral hamstring also palpated.  Curvilinear  incision is made from the fibula head up proximal along the posterior border of the lateral hamstring.  Skin is cut with a 10 blade through  the subcutaneous tissue.  I got to the fascia and incised the fascia.  I was able to find the nerve first  proximally and then trace it down to the fibular head.  Careful dissection is performed to remove the tissue overlying the nerve.  The nerve was obviously flattened as it began to wrap around the fibular neck.  There was a tight band of tissue that was  compressing the nerve.  I used a freer elevator to protect the nerve and then incised that band, which completely freed the nerve up.  I traced the nerve down as it wrapped around the fibular neck into the leg and there was no compression at that point.   We traced back proximally and all tissue overlying the nerve had been dissected.  The tourniquet was then released for a total time of 14 minutes.  All the tissue pinked up nicely as did the nerve.  The wound is copiously irrigated with saline solution  and then the subcutaneous tissue closed with interrupted 2-0 Vicryl and subcuticular closed with a running 4-0 Monocryl.  The incision is cleaned and dried and Dermabond applied.  An Aquacel dressing is placed and she is subsequently awakened and  transported to recovery in stable condition.   PUS D: 01/30/2024 3:06:34 pm T: 01/30/2024 3:18:00 pm  JOB: [id number]/ [id number]

## 2024-01-30 NOTE — Anesthesia Postprocedure Evaluation (Signed)
 Anesthesia Post Note  Patient: Sheri Burns  Procedure(s) Performed: DECOMPRESSION, NERVE, SUPERFICIAL PERONEAL (Left: Leg Lower)     Patient location during evaluation: PACU Anesthesia Type: General Level of consciousness: awake and alert Pain management: pain level controlled Vital Signs Assessment: post-procedure vital signs reviewed and stable Respiratory status: spontaneous breathing, nonlabored ventilation, respiratory function stable and patient connected to nasal cannula oxygen Cardiovascular status: blood pressure returned to baseline and stable Postop Assessment: no apparent nausea or vomiting Anesthetic complications: no   No notable events documented.  Last Vitals:  Vitals:   01/30/24 1547 01/30/24 1554  BP: (!) 178/78   Pulse: 80 80  Resp: 16 (!) 24  Temp:    SpO2: 98% 99%    Last Pain:  Vitals:   01/30/24 1549  TempSrc:   PainSc: (P) 4                  Garnette DELENA Gab

## 2024-01-30 NOTE — Brief Op Note (Signed)
 01/30/2024  3:01 PM  PATIENT:  Ole Norland Lech  68 y.o. female  PRE-OPERATIVE DIAGNOSIS:  Left peroneal nerve compressive neuropathy  POST-OPERATIVE DIAGNOSIS:  * No post-op diagnosis entered *  PROCEDURE:  Procedure(s): DECOMPRESSION, NERVE, SUPERFICIAL PERONEAL (Left)  SURGEON:  Surgeons and Role:    DEWAINE Melodi Lerner, MD - Primary  PHYSICIAN ASSISTANT:   ASSISTANTS: Roxie Mess, PA-C   ANESTHESIA:   general  EBL: 5 ml  BLOOD ADMINISTERED:none  DRAINS: none   LOCAL MEDICATIONS USED:  NONE  COUNTS:  YES  TOURNIQUET:   Total Tourniquet Time Documented: Thigh (Left) - 14 minutes Total: Thigh (Left) - 14 minutes   DICTATION: .Other Dictation: Dictation Number [id number]  PLAN OF CARE: Discharge to home after PACU  PATIENT DISPOSITION:  PACU - hemodynamically stable.

## 2024-01-30 NOTE — Transfer of Care (Signed)
 Immediate Anesthesia Transfer of Care Note  Patient: Sheri Burns  Procedure(s) Performed: DECOMPRESSION, NERVE, SUPERFICIAL PERONEAL (Left: Leg Lower)  Patient Location: PACU  Anesthesia Type:General  Level of Consciousness: awake, alert , and oriented  Airway & Oxygen Therapy: Patient Spontanous Breathing and Patient connected to face mask oxygen  Post-op Assessment: Report given to RN and Post -op Vital signs reviewed and stable  Post vital signs: Reviewed and stable  Last Vitals:  Vitals Value Taken Time  BP 160/87 01/30/24 15:24  Temp    Pulse 81 01/30/24 15:25  Resp 15 01/30/24 15:25  SpO2 100 % 01/30/24 15:25  Vitals shown include unfiled device data.  Last Pain:  Vitals:   01/30/24 1205  TempSrc: Oral  PainSc:          Complications: No notable events documented.

## 2024-01-30 NOTE — Discharge Instructions (Addendum)
 Sheri Moan, MD [facility] [place] [address] [place], [place] [address] [phone]  POSTOPERATIVE INSTRUCTIONS  HOME CARE INSTRUCTIONS  Remove items at home which could result in a fall. This includes throw rugs or furniture in walking pathways.  ICE to the affected knee every three hours for 30 minutes at a time and then as needed for pain and swelling. Continue to use ice on the knee for pain and swelling from surgery. You may notice swelling that will progress down to the foot and ankle. This is normal after surgery. Elevate the leg when you are not up walking on it.   Continue to use the breathing machine which will help keep your temperature down. It is common for your temperature to cycle up and down following surgery, especially at night when you are not up moving around and exerting yourself. The breathing machine keeps your lungs expanded and your temperature down. Sit on high chairs which makes it easier to stand.  Sit on chairs with arms. Use the chair arms to help push yourself up when arising.    DIET You may resume your previous home diet once you are discharged from the hospital.  DRESSING / WOUND CARE / SHOWERING You will have an adhesive waterproof bandage over the incision. Leave this in place until your first follow-up appointment. You may shower three days after surgery, but do not submerge the incision under water .   ACTIVITY Avoid periods of inactivity such as sitting longer than an hour when not asleep. This helps prevent blood clots.  You may resume a sexual relationship in one month or when given the OK by your doctor.  You may return to work once you are cleared by your doctor.  Do not drive a car for 6 weeks or until released by you surgeon.  Do not drive while taking narcotics.  WEIGHT BEARING Weight bearing as tolerated with assist device (walker, cane, etc) as directed, use it until comfortable  ambulating.  POSTOPERATIVE CONSTIPATION PROTOCOL Constipation - defined medically as fewer than three stools per week and severe constipation as less than one stool per week.  One of the most common issues patients have following surgery is constipation.  Even if you have a regular bowel pattern at home, your normal regimen is likely to be disrupted due to multiple reasons following surgery.  Combination of anesthesia, postoperative narcotics, change in appetite and fluid intake all can affect your bowels.  In order to avoid complications following surgery, here are some recommendations in order to help you during your recovery period.  Colace (docusate) - Pick up an over-the-counter form of Colace or another stool softener and take twice a day as long as you are requiring postoperative pain medications.  Take with a full glass of water  daily.  If you experience loose stools or diarrhea, hold the colace until you stool forms back up.  If your symptoms do not get better within 1 week or if they get worse, check with your doctor.  Dulcolax (bisacodyl ) - Pick up over-the-counter and take as directed by the product packaging as needed to assist with the movement of your bowels.  Take with a full glass of water .  Use this product as needed if not relieved by Colace only.   MiraLax  (polyethylene glycol) - Pick up over-the-counter to have on hand.  MiraLax  is a solution that will increase the amount of water  in your bowels to assist with bowel movements.  Take as directed and can mix with a glass of water , juice, soda, coffee, or tea.  Take if you go more than two days without a movement. Do not use MiraLax  more than once per day. Call your doctor if you are still constipated or irregular after using this medication for 7 days in a row.  If you continue to have problems with postoperative constipation, please contact the office for further assistance and recommendations.  If you experience the worst  abdominal pain ever or develop nausea or vomiting, please contact the office immediatly for further recommendations for treatment.  ITCHING  If you experience itching with your medications, try taking only a single pain pill, or even half a pain pill at a time.  You can also use Benadryl  over the counter for itching or also to help with sleep.   TED HOSE STOCKINGS Wear the elastic stockings on both legs for three weeks following surgery during the day but you may remove then at night for sleeping.  MEDICATIONS See your medication summary on the "After Visit Summary" that the nursing staff will review with you prior to discharge.  You may have some home medications which will be placed on hold until you complete the course of blood thinner medication.  It is important for you to complete the blood thinner medication as prescribed by your surgeon.  Continue your approved medications as instructed at time of discharge.  PRECAUTIONS If you experience chest pain or shortness of breath - call 911 immediately for transfer to the hospital emergency department.  If you develop a fever greater that 101 F, purulent drainage from wound, increased redness or drainage from wound, foul odor from the wound/dressing, or calf pain - CONTACT YOUR SURGEON.                                                   FOLLOW-UP APPOINTMENTS Make sure you keep all of your appointments after your operation with your surgeon and caregivers. You should call the office at the above phone number and make an appointment for approximately two weeks after the date of your surgery or on the date instructed by your surgeon outlined in the After Visit Summary.   Pick up stool softner and laxative for home use following surgery while on pain medications. Do not submerge incision under water . Please use good hand washing techniques while changing dressing each day. May shower starting three days after surgery. Please use a clean towel  to pat the incision dry following showers. Continue to use ice for pain and swelling after surgery. Do not use any lotions or creams on the incision until instructed by your surgeon.

## 2024-01-30 NOTE — Anesthesia Procedure Notes (Signed)
 Procedure Name: LMA Insertion Date/Time: 01/30/2024 2:17 PM  Performed by: Charo Philipp, Corean BROCKS, CRNAPre-anesthesia Checklist: Patient identified, Emergency Drugs available, Suction available and Patient being monitored Patient Re-evaluated:Patient Re-evaluated prior to induction Oxygen Delivery Method: Circle system utilized Preoxygenation: Pre-oxygenation with 100% oxygen Induction Type: IV induction Ventilation: Mask ventilation without difficulty LMA: LMA inserted LMA Size: 4.0 Number of attempts: 1 Airway Equipment and Method: Bite block Placement Confirmation: positive ETCO2 Tube secured with: Tape Dental Injury: Teeth and Oropharynx as per pre-operative assessment

## 2024-01-30 NOTE — Interval H&P Note (Signed)
 History and Physical Interval Note:  01/30/2024 1:10 PM  Sheri Burns  has presented today for surgery, with the diagnosis of Left peroneal nerve compressive neuropathy.  The various methods of treatment have been discussed with the patient and family. After consideration of risks, benefits and other options for treatment, the patient has consented to  Procedure(s): DECOMPRESSION, NERVE, SUPERFICIAL PERONEAL (Left) as a surgical intervention.  The patient's history has been reviewed, patient examined, no change in status, stable for surgery.  I have reviewed the patient's chart and labs.  Questions were answered to the patient's satisfaction.     [name]

## 2024-01-31 ENCOUNTER — Encounter: Payer: Self-pay | Admitting: Neurology

## 2024-01-31 ENCOUNTER — Encounter (HOSPITAL_COMMUNITY): Payer: Self-pay | Admitting: Orthopedic Surgery

## 2024-01-31 ENCOUNTER — Other Ambulatory Visit: Payer: Self-pay | Admitting: Neurology

## 2024-01-31 MED ORDER — SERTRALINE HCL 100 MG PO TABS: 100.0000 mg | ORAL_TABLET | Freq: Every day | ORAL | 5 refills | Status: DC

## 2024-02-08 ENCOUNTER — Other Ambulatory Visit: Payer: Self-pay | Admitting: Neurology

## 2024-02-08 NOTE — Telephone Encounter (Signed)
 Requested Prescriptions   Pending Prescriptions Disp Refills   amphetamine -dextroamphetamine  (ADDERALL) 20 MG tablet [Pharmacy Med Name: dextroamphetamine -amphetamine  20 mg tablet] 60 tablet 0    Sig: TAKE 1 TABLET BY MOUTH 2 TIMES DAILY   Last appt 09/14/23 Next appt 04/16/24  Dispenses   Dispensed Days Supply Quantity Provider Pharmacy  dextroamphetamine -amphetamine  20 mg tablet 01/26/2024 30 60 each Sater, Charlie LABOR, MD [facility]  dextroamphetamine -amphetamine  20 mg tablet 12/29/2023 30 60 each Sater, Charlie LABOR, MD [facility]...  dextroamphetamine -amphetamine  20 mg tablet 11/24/2023 30 60 each Sater, Charlie LABOR, MD [facility]...  dextroamphetamine -amphetamine  20 mg tablet 10/21/2023 30 60 each Sater, Charlie LABOR, MD [facility]...  dextroamphetamine -amphetamine  20 mg tablet 09/21/2023 30 60 each Sater, Charlie LABOR, MD [facility]...  dextroamphetamine -amphetamine  20 mg tablet 08/23/2023 30 60 each Athar, Saima, MD [facility]...  dextroamphetamine -amphetamine  20 mg tablet 07/25/2023 30 60 each Sater, Charlie LABOR, MD [facility]...  dextroamphetamine -amphetamine  20 mg tablet 06/27/2023 30 60 each Sater, Charlie LABOR, MD [facility]...  dextroamphetamine -amphetamine  20 mg tablet 05/27/2023 30 60 each Dohmeier, Dedra, MD [facility]...  dextroamphetamine -amphetamine  20 mg tablet 04/29/2023 30 60 each Sater, Charlie LABOR, MD [facility]  dextroamphetamine -amphetamine  20 mg tablet 04/01/2023 30 60 each Sater, Charlie LABOR, MD [facility]...  dextroamphetamine -amphetamine  20 mg tablet 02/24/2023 30 60 each Sater, Charlie LABOR, MD [email or web address]

## 2024-02-15 ENCOUNTER — Other Ambulatory Visit: Payer: Self-pay | Admitting: Neurology

## 2024-02-15 NOTE — Telephone Encounter (Signed)
 Last seen on 09/14/23 Follow up scheduled on 04/16/24    Dispensed Days Supply Quantity Provider Pharmacy  clonazepam  1 mg tablet 01/26/2024 30 90 each Sater, Charlie LABOR, MD [facility]     Rx pending to be signed

## 2024-02-17 NOTE — Progress Notes (Addendum)
 Case Management Quick Note Patient Information: DOB:[birth year] Gender:female Admission Date: 02/16/2024  Discharge Barriers Discharge Barriers Identified  : No Barriers Identified   Post Acute Placement Status: Referral sent and pending status update from locations.     Notes: PT and OT have reco'd acute rehab, CCM sent IRF referral to [facility] via [facility]. CCM also called Rehab to talk w/ Admissions and left VM about pt. Auth needed for rehab once pt has a bed available.  3:47pm [facility] called back, [place] ([phone]) w/ Admissions asked for referral info to be faxed to [phone], CCM faxed the info via [facility].   Anticipated Discharge Location: Inpatient Rehabilitation Facility         Assessment Completed by: Madelin Like, RN

## 2024-02-17 NOTE — Progress Notes (Signed)
 Acute Physical Therapy Evaluation  ASSESSMENT SUMMARY   ASSESSMENT:  Physical therapy orders received. The patients chart was reviewed, and the patient was assessed.  Patient is a 68 y.o. female admitted on 02/16/2024 for medical diagnosis of s/p T3-pelvis extension revision. . Patients prior level of function was Requiring assistance with Home Management prior to admission.  The patient is presenting today below baseline secondary to decreased activity tolerance, balance deficits, and pain limiting function.  Patient's functional impairments include bed mobility, transfers, and ambulation. This session, the patient's highest level of mobility performed was Standing, requiring Contact Guard Assist. Mobility was most limited by pain, nausea, and dec activity tolerance. The patient scored [facility] of 19 with 36.99% disability. . Based on clinical findings, patient will benefit from skilled physical therapy services to address stated impairments, with goal of improving activity limitations and overall function. . At this time, the patient will benefit from discharge to acute rehab when medically ready.    Goals for next session: progress ambulation    PT Recommendation: PT Recommendation: Can tolerate 3 hrs of therapy, Rehabilitation Facility PT Equipment Recommended: Walker-rolling, None   Home Living Environment: Type of Home House  Home Layout One level  Exterior Stairs - number 0  Exterior Stairs - rails    Interior Stairs - number 0  Interior Stairs - Administrator, Sports / Tub    Scientist, Physiological, Cane-single point  Equipment Currently Using none  Additional Comments     Prior Level of Function: Level of Independence Requiring assistance  Lives With Alone  Person(s) able to assist at d/c housekeeper comes for home management, daughter intermittently  Patient Responsibilities Meal Preparation, Manage Medications,  Personal ADLs  Requires Assist With Home Management   Fall in the last year?: Yes Fall/Injury Details: 3 at beach 2/2 leg weakness        PERTINENT MEDICAL INFORMATION  PT Treatment Diagnosis:    Difficulty Walking (not elsewhere specified)  Precautions: Other Therapy Precautions: Fall risk, Spinal  Past Medical History: Medical History[1]  Past Surgical History: Surgical History[2]  Reason for Admission:  No chief complaint on file.   Medical Diagnosis/Course: PT Medical Dx: s/p T3-pelvis extension revision      Problem List: Problem List[3]   SUBJECTIVE  Communication: Communication: Nursing, Clinical Case Management, OT Communication Details: Communicated findings   General: General Family/Caregiver Present: daughter    OBJECTIVE  Pain: Pain Assessment Pain Assessment: 0-10 Pain Score  : 10 Pain Type: Acute pain, Surgical pain Pain Location: Back Pain Descriptors: Aching  Cognition: Orientation Level: Oriented x4  Range of Motion: Overall ROM Assessment: Within Functional Limits  Strength: Strength Assessment Overall Strength Assessment: Within Functional Limits   Condition After Therapy:  Back to bed, Nursing notified of condition, All needs within reach, Family present in room   INTERVENTIONS   Skilled PT Treatment Provided  Balance: Sitting - Static: Close supervision Sitting - Dynamic: Close supervision Standing - Static: Contact Guard Assistance Standing - Dynamic: Chiropodist Additional Comments: RW in standing  Functional Mobility:  Bed Mobility Rolling: Contact Guard Assistance, Rolling - Right, Rolling - Left, Extra time, Verbal Cues, Tactile Cues Supine to Sit: [place], Extra time, Verbal Cues, Tactile cues Sit to Supine: [place], Extra time, Verbal Cues, Tactile cues Bed Mobility Additional Comments: HOB flat, cues for log roll technique with good carryover. ++ pain  and nausea  when coming to EOB. Transfers Assistive Device: Walker-rolling Sit to Stand: [place], [employer], Tactile cues, Extra time Stand to Sit: [place], Verbal Cues, Tactile cues, Extra time Transfers  Additional Comments: Completed x1 STS during session with bed elevated, cues for foot placement on ground and hand placement on AD. Able to tolerate standing ~5min but requesting to sit back down 2/2 inc pain and nausea with movement.  Additional Therapeutic Interventions: Therapeutic Exercise:  Exercise: Strengthening - Extremity: LLE RLE  - Reps: 10 - Assist: Close supervision - Position: Supine.  Comment:  ankle pumps, quad sets, glute sets, heel slides  Educated pt on: -Role of PT in acute setting, PT POC, and dispo recs -AD height adjustments and proper fitting -Spinal precautions: no bending, lifting, or twisting, log roll technique out of bed    Skill Performed by Clinician:  Education on proper use of assistive device/adaptive equipment Education on home program to improve safety and performance of functional tasks Education on precautions for safe performance of functional tasks Education on safe/proper technique Facilitation for proper positioning/movement in preparation for functional tasks Monitoring exercise/activity tolerance Monitoring safe progression of exercise/activity Tactile cues for proper technique Tactile cues for safe execution of functional tasks Verbal cues for proper technique Verbal cues for safe execution of functional tasks  Response to Interventions:  Demonstrates carryover of treatment strategies Improved Balance Improved Functional Mobility Improved strength Improved use of adaptive equipment Pain increased Required therapeutic rest breaks   PERFORMANCE OUTCOME MEASURES    [facility] - Highest Level Mobility JH-HLM Score - Goal: 6 JH-HLM Score - Performed/Achieved: Standing (1 or more  minutes) JH-HLM Score - Performed/Achieved Goal (Y/N): No  AM-PAC - Basic Mobility:    Flowsheet Row Most Recent Value  AM-PAC 6-Clicks - Basic Mobility  Turning from you back to your side while in a flat bed without using bed rails? None  Moving from lying on your back to sitting on the side of a flat bed without using bed rails? A little  Moving to and from a bed to a chair (including a wheelchair)? A little  Standing up from a chair using your arms (e.g, wheelchair, or bedside chair)? A little  To walk in a hospital room? A little  Climbing 3-5 steps with a railing? A little  AM-PAC Total Score 19   PT PLAN OF CARE  Rehab Potential:  Rehab Potential: Good Complexity/Co-morbidities that Impact POC: Pain, Severity of condition, Time since onset/Exacerbation Impairments/Limitations: Activity Tolerance Deficits, Ambulation Deficits, Balance Deficits, Pain limiting function, Mobility deficits  Plan:  Planned Treatment/Interventions: Patient/Caregiver education, Therapeutic activity, Therapeutic exercise, Gait/mobility training, Neuromuscular re-education PT Frequency: 5x week PT Duration: For 2 weeks PT Re-eval Due: 03/02/24  PT Goals:  Encounter Problems     Encounter Problems (Active)     Physical Therapy     Patient will perform sit-to-stand with close supervision using Walker-rolling (Progressing)     Start:  02/17/24    Expected End:  03/02/24             Patient will ambulate 50 feet with close supervision using Walker-rolling     Start:  02/17/24    Expected End:  03/02/24         Patient/Caregiver will demonstrate spinal precautions independently (Progressing)     Start:  02/17/24    Expected End:  03/02/24                 TREATMENT TIME  Time  In: 0757 Time Out: 0831  Total Treatment Minutes: 34 min Timed Charges and Total Timed Code Treatment Minutes:  Therapeutic Activity minutes: 14 PT Therapeutic Exercise minutes: 10 PT Timed Minutes:  24   Electronically Signed: Almarie Camp, PT Date: 02/17/2024       [1] Past Medical History: Diagnosis Date   Anxiety    Depression    Hypertension    Controlled with medication. Managed by PCP   Kyphosis    MS (multiple sclerosis) (CMD)    Diagnosed 68yrs ago; relapsing remitting; Dr. Vear   Neurogenic bladder    MS   Osteoporosis    Poor balance    MS   Scoliosis   [2] Past Surgical History: Procedure Laterality Date   CARPAL TUNNEL RELEASE Right    CATARACT EXTRACTION     CHOLECYSTECTOMY     PARTIAL HYSTERECTOMY     Age 46   REPLACEMENT TOTAL KNEE     SINUS SURGERY     x2  [3] Patient Active Problem List Diagnosis   Scoliosis   Neurogenic bladder   Multiple sclerosis (CMD)   Hypertension   Kyphosis deformity of spine   Fever   Acute encephalopathy   Sinus headache   UTI (urinary tract infection)   Hypokalemia   Low TSH level   Orthostasis   Loose stools   Wound drainage   Osteoporosis   Former smoker, stopped smoking in distant past   Adjacent segment disease of thoracic spine with history of fusion procedure

## 2024-02-17 NOTE — Unmapped External Note (Signed)
 Acute Occupational Therapy Evaluation    ASSESSMENT SUMMARY   Patient is a 68 y.o. female admitted for medical diagnosis of Adjacent segment disease of thoracic spine with history of fusion procedure. Patients prior level of function was Independent Requiring assistance with: Home Management  prior to admission.  The patient is presenting today below baseline secondary to decreased activity tolerance, range of motion deficits , balance deficits, muscle weakness, impaired sensation, pain limiting function, sensory deficits, fine motor deficits, and gross motor deficits.  Patient's functional impairments include bed mobility, transfers, stairs, grooming, upper body bathing, lower body bathing, upper body dressing , lower body dressing, toileting, and instrumental activities of daily living . This session, the patient was able to perform sit EOB and standing at sink with Contact Guard Assist and was most limited by dizziness, nausea and decreased activity tolerance. The patient scored [place] ADL AMPAC of 15 indicating 56% disability rating.  Based on clinical findings,Patient will benefit from skilled occupational therapy services to address stated impairments, with goal of improving activity limitations and overall function.  . At this time, the patient will benefit from discharge to rehab when medically ready.  Goals for next session: OOB mobility, sinkside ADL, toilet transfer    OT Recommendation: OT Recommendation: Rehabilitation Facility, Can tolerate 3 hrs of therapy OT Equipment Recommended: Defer to next level of care    Home Living Environment: Type of Home House  Home Layout One level  Exterior Stairs - Research Scientist (life Sciences) Stairs - number 0  Interior Stairs - Psychologist, Sport And Exercise - number 0  Shower/Tub    Automotive Engineer bars in shower, Paediatric nurse, Psychologist, prison and probation services, Raised toilet height  Psychologist, Clinical, Cane-single point   Equipment Currently Using none  Additional Comments     Prior Level of Function:  Level of Independence Independent  Lives With Alone  Person(s) able to assist at d/c housekeeper comes for home management, daughter intermittently  Patient Responsibilities  Meal Preparation, Manage Medications, Personal ADLs, Social Participation, Dentist, Shopping  Requires Assist With  Home Management   Fall in the last year?: Yes Fall/Injury Details: 5 in the past year 2/2 impaired balance  PROBLEM LIST  OT Treatment Diagnosis:    Needing assistance w/ self-care  Precautions: Other Therapy Precautions: Fall risk, Spinal   Past Medical History: Medical History[1]  Past Surgical History: Surgical History[2]   SUBJECTIVE  Communication: Communication: Patient, Nursing, Family/Caregiver Communication Details: RN cleared pt for session, updated after at bedside  General: General Family/Caregiver Details: daughter present at bedside Patient subjective: This is my second back surgery Hand Dominance: Right  OBJECTIVE   Pain: Pain Assessment Pain Assessment: 0-10 Pain Score  : 8 Pain Location: Back Pain Intervention(s): Distraction, Repositioned  Cognition:  Orientation Level: Oriented x4 Overall Cognitive Status: Within Functional Limits Comments: Pt pleasant and cooperative, follows all commands and responds appropriately. Pt recalls BLT spinal precautions and log roll technique independently when cued.  ROM:  ROM Assessment Overall ROM Assessment: Bilateral UEs WFL ROM Additional Comments: BUE ~90* shoulder abduction 2/2 pain  Strength:  Strength Assessment Overall Strength Assessment: Bilateral UEs WFL Strength Comments: BUE 5/5 grossly with exception of deltoids not assessed due to pain  Tone: Tone Assessment Tone Assessment: Within Functional Limits  Sensation: Sensation Parasthesia: reports numbness/tingling in BLE  Coordination:   Coordination Additional Comments: B finger opposition intact  Vision/Perception and Hearing: Vision/Perception and Hearing  [place] Vision: No  visual deficits Vision Screen: Within Functional Limits Hearing: Within Functional Limits  Therapy Vitals: VSS   INTERVENTIONS   Skilled OT Treatment Provided  Balance:  Sitting - Static: Close supervision Sitting - Dynamic: Close supervision Standing - Static: Contact Guard Assistance Standing - Dynamic: Contact Guard Assistance Balance Additional Comments: RW in standing, no LOB. Maintains sittting balance EOB ~ 10 mins while waiting for nausea to settle  Bed Mobility:  Bed Mobility Supine to Sit: Minimal Assistance Sit to Supine: Minimal Assistance Bed Mobility Additional Comments: HOB flat, pt maintains spinal precautions without cuing. MinA to suit EOB for trunk management, MinA to return to supine for BLE management  Transfers: Transfers Assistive Device: Walker-rolling Sit to Stand: Geophysicist/field Seismologist Stand to Sit: [employer] to/from Chair - Transfer Type: Stand Step Bed to/from Chair - Level of Assistance: Geophysicist/field Seismologist Transfers  Additional Comments: Pt completes 1x STS with CGA for steadying and RW. No LOB or overt unsteadiness, however pt c/o increased nausea and dizziness.  Mobility: Mobility Gait Distance (ft): 6 ft Gait Assistance: Scientist, Physiological Device: Walker-rolling OT Mobility Additional Comments: Pt ambulates forward 3 feet to stand sinkside before ambulating backwards 3 feet back to bed due to nausea.  ADLs: ADLs Feeding: Set up (setup tray retrieval) Grooming: Set up (Attempts oral care sinkside but unable to complete due to nausea. Pt declines continuation on EOB) Bathing: Moderate Assistance UB Dressing: Minimal Assistance (Pt declines dressing ADls due to nausea) LB Dressing: Moderate Assistance Toileting: Maximal Assistance ADL Additional  Comments: ADLS without comment based on clinical judgement Assistive Device: Walker-rolling  Additional Therapeutic Interventions: OT provided education on to patient/caregiver on the following topics:  Role of OT, OT POC and OT D/C recommendations , Call don't fall policy and emphasized to use call bell and have staff assist with OOB mobility or to obtain objects that are out of reach to prevent falls in hospital, Spinal precautions , and Safe use of AD   - SPINAL PRECAUTIONS: A handout was provided to patient at beginning of session for reference.  -No Bending:Try not to bend over, bend backwards, or bend to the side. Have someone pick up things off the floor for you. You can also use a reacher. Ask your therapists where you can buy a reacher.  - No Lifting more than 5 to 10 pounds: As an example, a gallon of milk weighs 9 pounds.  - No Twisting: Do not reach across your body too far or twist to look behind you without moving your feet.    - LOG ROLL TECHNIQUE 1. Bend your knees. Scoot your body to the edge of the bed. 2. With your knees bent, log roll your whole body to the side. 3. At the same time, lower your legs down and push with your arms to sit up 4. Carefully place your feet on the floor and stand up by pushing from the edge of the bed     Skill Performed by Clinician: Education on proper use of assistive device/adaptive equipment , Education on precautions for safe performance of functional tasks , Education to improve safety with functional tasks , Facilitation for proper positioning/movement in preparation for functional tasks , Facilitation for safe movement and performance of functional tasks , Monitoring exercise/activity tolerance , Monitoring safe progression of exercise/activity , Verbal cues for pacing of functional tasks , Verbal cues for proper technique , and Verbal cues for safe execution of functional tasks   Response to  Interventions: Improved Activity tolerance ,  Improved Functional Mobility , and Required therapeutic rest breaks   Condition After Therapy: Condition After Therapy: Back to bed, Nursing notified of condition, All needs within reach, Alarm activated, Fall interventions in place, Family present in room, Lines intact  PERFORMANCE OUTCOME MEASURES   JH-HLM Score - Performed/Achieved: Standing (1 or more minutes)  AM-PAC - Daily Activity:  Flowsheet Row Most Recent Value  AM-PAC 6-Clicks - Daily Activity  Lower Body Clothing Activity A lot  Bathing A lot  Toileting A lot  Upper Body Clothing Activity A little  Personal Grooming A little  Eating Meals A little  AM-PAC Total Score  15    OT PLAN OF CARE  Rehab Potential: Rehab Potential Rehab Potential: Good Impairments/Limitations: Activity Tolerance Deficits, Range of motion deficits, Balance Deficits, Gross Motor deficits, Basic Activity of Daily living deficits, IADL deficits, Sensory deficits, Mobility deficits, Coordination Deficits, Muscle weakness, Pain limiting function  Plan: Plan Treatment Plan/Goals Established with Patient/Caregiver: Yes Planned Treatment/Interventions: Basic activities of daily living, Instrumental Activities of Daily Living, Patient/Caregiver education, Therapeutic activities, Therapeutic exercises, Energy Conservation training, Neuromuscular reeducation, Functional Mobility training, Pain management, Equipment training OT Frequency: 5x week OT Duration: For 2 weeks OT Re-eval Due: 03/02/24  OT Goals: Encounter Problems     Encounter Problems (Active)     Occupational Therapy     Patient will perform bathing Independently with shower chair     Start:  02/17/24    Expected End:  03/02/24         Patient will perform grooming with set up sitting/standing at sink with LRAD      Start:  02/17/24    Expected End:  03/02/24         Patient will perform upper body dressing Independently     Start:  02/17/24    Expected End:  03/02/24          Patient will perform lower body dressing Independently     Start:  02/17/24    Expected End:  03/02/24         Patient will perform toileting Independently     Start:  02/17/24    Expected End:  03/02/24             Patient will perform toilet transfer Independently to Toilet     Start:  02/17/24    Expected End:  03/02/24                 TREATMENT TIME  Time In: 1321 Time Out: 1359 Total Timed Code Minutes: 23 Total Treatment Time Minutes: 38  OT Eval Low Complexity minutes: 15   Treatment/Procedures Time Entry Therapeutic Activity minutes: 23   Electronically Signed: Louisa Abate, OT/L Date: 02/17/2024       [1] Past Medical History: Diagnosis Date   Anxiety    Depression    Hypertension    Controlled with medication. Managed by PCP   Kyphosis    MS (multiple sclerosis) (CMD)    Diagnosed 22yrs ago; relapsing remitting; Dr. Vear   Neurogenic bladder    MS   Osteoporosis    Poor balance    MS   Scoliosis   [2] Past Surgical History: Procedure Laterality Date   CARPAL TUNNEL RELEASE Right    CATARACT EXTRACTION     CHOLECYSTECTOMY     PARTIAL HYSTERECTOMY     Age 61   REPLACEMENT TOTAL KNEE     SINUS SURGERY  x2

## 2024-02-17 NOTE — Progress Notes (Signed)
 Case Management Initial Assessment Note Patient Information: DOB:1955/07/29 Gender:female Admission Date: 02/16/2024 Primary Care Provider: Trula SHAUNNA Brim, MD Preferred Pharmacy: [facility] [place], [place] - [address] - PHONE: [phone] - FAX: [phone]   Unplanned Readmission Score:  10.32  Assessment discussed with:  Name: Nykayla, Marcelli Relationship to Patient: self, daughter Date: Fri 02/17/2024  Assessment conducted via: Bedside  Payor Verification: Payor verified and no changes needed   Extended Emergency Contact Information Primary Emergency Contact: [facility] Address: [address]          [place], [place] [address] [place] Mobile Phone: [phone] Relation: Other Secondary Emergency Contact: Valente Ronal Millman Mobile Phone: [phone] Relation: Daughter Guardian Information: n/a  [place] Agencies or Facilities:  ,  ,    Anticipated Discharge Plan: Pt admitted s/p T3-pelvis extension revision. Currently has a drain which should come out in a couple days. CMA done at bedside w/ A&O pt and daughter, Ronal Millman. Pt lives at home alone but has family support, expecting to go to acute rehab after this admission. Pt wants to go to [facility] at [facility] in [place]. No HH/HHI/DME prior to admit. Verified demographics, Rx and PCP, no issue with these. Pt will likely need ambulance or w/c transport to acute rehab at d/c.  Next Steps: CCM will continue to follow, if PT/OT evals reco acute rehab then CCM will start referral in [facility]. Pt has BlueMedicare so will need Auth for acute rehab.   Anticipated Discharge Location: Inpatient Rehabilitation Facility  If Plan A discharging location is not feasible: Potential Plan B: Home with Home Health  Patient Choice Information: Patient Choice provided: Provided and reviewed by patient/family and selection has been made.      Living Assessment: Discharge Planning Home  Living  Assessment  Lives With: Alone Home Support or Services : Independent/No support Barriers at Home : Stairs Stairs assessment : Outside Outside Stair Number: 1  Home Social Assessment: In anticipation of future discharge needs the patient/caregiver indicates: Do you have a way to obtain medications after you leave the hospital? : Yes Do you have a way to pay for medications after you leave the hospital? : Yes Do you have a good supply of food at home? : Yes Do you have a ride to doctors appointment after you leave the hospital? : Yes  Readmission Assessment:                                            Anticipated Discharge Needs Estimated Date of Discharge: Feb 20, 2024     Home Support or Services : Independent/No support Can be cared for in same environment prior to hospitalization: Yes            Discharge Barriers Discharge Barriers Identified  : Insurance pending  Assessment Completed by: Madelin Like, RN

## 2024-02-20 ENCOUNTER — Other Ambulatory Visit: Payer: Self-pay | Admitting: Neurology

## 2024-02-20 DIAGNOSIS — F418 Other specified anxiety disorders: Secondary | ICD-10-CM | POA: Diagnosis not present

## 2024-02-20 DIAGNOSIS — M5134 Other intervertebral disc degeneration, thoracic region: Secondary | ICD-10-CM | POA: Diagnosis not present

## 2024-02-20 DIAGNOSIS — M810 Age-related osteoporosis without current pathological fracture: Secondary | ICD-10-CM | POA: Diagnosis not present

## 2024-02-22 NOTE — Telephone Encounter (Signed)
 Last seen on 09/14/23 Follow up scheduled on 04/16/24

## 2024-03-15 ENCOUNTER — Other Ambulatory Visit: Payer: Self-pay | Admitting: Neurology

## 2024-03-15 NOTE — Telephone Encounter (Signed)
 Last seen on 09/14/23 Follow up scheduled on 04/16/24

## 2024-03-16 ENCOUNTER — Telehealth: Payer: Self-pay | Admitting: Neurology

## 2024-03-16 NOTE — Telephone Encounter (Signed)
 Patient called to reschedule appointment due to scheduling conflict with post op appointment

## 2024-03-23 ENCOUNTER — Encounter: Payer: Self-pay | Admitting: Neurology

## 2024-03-26 ENCOUNTER — Other Ambulatory Visit: Payer: Self-pay | Admitting: Neurology

## 2024-03-26 MED ORDER — SERTRALINE HCL 100 MG PO TABS: ORAL_TABLET | ORAL | 5 refills | Status: AC

## 2024-04-06 ENCOUNTER — Other Ambulatory Visit: Payer: Self-pay | Admitting: Neurology

## 2024-04-09 NOTE — Telephone Encounter (Signed)
 Last seen 09/14/23 Next appt 04/23/25 Dispenses   Dispensed Days Supply Quantity Provider Pharmacy  dextroamphetamine -amphetamine  20 mg tablet 03/06/2024 30 60 each Sater, Charlie LABOR, MD [facility]  dextroamphetamine -amphetamine  20 mg tablet 01/26/2024 30 60 each Sater, Charlie LABOR, MD [facility]  dextroamphetamine -amphetamine  20 mg tablet 12/29/2023 30 60 each Sater, Charlie LABOR, MD [facility]  dextroamphetamine -amphetamine  20 mg tablet 11/24/2023 30 60 each Sater, Charlie LABOR, MD [facility]  dextroamphetamine -amphetamine  20 mg tablet 10/21/2023 30 60 each Sater, Charlie LABOR, MD [facility]  dextroamphetamine -amphetamine  20 mg tablet 09/21/2023 30 60 each Sater, Charlie LABOR, MD [facility]  dextroamphetamine -amphetamine  20 mg tablet 08/23/2023 30 60 each Athar, Saima, MD [facility]  dextroamphetamine -amphetamine  20 mg tablet 07/25/2023 30 60 each Sater, Charlie LABOR, MD [facility]  dextroamphetamine -amphetamine  20 mg tablet 06/27/2023 30 60 each Sater, Charlie LABOR, MD [facility]  dextroamphetamine -amphetamine  20 mg tablet 05/27/2023 30 60 each Dohmeier, Dedra, MD [facility]  dextroamphetamine -amphetamine  20 mg tablet 04/29/2023 30 60 each Sater, Charlie LABOR, MD [email or web address]

## 2024-04-16 ENCOUNTER — Ambulatory Visit: Admitting: Family Medicine

## 2025-04-23 ENCOUNTER — Ambulatory Visit: Admitting: Family Medicine
